# Patient Record
Sex: Female | Born: 1945 | Race: White | Hispanic: No | Marital: Married | Smoking: Never smoker
Health system: Southern US, Community
[De-identification: ages and names within clinical notes are randomized; demographics above are authoritative.]

## PROBLEM LIST (undated history)

## (undated) DIAGNOSIS — R42 Dizziness and giddiness: Secondary | ICD-10-CM

## (undated) DIAGNOSIS — K219 Gastro-esophageal reflux disease without esophagitis: Secondary | ICD-10-CM

## (undated) DIAGNOSIS — E669 Obesity, unspecified: Secondary | ICD-10-CM

## (undated) DIAGNOSIS — J40 Bronchitis, not specified as acute or chronic: Secondary | ICD-10-CM

## (undated) DIAGNOSIS — G4733 Obstructive sleep apnea (adult) (pediatric): Secondary | ICD-10-CM

## (undated) DIAGNOSIS — I219 Acute myocardial infarction, unspecified: Secondary | ICD-10-CM

## (undated) DIAGNOSIS — IMO0001 Reserved for inherently not codable concepts without codable children: Secondary | ICD-10-CM

## (undated) DIAGNOSIS — E785 Hyperlipidemia, unspecified: Secondary | ICD-10-CM

## (undated) DIAGNOSIS — K759 Inflammatory liver disease, unspecified: Secondary | ICD-10-CM

## (undated) DIAGNOSIS — L408 Other psoriasis: Secondary | ICD-10-CM

## (undated) DIAGNOSIS — R17 Unspecified jaundice: Secondary | ICD-10-CM

## (undated) DIAGNOSIS — K5790 Diverticulosis of intestine, part unspecified, without perforation or abscess without bleeding: Secondary | ICD-10-CM

## (undated) DIAGNOSIS — I10 Essential (primary) hypertension: Secondary | ICD-10-CM

## (undated) DIAGNOSIS — G4730 Sleep apnea, unspecified: Secondary | ICD-10-CM

## (undated) DIAGNOSIS — K649 Unspecified hemorrhoids: Secondary | ICD-10-CM

## (undated) DIAGNOSIS — Z9289 Personal history of other medical treatment: Secondary | ICD-10-CM

## (undated) DIAGNOSIS — I4719 Other supraventricular tachycardia: Secondary | ICD-10-CM

## (undated) DIAGNOSIS — H9190 Unspecified hearing loss, unspecified ear: Secondary | ICD-10-CM

## (undated) DIAGNOSIS — I2510 Atherosclerotic heart disease of native coronary artery without angina pectoris: Secondary | ICD-10-CM

## (undated) DIAGNOSIS — F32A Depression, unspecified: Secondary | ICD-10-CM

## (undated) DIAGNOSIS — M1990 Unspecified osteoarthritis, unspecified site: Secondary | ICD-10-CM

## (undated) DIAGNOSIS — R011 Cardiac murmur, unspecified: Secondary | ICD-10-CM

## (undated) DIAGNOSIS — I471 Supraventricular tachycardia: Secondary | ICD-10-CM

## (undated) HISTORY — DX: Diverticulosis of intestine, part unspecified, without perforation or abscess without bleeding: K57.90

## (undated) HISTORY — DX: Unspecified osteoarthritis, unspecified site: M19.90

## (undated) HISTORY — DX: Obesity, unspecified: E66.9

## (undated) HISTORY — PX: ABDOMINAL HYSTERECTOMY: SHX81

## (undated) HISTORY — DX: Hyperlipidemia, unspecified: E78.5

## (undated) HISTORY — DX: Other psoriasis: L40.8

## (undated) HISTORY — DX: Sleep apnea, unspecified: G47.30

## (undated) HISTORY — DX: Depression, unspecified: F32.A

## (undated) HISTORY — DX: Bronchitis, not specified as acute or chronic: J40

## (undated) HISTORY — DX: Dizziness and giddiness: R42

## (undated) HISTORY — DX: Cardiac murmur, unspecified: R01.1

## (undated) HISTORY — PX: TUBAL LIGATION: SHX77

## (undated) HISTORY — PX: BACK SURGERY: SHX140

## (undated) HISTORY — PX: UPPER GASTROINTESTINAL ENDOSCOPY: SHX188

## (undated) HISTORY — DX: Gastro-esophageal reflux disease without esophagitis: K21.9

## (undated) HISTORY — PX: KNEE SURGERY: SHX244

## (undated) HISTORY — PX: CORONARY ANGIOPLASTY: SHX604

## (undated) HISTORY — PX: COLONOSCOPY: SHX174

## (undated) HISTORY — DX: Essential (primary) hypertension: I10

## (undated) HISTORY — PX: REPAIR RECTOCELE: SUR1206

## (undated) HISTORY — DX: Unspecified jaundice: R17

## (undated) HISTORY — DX: Unspecified hemorrhoids: K64.9

## (undated) HISTORY — DX: Personal history of other medical treatment: Z92.89

## (undated) HISTORY — DX: Atherosclerotic heart disease of native coronary artery without angina pectoris: I25.10

## (undated) HISTORY — PX: CATARACT EXTRACTION: SUR2

## (undated) HISTORY — DX: Obstructive sleep apnea (adult) (pediatric): G47.33

## (undated) SURGERY — SUPRAVENTRICULAR TACHYCARDIA ABLATION
Anesthesia: Moderate Sedation | Laterality: Bilateral

---

## 1999-07-25 ENCOUNTER — Encounter: Admission: RE | Admit: 1999-07-25 | Discharge: 1999-07-25 | Payer: Self-pay | Admitting: Obstetrics and Gynecology

## 1999-07-25 ENCOUNTER — Encounter: Payer: Self-pay | Admitting: Obstetrics and Gynecology

## 2000-09-03 ENCOUNTER — Encounter: Payer: Self-pay | Admitting: Obstetrics and Gynecology

## 2000-09-03 ENCOUNTER — Other Ambulatory Visit: Admission: RE | Admit: 2000-09-03 | Discharge: 2000-09-03 | Payer: Self-pay | Admitting: Obstetrics and Gynecology

## 2000-09-03 ENCOUNTER — Encounter: Admission: RE | Admit: 2000-09-03 | Discharge: 2000-09-03 | Payer: Self-pay | Admitting: Obstetrics and Gynecology

## 2001-09-30 ENCOUNTER — Encounter: Admission: RE | Admit: 2001-09-30 | Discharge: 2001-09-30 | Payer: Self-pay | Admitting: Obstetrics and Gynecology

## 2001-09-30 ENCOUNTER — Encounter: Payer: Self-pay | Admitting: Obstetrics and Gynecology

## 2001-10-07 ENCOUNTER — Other Ambulatory Visit: Admission: RE | Admit: 2001-10-07 | Discharge: 2001-10-07 | Payer: Self-pay | Admitting: Obstetrics and Gynecology

## 2002-10-13 ENCOUNTER — Encounter: Payer: Self-pay | Admitting: Obstetrics and Gynecology

## 2002-10-13 ENCOUNTER — Encounter: Admission: RE | Admit: 2002-10-13 | Discharge: 2002-10-13 | Payer: Self-pay | Admitting: Obstetrics and Gynecology

## 2003-10-19 ENCOUNTER — Encounter: Admission: RE | Admit: 2003-10-19 | Discharge: 2003-10-19 | Payer: Self-pay | Admitting: Obstetrics and Gynecology

## 2004-02-09 ENCOUNTER — Inpatient Hospital Stay (HOSPITAL_COMMUNITY): Admission: EM | Admit: 2004-02-09 | Discharge: 2004-02-11 | Payer: Self-pay | Admitting: Emergency Medicine

## 2004-05-23 ENCOUNTER — Ambulatory Visit: Payer: Self-pay | Admitting: Family Medicine

## 2004-07-27 ENCOUNTER — Ambulatory Visit: Payer: Self-pay | Admitting: Family Medicine

## 2005-04-05 ENCOUNTER — Ambulatory Visit: Payer: Self-pay | Admitting: Family Medicine

## 2005-05-28 ENCOUNTER — Ambulatory Visit: Payer: Self-pay | Admitting: Internal Medicine

## 2005-10-29 ENCOUNTER — Encounter: Admission: RE | Admit: 2005-10-29 | Discharge: 2005-10-29 | Payer: Self-pay | Admitting: Obstetrics and Gynecology

## 2006-11-21 ENCOUNTER — Ambulatory Visit: Payer: Self-pay | Admitting: Internal Medicine

## 2006-11-25 ENCOUNTER — Encounter: Admission: RE | Admit: 2006-11-25 | Discharge: 2006-11-25 | Payer: Self-pay | Admitting: Obstetrics and Gynecology

## 2006-11-28 ENCOUNTER — Ambulatory Visit: Payer: Self-pay | Admitting: Internal Medicine

## 2007-11-25 ENCOUNTER — Encounter: Payer: Self-pay | Admitting: Internal Medicine

## 2008-02-23 ENCOUNTER — Encounter: Admission: RE | Admit: 2008-02-23 | Discharge: 2008-02-23 | Payer: Self-pay | Admitting: Obstetrics and Gynecology

## 2008-03-22 ENCOUNTER — Inpatient Hospital Stay (HOSPITAL_COMMUNITY): Admission: RE | Admit: 2008-03-22 | Discharge: 2008-03-26 | Payer: Self-pay | Admitting: Orthopedic Surgery

## 2008-04-19 ENCOUNTER — Encounter: Admission: RE | Admit: 2008-04-19 | Discharge: 2008-06-10 | Payer: Self-pay | Admitting: Orthopedic Surgery

## 2008-05-25 ENCOUNTER — Ambulatory Visit: Payer: Self-pay | Admitting: Cardiovascular Disease

## 2008-07-02 ENCOUNTER — Ambulatory Visit: Payer: Self-pay | Admitting: Cardiovascular Disease

## 2008-07-02 LAB — CONVERTED CEMR LAB
Basophils Relative: 0.8 % (ref 0.0–3.0)
Calcium: 8.9 mg/dL (ref 8.4–10.5)
Eosinophils Absolute: 0.2 10*3/uL (ref 0.0–0.7)
Eosinophils Relative: 3.6 % (ref 0.0–5.0)
GFR calc Af Amer: 109 mL/min
GFR calc non Af Amer: 90 mL/min
Glucose, Bld: 138 mg/dL — ABNORMAL HIGH (ref 70–99)
HCT: 38 % (ref 36.0–46.0)
Hemoglobin: 13.1 g/dL (ref 12.0–15.0)
INR: 1 (ref 0.8–1.0)
Monocytes Absolute: 0.3 10*3/uL (ref 0.1–1.0)
Monocytes Relative: 6.2 % (ref 3.0–12.0)
Neutro Abs: 3 10*3/uL (ref 1.4–7.7)
Neutrophils Relative %: 57.7 % (ref 43.0–77.0)
Potassium: 3.3 meq/L — ABNORMAL LOW (ref 3.5–5.1)
Prothrombin Time: 11.2 s (ref 10.9–13.3)
RBC: 4.11 M/uL (ref 3.87–5.11)
Sodium: 141 meq/L (ref 135–145)
WBC: 5.1 10*3/uL (ref 4.5–10.5)

## 2008-07-12 ENCOUNTER — Ambulatory Visit: Payer: Self-pay | Admitting: Cardiovascular Disease

## 2008-07-12 LAB — CONVERTED CEMR LAB
CO2: 31 meq/L (ref 19–32)
Calcium: 9.3 mg/dL (ref 8.4–10.5)
Chloride: 106 meq/L (ref 96–112)
Creatinine, Ser: 0.5 mg/dL (ref 0.4–1.2)
GFR calc non Af Amer: 132 mL/min
Sodium: 142 meq/L (ref 135–145)

## 2008-10-18 ENCOUNTER — Encounter: Payer: Self-pay | Admitting: Internal Medicine

## 2008-12-09 ENCOUNTER — Observation Stay (HOSPITAL_COMMUNITY): Admission: EM | Admit: 2008-12-09 | Discharge: 2008-12-13 | Payer: Self-pay | Admitting: Emergency Medicine

## 2008-12-09 ENCOUNTER — Ambulatory Visit: Payer: Self-pay | Admitting: Internal Medicine

## 2008-12-09 DIAGNOSIS — B379 Candidiasis, unspecified: Secondary | ICD-10-CM | POA: Insufficient documentation

## 2008-12-09 DIAGNOSIS — E785 Hyperlipidemia, unspecified: Secondary | ICD-10-CM | POA: Insufficient documentation

## 2008-12-09 DIAGNOSIS — I152 Hypertension secondary to endocrine disorders: Secondary | ICD-10-CM | POA: Insufficient documentation

## 2008-12-09 DIAGNOSIS — R011 Cardiac murmur, unspecified: Secondary | ICD-10-CM | POA: Insufficient documentation

## 2008-12-09 DIAGNOSIS — R42 Dizziness and giddiness: Secondary | ICD-10-CM | POA: Insufficient documentation

## 2008-12-09 DIAGNOSIS — I10 Essential (primary) hypertension: Secondary | ICD-10-CM

## 2008-12-09 DIAGNOSIS — K649 Unspecified hemorrhoids: Secondary | ICD-10-CM | POA: Insufficient documentation

## 2008-12-09 DIAGNOSIS — R55 Syncope and collapse: Secondary | ICD-10-CM | POA: Insufficient documentation

## 2008-12-09 DIAGNOSIS — I2510 Atherosclerotic heart disease of native coronary artery without angina pectoris: Secondary | ICD-10-CM | POA: Insufficient documentation

## 2008-12-09 DIAGNOSIS — J40 Bronchitis, not specified as acute or chronic: Secondary | ICD-10-CM | POA: Insufficient documentation

## 2008-12-09 DIAGNOSIS — M1990 Unspecified osteoarthritis, unspecified site: Secondary | ICD-10-CM | POA: Insufficient documentation

## 2008-12-09 DIAGNOSIS — L408 Other psoriasis: Secondary | ICD-10-CM | POA: Insufficient documentation

## 2008-12-09 DIAGNOSIS — R17 Unspecified jaundice: Secondary | ICD-10-CM | POA: Insufficient documentation

## 2008-12-09 DIAGNOSIS — E1159 Type 2 diabetes mellitus with other circulatory complications: Secondary | ICD-10-CM | POA: Insufficient documentation

## 2008-12-09 DIAGNOSIS — K219 Gastro-esophageal reflux disease without esophagitis: Secondary | ICD-10-CM | POA: Insufficient documentation

## 2008-12-09 HISTORY — DX: Dizziness and giddiness: R42

## 2008-12-10 ENCOUNTER — Encounter: Payer: Self-pay | Admitting: Cardiovascular Disease

## 2008-12-13 ENCOUNTER — Ambulatory Visit: Payer: Self-pay | Admitting: Vascular Surgery

## 2008-12-13 ENCOUNTER — Encounter: Payer: Self-pay | Admitting: Cardiovascular Disease

## 2008-12-13 ENCOUNTER — Ambulatory Visit: Payer: Self-pay | Admitting: Internal Medicine

## 2008-12-17 ENCOUNTER — Telehealth: Payer: Self-pay | Admitting: Internal Medicine

## 2008-12-22 ENCOUNTER — Ambulatory Visit: Payer: Self-pay | Admitting: Internal Medicine

## 2008-12-22 ENCOUNTER — Encounter: Payer: Self-pay | Admitting: Cardiology

## 2009-01-31 ENCOUNTER — Encounter: Payer: Self-pay | Admitting: Internal Medicine

## 2009-02-21 ENCOUNTER — Encounter: Payer: Self-pay | Admitting: Internal Medicine

## 2009-02-28 ENCOUNTER — Ambulatory Visit: Payer: Self-pay | Admitting: Internal Medicine

## 2009-02-28 DIAGNOSIS — R609 Edema, unspecified: Secondary | ICD-10-CM

## 2009-02-28 DIAGNOSIS — I10 Essential (primary) hypertension: Secondary | ICD-10-CM | POA: Insufficient documentation

## 2009-02-28 HISTORY — DX: Edema, unspecified: R60.9

## 2009-03-07 ENCOUNTER — Ambulatory Visit: Payer: Self-pay | Admitting: Internal Medicine

## 2009-03-07 ENCOUNTER — Encounter: Payer: Self-pay | Admitting: Internal Medicine

## 2009-03-07 LAB — CONVERTED CEMR LAB
CO2: 28 meq/L (ref 19–32)
Calcium: 9.7 mg/dL (ref 8.4–10.5)
Chloride: 109 meq/L (ref 96–112)

## 2009-03-14 ENCOUNTER — Telehealth: Payer: Self-pay | Admitting: Internal Medicine

## 2009-03-21 ENCOUNTER — Encounter: Admission: RE | Admit: 2009-03-21 | Discharge: 2009-03-21 | Payer: Self-pay | Admitting: Obstetrics and Gynecology

## 2009-04-18 ENCOUNTER — Encounter: Payer: Self-pay | Admitting: Internal Medicine

## 2009-04-29 ENCOUNTER — Telehealth (INDEPENDENT_AMBULATORY_CARE_PROVIDER_SITE_OTHER): Payer: Self-pay | Admitting: *Deleted

## 2009-09-06 ENCOUNTER — Emergency Department (HOSPITAL_COMMUNITY): Admission: EM | Admit: 2009-09-06 | Discharge: 2009-09-07 | Payer: Self-pay | Admitting: Emergency Medicine

## 2009-09-12 ENCOUNTER — Encounter: Payer: Self-pay | Admitting: Internal Medicine

## 2009-09-12 ENCOUNTER — Telehealth (INDEPENDENT_AMBULATORY_CARE_PROVIDER_SITE_OTHER): Payer: Self-pay | Admitting: *Deleted

## 2009-09-14 ENCOUNTER — Telehealth: Payer: Self-pay | Admitting: Internal Medicine

## 2009-09-15 ENCOUNTER — Ambulatory Visit (HOSPITAL_COMMUNITY): Admission: RE | Admit: 2009-09-15 | Discharge: 2009-09-17 | Payer: Self-pay | Admitting: Orthopedic Surgery

## 2009-10-26 ENCOUNTER — Encounter: Admission: RE | Admit: 2009-10-26 | Discharge: 2010-01-24 | Payer: Self-pay | Admitting: Orthopedic Surgery

## 2009-11-21 ENCOUNTER — Telehealth (INDEPENDENT_AMBULATORY_CARE_PROVIDER_SITE_OTHER): Payer: Self-pay | Admitting: *Deleted

## 2010-01-09 ENCOUNTER — Encounter: Payer: Self-pay | Admitting: Internal Medicine

## 2010-02-06 ENCOUNTER — Encounter: Payer: Self-pay | Admitting: Internal Medicine

## 2010-02-21 ENCOUNTER — Ambulatory Visit: Payer: Self-pay | Admitting: Internal Medicine

## 2010-02-21 DIAGNOSIS — R0602 Shortness of breath: Secondary | ICD-10-CM | POA: Insufficient documentation

## 2010-02-23 ENCOUNTER — Telehealth (INDEPENDENT_AMBULATORY_CARE_PROVIDER_SITE_OTHER): Payer: Self-pay | Admitting: Radiology

## 2010-02-27 ENCOUNTER — Ambulatory Visit: Payer: Self-pay | Admitting: Internal Medicine

## 2010-02-27 ENCOUNTER — Encounter (HOSPITAL_COMMUNITY): Admission: RE | Admit: 2010-02-27 | Discharge: 2010-04-11 | Payer: Self-pay | Admitting: Internal Medicine

## 2010-02-27 ENCOUNTER — Encounter: Payer: Self-pay | Admitting: Internal Medicine

## 2010-02-27 ENCOUNTER — Ambulatory Visit: Payer: Self-pay

## 2010-03-15 ENCOUNTER — Ambulatory Visit: Payer: Self-pay | Admitting: Pulmonary Disease

## 2010-03-15 DIAGNOSIS — G4733 Obstructive sleep apnea (adult) (pediatric): Secondary | ICD-10-CM | POA: Insufficient documentation

## 2010-03-20 ENCOUNTER — Ambulatory Visit (HOSPITAL_BASED_OUTPATIENT_CLINIC_OR_DEPARTMENT_OTHER): Admission: RE | Admit: 2010-03-20 | Discharge: 2010-03-20 | Payer: Self-pay | Admitting: Pulmonary Disease

## 2010-03-20 ENCOUNTER — Encounter: Payer: Self-pay | Admitting: Pulmonary Disease

## 2010-03-22 ENCOUNTER — Encounter: Admission: RE | Admit: 2010-03-22 | Discharge: 2010-03-22 | Payer: Self-pay | Admitting: Obstetrics and Gynecology

## 2010-03-24 ENCOUNTER — Encounter: Admission: RE | Admit: 2010-03-24 | Discharge: 2010-03-24 | Payer: Self-pay | Admitting: Obstetrics and Gynecology

## 2010-04-06 ENCOUNTER — Ambulatory Visit: Payer: Self-pay | Admitting: Pulmonary Disease

## 2010-04-13 ENCOUNTER — Ambulatory Visit: Payer: Self-pay | Admitting: Pulmonary Disease

## 2010-07-27 ENCOUNTER — Encounter: Payer: Self-pay | Admitting: Internal Medicine

## 2010-08-22 NOTE — Assessment & Plan Note (Signed)
Summary: f79m/jss   Visit Type:  Follow-up Primary Provider:  Meryle Ready  CC:  no complaints.  History of Present Illness: 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  She returns for routine f/u. Says she is doing pretty well. Notes that she as been having some exertional dyspnea for past 3-4 month which she feels is somewhat worse from before. No CP or tightness.  No orthopnea, PND or edema. lost 23 pounds over past year.  Still taking Plavix. Bruising a lot.   Not exercising regularly. Snores excessively and wakes her self up. Very fatigued.  TC 163 LDL 97 HDL 44 TG 65 Lp (a) very high at 20 pro-BNP 95   Current Medications (verified): 1)  Plavix 75 Mg Tabs (Clopidogrel Bisulfate) .... Take One Tablet By Mouth Daily 2)  Aspirin 81 Mg Tbec (Aspirin) .... Take One Tablet By Mouth Daily 3)  Crestor 20 Mg Tabs (Rosuvastatin Calcium) .... Take One Tablet By Mouth Daily. 4)  Fish Oil   Oil (Fish Oil) .... 2 Tabs Once Daily 5)  Calcium Carbonate   Powd (Calcium Carbonate) .... With Vit D Once Daily 6)  Nitroglycerin 0.4 Mg Subl (Nitroglycerin) .... Place 1 Tablet Under Tongue As Directed 7)  Metoprolol Tartrate 25 Mg Tabs (Metoprolol Tartrate) .... 1/2 By Mouth Two Times A Day 8)  Klor-Con M10 10 Meq Cr-Tabs (Potassium Chloride Crys Cr) .... Take 1 Tablet By Mouth Once A Day 9)  Spironolactone 25 Mg Tabs (Spironolactone) .... Take One Tablet By Mouth Daily 10)  Lisinopril 20 Mg Tabs (Lisinopril) .... Take One  Tablet By Mouth Daily  Allergies (verified): 1)  ! Procardia 2)  ! Codeine  Past History:  Past Medical History: 1. Hypertension. 2. Hyperlipidemia. 3. Gastroesophageal reflux disease. 4. Coronary artery disease     catheterization 10/09 revealing significant stenoses in the LAD.     Subsequent stent placement with 3 XIENCE drug-eluting stents in the  proximal, mid, and distal LAD.  The proximal LAD had a 3.5- x 8-mm     XIENCE stent placed, the mid LAD had a 3.0- x 8-mm XIENCE stent     placed, and the distal LAD had a 2.75- x 15-mm XIENCE stent placed 5. Syncope - evaluated by Dr. Ladona Ridgel (EP)      --work-up negative. ? neuro-cardiogenic 6. Morbid obesity MEASLES, HX OF (ICD-V12.09) CANDIDIASIS (ICD-112.9) PSORIASIS (ICD-696.1) ARTHRITIS (ICD-716.90) HEMORRHOIDS (ICD-455.6) JAUNDICE (ICD-782.4) DIVERTICULOSIS OF COLON (ICD-562.10) BRONCHITIS (ICD-490) VERTIGO (ICD-780.4) GERD (ICD-530.81)  Review of Systems       As per HPI and past medical history; otherwise all systems negative.   Vital Signs:  Patient profile:   65 year old female Height:      65 inches Weight:      226 pounds BMI:     37.74 O2 Sat:      95 % on Room air Pulse rate:   60 / minute BP sitting:   122 / 74  (right arm) Cuff size:   regular  Vitals Entered By: Hardin Negus, RMA (February 21, 2010 11:16 AM)  O2 Flow:  Room air CC: no complaints Comments 2 laps around office O2 stats up to 98 with HR of 96   Physical Exam  General:  Well appearing. no resp difficulty HEENT: normal Neck: supple. no JVD. Carotids 2+ bilat; no bruits. No lymphadenopathy or thryomegaly appreciated. Cor:  PMI nondisplaced. Regular rate & rhythm. No rubs, gallops. soft systolic murmur at RSB. s2 crisp Lungs: clear Abdomen: obese. soft, nontender, nondistended. Good bowel sounds. Extremities: no cyanosis, clubbing, rash. no edema Neuro: alert & orientedx3, cranial nerves grossly intact. moves all 4 extremities w/o difficulty. affect pleasant    Impression & Recommendations:  Problem # 1:  CAD, NATIVE VESSEL (ICD-414.01) Having a bit more dyspnea. Given previous stenting and ECG changes (which may be repol) will proceed with ETT/myoview to further evalaute. Counseled oo need for more exercise. Given number of LAD stents we will continue Plavix for now.  Problem # 2:   HYPERLIPIDEMIA (ICD-272.4) LDL not at goal. Increase Crestor 40 once daily. Can consider treating Lp (a) with Niacin though not much outcomes data to support this approach.  Problem # 3:  HYPERTENSION, BENIGN (ICD-401.1) Blood pressure well controlled. Continue current regimen.  Problem # 4:  SNORING Will arrange for sleep study.   Other Orders: EKG w/ Interpretation (93000) Pulmonary Referral (Pulmonary) Nuclear Stress Test (Nuc Stress Test)  Patient Instructions: 1)  Increase Crestor to 40mg  daily (a new prescription has been sent into [facility]) 2)  Your physician has requested that you have an exercise stress myoview.  For further information please visit [email or web address]  Please follow instruction sheet, as given. 3)  You have been referred to Pulmonary 4)  Your physician wants you to follow-up in:  6 months.  You will receive a reminder letter in the mail two months in advance. If you don't receive a letter, please call our office to schedule the follow-up appointment. Prescriptions: CRESTOR 40 MG TABS (ROSUVASTATIN CALCIUM) Take one tablet by mouth daily.  #30 x 6   Entered by:   Meredith Staggers, RN   Authorized by:   Dolores Patty, MD, [facility]   Signed by:   Meredith Staggers, RN on 02/21/2010   Method used:   Faxed to ...       [facility]       [address]       [place], [place]  [address]       Ph: [phone] or [phone]       Fax: [phone]   RxID:   [id number]

## 2010-08-22 NOTE — Letter (Signed)
Summary: [facility] Surgical Clearance  [facility]   Imported By: Roderic Ovens 09/22/2009 11:30:16  _____________________________________________________________________  External Attachment:    Type:   Image     Comment:   External Document

## 2010-08-22 NOTE — Assessment & Plan Note (Signed)
Summary: consult for possible osa   Copy to:  Arvilla Meres Primary Provider/Referring Provider:  Samuel Jester [facility])  CC:  Sleep Consult.  History of Present Illness: The pt is a 65y/o female who I have been asked to see for possible osa.  She has been noted to have loud snoring, as well as an abnormal breathing pattern during sleep.  She has also noted occasional choking arousals.  She goes to bed at 10pm, and arises at 7:30 to 9am to start her day.  She has frequent awakenings during the night, and is unrested at least 50% of the time upon awakening.  She notes definite sleep pressure with periods of inactivity during the day, and may doze at times.  She will also fall asleep easily with tv in the evenings.  She has no issues with sleepiness driving.  Her epworth score today is high at 18, and she tells me that her weight has decreased by 23 pounds over the last one year.    Current Medications (verified): 1)  Plavix 75 Mg Tabs (Clopidogrel Bisulfate) .... Take One Tablet By Mouth Daily 2)  Aspirin 81 Mg Tbec (Aspirin) .... Take One Tablet By Mouth Daily 3)  Crestor 40 Mg Tabs (Rosuvastatin Calcium) .... Take One Tablet By Mouth Daily. 4)  Fish Oil   Oil (Fish Oil) .... 2 Tabs Once Daily 5)  Calcium Carbonate   Powd (Calcium Carbonate) .... With Vit D Once Daily 6)  Nitroglycerin 0.4 Mg Subl (Nitroglycerin) .... Place 1 Tablet Under Tongue As Directed 7)  Metoprolol Tartrate 25 Mg Tabs (Metoprolol Tartrate) .... 1/2 By Mouth Two Times A Day 8)  Klor-Con M10 10 Meq Cr-Tabs (Potassium Chloride Crys Cr) .... Take 1 Tablet By Mouth Once A Day 9)  Spironolactone 25 Mg Tabs (Spironolactone) .... Take One Tablet By Mouth Daily 10)  Lisinopril 20 Mg Tabs (Lisinopril) .... Take One  Tablet By Mouth Daily 11)  Amoxicillin 500 Mg Caps (Amoxicillin) .... Take 1 Tab By Mouth Every 8 Hours 12)  Norel Sr 8-50-40-325 Mg Xr12h-Tab (Chlorphen-Phenyltolox-Pe-Apap) .... Take 1 Tab By Mouth Every 12  Hours  Allergies (verified): 1)  ! Procardia 2)  ! Codeine  Past History:  Past Medical History: Reviewed history from 02/21/2010 and no changes required. 1. Hypertension. 2. Hyperlipidemia. 3. Gastroesophageal reflux disease. 4. Coronary artery disease     catheterization 10/09 revealing significant stenoses in the LAD.     Subsequent stent placement with 3 XIENCE drug-eluting stents in the     proximal, mid, and distal LAD.  The proximal LAD had a 3.5- x 8-mm     XIENCE stent placed, the mid LAD had a 3.0- x 8-mm XIENCE stent     placed, and the distal LAD had a 2.75- x 15-mm XIENCE stent placed 5. Syncope - evaluated by Dr. Ladona Ridgel (EP)      --work-up negative. ? neuro-cardiogenic 6. Morbid obesity MEASLES, HX OF (ICD-V12.09) CANDIDIASIS (ICD-112.9) PSORIASIS (ICD-696.1) ARTHRITIS (ICD-716.90) HEMORRHOIDS (ICD-455.6) JAUNDICE (ICD-782.4) DIVERTICULOSIS OF COLON (ICD-562.10) BRONCHITIS (ICD-490) VERTIGO (ICD-780.4) GERD (ICD-530.81)  Past Surgical History: Tubal ligation approx 1970s , hysterectomy.  approx 1970s R knee surgery 2009 stent x 3  2009 back surgery 2011     Family History: Reviewed history from 12/09/2008 and no changes required.   Father with history abdominal aneurysm.   Mother stroke,  hypertension, congestive heart failure.   Brother with seizures.   Daughter with muscular dystrophy.   Social History: Reviewed history from 12/09/2008 and  no changes required. 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  never smoked. .  No alcohol.   Two  living children, 1 deceased.   Husband will be assistive care after  surgery.   Review of Systems       The patient complains of shortness of breath with activity, shortness of breath at rest, productive cough, and nasal congestion/difficulty breathing through nose.  The patient denies non-productive cough, coughing up blood, chest pain, irregular heartbeats, acid heartburn, indigestion, loss of appetite, weight  change, abdominal pain, difficulty swallowing, sore throat, tooth/dental problems, headaches, sneezing, itching, ear ache, anxiety, depression, hand/feet swelling, joint stiffness or pain, rash, change in color of mucus, and fever.    Vital Signs:  Patient profile:   65 year old female Height:      65 inches Weight:      227.50 pounds BMI:     37.99 O2 Sat:      93 % on Room air Temp:     98.1 degrees F oral Pulse rate:   54 / minute BP sitting:   116 / 60  (left arm) Cuff size:   regular  Vitals Entered By: Arman Filter LPN (March 15, 2010 1:25 PM)  O2 Flow:  Room air CC: Sleep Consult Comments Medications reviewed with patient Arman Filter LPN  March 15, 2010 1:25 PM    Physical Exam  General:  obese female in nad Eyes:  PERRLA and EOMI.   Nose:  swollen turbinates, no purulence  narrowed bilat. Mouth:  mild elongation of soft palate and uvula Neck:  no jvd, tmg, LN Lungs:  clear to auscultation Heart:  rrr, 2/6 sem, no gallops or rubs. Abdomen:  soft and nontender, bs+ Extremities:  no edema or cyanosis  pulses intact distally Neurologic:  alert and oriented, moves all 4.   Impression & Recommendations:  Problem # 1:  OBSTRUCTIVE SLEEP APNEA (ICD-327.23) The pt's history is very suggestive of osa.  She is obese, has witnessed abnormal breathing pattern during sleep, nonrestorative sleep, and inappropriate daytime sleepiness.  She has underlying comorbid medical conditions that can be greatly affected by sleep disordered breathing.  I have had a long discussion with the pt about sleep apnea, including its impact on QOL and CV health.  I have recommended that she have a sleep study for diagnosis, and she is agreeable.  Medications Added to Medication List This Visit: 1)  Amoxicillin 500 Mg Caps (Amoxicillin) .... Take 1 tab by mouth every 8 hours 2)  Norel Sr 8-50-40-325 Mg Xr12h-tab (Chlorphen-phenyltolox-pe-apap) .... Take 1 tab by mouth every 12 hours  Other  Orders: Sleep Disorder Referral (Sleep Disorder) Consultation Level IV ([address]11)  Patient Instructions: 1)  will set up for sleep study to evaluate for sleep apnea.  Will call to schedule followup when results are available. 2)  work on weight loss

## 2010-08-22 NOTE — Miscellaneous (Signed)
  Clinical Lists Changes  Medications: Changed medication from METOPROLOL SUCCINATE 25 MG XR24H-TAB (METOPROLOL SUCCINATE) Take one half  tablet by mouth twice a day to METOPROLOL TARTRATE 25 MG TABS (METOPROLOL TARTRATE) 1/2 by mouth two times a day

## 2010-08-22 NOTE — Progress Notes (Signed)
Summary: surgical clearance  Phone Note From Other Clinic Call back at [phone]   Caller: Sherry/Dr Darrelyn Hillock Summary of Call: pt having emergency back surgery tomorrow, need clearance ASAP, please fax [phone] Initial call taken by: Migdalia Dk,  September 14, 2009 2:12 PM  Follow-up for Phone Call        Spoke with Cordelia Pen from Dr. Jeannetta Ellis office regarding pt. needing surgical clearance. Pt. is having emergency back surgery in the AM. I let Cordelia Pen now will send message to MD. Ollen Gross, RN, BSN  September 14, 2009 3:04 PM Cordelia Pen called back. She had received  the written surgical clearanse needed.  Follow-up by: Ollen Gross, RN, BSN,  September 14, 2009 3:06 PM

## 2010-08-22 NOTE — Assessment & Plan Note (Signed)
Summary: rov to discuss sleep study results/MS   Visit Type:  Follow-up Copy to:  Arvilla Meres Primary Provider/Referring Provider:  Samuel Jester [facility])  CC:  pt here to discuss sleep study results..  History of Present Illness: the pt comes in today for f/u of her recent sleep study.  She was found to have very mild osa, with AHI of 8/hr and desat to 88% transiently.  She was also noted to have 84 PLMS with no significant arousal or awakening.  I have gone over the study with her in detail, and answered all of her questions.  Her husband states that she does kick at night, but she denies any classic RLS symptoms.  She does "shake" her leg while sitting in the evening, but believes it is habit.  She has had a total knee replacement on the right.  Current Medications (verified): 1)  Plavix 75 Mg Tabs (Clopidogrel Bisulfate) .... Take One Tablet By Mouth Daily 2)  Aspirin 81 Mg Tbec (Aspirin) .... Take One Tablet By Mouth Daily 3)  Crestor 40 Mg Tabs (Rosuvastatin Calcium) .... Take One Tablet By Mouth Daily. 4)  Fish Oil   Oil (Fish Oil) .... 2 Tabs Once Daily 5)  Calcium Carbonate   Powd (Calcium Carbonate) .... With Vit D Once Daily 6)  Nitroglycerin 0.4 Mg Subl (Nitroglycerin) .... Place 1 Tablet Under Tongue As Directed 7)  Metoprolol Tartrate 25 Mg Tabs (Metoprolol Tartrate) .... 1/2 By Mouth Two Times A Day 8)  Klor-Con M10 10 Meq Cr-Tabs (Potassium Chloride Crys Cr) .... Take 1 Tablet By Mouth Once A Day 9)  Spironolactone 25 Mg Tabs (Spironolactone) .... Take One Tablet By Mouth Daily 10)  Lisinopril 20 Mg Tabs (Lisinopril) .... Take One  Tablet By Mouth Daily  Allergies (verified): 1)  ! Procardia 2)  ! Codeine  Review of Systems       The patient complains of shortness of breath with activity, irregular heartbeats, acid heartburn, nasal congestion/difficulty breathing through nose, sneezing, and hand/feet swelling.  The patient denies shortness of breath at rest,  productive cough, non-productive cough, coughing up blood, chest pain, indigestion, loss of appetite, weight change, abdominal pain, difficulty swallowing, sore throat, tooth/dental problems, headaches, itching, ear ache, anxiety, depression, joint stiffness or pain, rash, change in color of mucus, and fever.    Vital Signs:  Patient profile:   65 year old female Height:      65 inches Weight:      229.50 pounds O2 Sat:      96 % on Room air Temp:     98.3 degrees F oral Pulse rate:   64 / minute BP sitting:   128 / 64  (left arm) Cuff size:   regular  Vitals Entered By: Carver Fila (April 13, 2010 3:21 PM)  O2 Flow:  Room air CC: pt here to discuss sleep study results. Comments meds and allergies updated Phone number updated Carver Fila  April 13, 2010 3:22 PM    Physical Exam  General:  ow female in nad Extremities:  no edema or cyanosis  Neurologic:  alert, not sleepy, moves all 4.   Impression & Recommendations:  Problem # 1:  OBSTRUCTIVE SLEEP APNEA (ICD-327.23) the pt has very mild osa by her recent sleep study, and therefore represents little CV risk to her if left untreated.  I have asked the pt to make decisions about treatment based on its impact to her QOL.  A conservative treatment would be to  take 6mos and work aggressively on weight loss.  If she wished to treat this more aggressively, I would recommend either cpap or dental appliance.  At this point, she would like to take some time to work on weight loss.  She will call if she changes her mind, or if she is unable to lose weight and remains symptomatic.  Other Orders: Est. Patient Level III ([address])  Patient Instructions: 1)  work on weight loss 2)  please call if you would like to try more aggressive therapies such as cpap, dental appliance, or surgery.     Immunization History:  Influenza Immunization History:    Influenza:  historical (04/22/2009)  Pneumovax Immunization History:    Pneumovax:   historical (04/22/2008)

## 2010-08-22 NOTE — Progress Notes (Signed)
Summary: Nuc Pre-Procedure  Phone Note Outgoing Call Call back at [facility] Phone [phone]   Call placed by: Leonia Corona, RT-N,  February 23, 2010 12:43 PM Call placed to: Patient Reason for Call: Confirm/change Appt Summary of Call: Reviewed information on Myoview Information Sheet (see scanned document for further details).  Spoke with patient's husband. Initial call taken by: Leonia Corona, RT-N,  February 23, 2010 12:44 PM     Nuclear Med Background Indications for Stress Test: Evaluation for Ischemia, Stent Patency   History: Echo, Heart Catheterization, Myocardial Infarction, Myocardial Perfusion Study, Stents  History Comments: 2005- Nl. MPS 2009- MI- Cath w/ Stents-LAD 5/10- Nl. Echo  Symptoms: DOE, Fatigue    Nuclear Pre-Procedure Cardiac Risk Factors: Hypertension, Lipids Height (in): 65

## 2010-08-22 NOTE — Progress Notes (Signed)
°  Recieved "[employer]" form s back on my desk this Morning I will forward back to [facility]  April 29, 2009 8:55 AM

## 2010-08-22 NOTE — Progress Notes (Signed)
  DDS Request recieved sent to [facility]  Sara [name]  Nov 21, 2009 11:07 AM   

## 2010-08-22 NOTE — Progress Notes (Signed)
  Phone Note From Other Clinic   Caller: [facility] pre-Surgical Details for Reason: Pt.Information Initial call taken by: KM    Faxed all Cardiac over to [facility] @ [phone] [facility]  September 12, 2009 10:13 AM

## 2010-08-22 NOTE — Assessment & Plan Note (Signed)
Summary: Cardiology Nuclear Testing  Nuclear Med Background Indications for Stress Test: Evaluation for Ischemia, Stent Patency   History: Echo, Heart Catheterization, Myocardial Infarction, Myocardial Perfusion Study, Stents  History Comments: '05 BTD:VVOHYW; '09 MI>Stents-LAD; 5/10 Echo:normal LVF  Symptoms: Chest Tightness, Chest Tightness with Exertion, DOE, Fatigue  Symptoms Comments: Last episode of CP:74-months ago.   Nuclear Pre-Procedure Cardiac Risk Factors: Hypertension, Lipids, Obesity Caffeine/Decaff Intake: None NPO After: 8:30 PM Lungs: Clear IV 0.9% NS with Angio Cath: 20g     IV Site: (R) Wrist IV Started by: Irean Hong RN Chest Size (in) 42     Cup Size DD     Height (in): 65 Weight (lb): 226 BMI: 37.74 Tech Comments: Held metoprolol x 48 hours.  Nuclear Med Study 1 or 2 day study:  1 day     Stress Test Type:  Stress Reading MD:  Dietrich Pates, MD     Referring MD:  Arvilla Meres, MD Resting Radionuclide:  Technetium 55m Tetrofosmin     Resting Radionuclide Dose:  11.0 mCi  Stress Radionuclide:  Technetium 82m Tetrofosmin     Stress Radionuclide Dose:  33.0 mCi   Stress Protocol Exercise Time (min):  6:30 min     Max HR:  141 bpm     Predicted Max HR:  156 bpm  Max Systolic BP: 191 mm Hg     Percent Max HR:  90.38 %     METS: 7.2 Rate Pressure Product:  73710    Stress Test Technologist:  [employer] CMA-N     Nuclear Technologist:  Harlow Asa CNMT  Rest Procedure  Myocardial perfusion imaging was performed at rest 45 minutes following the intravenous administration of Myoview Technetium 68m Tetrofosmin.  Stress Procedure  The patient exercised for 6:30.  The patient stopped due to fatigue and denied any chest pain.  There were no significant ST-T wave changes, only occasional PAC's.  Myoview was injected at peak exercise and myocardial perfusion imaging was performed after a brief delay.  QPS Raw Data Images:  Soft tissue (diaphragm, breast)  surround heart. Stress Images:  mild thinning noted in the inferolateral wall (base).  Otherwise normal perfusion. Rest Images:  Comparison with the stress images reveals no significant change. Transient Ischemic Dilatation:  .79  (Normal <1.22)  Lung/Heart Ratio:  .31  (Normal <0.45)  Quantitative Gated Spect Images QGS EDV:  94 ml QGS ESV:  31 ml QGS EF:  67 %   Overall Impression  Exercise Capacity: Good exercise capacity. BP Response: Normal blood pressure response. Clinical Symptoms: No chest pain ECG Impression: Nondiagnositc because of baseline conduction abnormality. Overall Impression: Probable normal perfusion and minimal soft tissue attenuation (diaphragm).  no signficant ischemia or scar.  Appended Document: Cardiology Nuclear Testing ok. continue medical rx.  Appended Document: Cardiology Nuclear Testing mail box full, unable to leave a mess  Appended Document: Cardiology Nuclear Testing pt aware

## 2010-09-14 ENCOUNTER — Encounter: Payer: Self-pay | Admitting: Internal Medicine

## 2010-09-14 ENCOUNTER — Ambulatory Visit (INDEPENDENT_AMBULATORY_CARE_PROVIDER_SITE_OTHER): Payer: Medicare Other | Admitting: Internal Medicine

## 2010-09-14 DIAGNOSIS — I10 Essential (primary) hypertension: Secondary | ICD-10-CM

## 2010-09-14 DIAGNOSIS — I2510 Atherosclerotic heart disease of native coronary artery without angina pectoris: Secondary | ICD-10-CM

## 2010-09-14 DIAGNOSIS — E785 Hyperlipidemia, unspecified: Secondary | ICD-10-CM

## 2010-09-19 NOTE — Assessment & Plan Note (Signed)
Summary: per check out/sf   Visit Type:  Follow-up Referring Provider:  Arvilla Meres Primary Provider:  Samuel Jester [facility])  CC:  no complaints.  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  She returns for routine f/u. Since we last saw her had a nuclear study and sleep study.  [facility] 02/27/10: EF 67% no ischemia  Sleep study: Very mild OSA (AHI 7). Treating with weight loss. Exercising 4x/week with senior aerobics.   Says she is doing very well.  No DOE or CP.  No orthopnea, PND or edema.  Still taking Plavix. Bruising a lot. No HF symptoms. No syncope.  At last visit increased Crestor to 40. Recent lipids.    Most recent lipids TC 198 LDL 128 TG 62 TG 52 (up from previous below). Not watching diet closely.   TC 163 LDL 97 HDL 44 TG 65 Lp (a) very high at 20 pro-BNP 95   Problems Prior to Update: 1)  Obstructive Sleep Apnea  (ICD-327.23) 2)  Dyspnea  (ICD-786.05) 3)  Edema  (ICD-782.3) 4)  Cad, Native Vessel  (ICD-414.01) 5)  Hypertension, Benign  (ICD-401.1) 6)  Edema  (ICD-782.3) 7)  Cad  (ICD-414.00) 8)  Syncope  (ICD-780.2) 9)  Cardiac Murmur  (ICD-785.2) 10)  Mumps  (ICD-072.9) 11)  Measles, Hx of  (ICD-V12.09) 12)  Candidiasis  (ICD-112.9) 13)  Psoriasis  (ICD-696.1) 14)  Arthritis  (ICD-716.90) 15)  Hemorrhoids  (ICD-455.6) 16)  Jaundice  (ICD-782.4) 17)  Diverticulosis of Colon  (ICD-562.10) 18)  Bronchitis  (ICD-490) 19)  Vertigo  (ICD-780.4) 20)  Hyperlipidemia  (ICD-272.4) 21)  Hypertension  (ICD-401.9) 22)  Gerd  (ICD-530.81)  Current Medications (verified): 1)  Plavix 75 Mg Tabs (Clopidogrel Bisulfate) .... Take One Tablet By Mouth Daily 2)  Aspirin 81 Mg Tbec (Aspirin) .... Take One Tablet By Mouth Daily 3)  Crestor 40 Mg Tabs (Rosuvastatin Calcium) .... Take One Tablet By Mouth Daily. 4)  Fish Oil    Oil (Fish Oil) .... 2 Tabs Once Daily 5)  Calcium Carbonate   Powd (Calcium Carbonate) .... With Vit D Once Daily 6)  Nitroglycerin 0.4 Mg Subl (Nitroglycerin) .... Place 1 Tablet Under Tongue As Directed 7)  Metoprolol Tartrate 25 Mg Tabs (Metoprolol Tartrate) .... 1/2 By Mouth Two Times A Day 8)  Klor-Con M10 10 Meq Cr-Tabs (Potassium Chloride Crys Cr) .... Take 1 Tablet By Mouth Once A Day 9)  Spironolactone 25 Mg Tabs (Spironolactone) .... Take One Tablet By Mouth Daily 10)  Lisinopril 20 Mg Tabs (Lisinopril) .... Take One  Tablet By Mouth Daily 11)  Vitamin D3 2000 Unit Caps (Cholecalciferol) .... Take 1 Tablet By Mouth Once A Day  Allergies (verified): 1)  ! Procardia 2)  ! Codeine  Past History:  Past Medical History: 1. Hypertension. 2. Hyperlipidemia. 3. Gastroesophageal reflux disease. 4. Coronary artery disease     catheterization 10/09 revealing significant stenoses in the LAD.     Subsequent stent placement with 3 XIENCE drug-eluting stents in the     proximal, mid, and distal LAD.  The proximal LAD had a 3.5- x 8-mm     XIENCE stent placed, the mid LAD had a 3.0- x 8-mm XIENCE stent     placed, and the distal LAD had a 2.75- x 15-mm XIENCE stent placed 5. Syncope - evaluated by Dr. Ladona Ridgel (EP)      --  work-up negative. ? neuro-cardiogenic 6. Morbid obesity 7. Mild OSA AHI = 7 MEASLES, HX OF (ICD-V12.09) CANDIDIASIS (ICD-112.9) PSORIASIS (ICD-696.1) ARTHRITIS (ICD-716.90) HEMORRHOIDS (ICD-455.6) JAUNDICE (ICD-782.4) DIVERTICULOSIS OF COLON (ICD-562.10) BRONCHITIS (ICD-490) VERTIGO (ICD-780.4) GERD (ICD-530.81)  Review of Systems       As per HPI and past medical history; otherwise all systems negative.   Vital Signs:  Patient profile:   65 year old female Height:      65 inches Weight:      234 pounds BMI:     39.08 Pulse rate:   66 / minute BP sitting:   116 / 72  (right arm) Cuff size:   regular  Vitals Entered By: Sara Riley CNA (September 14, 2010 9:31 AM)   Physical Exam  General:  obese. well appearing. no resp difficulty HEENT: normal Neck: supple. no JVD. Carotids 2+ bilat; no bruits. No lymphadenopathy or thryomegaly appreciated. Cor: PMI nondisplaced. Regular rate & rhythm. No rubs, gallops. soft systolic murmur at RSB. s2 crisp Lungs: clear Abdomen: obese. soft, nontender, nondistended. Good bowel sounds. Extremities: no cyanosis, clubbing, rash. tr edema Neuro: alert & orientedx3, cranial nerves grossly intact. moves all 4 extremities w/o difficulty. affect pleasant   Impression & Recommendations:  Problem # 1:  CAD, NATIVE VESSEL (ICD-414.01) Recent Myoview reassuring. Doing well. Encouraged to keep exercising.  Given number of LAD stents we will continue Plavix.  Problem # 2:  HYPERTENSION, BENIGN (ICD-401.1) Blood pressure well controlled. Continue current regimen.  Problem # 3:  HYPERLIPIDEMIA (ICD-272.4) LDL not at goal despite increasing Crestor recently. Stressed need for weight loss and dietary modification. Discussed possible [place] Diet and Weight Watchers  Other Orders: EKG w/ Interpretation (93000)  Patient Instructions: 1)  Your physician wants you to follow-up in:  9 months.  You will receive a reminder letter in the mail two months in advance. If you don't receive a letter, please call our office to schedule the follow-up appointment.

## 2010-10-10 ENCOUNTER — Other Ambulatory Visit: Payer: Self-pay | Admitting: *Deleted

## 2010-10-10 ENCOUNTER — Encounter (INDEPENDENT_AMBULATORY_CARE_PROVIDER_SITE_OTHER): Payer: Self-pay | Admitting: *Deleted

## 2010-10-10 DIAGNOSIS — I10 Essential (primary) hypertension: Secondary | ICD-10-CM

## 2010-10-10 MED ORDER — METOPROLOL TARTRATE 25 MG PO TABS: ORAL_TABLET | ORAL | Status: DC

## 2010-10-11 LAB — TYPE AND SCREEN: ABO/RH(D): O NEG

## 2010-10-11 LAB — URINE MICROSCOPIC-ADD ON

## 2010-10-11 LAB — COMPREHENSIVE METABOLIC PANEL
Albumin: 3 g/dL — ABNORMAL LOW (ref 3.5–5.2)
Alkaline Phosphatase: 44 U/L (ref 39–117)
BUN: 14 mg/dL (ref 6–23)
Calcium: 8.6 mg/dL (ref 8.4–10.5)
Creatinine, Ser: 0.86 mg/dL (ref 0.4–1.2)
Glucose, Bld: 132 mg/dL — ABNORMAL HIGH (ref 70–99)
Total Protein: 6 g/dL (ref 6.0–8.3)

## 2010-10-11 LAB — DIFFERENTIAL
Basophils Absolute: 0 10*3/uL (ref 0.0–0.1)
Basophils Relative: 0 % (ref 0–1)
Lymphocytes Relative: 32 % (ref 12–46)
Monocytes Absolute: 1 10*3/uL (ref 0.1–1.0)
Monocytes Relative: 8 % (ref 3–12)
Neutro Abs: 7.3 10*3/uL (ref 1.7–7.7)
Neutrophils Relative %: 57 % (ref 43–77)

## 2010-10-11 LAB — URINALYSIS, ROUTINE W REFLEX MICROSCOPIC
Glucose, UA: NEGATIVE mg/dL
Hgb urine dipstick: NEGATIVE
pH: 7 (ref 5.0–8.0)

## 2010-10-11 LAB — PROTIME-INR: INR: 1.01 (ref 0.00–1.49)

## 2010-10-11 LAB — CBC
HCT: 43.8 % (ref 36.0–46.0)
Hemoglobin: 15.3 g/dL — ABNORMAL HIGH (ref 12.0–15.0)
MCHC: 35 g/dL (ref 30.0–36.0)
MCV: 94.7 fL (ref 78.0–100.0)
Platelets: 170 10*3/uL (ref 150–400)
RDW: 14.4 % (ref 11.5–15.5)

## 2010-10-11 LAB — APTT: aPTT: 26 seconds (ref 24–37)

## 2010-10-11 LAB — HEMOGLOBIN AND HEMATOCRIT, BLOOD
HCT: 31.2 % — ABNORMAL LOW (ref 36.0–46.0)
Hemoglobin: 12.9 g/dL (ref 12.0–15.0)

## 2010-10-19 NOTE — Miscellaneous (Signed)
Summary: refill transfer to epic  Clinical Lists Changes  Medication refill Metoprolol 25mg  refilled in EPIC. [place], [place]  October 10, 2010 4:30 PM

## 2010-10-31 LAB — BASIC METABOLIC PANEL
BUN: 10 mg/dL (ref 6–23)
BUN: 12 mg/dL (ref 6–23)
BUN: 17 mg/dL (ref 6–23)
CO2: 25 mEq/L (ref 19–32)
Calcium: 8 mg/dL — ABNORMAL LOW (ref 8.4–10.5)
Calcium: 8.2 mg/dL — ABNORMAL LOW (ref 8.4–10.5)
Calcium: 8.3 mg/dL — ABNORMAL LOW (ref 8.4–10.5)
Chloride: 102 mEq/L (ref 96–112)
Creatinine, Ser: 0.52 mg/dL (ref 0.4–1.2)
Creatinine, Ser: 0.74 mg/dL (ref 0.4–1.2)
Creatinine, Ser: 0.97 mg/dL (ref 0.4–1.2)
GFR calc Af Amer: >60 mL/min (ref >60)
GFR calc non Af Amer: >60 mL/min (ref >60)
GFR calc non Af Amer: >60 mL/min (ref >60)
Glucose, Bld: 120 mg/dL — ABNORMAL HIGH (ref 70–99)
Glucose, Bld: 126 mg/dL — ABNORMAL HIGH (ref 70–99)
Potassium: 4.2 mEq/L (ref 3.5–5.1)

## 2010-10-31 LAB — URINALYSIS, ROUTINE W REFLEX MICROSCOPIC
Glucose, UA: NEGATIVE mg/dL
Nitrite: NEGATIVE
Specific Gravity, Urine: 1.025 (ref 1.005–1.030)
pH: 6 (ref 5.0–8.0)

## 2010-10-31 LAB — POCT CARDIAC MARKERS
Myoglobin, poc: 108 ng/mL (ref 12–200)
Troponin i, poc: <0.05 ng/mL (ref 0.00–0.09)

## 2010-10-31 LAB — COMPREHENSIVE METABOLIC PANEL
Albumin: 3.1 g/dL — ABNORMAL LOW (ref 3.5–5.2)
BUN: 16 mg/dL (ref 6–23)
Calcium: 8 mg/dL — ABNORMAL LOW (ref 8.4–10.5)
Creatinine, Ser: 1 mg/dL (ref 0.4–1.2)
GFR calc Af Amer: >60 mL/min (ref >60)
Total Bilirubin: 1.1 mg/dL (ref 0.3–1.2)
Total Protein: 6.2 g/dL (ref 6.0–8.3)

## 2010-10-31 LAB — APTT: aPTT: 29 seconds (ref 24–37)

## 2010-10-31 LAB — DIFFERENTIAL
Basophils Absolute: 0 10*3/uL (ref 0.0–0.1)
Lymphocytes Relative: 14 % (ref 12–46)
Lymphs Abs: 0.8 10*3/uL (ref 0.7–4.0)
Monocytes Absolute: 0.4 10*3/uL (ref 0.1–1.0)
Monocytes Relative: 8 % (ref 3–12)
Neutro Abs: 4.3 10*3/uL (ref 1.7–7.7)

## 2010-10-31 LAB — CBC
HCT: 36.8 % (ref 36.0–46.0)
MCHC: 34.9 g/dL (ref 30.0–36.0)
MCHC: 35.1 g/dL (ref 30.0–36.0)
MCV: 90.1 fL (ref 78.0–100.0)
MCV: 90.8 fL (ref 78.0–100.0)
Platelets: 106 10*3/uL — ABNORMAL LOW (ref 150–400)
Platelets: 91 10*3/uL — ABNORMAL LOW (ref 150–400)
Platelets: 97 10*3/uL — ABNORMAL LOW (ref 150–400)
RBC: 3.84 MIL/uL — ABNORMAL LOW (ref 3.87–5.11)
RDW: 13.9 % (ref 11.5–15.5)
RDW: 14 % (ref 11.5–15.5)
WBC: 3.6 10*3/uL — ABNORMAL LOW (ref 4.0–10.5)

## 2010-10-31 LAB — CARDIAC PANEL(CRET KIN+CKTOT+MB+TROPI)
CK, MB: 1.4 ng/mL (ref 0.3–4.0)
Relative Index: 1.2 (ref 0.0–2.5)
Total CK: 114 U/L (ref 7–177)
Total CK: 119 U/L (ref 7–177)
Troponin I: 0.02 ng/mL (ref 0.00–0.06)

## 2010-10-31 LAB — TSH: TSH: 1.205 u[IU]/mL (ref 0.350–4.500)

## 2010-10-31 LAB — URINE MICROSCOPIC-ADD ON

## 2010-10-31 LAB — TROPONIN I: Troponin I: 0.01 ng/mL (ref 0.00–0.06)

## 2010-10-31 LAB — CK TOTAL AND CKMB (NOT AT ARMC): Total CK: 124 U/L (ref 7–177)

## 2010-12-05 NOTE — Discharge Summary (Signed)
Sara [name], Sara [name] NO.:  [id number]   MEDICAL RECORD NO.:  [id number]          PATIENT TYPE:  INP   LOCATION:  1609                         FACILITY:  [facility]   PHYSICIAN:  Ollen Gross, M.D.    DATE OF BIRTH:  Oct 29, 1945   DATE OF ADMISSION:  03/22/2008  DATE OF DISCHARGE:  03/26/2008                               DISCHARGE SUMMARY   ADMISSION DIAGNOSES:  1. Osteoarthritis of the right knee.  2. Vertigo.  3. History of bronchitis.  4. Hypertension.  5. Hypercholesterolemia.  6. Reflux disease.  7. Diverticulosis.  8. History of childhood jaundice.  9. Hemorrhoids.  10.Arthritis.  11.Cirrhosis.  12.History of candidiasis infections  13.Past history of measles and mumps.  14.Cardiac murmur.   DISCHARGE DIAGNOSES:  1. Osteoarthritis of the right knee status post right total knee      replacement arthroplasty.  2. Mild postoperative hyponatremia, improved.  3. Very mild postoperative hypokalemia.  4. Vertigo.  5. History of bronchitis.  6. Hypertension.  7. Hypercholesterolemia.  8. Reflux disease.  9. Diverticulosis.  10.History of childhood jaundice.  11.Hemorrhoids.  12.Arthritis.  13.Cirrhosis.  14.History of candidiasis infections  15.Past history of measles and mumps.  16.Cardiac murmur.  17.Postoperative atelectasis.   PROCEDURE:  March 22, 2008, right total knee.   SURGEON:  Ollen Gross, M.D.   ASSISTANT:  Jamelle Rushing, P.A.   ANESTHESIA:  Spinal anesthesia.   CONSULTATIONS:  None.   BRIEF HISTORY:  Ms. Shurtz is a 65 year old female with severe end-stage  arthritis of the right knee, progressive worsening pain, dysfunction.  Failed operative management and now presents for total knee  arthroplasty.   LABORATORY DATA:  Preop CBC showed a hemoglobin of 14.6, hematocrit  42.9, white cell count 6.9, platelets 155.  Postop hemoglobin 12.5 and  then 12, last H&H 12.0 and 35.7.  PT/PTT preop 13.5 and 24 respectively.  INR 1.0.   Serial pro-times followed.  PT/INR 24.1 and 2.0.  Chem panel  on admission all within normal limits with the exception of mildly  elevated total bili of 1.5.  Serial BMETs were followed.  Sodium did  drop down from 144-134, back up to 136, potassium started at 3.8, got to  3.5, last noted was 3.4, just very minimal below normal.  Preop UA  moderate leukocyte esterase, few epithelials, 11-20 white cells, few  bacteria.  This was treated preoperatively.   DIAGNOSTICS:  1. EKG on March 23, 2008:  Sinus tachycardia, nonspecific history      of intraventricular conduction delay, left ventricular hypertrophy,      rate faster since last tracing per Dr. Arvilla Meres.  2. Two-view chest March 15, 2008:  Suboptimal level inspiration, no      active disease.  3. Portable chest March 24, 2008:  Volume loss, right base      atelectasis, consolidation.   HOSPITAL COURSE:  The patient was admitted to [facility].  She  tolerated the procedure well.  Later transferred to the recovery room on  the orthopedic floor.  Started on PCA and p.o. analgesic  for pain  control following surgery.  Given 24 hours postop IV antibiotics.  Started on Coumadin for DVT prophylaxis.  Started to get up out of bed  on day 1.  She was a little sedated through the night and had to have  the PCA button withheld and that was discontinued.  She had some itching  which was probably from the Duramorph which would take care of itself.  Discontinued the IV narcotics.  Output was decent, but a little bit on  the lower side.  Started back on her home medications.  Started getting  up out of bed by day 2.  Was still a little sedated.  Doing a little bit  better.  Unfortunately, she had a temperature through the night and had  some cough.  So we checked a chest x-ray to rule out atelectasis.  It  did show some right base atelectasis versus consolidation.  Started her  on some Mucinex congestion medication.  Sodium  was a little low.  Rechecked BMET and discontinued the fluids.  Encouraged her mobility.  Temperature stayed down.  Potassium was a little low though just from  the fluids given, so we gave her a dose of potassium on postop day 3.  Only walking short distances and not quite ready, had not met her goals  yet, so we kept another day.  That afternoon, she did well though and by  the next morning, doing better.  Incision was healing well.  Tolerating  her meds.  Temperature stayed down and was discharged home.   DISPOSITION:  The patient is discharged home on March 26, 2008.   DISCHARGE MEDICATIONS:  1. Vicodin.  2. Robaxin.  3. Coumadin.   DISCHARGE INSTRUCTIONS:  1. Follow up in 2 weeks.  2. Activity:  Weightbearing as tolerated right total knee.  3. Home on PT and home health nursing.   CONDITION ON DISCHARGE:  Improving.      Alexzandrew L. Perkins, P.A.C.      Ollen Gross, M.D.  Electronically Signed    ALP/MEDQ  D:  04/08/2008  T:  04/10/2008  Job:  [id number]   cc:   Samuel Jester  Fax: [phone]

## 2010-12-05 NOTE — H&P (Signed)
NAME:  Sara [name], Sara [name] NO.:  [id number]   MEDICAL RECORD NO.:  [id number]          PATIENT TYPE:  INP   LOCATION:  NA                           FACILITY[facility]  [facility]   PHYSICIAN:  Ollen Gross, M.D.    DATE OF BIRTH:  26-Aug-1945   DATE OF ADMISSION:  DATE OF DISCHARGE:                              HISTORY & PHYSICAL   CHIEF COMPLAINT:  Right knee pain   HISTORY OF PRESENT ILLNESS:  The patient is 65 year old female who has  seen by Dr. Lequita Halt for ongoing right knee pain.  She has been under the  care of Dr. Trellis Paganini recently and has had cortisone injections and  viscous supplementation.  She is at a point now where she would like to  have something permanently done.  Risks and benefits have been  discussed.  She elects to proceed with surgery.   ALLERGIES:  PROCARDIA causes shakes and vomiting.   CURRENT MEDICATIONS:  Avalide, omeprazole, Diflucan, folic acid, One-a-  day vitamins, Crestor, tramadol, Mobic, vitamin E, fish oil, calcium  plus D, glucosamine chondroitin, Triple Flex, niacin, Aleve.   PAST MEDICAL HISTORY:  Vertigo.  History of bronchitis, hypertension,  hypercholesterolemia, reflux disease, diverticulosis.  History of  childhood jaundice.  Hemorrhoids, arthritis, psoriasis, history of  candidiasis infections, and history of measles and mumps.  Cardiac  murmur.   PAST SURGICAL HISTORY:  Tubal ligation, hysterectomy.  Knee scope.   FAMILY HISTORY:  Father with history abdominal aneurysm.  Mother stroke,  hypertension, congestive heart failure.  Brother with seizures.  Daughter with muscular dystrophy.   SOCIAL HISTORY:  Married, hairdresser.  Nonsmoker.  No alcohol.  Two  living children, 1 deceased.  Husband will be assistive care after  surgery.   REVIEW OF SYSTEMS:  GENERAL:  No fevers, chills, occasional night  sweats.  NEURO:  Occasional blurred vision and little bit hearing loss.  No seizures or syncope.  RESPIRATORY:  Occasional shortness  breath on  exertion.  No shortness breath at rest.  History of wheezing in the  past, none now.  CARDIOVASCULAR:  No chest pain, angina, orthopnea.  GI:  No nausea, vomiting, diarrhea, or constipation.  GU: Little bit of  nocturia.  MUSCULOSKELETAL:  Right knee.   PHYSICAL EXAM:  VITAL SIGNS:  Pulse 72, respirations 14, blood pressure  138/70.  GENERAL:  A 65 year old white female well-nourished, well-developed, no  acute distress, alert and cooperative, pleasant, slightly overweight.  HEENT: Normocephalic, atraumatic.  Pupils are round reactive.  Oropharynx clear.  EOMs intact.  NECK:  Supple.  CHEST:  Clear.  HEART:  Regular rate and rhythm with a grade 2/6 to 3/6 systolic  ejection best heard over aortic greater than the pulmonic point.  ABDOMEN:  Soft, nontender.  Bowel sounds present.  RECTAL, BREASTS, GENITALIA:  Not done not pertinent to present illness.  EXTREMITIES:  Right knee tender more medial than lateral.  No effusion.  Range of motion 10-120 marked crepitus.   PLAN:  The patient admitted [facility] undergo a right total  knee replacement arthroplasty.  Surgery will  be performed by Dr. Ollen Gross.      Alexzandrew L. Perkins, P.A.C.      Ollen Gross, M.D.  Electronically Signed    ALP/MEDQ  D:  03/21/2008  T:  03/21/2008  Job:  [id number]   cc:   Samuel Jester  Fax: [phone]

## 2010-12-05 NOTE — Assessment & Plan Note (Signed)
[facility]                         GASTROENTEROLOGY OFFICE NOTE   Sara [name], Sara [name]                        MRN:          [id number]  DATE:11/21/2006                            DOB:          09/30/1945    CHIEF COMPLAINT:  Epigastric pain and dry cough.   PROBLEMS:  1. Gastroesophageal reflux disease with reflux esophagitis and      gastritis found on esophagogastroduodenoscopy December 22, 2002.  2. Diverticulosis and external hemorrhoids on colonoscopy December 22, 2002.  3. Psoriasis.  4. Hypertension.  5. Dyslipidemia.  6. Osteoarthritis.  7. Allergies/allergic rhinitis sinusitis.  8. Prior hysterectomy.  9. Prior tubal ligation.  10.Prior rectocele repair.  11.Prior right knee arthroscopy.  12.Obesity.   MEDICATIONS:  .  1. Crestor 10 mg daily.  2. Avalide 150 mg daily.  3. Tylenol Arthritis daily.  4. Glucosamine 1000 mg daily.  5. Fish oil 1200 mg daily.  6. Calcium 600 mg b.i.d. (with vitamin D).  7. Xanax daily.  8. Nexium 40 mg daily.  9. Vitamin B-12 daily.  10.Methotrexate 10 mg weekly.   DRUG ALLERGIES:  PROCARDIA.   INTERVAL HISTORY:  I have not seen her since 2004.  She has started on  methotrexate for psoriasis since that time.  She has been on Nexium, but  really only taking it p.r.n. because it has been expensive.  Her health  insurance plan is about to run out this month.  She has been having  recurrent epigastric pain.  She has had a lot of dry cough and wonders  if that is related to reflux.  She says the cough started before she  started her methotrexate.  She seems to choke when she tries to swallow  and coughs to the point where she wets her clothes because she has  stress urinary incontinence issues.  She does not have bowel habit  problems like she had when I had seen her previously.  In general they  are better, though she has had some difficulty with loose stools lately.   PAST MEDICAL HISTORY:  As  above.   REVIEW OF SYSTEMS:  As above   PHYSICAL EXAMINATION:  Weight 238 pounds, she was 228 pounds in 2006  when I had seen her.  I have actually seen her in 2006 as well.  Pulse  84, blood pressure 122/82.  EYES:  Anicteric.  PHARYNX:  Clear.  NECK:  Supple, no mass.  CHEST:  Clear.  HEART:  S1, S2; no murmurs, rubs, or gallops.  LOWER EXTREMITIES:  Show trace peripheral edema bilaterally.  The  abdominal wall shows a small subcutaneous nodule in the epigastric area  that she says is unchanged.  It is nontender.  There was no organomegaly  or mass, she is nontender.  SKIN:  Shows psoriasis changes on the hands, fairly severe.  She is alert and oriented x3.   ASSESSMENT:  1. Gastroesophageal reflux disease and dysphagia.  2. Cough, question for methotrexate, pulmonary cause, or reflux versus      other.  3. Irritable bowel syndrome.  We know she has this, she had some      diarrhea recently.  She says when she stopped Special K with      strawberries that got better.  4. Noncompliance issues due to cost.   PLAN:  1. Schedule EGD with possible dilation.  2. Chest x-ray (this has returned and she has no active lung disease).  3. Depending on what happens with this cough may need a pulmonary      referral.  4. Aciphex 20 mg b.i.d., samples for the time being.     Iva Boop, MD,FACG  Electronically Signed    CEG/MedQ  DD: 11/22/2006  DT: 11/22/2006  Job #: [id number]   cc:   Samuel Jester

## 2010-12-05 NOTE — Assessment & Plan Note (Signed)
[facility]                            CARDIOLOGY OFFICE NOTE   BRENDAN, GRUWELL                        MRN:          [id number]  DATE:05/25/2008                            DOB:          [birth year]    PRIMARY CARE PHYSICIAN:  Dr. Samuel Jester.   REASON FOR CONSULTATION:  The patient with recent admission to an  outside hospital with a non-ST-elevation myocardial infarction with  subsequent placement of 3 drug-eluting stents in the LAD.   HISTORY OF PRESENT ILLNESS:  Sara [name] is a pleasant 65 year old  Caucasian female with a past medical history significant for  osteoarthritis, hypertension, hyperlipidemia, gastroesophageal reflux  disease, and a new diagnosis of coronary artery disease, who was  recently admitted to [facility] with complaints of  palpitations, and was found to have a non-ST- elevation myocardial  infarction.  She was then transferred to [facility] in  [place], where she underwent a diagnostic left heart  catheterization.  Per the records, she was noted to have 4 lesions in  the LAD and received a XIENCE drug-eluting stent in the proximal LAD,  another in the mid LAD, and another in the distal LAD.  She was  discharged from the hospital and has done well since that time.  She  tells me that she has never had chest discomfort and has been  functioning at home without any dyspnea, palpitations, dizziness, near  syncope, syncope, orthopnea, PND, or lower extremity edema.  She has no  complaints at this time and tells me that she has been tolerating all of  her medications well since hospital discharge.  She is an active  individual, but tells me that she does not exercise on a regular basis.  She watches her diet.  However, she does feel like she could lose some  weight.   PAST MEDICAL HISTORY:  1. Hypertension.  2. Hyperlipidemia.  3. Gastroesophageal reflux disease.  4.  Coronary artery disease with recent diagnostic left heart      catheterization revealing significant stenoses in the LAD.      Subsequent stent placement with 3 XIENCE drug-eluting stents in the      proximal, mid, and distal LAD.  The proximal LAD had a 3.5- x 8-mm      XIENCE stent placed, the mid LAD had a 3.0- x 8-mm XIENCE stent      placed, and the distal LAD had a 2.75- x 15-mm XIENCE stent placed.  5. Osteoarthritis.  6. Diverticulitis.  7. Hemorrhoids.   PAST SURGICAL HISTORY:  1. Hysterectomy.  2. Bilateral tubal ligation.  3. Total knee replacement.  4. Rectocele repair.   ALLERGIES:  PROCARDIA, which causes shakes and vomiting.   CURRENT MEDICATIONS:  1. Avalide 300/25 mg once daily.  2. Enteric-coated aspirin 81 mg once daily.  3. Plavix 75 mg once daily.  4. Omeprazole 20 mg once daily.  5. Fluconazole 100 mg once daily.  6. Crestor 20 mg one-half tablet once daily.   SOCIAL HISTORY:  The patient denies use of tobacco,  alcohol, or illicit  drugs.  She is married and has 3 children.  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   FAMILY HISTORY:  The patient's mother died from congestive heart failure  and her father died from a ruptured abdominal aortic aneurysm.  She has  2 sisters that have no prior history of coronary artery disease.  She  has 1 brother who died from drowning.   REVIEW OF SYSTEMS:  As stated in history present illness and is  otherwise negative.   PHYSICAL EXAMINATION:  VITAL SIGNS:  Blood pressure 114/82, pulse 87 and  regular, respirations 12 and unlabored.  Weight is 223 pounds.  GENERAL:  She is a pleasant, middle-aged, Caucasian female in no acute  distress.  She is alert and oriented x3.  SKIN:  Warm and dry.  HEENT:  Normal.  MUSCULOSKELETAL:  Muscle strength and tone is normal.  PSYCHIATRIC:  Mood and affect are appropriate.  NEUROLOGICAL:  No focal neurological deficits.  NECK:  No JVD.  No carotid  bruits.  No thyromegaly.  No lymphadenopathy.  LUNGS:  Clear to auscultation bilaterally without wheezes, rhonchi, or  crackles noted.  CARDIOVASCULAR:  Regular rate and rhythm without murmurs, gallops, or  rubs noted.  ABDOMEN:  Soft, nontender, and nondistended.  Bowel sounds are present.  EXTREMITIES:  No evidence of edema.  Pulses are 2+ in the bilateral  dorsalis pedis and posterior tibial arteries.  Radial artery pulses are  2+ bilaterally.   DIAGNOSTIC STUDIES:  1. A 12-lead EKG shows normal sinus rhythm with a ventricular rate of      87 beats per minute.  There is left axis deviation with left      ventricular hypertrophy noted.  There is poor R-wave progression      noted throughout the precordial leads.  2. Echocardiogram performed at [facility] in [place] on May 15, 2008 showed normal left      ventricular size, normal left ventricular systolic function, and      normal wall motion.  There was moderate left ventricular      hypertrophy noted.  There was moderate left atrial enlargement.      The aortic root was dilated with a measurement in diameter of 4.2      cm.  The right ventricle was normal in size.  There was aortic      sclerosis without stenosis.  The mitral valve was sclerotic without      regurgitation.  There was grade 1 diastolic dysfunction.  There was      mild tricuspid regurgitation with mild pulmonary hypertension.      Right ventricular systolic pressure was estimated at 35 mmHg.  3. A full report for the diagnostic left heart catheterization is not      available; however, the patient did receive XIENCE drug-eluting      stents to the proximal, mid, and distal LAD.  The size of the      stents as noted above.   ASSESSMENT AND PLAN:  This is a pleasant 65 year old Caucasian female  with past medical history significant for hypertension, hyperlipidemia,  and a recent diagnosis of coronary artery disease, who  presents today to  establish cardiac care.  I would like to continue her aspirin and Plavix  currently.  She is also taking Crestor, which I will continue.  The  patient will probably also benefit from  being on a beta-blocker  medication.  We will plan on starting this at the next visit.  We will  see her back in this office in 6 months and will repeat an  echocardiogram in 1 year.  We will also attempt to get the medical  records from the two hospitals that she was recently discharged from.  It is not totally clear if the patient had a non-ST-elevation myocardial  infarction; however, this is stated once in the medical record.  The  patient is aware that she should call us if she has any change in her  clinical status over the next 6 months.     Verne Carrow, MD  Electronically Signed    CM/MedQ  DD: 05/25/2008  DT: 05/26/2008  Job #: [id number]   cc:   Samuel Jester

## 2010-12-05 NOTE — Discharge Summary (Signed)
NAMEDONELDA, Sara [name] NO.:  [id number]   MEDICAL RECORD NO.:  [id number]          PATIENT TYPE:  INP   LOCATION:  4731                         FACILITY:  [facility]   PHYSICIAN:  Doylene Canning. Ladona Ridgel, MD    DATE OF BIRTH:  08-02-1945   DATE OF ADMISSION:  12/09/2008  DATE OF DISCHARGE:  12/13/2008                               DISCHARGE SUMMARY   TIME FOR THIS DICTATION:  Greater than 40 minutes.   ALLERGIES:  The patient has allergies to [facility] and CODEINE.   FINAL DIAGNOSES:  1. Admitted with multiple syncopal episodes (3 times, possibly      neurally mediated and exacerbated by      hydrochlorothiazide/hypokalemia.  1A.  Potassium 2.9 on admission.  1. History of myocardial infarction with catheterization in October      2009, in the setting of shortness of breath and palpitation with      stent to the left anterior descending.  2. Left heart catheterization, Dec 09, 2008, for suspected acute      coronary syndrome.  3A.  Ejection fraction at catheterization, 55-60%.  3B.  The left anterior descending had a 10% in-stent restenosis at the  proximal portion.  3C.  There was a 60% ostial diagonal stenosis, not worrisome for  ischemia.  3D.  Proximal circumflex had a 30% stenosis and proximal right coronary  artery had a 20% stenosis.  1. Echocardiogram, Dec 10, 2008, ejection fraction of 60-65%.  Grade 1      diastolic dysfunction.  2. The patient is stopping hydrochlorothiazide, this admission.  3. Potassium 10 mEq also added this admission and metoprolol 12.5 mg      twice daily.  4. Outpatient life watch monitor.  5. Carotid ultrasound, Dec 13, 2008, 40-60% stenoses of bilateral      internal carotid arteries.   SECONDARY DIAGNOSES:  1. Hypertension.  2. Dyslipidemia.  3. Obesity.  4. Diverticulosis.  5. Vertigo.   PROCEDURES THIS ADMISSION:  1. Left heart catheterization, Dec 09, 2008, for a suspected acute      coronary syndrome.  As dictated above,  the stent in the proximal      left anterior descending has a 10% in-stent restenosis, medical      therapy will be continued.  2. A 2-D echocardiogram, Dec 10, 2008, ejection fraction of 60-65%      grade 1 diastolic dysfunction.  3. Carotid ultrasound, Dec 13, 2008, 40-60% bilateral internal carotid      artery stenoses.   BRIEF HISTORY:  Ms. Brophy presents on Dec 09, 2008, for chest pain and  syncope.  She is a 65 year old female.  She had a myocardial infarction  in October 2009.  She was treated with stents to the LAD.   The patient passed out yesterday and again in the evening of Dec 08, 2008.  She came to the office and was found to have passed out one more  time.  She had no loss of continence or tongue biting.  Her initial  blood pressure was 150 systolic.  Her heart rate  was 95.  She has been  treated with Zofran, aspirin, nitroglycerins, IV beta-blocker, and she  feels better.  Apparently, had some nausea, but did not vomit.  The  patient will be admitted to [facility] with suspicion of acute  coronary artery syndrome.  We will plan catheterization sooner, rather  than later.  She has Plavix and aspirin, which she takes for the stent  implantation.   HOSPITAL COURSE:  The patient admits from the office with a syncopal  episode.  She was treated with aspirin, nitroglycerin, beta-blocker, and  oxygen, transferred to [facility].  She underwent left heart  catheterization on Dec 09, 2008, the study showed a 10% in-stent  stenosis in the proximal LAD, otherwise diffuse luminal irregularities  in the remaining LAD, also in the right coronary artery, there is a 30%  proximal lesion in the left circumflex and a 60% ostial stenosis in the  diagonal, which is not flow-limiting.  The patient has been on monitor  throughout this hospitalization.  This is felt perhaps her syncopal  events were neurally mediated.  Her potassium on admission was 2.9, this  has been  replenished.  It is thought that her potassium and  hydrochlorothiazide exacerbated before these syncopal events.  The  patient did have nausea at the time of her syncope.   The patient will go home with a life watch monitor and close follow up  with Dr. Ladona Ridgel.   DISCHARGE MEDICATIONS:  She is asked to stop Avalide of 300/25, that she  took daily.   New medications include:  1. Lisinopril 20 mg daily.  Originally, we have plan to give her      Avapro 300 mg daily; however, she mentioned that she pays upwards      of $300 a month for this medication and the patient thought that      she would try lisinopril on a trial basis.  2. Potassium chloride 10 mEq daily, this is new medication.  3. New medication metoprolol 25 mg one-half tablet in the morning and      one-half tablet in the evening.  4. Crestor 20 mg daily at bedtime.  5. Enteric-coated aspirin 81 mg daily.  6. Plavix 75 mg daily.  7. Calcium with vitamin D daily.  8. Fish oil capsules.  She is to take these as she has been doing      prior to this admission.   FOLLOWUP:  1. She follows up at [facility] to drop by the [facility]      office on the way home to pick up life watch monitor.  2. She will see Dr. Ladona Ridgel, Selena Batten, [place] office at      [facility], Wednesday, December 22, 2008, at 2.30.   LABORATORY STUDIES THIS ADMISSION:  Complete blood count on Dec 11, 2008, hemoglobin 12.3, hematocrit 34.6, white cells 3.6, and platelets  are 91.  Serum electrolytes this admission on the day, on Dec 11, 2008,  sodium 136, potassium 4.1, chloride 105, carbonate 26, BUN is 10,  creatinine 0.52, and glucose 98.  Protime  this admission; 14.7, INR 1.1.  Alkaline phosphatase 62, SGOT 20, and  SGPT is 13.  Troponin I studies are negative 0.01, then 0.02, then 0.01.  Magnesium is 2.1 on presentation.  Urinalysis shows many bacteria and  also squamous cells.  The patient's TSH this admission was 1.205 and BNP  is  less than 30.  Maple Mirza, PA      Doylene Canning. Ladona Ridgel, MD  Electronically Signed    GM/MEDQ  D:  12/13/2008  T:  12/14/2008  Job:  [id number]   cc:   Samuel Jester

## 2010-12-05 NOTE — Op Note (Signed)
NAMECALI, HOPE NO.:  [id number]   MEDICAL RECORD NO.:  [id number]          PATIENT TYPE:  INP   LOCATION:  0003                         FACILITY:  [facility]   PHYSICIAN:  Ollen Gross, M.D.    DATE OF BIRTH:  Dec 01, 1945   DATE OF PROCEDURE:  03/22/2008  DATE OF DISCHARGE:                               OPERATIVE REPORT   PREOPERATIVE DIAGNOSIS:  Osteoarthritis right knee.   POSTOPERATIVE DIAGNOSIS:  Osteoarthritis right knee.   PROCEDURE:  Right total knee arthroplasty.   SURGEON:  Dr. Lequita Halt   ASSISTANT:  Jamelle Rushing, P.A.   ANESTHESIA:  Spinal.   ESTIMATED BLOOD LOSS:  Minimal.   DRAINS:  None.   TOURNIQUET TIME:  34 minutes at 300 mmHg.   COMPLICATIONS:  None.   CONDITION:  Stable to recovery room.   CLINICAL NOTE:  Ms. Pugmire is a 65 year old female with severe end-stage  arthritis of the right knee with progressively worsening pain and  dysfunction.  She has failed nonoperative management and presents for  total knee arthroplasty.   PROCEDURE IN DETAIL:  After the successful administration of spinal  anesthetic a tourniquet is placed on her right thigh and right lower  extremity prepped and draped in the usual sterile fashion.  Extremities  wrapped in Esmarch, knee flexed, tourniquet inflated to 300 mmHg.  Midline incision made with 10 blade through subcutaneous tissue to the  level of the extensor mechanism.  A fresh blade is used make a medial  parapatellar arthrotomy.  Soft tissue of proximal medial tibia is  subperiosteally elevated to the joint line with the knife and into the  semimembranosus bursa with a Cobb elevator.  Soft tissue laterally is  elevated with attention being paid to avoid patellar tendon on tibial  tubercle.  The patella subluxed laterally, knee flexed 90 degrees, ACL,  PCL removed.  Drill was used create a starting hole in the distal femur  and canal was thoroughly irrigated.  The 5 degrees right valgus  alignment guide is placed and referencing off the posterior condyles  rotations marked and the block pinned to remove 11 mm of distal femur.  I did 11 mL because of preop flexion contracture.  Distal femoral  resection is made with an oscillating saw.  Sizing blocks placed and the  AP plane she is a size 4, mediolateral plane size 3 thus a 4 narrow is  to be used.  The size 4 cutting block is placed with rotation marked at  the epicondylar axis.  The anterior-posterior and chamfer cuts were then  made.   The tibia subluxed forward and menisci removed.  Extramedullary tibial  alignment guide is placed referencing proximally at the medial aspect of  the tibial tubercle and distally along the second metatarsal axis and  tibial crest.  Block is pinned to remove 10 mm off the non deficient  lateral side.  Tibial resection is made with an oscillating saw.  Size 3  is the most appropriate tibial component.  The proximal tibia is  prepared the modular drill and keel punch for  size 3.  Femoral  preparation is completed with the intercondylar cut for size 4.   Size 3 mobile bearing tibial trial, size 4 narrow posterior stabilized  femoral trial and the 10 mm posterior stabilized rotating platform  insert trial are placed.  With the 10 there was a tiny bit of  hyperextension, switched to 12.5 which allowed for full extension with  excellent varus-valgus anterior-posterior balance throughout full range  of motion.  Patella was everted and thickness measured to be a 22 mm.  Freehand resection taken to 13 mm, 35 template is placed, lug holes were  drilled, trial patella was placed and it tracks normally.  Osteophytes  removed off the posterior femur with the trial in place.  All trials  were removed and the cut bone surfaces prepared with pulsatile lavage.  Cement was mixed and once ready for implantation a size 3 mobile bearing  tibial tray, size 4 narrow posterior stabilized femur and 35 patella  are  cemented into place.  The patella was held with a clamp.  Trial 12.5-mm  posterior stabilized rotating platform insert is placed.  The knee is  held in full extension and all extruded cement removed.  The cement  fully hardened and the wound was copiously irrigated saline solution and  the permanent 12.5 mm posterior stabilized rotating platform insert is  placed into the tibial tray.  FloSeal was injected on the posterior  aspect of the joint as well as the mediolateral gutters and  suprapatellar area.  A moist sponge is placed and tourniquet released  for a total tourniquet time of 34 minutes.  The sponges held 2 minutes  then removed.  Minimal bleeding was encountered.  That which was  encountered was stopped with electrocautery.  The arthrotomy was closed  with interrupted #1 PDS in flexion against gravity is 135 degrees.  Subcu was closed with interrupted 2-0 Vicryl and subcuticular running 4-  0 Monocryl.  Incisions cleaned and dried and Steri-Strips and bulky  sterile dressing are applied.  She is then placed into a knee  immobilizer, awakened and transferred to recovery in stable condition.      Ollen Gross, M.D.  Electronically Signed     FA/MEDQ  D:  03/22/2008  T:  03/22/2008  Job:  [id number]

## 2010-12-08 ENCOUNTER — Other Ambulatory Visit: Payer: Self-pay | Admitting: Internal Medicine

## 2010-12-08 NOTE — Discharge Summary (Signed)
NAME:  Sara [name], Sara [name]                           ACCOUNT NO.:  [id number]   MEDICAL RECORD NO.:  [id number]                   PATIENT TYPE:  INP   LOCATION:  3740                                 FACILITY:  [facility]   PHYSICIAN:  Carole Binning, M.D. [facility]         DATE OF BIRTH:  November 06, 1945   DATE OF ADMISSION:  02/09/2004  DATE OF DISCHARGE:  02/11/2004                                 DISCHARGE SUMMARY   PROCEDURES:  Two-dimensional echocardiogram.   HOSPITAL COURSE:  [name] is a 65 year old female with no known history  of coronary artery disease.  She had chest pain and was seen by Dr. Dewaine Conger  for this.  She had been taking Nexium with good control but had stopped the  Nexium.  She had symptoms on the day of admission, and she went to Dr.  Karmen Stabs office where an EKG showed a left bundle branch block, duration  unknown.  She was referred to [facility] for further evaluation and  treatment.   [name] remained asymptomatic at [facility].  Her enzymes were  negative for MI.  She has an echocardiogram that has been performed, but the  results are pending at the time of dictation.  If this is normal, she is  tentatively considered stable for discharge on February 11, 2004, and is to have  an outpatient Cardiolite in the office.      Theodore Demark, P.A. [facility]                  Carole Binning, M.D. [facility]    RB/MEDQ  D:  02/11/2004  T:  02/12/2004  Job:  [id number]

## 2010-12-08 NOTE — H&P (Signed)
NAME:  Sara [name] NO.:  [id number]   MEDICAL RECORD NO.:  [id number]                   PATIENT TYPE:  EMS   LOCATION:  MAJO                                 FACILITY:  [facility]   PHYSICIAN:  Carole Binning, M.D. [facility]         DATE OF BIRTH:  1945/08/24   DATE OF ADMISSION:  02/09/2004  DATE OF DISCHARGE:                                HISTORY & PHYSICAL   PRIMARY CARE PHYSICIAN:  Colon Flattery, D.O.   CHIEF COMPLAINT:  Lightheadedness and chest discomfort.   HISTORY OF PRESENT ILLNESS:  [name] is a 65 year old woman without known  prior cardiac history.  She does have cardiovascular risk factors of  hypertension and hyperlipidemia.  She has a long history of atypical chest  pain.  This is described as a sharp pain in the midsternal region which is  typically brought on by eating, and is also noticeable at night.  She  previously had been taking Nexium for this with good control of her  symptoms.  However, she recently stopped the Nexium and did notice an  increase in her symptoms.  She usually achieves relief of her discomfort  with TUMS.  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  She felt very jittery.  She  states she sat down and put her head between her legs, and then she drank as  soft drink, and afterwards felt somewhat better.  However, she continues to  feel somewhat shaky in her extremities.  After this event, she noticed some  mild substernal chest discomfort which resolved without treatment.  She was  seen in Dr. Karmen Stabs office where an EKG showed a left bundle branch block  of unknown chronicity.  She was thus referred to the emergency room for  evaluation.  Currently, she is pain free.   CARDIAC RISK FACTORS:  Outlined above.  She denies a history of diabetes,  tobacco use or a family history of premature coronary artery disease.  She  does complain of a long history of  chest pain as described above.  In  addition, she will note the chest pain on occasion if she is cleaning her  windows or using her arms, but not necessarily with walking. She does  complain of exertional dyspnea.  There is no orthopnea, PND or edema.  She  does report one remote episode of syncope, but none since then.  She has not  had any palpitations today or previously.   PAST MEDICAL HISTORY:  1. Hypertension.  2. Hyperlipidemia.  3. Gastroesophageal reflux disease.   MEDICATIONS:  1. Avapro 150 mg daily.  2. Crestor ?5 mg daily.  3. Multivitamin.  4. Calcium supplements.  5. Fish oil.   ALLERGIES:  The patient has no known drug allergies.  She is intolerant to  fast-acting Nifedipine as it caused significant hypotension.   FAMILY HISTORY:  Mother had a history of diabetes, congestive heart failure,  and cerebrovascular accident.  She died at age 64.  Father died at  approximately age 69 from a ruptured abdominal aortic aneurysm.  There is no  family history of premature coronary artery disease.   REVIEW OF SYSTEMS:  Positive for an episode of lower-abdominal discomfort  with constipation occurring five days ago.  Since then, her bowel movements  have been somewhat sporadic.  Her symptoms have improved; however, she  continues to have some mild lower-abdominal discomfort.  She did notice some  mild fevers and chills five days ago, but none since then.  Otherwise,  review of systems is negative except as documented above.   PHYSICAL EXAMINATION:  GENERAL:  This is an obese but otherwise well-  appearing woman in no acute distress.  VITAL SIGNS:  Temperature 97.8, pulse 76 and regular.  Respirations 22.  Blood pressure 137/76.  Oxygen saturation on four liters is 99%.  SKIN:  Warm and dry without generalized rash.  HEENT:  Normocephalic, atraumatic.  Sclerae are anicteric.  Oral mucosa is  unremarkable.  NECK:  No adenopathy or thyromegaly.  No JVD.  Carotid upstrokes  normal  without bruits.  CHEST:  Clear to percussion and auscultation.  CARDIAC:  PMI is nonpalpable.  There is a regular rate and rhythm, normal S1  and S2 with a 1-2/6 systolic ejection type murmur loudest along the left  sternal border, also heard at the apex.  ABDOMEN:  Soft, nontender without organomegaly.  Normal bowel sounds. No  bruits.  EXTREMITIES:  No clubbing, cyanosis or edema.  Peripheral pulses are 2+  throughout.   EKG:  Reveals sinus rhythm with a left bundle branch block.  Rate is 87.  There are no old EKG's for comparison.   ASSESSMENT:  The patient is 65 years old with cardiac risk factors of  hypertension, hyperlipidemia.  She has a long history of atypical chest  pain.  She presents today after an episode of presyncope with atypical chest  pain.  EKG shows right bundle branch block of unknown chronicity.   PLAN:  The patient will be admitted for observation to rule out myocardial  infarction.  We will obtain serial cardiac markers and EKG's.  If the  patient rules out for myocardial infarction and does not have any  significant arrhythmias or evolution of EKG changes, then we anticipate  potential discharge followed by an outpatient adenosine Cardiolite scan and  echocardiogram.  If the patient does have recurrence of more typical chest  pain or elevation of cardiac enzymes, would recommend cardiac  catheterization.                                                Carole Binning, M.D. [facility]    MWP/MEDQ  D:  02/09/2004  T:  02/09/2004  Job:  [id number]   cc:   Colon Flattery, D.O.  [address]  [place]  [place] [address]  Fax: [phone]

## 2011-01-08 ENCOUNTER — Other Ambulatory Visit: Payer: Self-pay | Admitting: Internal Medicine

## 2011-03-14 ENCOUNTER — Other Ambulatory Visit: Payer: Self-pay | Admitting: Internal Medicine

## 2011-03-27 ENCOUNTER — Other Ambulatory Visit: Payer: Self-pay | Admitting: Obstetrics and Gynecology

## 2011-03-27 DIAGNOSIS — Z1231 Encounter for screening mammogram for malignant neoplasm of breast: Secondary | ICD-10-CM

## 2011-04-25 LAB — CBC
HCT: 34.7 — ABNORMAL LOW
HCT: 35.7 — ABNORMAL LOW
Hemoglobin: 12
Hemoglobin: 12
Hemoglobin: 12.5
MCHC: 33.7
MCHC: 34.6
MCV: 92.6
MCV: 93.6
MCV: 94.1
Platelets: 109 — ABNORMAL LOW
Platelets: 130 — ABNORMAL LOW
RBC: 3.74 — ABNORMAL LOW
RBC: 3.79 — ABNORMAL LOW
RBC: 3.98
RDW: 13.5
RDW: 13.7
WBC: 7.1
WBC: 7.3
WBC: 9.8

## 2011-04-25 LAB — BASIC METABOLIC PANEL
BUN: 14
BUN: 7
CO2: 30
CO2: 31
CO2: 33 — ABNORMAL HIGH
Calcium: 8.4
Chloride: 108
Chloride: 98
Chloride: 99
Chloride: 99
Creatinine, Ser: 0.93
GFR calc Af Amer: >60
GFR calc Af Amer: >60
GFR calc non Af Amer: >60
GFR calc non Af Amer: >60
Glucose, Bld: 132 — ABNORMAL HIGH
Glucose, Bld: 139 — ABNORMAL HIGH
Glucose, Bld: 158 — ABNORMAL HIGH
Potassium: 3.4 — ABNORMAL LOW
Potassium: 3.6
Potassium: 4.5
Sodium: 134 — ABNORMAL LOW
Sodium: 138
Sodium: 142

## 2011-04-25 LAB — PROTIME-INR
INR: 1.3
INR: 1.7 — ABNORMAL HIGH
INR: 1.7 — ABNORMAL HIGH
Prothrombin Time: 16.1 — ABNORMAL HIGH
Prothrombin Time: 20.6 — ABNORMAL HIGH
Prothrombin Time: 21.3 — ABNORMAL HIGH

## 2011-05-28 ENCOUNTER — Encounter: Payer: Self-pay | Admitting: *Deleted

## 2011-05-28 ENCOUNTER — Encounter: Payer: Self-pay | Admitting: Cardiology

## 2011-05-29 ENCOUNTER — Encounter: Payer: Self-pay | Admitting: Internal Medicine

## 2011-05-29 ENCOUNTER — Ambulatory Visit (INDEPENDENT_AMBULATORY_CARE_PROVIDER_SITE_OTHER): Payer: Medicare Other | Admitting: Internal Medicine

## 2011-05-29 VITALS — BP 118/74 | HR 89 | Ht 65.0 in | Wt 247.0 lb

## 2011-05-29 DIAGNOSIS — E785 Hyperlipidemia, unspecified: Secondary | ICD-10-CM

## 2011-05-29 DIAGNOSIS — I2510 Atherosclerotic heart disease of native coronary artery without angina pectoris: Secondary | ICD-10-CM

## 2011-05-29 DIAGNOSIS — R0602 Shortness of breath: Secondary | ICD-10-CM

## 2011-05-29 NOTE — Assessment & Plan Note (Signed)
Doubt DOE is ischemic but if symptoms persist will consider repeating nuclear study or even repeat cath. Will check echo for now. Encouraged her to go to [employer] for weight loss.

## 2011-05-29 NOTE — Assessment & Plan Note (Signed)
As above. Will check echo. Encouraged weight loss.

## 2011-05-29 NOTE — Progress Notes (Signed)
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  [facility] 02/27/10: EF 67% no ischemia. Sleep study: Very mild OSA (AHI 7). Treating with weight loss. Exercising 3x/week with senior aerobics.   Returns for routine f/u. Says she continues continues to be SOB with walking or going up steps. No CP.  Not much change. Feels like it is due to gaining weight again. Up about 20 pounds over past year. No orthopnea, PND or edema. Still taking Plavix. Bruising a lot.  No syncope. Husband says she is snoring more at night.   At last visit increased Crestor to 40.   Lipids TC 152 LDL 84 HDL 45 TG 116  in Oct 2012. - much improved  ROS: All systems negative except as listed in HPI, PMH and Problem List.  Past Medical History  Diagnosis Date  . HTN (hypertension)   . Hyperlipidemia   . GERD (gastroesophageal reflux disease)   . CAD (coronary artery disease)   . Obesity   . OSA (obstructive sleep apnea)   . Candidiasis of unspecified site   . Other psoriasis   . Arthritis   . Hemorrhoid   . Jaundice   . Diverticular disease   . Vertigo   . Bronchitis     Current Outpatient Prescriptions  Medication Sig Dispense Refill  . ALDACTONE 25 MG tablet TAKE 1 TABLET DAILY  30 each  6  . aspirin 81 MG tablet Take 81 mg by mouth daily.        Marland Kitchen CALCIUM CARBONATE-VITAMIN D PO Take by mouth.        . Cholecalciferol (VITAMIN D3 PO) Take 2,000 Units by mouth daily.        Marland Kitchen Clopidogrel Bisulfate (PLAVIX PO) Take 75 mg by mouth daily.       . CRESTOR 40 MG tablet TAKE 1 TABLET DAILY  30 each  6  . fish oil-omega-3 fatty acids 1000 MG capsule Take 2 g by mouth daily.        Marland Kitchen lisinopril (PRINIVIL,ZESTRIL) 20 MG tablet Take 20 mg by mouth daily.        . metoprolol tartrate (LOPRESSOR) 25 MG tablet 1/2 tablet twice daily  60 tablet  11  . nitroGLYCERIN (NITROSTAT) 0.4 MG SL  tablet Place 0.4 mg under the tongue every 5 (five) minutes as needed.        . potassium chloride (K-DUR,KLOR-CON) 10 MEQ tablet TAKE 1 TABLET DAILY  30 tablet  3     PHYSICAL EXAM: Filed Vitals:   05/29/11 0828  BP: 118/74  Pulse: 89   General:  Well appearing. No resp difficulty HEENT: normal Neck: supple. JVP flat. Carotids 2+ bilaterally; no bruits. No lymphadenopathy or thryomegaly appreciated. Cor: PMI normal. Regular rate & rhythm. No rubs, gallops or murmurs. Lungs: clear Abdomen: soft, nontender, obese. Good bowel sounds. Extremities: no cyanosis, clubbing, rash, edema Neuro: alert & orientedx3, cranial nerves grossly intact. Moves all 4 extremities w/o difficulty. Affect pleasant.   ECG: NSR 89 LVH No ST-T wave abnormalities.     ASSESSMENT & PLAN:

## 2011-05-29 NOTE — Assessment & Plan Note (Signed)
Recent lipids are improved. Continue Crestor and watch diet.  Goal LDL < 70.

## 2011-05-29 NOTE — Patient Instructions (Signed)
Your physician has requested that you have an echocardiogram. Echocardiography is a painless test that uses sound waves to create images of your heart. It provides your doctor with information about the size and shape of your heart and how well your heart's chambers and valves are working. This procedure takes approximately one hour. There are no restrictions for this procedure.  The current medical regimen is effective;  continue present plan and medications.  Follow up with Dr Gala Romney in 3 to 4 months in the [facility].

## 2011-06-05 ENCOUNTER — Ambulatory Visit: Payer: Medicare Other

## 2011-06-07 ENCOUNTER — Other Ambulatory Visit (HOSPITAL_COMMUNITY): Payer: Medicare Other | Admitting: Radiology

## 2011-06-12 ENCOUNTER — Ambulatory Visit (HOSPITAL_COMMUNITY): Payer: Medicare Other | Attending: Internal Medicine | Admitting: Radiology

## 2011-06-12 DIAGNOSIS — E785 Hyperlipidemia, unspecified: Secondary | ICD-10-CM | POA: Insufficient documentation

## 2011-06-12 DIAGNOSIS — I2510 Atherosclerotic heart disease of native coronary artery without angina pectoris: Secondary | ICD-10-CM

## 2011-06-12 DIAGNOSIS — R0609 Other forms of dyspnea: Secondary | ICD-10-CM

## 2011-06-12 DIAGNOSIS — I10 Essential (primary) hypertension: Secondary | ICD-10-CM | POA: Insufficient documentation

## 2011-06-12 DIAGNOSIS — I079 Rheumatic tricuspid valve disease, unspecified: Secondary | ICD-10-CM | POA: Insufficient documentation

## 2011-06-12 DIAGNOSIS — R0602 Shortness of breath: Secondary | ICD-10-CM | POA: Insufficient documentation

## 2011-06-19 ENCOUNTER — Ambulatory Visit
Admission: RE | Admit: 2011-06-19 | Discharge: 2011-06-19 | Disposition: A | Discharge status: Home / Self Care | Payer: Medicare Other | Source: Ambulatory Visit | Attending: Obstetrics and Gynecology | Admitting: Obstetrics and Gynecology

## 2011-06-19 DIAGNOSIS — Z1231 Encounter for screening mammogram for malignant neoplasm of breast: Secondary | ICD-10-CM

## 2011-07-03 ENCOUNTER — Other Ambulatory Visit: Payer: Self-pay

## 2011-07-03 ENCOUNTER — Encounter (HOSPITAL_COMMUNITY): Admission: EM | Disposition: A | Discharge status: Home / Self Care | Payer: Self-pay | Attending: Internal Medicine

## 2011-07-03 ENCOUNTER — Encounter (HOSPITAL_COMMUNITY): Payer: Self-pay | Admitting: *Deleted

## 2011-07-03 ENCOUNTER — Inpatient Hospital Stay (HOSPITAL_COMMUNITY)
Admission: EM | Admit: 2011-07-03 | Discharge: 2011-07-04 | DRG: 281 | Disposition: A | Discharge status: Home / Self Care | Payer: Medicare Other | Source: Other Acute Inpatient Hospital | Attending: Internal Medicine | Admitting: Internal Medicine

## 2011-07-03 DIAGNOSIS — I2510 Atherosclerotic heart disease of native coronary artery without angina pectoris: Secondary | ICD-10-CM | POA: Diagnosis present

## 2011-07-03 DIAGNOSIS — E669 Obesity, unspecified: Secondary | ICD-10-CM | POA: Diagnosis present

## 2011-07-03 DIAGNOSIS — R0602 Shortness of breath: Secondary | ICD-10-CM

## 2011-07-03 DIAGNOSIS — I447 Left bundle-branch block, unspecified: Secondary | ICD-10-CM | POA: Diagnosis present

## 2011-07-03 DIAGNOSIS — I10 Essential (primary) hypertension: Secondary | ICD-10-CM

## 2011-07-03 DIAGNOSIS — Z7982 Long term (current) use of aspirin: Secondary | ICD-10-CM

## 2011-07-03 DIAGNOSIS — I214 Non-ST elevation (NSTEMI) myocardial infarction: Secondary | ICD-10-CM | POA: Diagnosis present

## 2011-07-03 DIAGNOSIS — IMO0002 Reserved for concepts with insufficient information to code with codable children: Secondary | ICD-10-CM | POA: Diagnosis not present

## 2011-07-03 DIAGNOSIS — Z9861 Coronary angioplasty status: Secondary | ICD-10-CM

## 2011-07-03 DIAGNOSIS — G4733 Obstructive sleep apnea (adult) (pediatric): Secondary | ICD-10-CM | POA: Diagnosis present

## 2011-07-03 DIAGNOSIS — Y840 Cardiac catheterization as the cause of abnormal reaction of the patient, or of later complication, without mention of misadventure at the time of the procedure: Secondary | ICD-10-CM | POA: Diagnosis not present

## 2011-07-03 DIAGNOSIS — R002 Palpitations: Secondary | ICD-10-CM

## 2011-07-03 DIAGNOSIS — R079 Chest pain, unspecified: Secondary | ICD-10-CM

## 2011-07-03 DIAGNOSIS — I498 Other specified cardiac arrhythmias: Principal | ICD-10-CM | POA: Diagnosis present

## 2011-07-03 DIAGNOSIS — K219 Gastro-esophageal reflux disease without esophagitis: Secondary | ICD-10-CM | POA: Diagnosis present

## 2011-07-03 DIAGNOSIS — Z79899 Other long term (current) drug therapy: Secondary | ICD-10-CM

## 2011-07-03 DIAGNOSIS — Y9210 Unspecified residential institution as the place of occurrence of the external cause: Secondary | ICD-10-CM | POA: Diagnosis not present

## 2011-07-03 DIAGNOSIS — E785 Hyperlipidemia, unspecified: Secondary | ICD-10-CM | POA: Diagnosis present

## 2011-07-03 HISTORY — PX: LEFT HEART CATHETERIZATION WITH CORONARY ANGIOGRAM: SHX5451

## 2011-07-03 LAB — CBC
HCT: 36.7 % (ref 36.0–46.0)
MCH: 31.5 pg (ref 26.0–34.0)
MCHC: 34.9 g/dL (ref 30.0–36.0)
MCHC: 34 g/dL (ref 30.0–36.0)
MCV: 90.4 fL (ref 78.0–100.0)
Platelets: 135 10*3/uL — ABNORMAL LOW (ref 150–400)
RDW: 13.3 % (ref 11.5–15.5)
RDW: 13.5 % (ref 11.5–15.5)

## 2011-07-03 LAB — LIPID PANEL
Cholesterol: 156 mg/dL (ref 0–200)
HDL: 43 mg/dL (ref >39)
Total CHOL/HDL Ratio: 3.6 RATIO
Triglycerides: 65 mg/dL (ref <150)
VLDL: 13 mg/dL (ref 0–40)

## 2011-07-03 LAB — HEMOGLOBIN A1C
Hgb A1c MFr Bld: 6.1 % — ABNORMAL HIGH (ref <5.7)
Mean Plasma Glucose: 128 mg/dL — ABNORMAL HIGH (ref <117)

## 2011-07-03 LAB — CARDIAC PANEL(CRET KIN+CKTOT+MB+TROPI)
Relative Index: 4.2 — ABNORMAL HIGH (ref 0.0–2.5)
Relative Index: 4.6 — ABNORMAL HIGH (ref 0.0–2.5)
Total CK: 109 U/L (ref 7–177)
Troponin I: 0.55 ng/mL (ref <0.30)
Troponin I: 0.48 ng/mL (ref <0.30)

## 2011-07-03 LAB — POCT I-STAT, CHEM 8
Calcium, Ion: 1.14 mmol/L (ref 1.12–1.32)
Creatinine, Ser: 0.7 mg/dL (ref 0.50–1.10)
Glucose, Bld: 125 mg/dL — ABNORMAL HIGH (ref 70–99)
Hemoglobin: 12.9 g/dL (ref 12.0–15.0)
Potassium: 3.5 mEq/L (ref 3.5–5.1)

## 2011-07-03 LAB — DIFFERENTIAL
Basophils Absolute: 0 10*3/uL (ref 0.0–0.1)
Basophils Relative: 0 % (ref 0–1)
Eosinophils Absolute: 0.3 10*3/uL (ref 0.0–0.7)
Eosinophils Relative: 6 % — ABNORMAL HIGH (ref 0–5)
Monocytes Absolute: 0.4 10*3/uL (ref 0.1–1.0)

## 2011-07-03 LAB — PROTIME-INR: Prothrombin Time: 15.3 seconds — ABNORMAL HIGH (ref 11.6–15.2)

## 2011-07-03 LAB — BASIC METABOLIC PANEL
BUN: 17 mg/dL (ref 6–23)
CO2: 19 mEq/L (ref 19–32)
Calcium: 8.6 mg/dL (ref 8.4–10.5)
Calcium: 8.5 mg/dL (ref 8.4–10.5)
Creatinine, Ser: 0.73 mg/dL (ref 0.50–1.10)
Glucose, Bld: 127 mg/dL — ABNORMAL HIGH (ref 70–99)
Potassium: 3.9 mEq/L (ref 3.5–5.1)
Sodium: 140 mEq/L (ref 135–145)
Sodium: 142 mEq/L (ref 135–145)

## 2011-07-03 LAB — TSH: TSH: 1.772 u[IU]/mL (ref 0.350–4.500)

## 2011-07-03 LAB — HEPARIN LEVEL (UNFRACTIONATED): Heparin Unfractionated: 0.72 IU/mL — ABNORMAL HIGH (ref 0.30–0.70)

## 2011-07-03 SURGERY — LEFT HEART CATHETERIZATION WITH CORONARY ANGIOGRAM
Anesthesia: LOCAL

## 2011-07-03 MED ORDER — ONDANSETRON HCL 4 MG/2ML IJ SOLN
4.0000 mg | Freq: Four times a day (QID) | INTRAMUSCULAR | Status: DC | PRN
  Administered 2011-07-03: 4 mg via INTRAVENOUS
  Filled 2011-07-03: qty 2

## 2011-07-03 MED ORDER — ASPIRIN 81 MG PO CHEW: 324.0000 mg | CHEWABLE_TABLET | ORAL | Status: DC

## 2011-07-03 MED ORDER — SODIUM CHLORIDE 0.9 % IJ SOLN
3.0000 mL | Freq: Two times a day (BID) | INTRAMUSCULAR | Status: DC
  Administered 2011-07-03: 3 mL via INTRAVENOUS

## 2011-07-03 MED ORDER — MIDAZOLAM HCL 2 MG/2ML IJ SOLN
INTRAMUSCULAR | Status: AC
  Filled 2011-07-03: qty 2

## 2011-07-03 MED ORDER — ASPIRIN 81 MG PO CHEW
324.0000 mg | CHEWABLE_TABLET | ORAL | Status: AC
  Administered 2011-07-03: 324 mg via ORAL
  Filled 2011-07-03: qty 4

## 2011-07-03 MED ORDER — POTASSIUM CHLORIDE CRYS ER 10 MEQ PO TBCR
10.0000 meq | EXTENDED_RELEASE_TABLET | Freq: Every day | ORAL | Status: DC
  Administered 2011-07-03 – 2011-07-04 (×2): 10 meq via ORAL
  Filled 2011-07-03 (×2): qty 1

## 2011-07-03 MED ORDER — DIAZEPAM 5 MG PO TABS: 5.0000 mg | ORAL_TABLET | ORAL | Status: DC

## 2011-07-03 MED ORDER — SODIUM CHLORIDE 0.9 % IV SOLN: INTRAVENOUS | Status: AC

## 2011-07-03 MED ORDER — HEPARIN BOLUS VIA INFUSION
5000.0000 [IU] | Freq: Once | INTRAVENOUS | Status: AC
  Administered 2011-07-03: 5000 [IU] via INTRAVENOUS
  Filled 2011-07-03: qty 5000

## 2011-07-03 MED ORDER — FENTANYL CITRATE 0.05 MG/ML IJ SOLN
INTRAMUSCULAR | Status: AC
  Filled 2011-07-03: qty 2

## 2011-07-03 MED ORDER — CLOPIDOGREL BISULFATE 75 MG PO TABS
75.0000 mg | ORAL_TABLET | Freq: Once | ORAL | Status: AC
  Administered 2011-07-03: 75 mg via ORAL
  Filled 2011-07-03: qty 1

## 2011-07-03 MED ORDER — DEXTROSE 5 % IV SOLN
30.0000 mg/h | INTRAVENOUS | Status: DC
  Filled 2011-07-03: qty 9

## 2011-07-03 MED ORDER — METOPROLOL TARTRATE 1 MG/ML IV SOLN
INTRAVENOUS | Status: AC
  Administered 2011-07-03: 5 mg
  Filled 2011-07-03: qty 5

## 2011-07-03 MED ORDER — LIDOCAINE HCL (PF) 1 % IJ SOLN
INTRAMUSCULAR | Status: AC
  Filled 2011-07-03: qty 30

## 2011-07-03 MED ORDER — SODIUM CHLORIDE 0.9 % IV SOLN: 250.0000 mL | INTRAVENOUS | Status: DC | PRN

## 2011-07-03 MED ORDER — HEPARIN (PORCINE) IN NACL 2-0.9 UNIT/ML-% IJ SOLN
INTRAMUSCULAR | Status: AC
  Filled 2011-07-03: qty 2000

## 2011-07-03 MED ORDER — SODIUM CHLORIDE 0.9 % IV SOLN
INTRAVENOUS | Status: DC
  Administered 2011-07-03: 10 mL/h via INTRAVENOUS

## 2011-07-03 MED ORDER — LISINOPRIL 20 MG PO TABS
20.0000 mg | ORAL_TABLET | Freq: Every day | ORAL | Status: DC
  Administered 2011-07-03 – 2011-07-04 (×2): 20 mg via ORAL
  Filled 2011-07-03 (×2): qty 1

## 2011-07-03 MED ORDER — ACETAMINOPHEN 325 MG PO TABS: 650.0000 mg | ORAL_TABLET | ORAL | Status: DC | PRN

## 2011-07-03 MED ORDER — DEXTROSE 5 % IV SOLN
60.0000 mg/h | INTRAVENOUS | Status: DC
  Filled 2011-07-03: qty 9

## 2011-07-03 MED ORDER — MORPHINE SULFATE 4 MG/ML IJ SOLN
INTRAMUSCULAR | Status: AC
  Filled 2011-07-03: qty 1

## 2011-07-03 MED ORDER — NITROGLYCERIN 0.2 MG/ML ON CALL CATH LAB
INTRAVENOUS | Status: AC
  Filled 2011-07-03: qty 1

## 2011-07-03 MED ORDER — HEPARIN SOD (PORCINE) IN D5W 100 UNIT/ML IV SOLN
1300.0000 [IU]/h | INTRAVENOUS | Status: DC
  Administered 2011-07-03: 1300 [IU]/h via INTRAVENOUS
  Filled 2011-07-03: qty 250

## 2011-07-03 MED ORDER — ROSUVASTATIN CALCIUM 40 MG PO TABS
40.0000 mg | ORAL_TABLET | Freq: Every day | ORAL | Status: DC
  Filled 2011-07-03 (×2): qty 1

## 2011-07-03 MED ORDER — SODIUM CHLORIDE 0.9 % IJ SOLN: 3.0000 mL | INTRAMUSCULAR | Status: DC | PRN

## 2011-07-03 MED ORDER — SPIRONOLACTONE 25 MG PO TABS
25.0000 mg | ORAL_TABLET | Freq: Every day | ORAL | Status: DC
  Administered 2011-07-03 – 2011-07-04 (×2): 25 mg via ORAL
  Filled 2011-07-03 (×2): qty 1

## 2011-07-03 MED ORDER — SODIUM CHLORIDE 0.9 % IV SOLN
250.0000 mL | INTRAVENOUS | Status: DC | PRN
  Administered 2011-07-03: 250 mL via INTRAVENOUS

## 2011-07-03 MED ORDER — NITROGLYCERIN 0.4 MG SL SUBL
0.4000 mg | SUBLINGUAL_TABLET | SUBLINGUAL | Status: DC | PRN
Start: 2011-07-03 — End: 2011-07-04
  Administered 2011-07-03: 0.4 mg via SUBLINGUAL
  Filled 2011-07-03: qty 25

## 2011-07-03 MED ORDER — MORPHINE SULFATE 4 MG/ML IJ SOLN
2.0000 mg | INTRAMUSCULAR | Status: DC | PRN
  Administered 2011-07-03: 2 mg via INTRAVENOUS

## 2011-07-03 MED ORDER — ASPIRIN EC 81 MG PO TBEC
81.0000 mg | DELAYED_RELEASE_TABLET | Freq: Every day | ORAL | Status: DC
  Administered 2011-07-04: 81 mg via ORAL
  Filled 2011-07-03: qty 1

## 2011-07-03 MED ORDER — HEPARIN SOD (PORCINE) IN D5W 100 UNIT/ML IV SOLN
1200.0000 [IU]/h | INTRAVENOUS | Status: DC
  Filled 2011-07-03: qty 250

## 2011-07-03 MED ORDER — OMEGA-3 FATTY ACIDS 1000 MG PO CAPS
2.0000 g | ORAL_CAPSULE | Freq: Every day | ORAL | Status: DC
  Administered 2011-07-03 – 2011-07-04 (×2): 2 g via ORAL
  Filled 2011-07-03 (×2): qty 2

## 2011-07-03 MED ORDER — SODIUM CHLORIDE 0.9 % IV SOLN: INTRAVENOUS | Status: DC

## 2011-07-03 NOTE — ED Provider Notes (Signed)
History     CSN: [id number] Arrival date & time: 07/03/2011  1:33 AM   None     Chief Complaint  Patient presents with  . Chest Pain  . Tachycardia    (Consider location/radiation/quality/duration/timing/severity/associated sxs/prior treatment) HPI 65 year old female presents to emergency room via EMS with report of significant tachycardia shortness of breath and chest tightness. Initial call was for chest pain and concern for STEMI. Patient denies any chest pain at any time. EMS reports upon their arrival heart rate to 25 wide QRS. Patient was given amiodarone bolus which decreased rate to 150s. Initial blood pressures were hypotensive in the 90s but after amiodarone increased to the 150s. Patient placed on nitro paste and given full dose aspirin. Patient reports history of coronary disease and is followed by cardiology for same. Patient denies past history of A. fib or flutter. No new medications or changes Past Medical History  Diagnosis Date  . HTN (hypertension)   . Hyperlipidemia   . GERD (gastroesophageal reflux disease)   . CAD (coronary artery disease)   . Obesity   . OSA (obstructive sleep apnea)   . Candidiasis of unspecified site   . Other psoriasis   . Arthritis   . Hemorrhoid   . Jaundice   . Diverticular disease   . Vertigo   . Bronchitis     Past Surgical History  Procedure Date  . Tubal ligation   . Knee surgery   . Back surgery     Family History  Problem Relation Age of Onset  . Aneurysm    . Stroke    . Hypertension    . Heart failure    . Seizures    . Muscular dystrophy      History  Substance Use Topics  . Smoking status: Never Smoker   . Smokeless tobacco: Not on file  . Alcohol Use: No    OB History    Grav Para Term Preterm Abortions TAB SAB Ect Mult Living                  Review of Systems  All other systems reviewed and are negative.    Allergies  Codeine and Nifedipine  Home Medications  No current outpatient  prescriptions on file.  BP 129/77  Pulse 81  Temp(Src) 97.7 F (36.5 C) (Oral)  Resp 20  Ht 5\' 5"  (1.651 m)  Wt 240 lb (108.863 kg)  BMI 39.94 kg/m2  SpO2 99%  Physical Exam  Nursing note and vitals reviewed. Constitutional: She is oriented to person, place, and time. She appears well-developed and well-nourished.  HENT:  Head: Normocephalic and atraumatic.  Nose: Nose normal.  Mouth/Throat: Oropharynx is clear and moist.  Eyes: Conjunctivae and EOM are normal. Pupils are equal, round, and reactive to light.  Neck: Normal range of motion. Neck supple. No JVD present. No tracheal deviation present. No thyromegaly present.  Cardiovascular: Normal rate, regular rhythm, normal heart sounds and intact distal pulses.  Exam reveals no gallop and no friction rub.   No murmur heard. Pulmonary/Chest: Effort normal and breath sounds normal. No stridor. No respiratory distress. She has no wheezes. She has no rales. She exhibits no tenderness.  Abdominal: Soft. Bowel sounds are normal. She exhibits no distension and no mass. There is no tenderness. There is no rebound and no guarding.  Musculoskeletal: Normal range of motion. She exhibits no edema and no tenderness.  Lymphadenopathy:    She has no cervical adenopathy.  Neurological: She  is oriented to person, place, and time. She exhibits normal muscle tone. Coordination normal.  Skin: Skin is dry. No rash noted. No erythema. No pallor.  Psychiatric: She has a normal mood and affect. Her behavior is normal. Judgment and thought content normal.    ED Course  Procedures (including critical care time)  Labs Reviewed  CBC - Abnormal; Notable for the following:    Platelets 135 (*)    All other components within normal limits  DIFFERENTIAL - Abnormal; Notable for the following:    Eosinophils Relative 6 (*)    All other components within normal limits  PROTIME-INR - Abnormal; Notable for the following:    Prothrombin Time 15.3 (*)    All  other components within normal limits  BASIC METABOLIC PANEL - Abnormal; Notable for the following:    Potassium 3.4 (*)    Glucose, Bld 127 (*)    GFR calc non Af Amer 87 (*)    All other components within normal limits  POCT I-STAT, CHEM 8 - Abnormal; Notable for the following:    Glucose, Bld 125 (*)    All other components within normal limits  CARDIAC PANEL(CRET KIN+CKTOT+MB+TROPI) - Abnormal; Notable for the following:    CK, MB 4.6 (*)    All other components within normal limits  CBC - Abnormal; Notable for the following:    WBC 3.9 (*)    Platelets 127 (*)    All other components within normal limits  PROTIME-INR - Abnormal; Notable for the following:    Prothrombin Time 15.3 (*)    All other components within normal limits  APTT  POCT I-STAT TROPONIN I  MRSA PCR SCREENING  I-STAT, CHEM 8  I-STAT TROPONIN I  CARDIAC PANEL(CRET KIN+CKTOT+MB+TROPI)  CARDIAC PANEL(CRET KIN+CKTOT+MB+TROPI)  TSH  BRAIN NATRIURETIC PEPTIDE  HEMOGLOBIN A1C  BASIC METABOLIC PANEL  HEPARIN LEVEL (UNFRACTIONATED)  LIPID PANEL   No results found.  Date: 07/03/2011  Rate: 99   Rhythm: normal sinus rhythm  QRS Axis: left  Intervals: normal  ST/T Wave abnormalities: normal  Conduction Disutrbances:left bundle branch block  Narrative Interpretation: new lbbb from lvh and mild ivcd on prior ekg  Old EKG Reviewed: changes noted    1. Essential hypertension, benign   2. Atrial arrhythmia   3. DYSPNEA       MDM  Patient called out as STEMI based on EKG which showed left bundle branch block. Case was discussed with Dr. Riley Kill, on for interventional cardiology. He was at the bedside upon patient's arrival. Based on prior EKGs and present EKGs concern for a flutter with 1:1 THEN 2:1 conduction. Patient is emphatic about not having chest pain, STEMI cancelled.  Patient to be admitted to cardiology for further workup       Olivia Mackie, MD 07/03/11 [phone]

## 2011-07-03 NOTE — H&P (View-Only) (Signed)
Subjective:   65 y/o woman with h/o SVT and previous LAD stents (2010 after presenting with SVT) admitted with NSTEMI in setting of recurrent SVT with rate ~ 220 and new LBBB. Converted in ER with one dose of amio and lopressor.    Had episode of indigestion CP this am. Recently seen in office for worsening dyspnea. Echo last month looked OK. Has been trying to lose weight.  Trop now 0.5.     Intake/Output Summary (Last 24 hours) at 07/03/11 0837 Last data filed at 07/03/11 0800  Gross per 24 hour  Intake     26 ml  Output      0 ml  Net     26 ml    Current meds:    . aspirin EC  81 mg Oral Daily  . clopidogrel  75 mg Oral Once  . fish oil-omega-3 fatty acids  2 g Oral Daily  . heparin      . heparin  5,000 Units Intravenous Once  . lidocaine      . lisinopril  20 mg Oral Daily  . metoprolol      . nitroGLYCERIN      . potassium chloride  10 mEq Oral Daily  . rosuvastatin  40 mg Oral q1800  . sodium chloride  3 mL Intravenous Q12H  . sodium chloride  3 mL Intravenous Q12H  . spironolactone  25 mg Oral Daily   Infusions:    . heparin 13 mL/hr (07/03/11 0700)  . DISCONTD: amiodarone (CORDARONE) infusion    . DISCONTD: amiodarone (CORDARONE) infusion       Objective:  Blood pressure 142/80, pulse 83, temperature 97.6 F (36.4 C), temperature source Oral, resp. rate 23, height 5' 5" (1.651 m), weight 108.863 kg (240 lb), SpO2 98.00%. Weight change:   Physical Exam: General:  Well appearing. No resp difficulty HEENT: normal Neck: supple. JVP . Carotids 2+ bilat; no bruits. No lymphadenopathy or thryomegaly appreciated. Cor: PMI nondisplaced. Regular rate & rhythm. No rubs, gallops or murmurs. Lungs: clear Abdomen: obese soft, nontender, nondistended. No hepatosplenomegaly. No bruits or masses. Good bowel sounds. Extremities: no cyanosis, clubbing, rash, edema Neuro: alert & orientedx3, cranial nerves grossly intact. moves all 4 extremities w/o difficulty. Affect  pleasant  Telemetry: Sinus 80-90  Lab Results: Basic Metabolic Panel:  Lab 07/03/11 0655 07/03/11 0143 07/03/11 0136  NA 142 143 140  K 3.9 3.5 --  CL 113* 109 110  CO2 22 -- 19  GLUCOSE 100* 125* 127*  BUN 14 18 17  CREATININE 0.62 0.70 0.73  CALCIUM 8.5 -- 8.6  MG -- -- --  PHOS -- -- --   Liver Function Tests: No results found for this basename: AST:5,ALT:5,ALKPHOS:5,BILITOT:5,PROT:5,ALBUMIN:5 in the last 168 hours No results found for this basename: LIPASE:5,AMYLASE:5 in the last 168 hours No results found for this basename: AMMONIA:5 in the last 168 hours CBC:  Lab 07/03/11 0655 07/03/11 0143 07/03/11 0136  WBC 3.9* -- 5.3  NEUTROABS -- -- 2.8  HGB 12.7 12.9 12.8  HCT 37.4 38.0 36.7  MCV 91.2 -- 90.4  PLT 127* -- 135*   Cardiac Enzymes:  Lab 07/03/11 0655  CKTOTAL 109  CKMB 4.6*  CKMBINDEX --  TROPONINI 0.55*   BNP: No components found with this basename: POCBNP:5 CBG: No results found for this basename: GLUCAP:5 in the last 168 hours Microbiology: No results found for this basename: cult   No results found for this basename: CULT:2,SDES:2 in the last 168 hours    Imaging: No results found.   ASSESSMENT:  1. Recurrent SVT with rate-dependent LBBB 2. NSTEMI, Type II due to #1 3. CAD s/p LAD stents x3 2010 - after presenting with SVT 4. Obesity 5. OSA 6. HTN 7. HL  PLAN/DISCUSSION:  Given chest pressure this am, +troponin and fact that her initial CAD presentation tion for her CAD was SVT will plan cath today. Will also need EP to see for possible ablation. Continue asa, heparin, b-blocker and statin.  Discussed procedures and she agrees to proceed.    LOS: 0 days    Mardi Cannady, MD 07/03/2011, 8:37 AM  

## 2011-07-03 NOTE — Plan of Care (Signed)
Problem: Phase I Progression Outcomes Goal: Vascular site scale level 0 - I Vascular Site Scale Level 0: No bruising/bleeding/hematoma Level I (Mild): Bruising/Ecchymosis, minimal bleeding/ooozing, palpable hematoma < 3 cm Level II (Moderate): Bleeding not affecting hemodynamic parameters, pseudoaneurysm, palpable hematoma > 3 cm Outcome: Completed/Met Date Met:  07/03/11 Level 1

## 2011-07-03 NOTE — Progress Notes (Signed)
Sara [name] arrived via [facility] EMS for  STEMI.  Provided care and prayer in ED to pt and husband [name]) and nephew [name]).  Pt has previous heart attack and stints.  Did not go to Cath Lab, but rather was moved to heart center - possibility for going to Cath Lab at later date.    Chaplain: Burnis Kingfisher

## 2011-07-03 NOTE — ED Notes (Signed)
Per EMS: pt woke up with CP. Pt then went back to sleep and woke up again with pain. Pt was diaphoretic and pale with EMS. Pt HR was 225 initial pt was given 150mg  of amiodarone bolus over and then started on a 1 mg/min. Pt initial BP was 90's pt was given 2 18g IV in both AC. Pt BP increased 150's. Pt given 324mg  of aspirin and 11/2 inch nitro paste.

## 2011-07-03 NOTE — Plan of Care (Signed)
Problem: Phase I Progression Outcomes Goal: Hemodynamically stable Outcome: Completed/Met Date Met:  07/03/11 All vital sign have been within normal range during ICU stay.

## 2011-07-03 NOTE — ED Notes (Signed)
Cath lab ready, Dr. Riley Kill deliberating. Pending orders/plan.

## 2011-07-03 NOTE — Progress Notes (Signed)
Frequent vitals signs are documented under flowsheet. Monitor was out of paper and unable to print. Will continue to monitor.

## 2011-07-03 NOTE — Progress Notes (Signed)
ANTICOAGULATION CONSULT NOTE - Initial Consult  Pharmacy Consult for heparin Indication: atrial fibrillation  Allergies  Allergen Reactions  . Codeine Other (See Comments)    unknown  . Nifedipine Other (See Comments)    unknown    Patient Measurements: Height: 5\' 5"  (165.1 cm) Weight: 240 lb (108.863 kg) IBW/kg (Calculated) : 57  Adjusted Body Weight: 82.5 kg  Vital Signs: Temp: 97.7 F (36.5 C) (12/11 0145) Temp src: Oral (12/11 0145) BP: 115/66 mmHg (12/11 0400) Pulse Rate: 82  (12/11 0400)  Labs:  Basename 07/03/11 0143 07/03/11 0136  HGB 12.9 12.8  HCT 38.0 36.7  PLT -- 135*  APTT -- 33  LABPROT -- 15.3*  INR -- 1.18  HEPARINUNFRC -- --  CREATININE 0.70 0.73  CKTOTAL -- --  CKMB -- --  TROPONINI -- --   Estimated Creatinine Clearance: 85 ml/min (by C-G formula based on Cr of 0.7).  Medical History: Past Medical History  Diagnosis Date  . HTN (hypertension)   . Hyperlipidemia   . GERD (gastroesophageal reflux disease)   . CAD (coronary artery disease)   . Obesity   . OSA (obstructive sleep apnea)   . Candidiasis of unspecified site   . Other psoriasis   . Arthritis   . Hemorrhoid   . Jaundice   . Diverticular disease   . Vertigo   . Bronchitis     Medications:  Prescriptions prior to admission  Medication Sig Dispense Refill  . ALDACTONE 25 MG tablet TAKE 1 TABLET DAILY  30 each  6  . aspirin 81 MG tablet Take 81 mg by mouth daily.        Marland Kitchen CALCIUM CARBONATE-VITAMIN D PO Take by mouth.        . Cholecalciferol (VITAMIN D3 PO) Take 2,000 Units by mouth daily.        Marland Kitchen Clopidogrel Bisulfate (PLAVIX PO) Take 75 mg by mouth daily.       . CRESTOR 40 MG tablet TAKE 1 TABLET DAILY  30 each  6  . fish oil-omega-3 fatty acids 1000 MG capsule Take 2 g by mouth daily.        Marland Kitchen lisinopril (PRINIVIL,ZESTRIL) 20 MG tablet Take 20 mg by mouth daily.        . metoprolol tartrate (LOPRESSOR) 25 MG tablet 1/2 tablet twice daily  60 tablet  11  .  nitroGLYCERIN (NITROSTAT) 0.4 MG SL tablet Place 0.4 mg under the tongue every 5 (five) minutes as needed. For pain      . potassium chloride (K-DUR,KLOR-CON) 10 MEQ tablet TAKE 1 TABLET DAILY  30 tablet  3   Scheduled:    . aspirin EC  81 mg Oral Daily  . clopidogrel  75 mg Oral Once  . fish oil-omega-3 fatty acids  2 g Oral Daily  . heparin      . heparin  5,000 Units Intravenous Once  . lidocaine      . lisinopril  20 mg Oral Daily  . metoprolol      . nitroGLYCERIN      . potassium chloride  10 mEq Oral Daily  . rosuvastatin  40 mg Oral q1800  . sodium chloride  3 mL Intravenous Q12H  . spironolactone  25 mg Oral Daily    Assessment: 65yo female c/o CP, found with elevated HR and in Afib, to begin heparin.  Goal of Therapy:  Heparin level 0.3-0.7 units/ml   Plan:  Will give heparin 5000 units IV bolus x1  followed by gtt at 1300 units/hr and monitor heparin levels and CBC.  Colleen Can PharmD BCPS 07/03/2011,5:12 AM

## 2011-07-03 NOTE — Progress Notes (Signed)
Subjective:   66 y/o woman with h/o SVT and previous LAD stents (2010 after presenting with SVT) admitted with NSTEMI in setting of recurrent SVT with rate ~ 220 and new LBBB. Converted in ER with one dose of amio and lopressor.    Had episode of indigestion CP this am. Recently seen in office for worsening dyspnea. Echo last month looked OK. Has been trying to lose weight.  Trop now 0.5.     Intake/Output Summary (Last 24 hours) at 07/03/11 0837 Last data filed at 07/03/11 0800  Gross per 24 hour  Intake     26 ml  Output      0 ml  Net     26 ml    Current meds:    . aspirin EC  81 mg Oral Daily  . clopidogrel  75 mg Oral Once  . fish oil-omega-3 fatty acids  2 g Oral Daily  . heparin      . heparin  5,000 Units Intravenous Once  . lidocaine      . lisinopril  20 mg Oral Daily  . metoprolol      . nitroGLYCERIN      . potassium chloride  10 mEq Oral Daily  . rosuvastatin  40 mg Oral q1800  . sodium chloride  3 mL Intravenous Q12H  . sodium chloride  3 mL Intravenous Q12H  . spironolactone  25 mg Oral Daily   Infusions:    . heparin 13 mL/hr (07/03/11 0700)  . DISCONTD: amiodarone (CORDARONE) infusion    . DISCONTD: amiodarone (CORDARONE) infusion       Objective:  Blood pressure 142/80, pulse 83, temperature 97.6 F (36.4 C), temperature source Oral, resp. rate 23, height 5\' 5"  (1.651 m), weight 108.863 kg (240 lb), SpO2 98.00%. Weight change:   Physical Exam: General:  Well appearing. No resp difficulty HEENT: normal Neck: supple. JVP . Carotids 2+ bilat; no bruits. No lymphadenopathy or thryomegaly appreciated. Cor: PMI nondisplaced. Regular rate & rhythm. No rubs, gallops or murmurs. Lungs: clear Abdomen: obese soft, nontender, nondistended. No hepatosplenomegaly. No bruits or masses. Good bowel sounds. Extremities: no cyanosis, clubbing, rash, edema Neuro: alert & orientedx3, cranial nerves grossly intact. moves all 4 extremities w/o difficulty. Affect  pleasant  Telemetry: Sinus 80-90  Lab Results: Basic Metabolic Panel:  Lab 07/03/11 1610 07/03/11 0143 07/03/11 0136  NA 142 143 140  K 3.9 3.5 --  CL 113* 109 110  CO2 22 -- 19  GLUCOSE 100* 125* 127*  BUN 14 18 17   CREATININE 0.62 0.70 0.73  CALCIUM 8.5 -- 8.6  MG -- -- --  PHOS -- -- --   Liver Function Tests: No results found for this basename: AST:5,ALT:5,ALKPHOS:5,BILITOT:5,PROT:5,ALBUMIN:5 in the last 168 hours No results found for this basename: LIPASE:5,AMYLASE:5 in the last 168 hours No results found for this basename: AMMONIA:5 in the last 168 hours CBC:  Lab 07/03/11 0655 07/03/11 0143 07/03/11 0136  WBC 3.9* -- 5.3  NEUTROABS -- -- 2.8  HGB 12.7 12.9 12.8  HCT 37.4 38.0 36.7  MCV 91.2 -- 90.4  PLT 127* -- 135*   Cardiac Enzymes:  Lab 07/03/11 0655  CKTOTAL 109  CKMB 4.6*  CKMBINDEX --  TROPONINI 0.55*   BNP: No components found with this basename: POCBNP:5 CBG: No results found for this basename: GLUCAP:5 in the last 168 hours Microbiology: No results found for this basename: cult   No results found for this basename: CULT:2,SDES:2 in the last 168 hours  Imaging: No results found.   ASSESSMENT:  1. Recurrent SVT with rate-dependent LBBB 2. NSTEMI, Type II due to #1 3. CAD s/p LAD stents x3 2010 - after presenting with SVT 4. Obesity 5. OSA 6. HTN 7. HL  PLAN/DISCUSSION:  Given chest pressure this am, +troponin and fact that her initial CAD presentation tion for her CAD was SVT will plan cath today. Will also need EP to see for possible ablation. Continue asa, heparin, b-blocker and statin.  Discussed procedures and she agrees to proceed.    LOS: 0 days    Arvilla Meres, MD 07/03/2011, 8:37 AM

## 2011-07-03 NOTE — Plan of Care (Signed)
Problem: Phase I Progression Outcomes Goal: Voiding-avoid urinary catheter unless indicated Outcome: Completed/Met Date Met:  07/03/11 Pt voiding

## 2011-07-03 NOTE — Procedures (Signed)
  Cardiac Catheterization Procedure Note  Name: Sara [name] MRN: [id number] DOB: July 14, 1946  Procedure: Left Heart Cath, Selective Coronary Angiography, LV angiography  Indication:  Chest pain, CAD  Procedural details: The right groin was prepped, draped, and anesthetized with 1% lidocaine. Using modified Seldinger technique, a 5 French sheath was introduced into the right femoral artery. Standard Judkins catheters were used for coronary angiography and left ventriculography. Catheter exchanges were performed over a guidewire. There were no immediate procedural complications. The patient was transferred to the post catheterization recovery area for further monitoring.  Procedural Findings:  Hemodynamics:     AO 136/72    LV 133/7   Coronary angiography:  Coronary dominance: Right  Left mainstem:   Normal  Left anterior descending (LAD):   Proximal stent, mid stent widely patent.  Diffuse luminal irregularities.  First diagonal large and normal.    Left circumflex (LCx):  AV groove normal. Ramus intermediate moderate and normal.  OM large and normal.  Right coronary artery (RCA):  Diffuse proximal luminal irregularities.  25% stenosis before the PDA.  PDA moderate and normal.  Posterolateral moderate and normal.  Left ventriculography: Left ventricular systolic function is normal, LVEF is estimated at 55-65%, there is no significant mitral regurgitation   Final Conclusions:  Patent LAD stents.  NL LV function  Recommendations: Medical management and risk reduction.    Rollene Rotunda 07/03/2011, 4:43 PM

## 2011-07-03 NOTE — H&P (Signed)
Subjective:  Sara [name] is a 65 y.o. female who presents for evaluation of chest palpitations that woke her from sleep.  She felt her heart racing, along with associated shortness of breath and nausea.  She denied any chest pain.  She called EMS and was brought to ED for further evaluation.  Initial EKG transmitted was concerning for new LBBB and CODE STEMI was activated.  Patient was seen in the ED and evaluated by Dr. Riley Kill and myself.  After review of EKGs, Code Stemi was deactivated.  Patient was without symptoms upon presentation to ED.  Patient Active Problem List  Diagnoses Date Noted  . Atrial arrhythmia 07/03/2011  . OBSTRUCTIVE SLEEP APNEA 03/15/2010  . DYSPNEA 02/21/2010  . HYPERTENSION, BENIGN 02/28/2009  . CAD, NATIVE VESSEL 02/28/2009  . EDEMA 02/28/2009  . MUMPS 12/09/2008  . CANDIDIASIS 12/09/2008  . HYPERLIPIDEMIA 12/09/2008  . HYPERTENSION 12/09/2008  . CAD 12/09/2008  . HEMORRHOIDS 12/09/2008  . BRONCHITIS 12/09/2008  . GERD 12/09/2008  . DIVERTICULOSIS OF COLON 12/09/2008  . PSORIASIS 12/09/2008  . ARTHRITIS 12/09/2008  . SYNCOPE 12/09/2008  . VERTIGO 12/09/2008  . JAUNDICE 12/09/2008  . CARDIAC MURMUR 12/09/2008  . MEASLES, HX OF 12/09/2008   Past Medical History  Diagnosis Date  . HTN (hypertension)   . Hyperlipidemia   . GERD (gastroesophageal reflux disease)   . CAD (coronary artery disease)   . Obesity   . OSA (obstructive sleep apnea)   . Candidiasis of unspecified site   . Other psoriasis   . Arthritis   . Hemorrhoid   . Jaundice   . Diverticular disease   . Vertigo   . Bronchitis     Past Surgical History  Procedure Date  . Tubal ligation   . Knee surgery   . Back surgery     Prescriptions prior to admission  Medication Sig Dispense Refill  . ALDACTONE 25 MG tablet TAKE 1 TABLET DAILY  30 each  6  . aspirin 81 MG tablet Take 81 mg by mouth daily.        Marland Kitchen CALCIUM CARBONATE-VITAMIN D PO Take by mouth.        . Cholecalciferol  (VITAMIN D3 PO) Take 2,000 Units by mouth daily.        Marland Kitchen Clopidogrel Bisulfate (PLAVIX PO) Take 75 mg by mouth daily.       . CRESTOR 40 MG tablet TAKE 1 TABLET DAILY  30 each  6  . fish oil-omega-3 fatty acids 1000 MG capsule Take 2 g by mouth daily.        Marland Kitchen lisinopril (PRINIVIL,ZESTRIL) 20 MG tablet Take 20 mg by mouth daily.        . metoprolol tartrate (LOPRESSOR) 25 MG tablet 1/2 tablet twice daily  60 tablet  11  . nitroGLYCERIN (NITROSTAT) 0.4 MG SL tablet Place 0.4 mg under the tongue every 5 (five) minutes as needed. For pain      . potassium chloride (K-DUR,KLOR-CON) 10 MEQ tablet TAKE 1 TABLET DAILY  30 tablet  3   Allergies  Allergen Reactions  . Codeine Other (See Comments)    unknown  . Nifedipine Other (See Comments)    unknown    History  Substance Use Topics  . Smoking status: Never Smoker   . Smokeless tobacco: Not on file  . Alcohol Use: No    Family History  Problem Relation Age of Onset  . Aneurysm    . Stroke    . Hypertension    .  Heart failure    . Seizures    . Muscular dystrophy       Review of Systems A comprehensive review of systems was negative other than stated in HPI.  Objective:  Patient Vitals for the past 8 hrs:  BP Temp Temp src Pulse Resp SpO2 Height Weight  07/03/11 0400 115/66 mmHg - - 82  22  96 % - -  07/03/11 0345 97/61 mmHg - - 84  24  97 % - -  07/03/11 0330 98/59 mmHg - - 86  26  96 % - -  07/03/11 0315 118/73 mmHg - - 88  21  96 % - -  07/03/11 0300 102/72 mmHg - - 89  22  97 % - -  07/03/11 0245 89/55 mmHg - - 87  24  96 % - -  07/03/11 0145 156/116 mmHg 97.7 F (36.5 C) Oral 100  20  100 % 5\' 5"  (1.651 m) 108.863 kg (240 lb)   General Appearance:    Alert, cooperative, no distress, appears stated age  Head:    Normocephalic, without obvious abnormality, atraumatic  Eyes:    PERRL, conjunctiva/corneas clear, EOM's intact, fundi    benign, both eyes       Neck:   Supple, no carotid bruit or JVD  Lungs:     Clear to  auscultation bilaterally, respirations unlabored  Heart:    Regular rate and rhythm, S1 and S2 normal, no murmur, rub   or gallop  Abdomen:     Soft, non-tender, bowel sounds active all four quadrants,    no masses, no organomegaly  Extremities:   Extremities normal, atraumatic, no cyanosis or edema  Pulses:   2+ and symmetric all extremities  Skin:   no rashes or lesions  Neurologic:   CNII-XII intact. Normal strength, sensation and reflexes      throughout   Results for orders placed during the hospital encounter of 07/03/11 (from the past 48 hour(s))  CBC     Status: Abnormal   Collection Time   07/03/11  1:36 AM      Component Value Range Comment   WBC 5.3  4.0 - 10.5 (K/uL)    RBC 4.06  3.87 - 5.11 (MIL/uL)    Hemoglobin 12.8  12.0 - 15.0 (g/dL)    HCT 16.1  09.6 - 04.5 (%)    MCV 90.4  78.0 - 100.0 (fL)    MCH 31.5  26.0 - 34.0 (pg)    MCHC 34.9  30.0 - 36.0 (g/dL)    RDW 40.9  81.1 - 91.4 (%)    Platelets 135 (*) 150 - 400 (K/uL)   DIFFERENTIAL     Status: Abnormal   Collection Time   07/03/11  1:36 AM      Component Value Range Comment   Neutrophils Relative 53  43 - 77 (%)    Neutro Abs 2.8  1.7 - 7.7 (K/uL)    Lymphocytes Relative 33  12 - 46 (%)    Lymphs Abs 1.7  0.7 - 4.0 (K/uL)    Monocytes Relative 8  3 - 12 (%)    Monocytes Absolute 0.4  0.1 - 1.0 (K/uL)    Eosinophils Relative 6 (*) 0 - 5 (%)    Eosinophils Absolute 0.3  0.0 - 0.7 (K/uL)    Basophils Relative 0  0 - 1 (%)    Basophils Absolute 0.0  0.0 - 0.1 (K/uL)   PROTIME-INR  Status: Abnormal   Collection Time   07/03/11  1:36 AM      Component Value Range Comment   Prothrombin Time 15.3 (*) 11.6 - 15.2 (seconds)    INR 1.18  0.00 - 1.49    APTT     Status: Normal   Collection Time   07/03/11  1:36 AM      Component Value Range Comment   aPTT 33  24 - 37 (seconds)   BASIC METABOLIC PANEL     Status: Abnormal   Collection Time   07/03/11  1:36 AM      Component Value Range Comment   Sodium  140  135 - 145 (mEq/L)    Potassium 3.4 (*) 3.5 - 5.1 (mEq/L)    Chloride 110  96 - 112 (mEq/L)    CO2 19  19 - 32 (mEq/L)    Glucose, Bld 127 (*) 70 - 99 (mg/dL)    BUN 17  6 - 23 (mg/dL)    Creatinine, Ser 1.61  0.50 - 1.10 (mg/dL)    Calcium 8.6  8.4 - 10.5 (mg/dL)    GFR calc non Af Amer 87 (*) >90 (mL/min)    GFR calc Af Amer >90  >90 (mL/min)   POCT I-STAT TROPONIN I     Status: Normal   Collection Time   07/03/11  1:41 AM      Component Value Range Comment   Troponin i, poc 0.08  0.00 - 0.08 (ng/mL)    Comment 3            POCT I-STAT, CHEM 8     Status: Abnormal   Collection Time   07/03/11  1:43 AM      Component Value Range Comment   Sodium 143  135 - 145 (mEq/L)    Potassium 3.5  3.5 - 5.1 (mEq/L)    Chloride 109  96 - 112 (mEq/L)    BUN 18  6 - 23 (mg/dL)    Creatinine, Ser 0.96  0.50 - 1.10 (mg/dL)    Glucose, Bld 045 (*) 70 - 99 (mg/dL)    Calcium, Ion 4.09  1.12 - 1.32 (mmol/L)    TCO2 17  0 - 100 (mmol/L)    Hemoglobin 12.9  12.0 - 15.0 (g/dL)    HCT 81.1  91.4 - 78.2 (%)     ECG:   Prior ekgs 11/2010 reveal sinus rhythm LAD, LVH with QRS widening Telemetry strips WCT, LBBB HR 190s EKG SVT, possible Atrial flutter/AVNRT, LBBB HR 114 Repeat Sinus rhythm HR 85, LBBB  Assessment:  SVT possible Atrial Flutter/AVNRT New LBBB R/o MI  Plan:  1. Admit to CCU 2. Continuous monitoring on telemetry, ekg prn 3. Medical management Heparin, Amio gtts 4. Serial biomarkers 5. TTE in am 6. Consider ischemic evaluation with nuclear stress vs cardiac catherization.

## 2011-07-03 NOTE — Plan of Care (Signed)
Problem: Phase I Progression Outcomes Goal: MD aware of Cardiac Marker results Outcome: Completed/Met Date Met:  07/03/11 Dr Gala Romney aware of cardiac enzymes.

## 2011-07-03 NOTE — Progress Notes (Signed)
ANTICOAGULATION CONSULT NOTE - Follow Up Consult  Pharmacy Consult for IV heparin Indication: s/p NSTEMI/SVT  Allergies  Allergen Reactions  . Codeine Other (See Comments)    unknown  . Nifedipine Other (See Comments)    unknown    Patient Measurements: Height: 5\' 5"  (165.1 cm) Weight: 240 lb (108.863 kg) IBW/kg (Calculated) : 57  Heparin dosing weight: 82.5 kg  Vital Signs: Temp: 97.9 F (36.6 C) (12/11 1213) Temp src: Oral (12/11 1213) BP: 125/87 mmHg (12/11 1100) Pulse Rate: 84  (12/11 1100)  Labs:  Basename 07/03/11 1244 07/03/11 0655 07/03/11 0143 07/03/11 0136  HGB -- 12.7 12.9 --  HCT -- 37.4 38.0 36.7  PLT -- 127* -- 135*  APTT -- -- -- 33  LABPROT -- 15.3* -- 15.3*  INR -- 1.18 -- 1.18  HEPARINUNFRC 0.72* -- -- --  CREATININE -- 0.62 0.70 0.73  CKTOTAL -- 109 -- --  CKMB -- 4.6* -- --  TROPONINI -- 0.55* -- --   Estimated Creatinine Clearance: 85 ml/min (by C-G formula based on Cr of 0.62).   Medications:  Infusions:    . sodium chloride    . heparin 13 mL/hr (07/03/11 0700)  . DISCONTD: amiodarone (CORDARONE) infusion    . DISCONTD: amiodarone (CORDARONE) infusion      Assessment: 65 year old female on IV heparin s/p NSTEMI/SVT with plan for cath today. Heparin level is slightly supratherapeutic. CBC stable. INR 1.18. No bleeding noted. SCr stable.  Goal of Therapy:  Heparin level 0.3-0.7 units/ml   Plan:  1. Decrease heparin to 1200 units/hr (12 ml/hr). 2. Will follow-up post-cath or recheck level in 6 hours.   Fayne Norrie 07/03/2011,1:58 PM

## 2011-07-03 NOTE — Plan of Care (Signed)
Problem: Phase I Progression Outcomes Goal: Aspirin unless contraindicated Outcome: Completed/Met Date Met:  07/03/11 Asprin 324mg  given this am

## 2011-07-03 NOTE — ED Notes (Signed)
Pt is alert and oriented able to move all extremities and follow commands. Pt states that she is no longer having CP. Pt states that she woke up with CP. Pt rate is controlled at this time in the 80's. Pt BP stable.

## 2011-07-03 NOTE — Interval H&P Note (Signed)
History and Physical Interval Note:  07/03/2011 4:19 PM  Sara [name]  has presented today for surgery, with the diagnosis of NSTEMI  The various methods of treatment have been discussed with the patient and family. After consideration of risks, benefits and other options for treatment, the patient has consented to  Procedure(s): LEFT HEART CATHETERIZATION WITH CORONARY ANGIOGRAM as a surgical intervention .  The patients' history has been reviewed, patient examined, no change in status, stable for surgery.  I have reviewed the patients' chart and labs.  Questions were answered to the patient's satisfaction.     Fayrene Fearing Charleen Madera 4:19 PM 07/03/2011

## 2011-07-04 ENCOUNTER — Other Ambulatory Visit: Payer: Self-pay

## 2011-07-04 DIAGNOSIS — I471 Supraventricular tachycardia: Secondary | ICD-10-CM

## 2011-07-04 LAB — CBC
HCT: 35.2 % — ABNORMAL LOW (ref 36.0–46.0)
MCHC: 34.4 g/dL (ref 30.0–36.0)
MCV: 91.4 fL (ref 78.0–100.0)
RDW: 13.8 % (ref 11.5–15.5)

## 2011-07-04 MED ORDER — METOPROLOL TARTRATE 12.5 MG HALF TABLET
12.5000 mg | ORAL_TABLET | Freq: Two times a day (BID) | ORAL | Status: DC
  Administered 2011-07-04: 12.5 mg via ORAL
  Filled 2011-07-04 (×2): qty 1

## 2011-07-04 NOTE — Discharge Summary (Signed)
Patient ID: Sara [name] MRN: [id number] DOB/AGE: [birth year] 66 y.o.  Admit date: 07/03/2011 Discharge date: 07/04/2011  Primary Discharge Diagnosis 1) Recurrent SVT with rate dependent LBBB 2) NSTEMI, TYpe II due to demand ischemia from#1 3) CAD s/p previous LAD stents 4) R groin hematoma   Hospital Course:   Sara [name] is a 65 y.o woman with h.o SVT and CAD s/p 3 DES to LAD in past. Admitted with recurrent SVT associated with CP. Initial heart rates ~220. Converted with lopressor and amiodarone in ER. Troponin weakly positive. Had recurrent CP/indigestion in hospital and underwent cath which showed normal LV function. LAD stents widely patent. 25% lesion in RCA. Post cath had R groin hematoma which was stabilized. H/H stable. Seen by Dr. Ladona Ridgel in EP and will be schedule for SVT ablation in January.  Stable for discharge today with follow up in the clinic.  Will discontinue plavix as stents placed in 2010 and she has complaints of bruising easily.    Discharge Info: Blood pressure 122/77, pulse 100, temperature 98.1 F (36.7 C), temperature source Oral, resp. rate 20, height 5\' 5"  (1.651 m), weight 114.4 kg (252 lb 3.3 oz), SpO2 95.00%.   Weight change: 5.537 kg (12 lb 3.3 oz) Results for orders placed during the hospital encounter of 07/03/11 (from the past 24 hour(s))  POCT ACTIVATED CLOTTING TIME     Status: Normal   Collection Time   07/03/11  4:42 PM      Component Value Range   Activated Clotting Time 155    CARDIAC PANEL(CRET KIN+CKTOT+MB+TROPI)     Status: Abnormal   Collection Time   07/03/11  6:36 PM      Component Value Range   Total CK 94  7 - 177 (U/L)   CK, MB 4.3 (*) 0.3 - 4.0 (ng/mL)   Troponin I <0.30  <0.30 (ng/mL)   Relative Index RELATIVE INDEX IS INVALID  0.0 - 2.5   CBC     Status: Abnormal   Collection Time   07/04/11  6:00 AM      Component Value Range   WBC 5.2  4.0 - 10.5 (K/uL)   RBC 3.85 (*) 3.87 - 5.11 (MIL/uL)   Hemoglobin 12.1  12.0 - 15.0  (g/dL)   HCT 40.9 (*) 81.1 - 46.0 (%)   MCV 91.4  78.0 - 100.0 (fL)   MCH 31.4  26.0 - 34.0 (pg)   MCHC 34.4  30.0 - 36.0 (g/dL)   RDW 91.4  78.2 - 95.6 (%)   Platelets 123 (*) 150 - 400 (K/uL)     Discharge Medications: Current Discharge Medication List    CONTINUE these medications which have NOT CHANGED   Details  ALDACTONE 25 MG tablet TAKE 1 TABLET DAILY Qty: 30 each, Refills: 6    aspirin 81 MG tablet Take 81 mg by mouth daily.      CALCIUM CARBONATE-VITAMIN D PO Take by mouth.      Cholecalciferol (VITAMIN D3 PO) Take 2,000 Units by mouth daily.      CRESTOR 40 MG tablet TAKE 1 TABLET DAILY Qty: 30 each, Refills: 6    fish oil-omega-3 fatty acids 1000 MG capsule Take 2 g by mouth daily.      lisinopril (PRINIVIL,ZESTRIL) 20 MG tablet Take 20 mg by mouth daily.      metoprolol tartrate (LOPRESSOR) 25 MG tablet 1/2 tablet twice daily Qty: 60 tablet, Refills: 11   Associated Diagnoses: Essential hypertension, benign    nitroGLYCERIN (  NITROSTAT) 0.4 MG SL tablet Place 0.4 mg under the tongue every 5 (five) minutes as needed. For pain    potassium chloride (K-DUR,KLOR-CON) 10 MEQ tablet TAKE 1 TABLET DAILY Qty: 30 tablet, Refills: 3      STOP taking these medications     Clopidogrel Bisulfate (PLAVIX PO)         Follow-up Plans & Instructions: Discharge Orders    Future Appointments: Provider: Department: Dept Phone: Center:   07/26/2011 10:00 AM Beatrice Lecher, PA [facility] [phone] [facility]     Future Orders Please Complete By Expires   Diet - low sodium heart healthy      Increase activity slowly      Driving Restrictions      Comments:   2 days   Lifting restrictions      Comments:   No lifting greater than 5 lbs for 7 days.   Sexual Activity Restrictions      Comments:   None for 1 week   Call MD for:  severe uncontrolled pain      Call MD for:  redness, tenderness, or signs of infection (pain, swelling, redness, odor or  green/yellow discharge around incision site)        Follow-up Information    Follow up with [facility] on 07/26/2011. (10:00 am )    Contact information:   [address] [place] [address] [phone]          BRING ALL MEDICATIONS WITH YOU TO FOLLOW UP APPOINTMENTS  Time spent with patient to include physician time:30 minutes  Signed:  Arvilla Meres, MD 07/04/2011, 1:14 PM

## 2011-07-04 NOTE — Progress Notes (Signed)
Pt concerned about not being able to eat and whether or not she will have procedure.  She is getting anxious and wants to know if she is going or not.  She wants to eat if she can.  PA-C paged to get more information.  Awaiting return call.

## 2011-07-04 NOTE — Consult Note (Signed)
Reason for Consult:SVT  Referring Physician: Bensimohn  Sara [name] is an 65 y.o. female.   HPI: Sara [name] is referred for evaluation of SVT. The patient is a pleasant 65 yo woman with CAD, s/p stenting, HTN, and SVT. She was admitted with SVT at 200/min. And has subsequently ruled in for NSTEMI. She underwent catheterization which demonstrated a patent stent. She has had 4 episodes of SVT in the past. She denies syncope. When she goes into SVT she feels minimally lightheaded. Minimal dyspnea. Mild chest pain.   PMH: Past Medical History  Diagnosis Date  . HTN (hypertension)   . Hyperlipidemia   . GERD (gastroesophageal reflux disease)   . CAD (coronary artery disease)   . Obesity   . OSA (obstructive sleep apnea)   . Candidiasis of unspecified site   . Other psoriasis   . Arthritis   . Hemorrhoid   . Jaundice   . Diverticular disease   . Vertigo   . Bronchitis     PSHX: Past Surgical History  Procedure Date  . Tubal ligation   . Knee surgery   . Back surgery     FAMHX: Family History  Problem Relation Age of Onset  . Aneurysm    . Stroke    . Hypertension    . Heart failure    . Seizures    . Muscular dystrophy      Social History:  reports that she has never smoked. She does not have any smokeless tobacco history on file. She reports that she does not drink alcohol or use illicit drugs.  Allergies:  Allergies  Allergen Reactions  . Codeine Other (See Comments)    unknown  . Nifedipine Other (See Comments)    unknown    Medications: see list  No results found.  ROS  As stated in the HPI and negative for all other systems.  Physical Exam  Vitals:Blood pressure 122/77, pulse 100, temperature 98.1 F (36.7 C), temperature source Oral, resp. rate 20, height 5\' 5"  (1.651 m), weight 114.4 kg (252 lb 3.3 oz), SpO2 95.00%.  Well appearing obese, middle aged woman, NAD HEENT: Unremarkable Neck:  No JVD, no thyromegally Lymphatics:  No  adenopathy Back:  No CVA tenderness Lungs:  Clear with no wheezes, rales, or rhonchi HEART:  Regular rate rhythm, no murmurs, no rubs, no clicks Abd:  Flat, positive bowel sounds, no organomegally, no rebound, no guarding Ext:  2 plus pulses, no edema, no cyanosis, no clubbing Skin:  No rashes no nodules Neuro:  CN II through XII intact, motor grossly intact  ECG- NSR with no pre-excitation. Assessment/Plan: 1. Recurrent SVT - I have discussed the treatment options with the patient and her husband. The risks/benefits/goals/ and expectations of EPS/RFA of SVT has been discussed and she wishes to proceed. We will schedule ablation in the next few weeks.  Sara Gowda TaylorMD 07/04/2011, 8:48 PM

## 2011-07-04 NOTE — Progress Notes (Signed)
Called to see patient to check right groin post cath.  Groin site with bruising over lower abdomen/pubic region which was present earlier today.  Now has a 2cm in diameter area of bruising extending around abdomen with no hematoma and represents tracking of blood from prior bleeding through soft tissue due to gravity.  She is hemodynamically stable with no pain and no active bleeding.  There is no pulsatile mass or hematoma.  Will keep at bedrest tonight and monitor.

## 2011-07-04 NOTE — Progress Notes (Signed)
Subjective:  65 y/o woman with h/o SVT and previous LAD stents (2010 after presenting with SVT) admitted with NSTEMI in setting of recurrent SVT with rate ~ 220 and new LBBB. Converted in ER with one dose of amio and lopressor.  Cath yesterday with patent stents, medical management recommended.   Rt groin site with bruising.  No further CP, SVT. No SOB. C/o chronic back pain  DEP saw will plan SVT ablation in January  Intake/Output Summary (Last 24 hours) at 07/04/11 0855 Last data filed at 07/04/11 0243   Gross per 24 hour   Intake  1502 ml   Output  2575 ml   Net  -1073 ml    Current meds:   .  aspirin  324 mg  Oral  Pre-Cath   .  aspirin EC  81 mg  Oral  Daily   .  fish oil-omega-3 fatty acids  2 g  Oral  Daily   .  heparin      .  lidocaine      .  lisinopril  20 mg  Oral  Daily   .  midazolam      .  potassium chloride  10 mEq  Oral  Daily   .  rosuvastatin  40 mg  Oral  q1800   .  spironolactone  25 mg  Oral  Daily   .  DISCONTD: aspirin  324 mg  Oral  Pre-Cath   .  DISCONTD: diazepam  5 mg  Oral  On Call   .  DISCONTD: sodium chloride  3 mL  Intravenous  Q12H   .  DISCONTD: sodium chloride  3 mL  Intravenous  Q12H    Infusions:   .  sodium chloride    .  DISCONTD: sodium chloride  10 mL/hr at 07/03/11 1600   .  DISCONTD: sodium chloride    .  DISCONTD: heparin  13 mL/hr (07/03/11 0700)   .  DISCONTD: heparin  1,200 Units/hr (07/03/11 1415)    Objective:  Blood pressure 122/77, pulse 100, temperature 98.1 F (36.7 C), temperature source Oral, resp. rate 20, height 5\' 5"  (1.651 m), weight 114.4 kg (252 lb 3.3 oz), SpO2 95.00%.  Weight change: 5.537 kg (12 lb 3.3 oz)  Physical Exam:  General: Well appearing. No resp difficulty  HEENT: normal  Neck: supple. JVP flat . Carotids 2+ bilat; no bruits. No lymphadenopathy or thryomegaly appreciated.  Cor: PMI nondisplaced. Regular rate & rhythm. No rubs, gallops or murmurs.  Lungs: clear  Abdomen: obese soft,  nontender, nondistended. No hepatosplenomegaly. No bruits or masses. Good bowel sounds.  Extremities: no cyanosis, clubbing, rash, edema, Rt groin site with bruising around lower abdomen/pelvic area, no bruit no flank brusing Neuro: alert & orientedx3, cranial nerves grossly intact. moves all 4 extremities w/o difficulty. Affect pleasant  Telemetry: Sinus 80-90  Lab Results:  Basic Metabolic Panel:   Lab  07/03/11 0655  07/03/11 0143  07/03/11 0136   NA  142  143  140   K  3.9  3.5  --   CL  113*  109  110   CO2  22  --  19   GLUCOSE  100*  125*  127*   BUN  14  18  17    CREATININE  0.62  0.70  0.73   CALCIUM  8.5  --  8.6   MG  --  --  --   PHOS  --  --  --  Liver Function Tests:  No results found for this basename: AST:5,ALT:5,ALKPHOS:5,BILITOT:5,PROT:5,ALBUMIN:5 in the last 168 hours  No results found for this basename: LIPASE:5,AMYLASE:5 in the last 168 hours  No results found for this basename: AMMONIA:5 in the last 168 hours  CBC:   Lab  07/04/11 0600  07/03/11 0655  07/03/11 0143  07/03/11 0136   WBC  5.2  3.9*  --  5.3   NEUTROABS  --  --  --  2.8   HGB  12.1  12.7  12.9  12.8   HCT  35.2*  37.4  38.0  36.7   MCV  91.4  91.2  --  90.4   PLT  123*  127*  --  135*    Cardiac Enzymes:   Lab  07/03/11 1836  07/03/11 1244  07/03/11 0655   CKTOTAL  94  109  109   CKMB  4.3*  5.0*  4.6*   CKMBINDEX  --  --  --   TROPONINI  <0.30  0.48*  0.55*    BNP:  No components found with this basename: POCBNP:5  CBG:  No results found for this basename: GLUCAP:5 in the last 168 hours  Microbiology:  No results found for this basename: cult    No results found for this basename: CULT:2,SDES:2 in the last 168 hours  Imaging:  No results found.  ASSESSMENT:  1. Recurrent SVT with rate-dependent LBBB  2. NSTEMI, Type II due to #1  3. CAD s/p LAD stents x3 2010 - after presenting with SVT  4. Obesity  5. OSA  6. HTN  7. HL  PLAN/DISCUSSION:  Discussed findings of cath  with patient and her husband. Will continue risk reduction, continue ASA, b-blocker and statin. Can stop plavix. Home today.  SVT ablation in January.   LOS: 1 day

## 2011-07-04 NOTE — Progress Notes (Signed)
I have been monitoring her groin site and it is darker, purple bruising in her groin area and has spread slightly to the right side and the gauze dressing area has drained outside of marked area.  Patient denies pain.  Vitals are stable. BP 117/64, HR 93 O2 sat 92 on room air.  Dorsalis pedal pulse 2+.  MD notified and will come assess.  Will continue to monitor.

## 2011-07-04 NOTE — Progress Notes (Signed)
Patient ambulated to the bathroom with the assistance of RN after bedrest was complete.  Patient's site has a couple of stained sanguinous areas that are marked.  The groin area is bruised. Will continue to monitor.

## 2011-07-04 NOTE — Progress Notes (Signed)
Utilization review completed. Sallie Staron, RN, BSN. 07/04/11 

## 2011-07-18 ENCOUNTER — Telehealth: Payer: Self-pay | Admitting: Internal Medicine

## 2011-07-18 DIAGNOSIS — I471 Supraventricular tachycardia: Secondary | ICD-10-CM

## 2011-07-18 NOTE — Telephone Encounter (Signed)
New Msg: Pt husband calling regarding the scheduling of pt procedure. Pt is going out of town on 1/19 and pt/pt husband want to do procedure prior to going out of town. Please return pt husband call to discuss further.

## 2011-07-18 NOTE — Telephone Encounter (Signed)
Pt is calling to schedule an ablation with Dr Ladona Ridgel.  She states when she was in the hospital,  Dr Ladona Ridgel came in to see her and told her he would be doing one on her in Jan.

## 2011-07-19 ENCOUNTER — Other Ambulatory Visit: Payer: Self-pay | Admitting: Internal Medicine

## 2011-07-19 ENCOUNTER — Encounter: Payer: Self-pay | Admitting: *Deleted

## 2011-07-19 NOTE — Telephone Encounter (Signed)
Spoke with patients husband  They are going on a cruise on 08/11/11 at will return home 08/18/11  I think it will be best to schedule SVT ablation upon return  So we have scheduled for 08/23/11  Labs on 08/20/11 at 9:00

## 2011-07-26 ENCOUNTER — Ambulatory Visit (INDEPENDENT_AMBULATORY_CARE_PROVIDER_SITE_OTHER): Payer: Medicare Other | Admitting: Physician Assistant

## 2011-07-26 ENCOUNTER — Encounter: Payer: Self-pay | Admitting: Physician Assistant

## 2011-07-26 DIAGNOSIS — I498 Other specified cardiac arrhythmias: Secondary | ICD-10-CM

## 2011-07-26 DIAGNOSIS — E785 Hyperlipidemia, unspecified: Secondary | ICD-10-CM

## 2011-07-26 DIAGNOSIS — I10 Essential (primary) hypertension: Secondary | ICD-10-CM

## 2011-07-26 DIAGNOSIS — I2510 Atherosclerotic heart disease of native coronary artery without angina pectoris: Secondary | ICD-10-CM

## 2011-07-26 DIAGNOSIS — I499 Cardiac arrhythmia, unspecified: Secondary | ICD-10-CM

## 2011-07-26 MED ORDER — METOPROLOL TARTRATE 25 MG PO TABS: ORAL_TABLET | ORAL | Status: DC

## 2011-07-26 NOTE — Assessment & Plan Note (Signed)
She has EPS and RFCA arranged later this month with Dr. Lewayne Bunting.

## 2011-07-26 NOTE — Assessment & Plan Note (Signed)
No chest pain.  Recent cath with stable anatomy and patent stents.  Continue ASA and statin.

## 2011-07-26 NOTE — Assessment & Plan Note (Signed)
Continue Crestor 40 mg QD.

## 2011-07-26 NOTE — Progress Notes (Signed)
[address] [place], [place]  [address] Phone: [phone] Fax:  [phone]  Date:  07/26/2011   Name:  Sara [name]       DOB:  1945/09/27 MRN:  [id number]  PCP:  Dr. Charm Barges Primary Cardiologist:  Dr. Arvilla Meres  Primary Electrophysiologist:  Dr. Lewayne Bunting    History of Present Illness: Sara [name] is a 65 y.o. female who present for post hospital follow up.  She has a h/o CAD s/p LAD stenting (Xience DES x 3), HTN, HL and syncope thought secondary to neuro-cardiogenic syncope.  [facility] 02/27/10: EF 67% no ischemia. Sleep study: Very mild OSA (AHI 7). Treating with weight loss.  Last echo 11/12: Mild LVH, EF 55-65%, grade 1 diastolic dysfunction, moderate LAE.  She was admitted 12/11-12/12 with recurrent SVT.  She converted with Lopressor and amiodarone in the ER.  Of note, she has a rate-dependent LBBB.  Her troponins increased ruling her in for NSTEMI.  LHC 07/03/11: LAD stents patent, RCA 25%, EF 55-65%.  It was thought that she had a type II NSTEMI due to demand ischemia from SVT.  She was seen in consultation by Dr. Ladona Ridgel.  Plans are for elective EPS/RFCA of her SVT later this month.  Dr. Gala Romney saw the patient and discontinued her Plavix due to history of easy bruising and stents placed in 2010.  Labs: Potassium 3.9, creatinine 0.62, TSH 1.772, TC 156, TG 65, HDL 43, LDL 100.  She had some back issues after d/c.  Feels better now.  The patient denies chest pain, shortness of breath, syncope, orthopnea, PND or significant pedal edema.  No palpitations.    Past Medical History  Diagnosis Date  . HTN (hypertension)   . Hyperlipidemia   . GERD (gastroesophageal reflux disease)   . CAD (coronary artery disease)     s/p Xience DES x 3 to LAD;  nuc study 02/27/10: EF 67% no ischemia; echo 11/12: Mild LVH, EF 55-65%, grade 1 diastolic dysfunction, moderate LAE;LHC 07/03/11: LAD stents patent, RCA 25%, EF 55-65%     . Obesity   . OSA (obstructive sleep  apnea)   . Other psoriasis   . Arthritis   . Hemorrhoid   . Jaundice   . Diverticular disease   . Vertigo   . SVT (supraventricular tachycardia)     Current Outpatient Prescriptions  Medication Sig Dispense Refill  . ALDACTONE 25 MG tablet TAKE 1 TABLET DAILY  30 each  6  . aspirin 81 MG tablet Take 81 mg by mouth daily.        Marland Kitchen CALCIUM CARBONATE-VITAMIN D PO Take by mouth.        . Cholecalciferol (VITAMIN D3 PO) Take 2,000 Units by mouth daily.        . CRESTOR 40 MG tablet TAKE 1 TABLET DAILY  30 each  6  . fish oil-omega-3 fatty acids 1000 MG capsule Take 2 g by mouth daily.        Marland Kitchen KLOR-CON 10 10 MEQ CR tablet TAKE 1 TABLET DAILY  30 each  2  . lisinopril (PRINIVIL,ZESTRIL) 20 MG tablet Take 20 mg by mouth daily.        . metoprolol tartrate (LOPRESSOR) 25 MG tablet 1/2 tablet twice daily  60 tablet  11  . nitroGLYCERIN (NITROSTAT) 0.4 MG SL tablet Place 0.4 mg under the tongue every 5 (five) minutes as needed. For pain        Allergies: Allergies  Allergen Reactions  . Codeine Other (See Comments)    unknown  . Nifedipine Other (See Comments)    unknown    History  Substance Use Topics  . Smoking status: Never Smoker   . Smokeless tobacco: Not on file  . Alcohol Use: No     PHYSICAL EXAM: VS:  BP 122/91  Pulse 94  Resp 18  Ht 5\' 5"  (1.651 m)  Wt 233 lb (105.688 kg)  BMI 38.77 kg/m2 Well nourished, well developed, in no acute distress HEENT: normal Neck: no JVD Cardiac:  normal S1, S2; RRR; no murmur Lungs:  clear to auscultation bilaterally, no wheezing, rhonchi or rales Abd: soft, nontender, no hepatomegaly Ext: no edema; RFA site without hematoma or bruit Skin: warm and dry Neuro:  CNs 2-12 intact, no focal abnormalities noted  EKG:   Sinus rhythm, heart rate 99, left axis deviation, poor R-wave progression, interventricular conduction delay, no significant change when compared to prior tracings  ASSESSMENT AND PLAN:

## 2011-07-26 NOTE — Assessment & Plan Note (Signed)
BP and HR borderline.  Increase metoprolol to 25 mg in AM and keep 12.5 mg in PM.

## 2011-07-26 NOTE — Patient Instructions (Signed)
Your physician has recommended you make the following change in your medication: INCREASE Metoprolol to 25mg  one tablet in the morning and one-half tablet in the evening

## 2011-07-31 ENCOUNTER — Other Ambulatory Visit: Payer: Medicare Other | Admitting: *Deleted

## 2011-08-08 ENCOUNTER — Encounter (HOSPITAL_COMMUNITY): Payer: Self-pay | Admitting: Pharmacy Technician

## 2011-08-14 ENCOUNTER — Other Ambulatory Visit: Payer: Medicare Other | Admitting: *Deleted

## 2011-08-20 ENCOUNTER — Telehealth: Payer: Self-pay | Admitting: Internal Medicine

## 2011-08-20 ENCOUNTER — Other Ambulatory Visit (INDEPENDENT_AMBULATORY_CARE_PROVIDER_SITE_OTHER): Payer: Medicare Other | Admitting: *Deleted

## 2011-08-20 DIAGNOSIS — I471 Supraventricular tachycardia: Secondary | ICD-10-CM

## 2011-08-20 DIAGNOSIS — I498 Other specified cardiac arrhythmias: Secondary | ICD-10-CM

## 2011-08-20 LAB — CBC WITH DIFFERENTIAL/PLATELET
Basophils Absolute: 0 10*3/uL (ref 0.0–0.1)
Eosinophils Relative: 8.5 % — ABNORMAL HIGH (ref 0.0–5.0)
MCV: 93.6 fl (ref 78.0–100.0)
Monocytes Absolute: 0.3 10*3/uL (ref 0.1–1.0)
Neutrophils Relative %: 47.3 % (ref 43.0–77.0)
Platelets: 127 10*3/uL — ABNORMAL LOW (ref 150.0–400.0)
RDW: 14.6 % (ref 11.5–14.6)
WBC: 4.8 10*3/uL (ref 4.5–10.5)

## 2011-08-20 LAB — BASIC METABOLIC PANEL
BUN: 15 mg/dL (ref 6–23)
Chloride: 110 mEq/L (ref 96–112)
Creatinine, Ser: 0.6 mg/dL (ref 0.4–1.2)
GFR: 98.61 mL/min (ref >60.00)
Glucose, Bld: 112 mg/dL — ABNORMAL HIGH (ref 70–99)

## 2011-08-20 NOTE — Telephone Encounter (Signed)
Husband calls today to confirm time wife is to be at short stay for ablation on 08/23/11. Pt was in office this am for lab work and was "told by receptionist she didn't have to be at short stay until 7 or 7:15"  I called ep lab.  Pt needs to be there at 5:30 am.  Time of procedure had not changed.  HJusband was upset about time confusion. Reassurance and apologies given. Mylo Red RN

## 2011-08-20 NOTE — Telephone Encounter (Signed)
New msg Pt's husband calling about procedure she is to have on Thursday please call him back

## 2011-08-23 ENCOUNTER — Encounter (HOSPITAL_COMMUNITY)
Admission: RE | Disposition: A | Discharge status: Home / Self Care | Payer: Self-pay | Source: Ambulatory Visit | Attending: Internal Medicine

## 2011-08-23 ENCOUNTER — Ambulatory Visit (HOSPITAL_COMMUNITY)
Admission: RE | Admit: 2011-08-23 | Discharge: 2011-08-23 | Disposition: A | Discharge status: Home / Self Care | Payer: Medicare Other | Source: Ambulatory Visit | Attending: Internal Medicine | Admitting: Internal Medicine

## 2011-08-23 ENCOUNTER — Encounter (HOSPITAL_COMMUNITY): Payer: Self-pay | Admitting: General Practice

## 2011-08-23 DIAGNOSIS — I10 Essential (primary) hypertension: Secondary | ICD-10-CM | POA: Insufficient documentation

## 2011-08-23 DIAGNOSIS — K5730 Diverticulosis of large intestine without perforation or abscess without bleeding: Secondary | ICD-10-CM | POA: Insufficient documentation

## 2011-08-23 DIAGNOSIS — E669 Obesity, unspecified: Secondary | ICD-10-CM | POA: Insufficient documentation

## 2011-08-23 DIAGNOSIS — M129 Arthropathy, unspecified: Secondary | ICD-10-CM | POA: Insufficient documentation

## 2011-08-23 DIAGNOSIS — I252 Old myocardial infarction: Secondary | ICD-10-CM | POA: Insufficient documentation

## 2011-08-23 DIAGNOSIS — I471 Supraventricular tachycardia, unspecified: Secondary | ICD-10-CM | POA: Insufficient documentation

## 2011-08-23 DIAGNOSIS — I2510 Atherosclerotic heart disease of native coronary artery without angina pectoris: Secondary | ICD-10-CM | POA: Insufficient documentation

## 2011-08-23 DIAGNOSIS — G4733 Obstructive sleep apnea (adult) (pediatric): Secondary | ICD-10-CM | POA: Insufficient documentation

## 2011-08-23 DIAGNOSIS — I4719 Other supraventricular tachycardia: Secondary | ICD-10-CM | POA: Insufficient documentation

## 2011-08-23 DIAGNOSIS — E785 Hyperlipidemia, unspecified: Secondary | ICD-10-CM | POA: Insufficient documentation

## 2011-08-23 DIAGNOSIS — K219 Gastro-esophageal reflux disease without esophagitis: Secondary | ICD-10-CM | POA: Insufficient documentation

## 2011-08-23 HISTORY — DX: Other supraventricular tachycardia: I47.19

## 2011-08-23 HISTORY — DX: Supraventricular tachycardia: I47.1

## 2011-08-23 HISTORY — DX: Acute myocardial infarction, unspecified: I21.9

## 2011-08-23 HISTORY — PX: SUPRAVENTRICULAR TACHYCARDIA ABLATION: SHX5492

## 2011-08-23 HISTORY — PX: CARDIAC CATHETERIZATION: SHX172

## 2011-08-23 LAB — PROTIME-INR
INR: 1.07 (ref 0.00–1.49)
Prothrombin Time: 14.1 seconds (ref 11.6–15.2)

## 2011-08-23 SURGERY — SUPRAVENTRICULAR TACHYCARDIA ABLATION
Anesthesia: LOCAL

## 2011-08-23 MED ORDER — SODIUM CHLORIDE 0.9 % IV SOLN: 250.0000 mL | INTRAVENOUS | Status: DC | PRN

## 2011-08-23 MED ORDER — MIDAZOLAM HCL 5 MG/5ML IJ SOLN
INTRAMUSCULAR | Status: AC
  Filled 2011-08-23: qty 5

## 2011-08-23 MED ORDER — ACETAMINOPHEN 325 MG PO TABS: 650.0000 mg | ORAL_TABLET | ORAL | Status: DC | PRN

## 2011-08-23 MED ORDER — SPIRONOLACTONE 25 MG PO TABS
25.0000 mg | ORAL_TABLET | Freq: Every day | ORAL | Status: DC
Start: 2011-08-23 — End: 2011-08-24
  Administered 2011-08-23: 25 mg via ORAL
  Filled 2011-08-23: qty 1

## 2011-08-23 MED ORDER — METOPROLOL TARTRATE 12.5 MG HALF TABLET: 12.5000 mg | ORAL_TABLET | Freq: Two times a day (BID) | ORAL | Status: DC

## 2011-08-23 MED ORDER — FENTANYL CITRATE 0.05 MG/ML IJ SOLN
INTRAMUSCULAR | Status: AC
  Filled 2011-08-23: qty 2

## 2011-08-23 MED ORDER — SODIUM CHLORIDE 0.9 % IJ SOLN
3.0000 mL | Freq: Two times a day (BID) | INTRAMUSCULAR | Status: DC
  Administered 2011-08-23: 3 mL via INTRAVENOUS

## 2011-08-23 MED ORDER — ROSUVASTATIN CALCIUM 40 MG PO TABS
40.0000 mg | ORAL_TABLET | Freq: Every day | ORAL | Status: DC
Start: 2011-08-23 — End: 2011-08-24
  Administered 2011-08-23: 40 mg via ORAL
  Filled 2011-08-23: qty 1

## 2011-08-23 MED ORDER — ONDANSETRON HCL 4 MG/2ML IJ SOLN: 4.0000 mg | Freq: Four times a day (QID) | INTRAMUSCULAR | Status: DC | PRN

## 2011-08-23 MED ORDER — FENTANYL CITRATE 0.05 MG/ML IJ SOLN: 25.0000 ug | INTRAMUSCULAR | Status: DC | PRN

## 2011-08-23 MED ORDER — LISINOPRIL 20 MG PO TABS
20.0000 mg | ORAL_TABLET | Freq: Every day | ORAL | Status: DC
  Administered 2011-08-23: 20 mg via ORAL
  Filled 2011-08-23: qty 1

## 2011-08-23 MED ORDER — METOPROLOL TARTRATE 25 MG PO TABS
25.0000 mg | ORAL_TABLET | Freq: Every day | ORAL | Status: DC
  Administered 2011-08-23: 25 mg via ORAL
  Filled 2011-08-23: qty 1

## 2011-08-23 MED ORDER — SODIUM CHLORIDE 0.9 % IJ SOLN: 3.0000 mL | INTRAMUSCULAR | Status: DC | PRN

## 2011-08-23 MED ORDER — DEXTROSE 5 % IV SOLN
INTRAVENOUS | Status: AC
  Filled 2011-08-23: qty 250

## 2011-08-23 MED ORDER — POTASSIUM CHLORIDE ER 10 MEQ PO TBCR
10.0000 meq | EXTENDED_RELEASE_TABLET | Freq: Every day | ORAL | Status: DC
  Administered 2011-08-23: 10 meq via ORAL
  Filled 2011-08-23: qty 1

## 2011-08-23 MED ORDER — METOPROLOL TARTRATE 12.5 MG HALF TABLET
12.5000 mg | ORAL_TABLET | Freq: Every day | ORAL | Status: DC
  Filled 2011-08-23: qty 1

## 2011-08-23 MED ORDER — HYDROXYUREA 500 MG PO CAPS
ORAL_CAPSULE | ORAL | Status: AC
  Filled 2011-08-23: qty 1

## 2011-08-23 NOTE — H&P (Signed)
Admit H and P  Reason for Consult:SVT  Referring Physician: Bensimohn  Millisa R Sara [name] is an 66 y.o. female.  HPI: Sara [name] is referred for evaluation of SVT. The patient is a pleasant 66 yo woman with CAD, s/p stenting, HTN, and SVT. She was admitted with SVT at 200/min. And has subsequently ruled in for NSTEMI. She underwent catheterization which demonstrated a patent stent. She has had 4 episodes of SVT in the past. She denies syncope. When she goes into SVT she feels minimally lightheaded. Minimal dyspnea. Mild chest pain.  PMH:  Past Medical History   Diagnosis  Date   .  HTN (hypertension)    .  Hyperlipidemia    .  GERD (gastroesophageal reflux disease)    .  CAD (coronary artery disease)    .  Obesity    .  OSA (obstructive sleep apnea)    .  Candidiasis of unspecified site    .  Other psoriasis    .  Arthritis    .  Hemorrhoid    .  Jaundice    .  Diverticular disease    .  Vertigo    .  Bronchitis     PSHX:  Past Surgical History   Procedure  Date   .  Tubal ligation    .  Knee surgery    .  Back surgery     FAMHX:  Family History   Problem  Relation  Age of Onset   .  Aneurysm     .  Stroke     .  Hypertension     .  Heart failure     .  Seizures     .  Muscular dystrophy      Social History: reports that she has never smoked. She does not have any smokeless tobacco history on file. She reports that she does not drink alcohol or use illicit drugs.  Allergies:  Allergies   Allergen  Reactions   .  Codeine  Other (See Comments)     unknown   .  Nifedipine  Other (See Comments)     unknown    Medications: see list  No results found.  ROS  As stated in the HPI and negative for all other systems.  Physical Exam  Vitals:Blood pressure 122/77, pulse 100, temperature 98.1 F (36.7 C), temperature source Oral, resp. rate 20, height 5\' 5"  (1.651 m), weight 114.4 kg (252 lb 3.3 oz), SpO2 95.00%.  Well appearing obese, middle aged woman, NAD  HEENT:  Unremarkable  Neck: No JVD, no thyromegally  Lymphatics: No adenopathy  Back: No CVA tenderness  Lungs: Clear with no wheezes, rales, or rhonchi  HEART: Regular rate rhythm, no murmurs, no rubs, no clicks  Abd: Flat, positive bowel sounds, no organomegally, no rebound, no guarding  Ext: 2 plus pulses, no edema, no cyanosis, no clubbing  Skin: No rashes no nodules  Neuro: CN II through XII intact, motor grossly intact  ECG- NSR with no pre-excitation.  Assessment/Plan:  1. Recurrent SVT - I have discussed the treatment options with the patient and her husband. The risks/benefits/goals/ and expectations of EPS/RFA of SVT has been discussed and she wishes to proceed. We will schedule ablation in the next few weeks.  Sharlot Gowda TaylorMD

## 2011-08-23 NOTE — Op Note (Signed)
Sara [name], Sara [name] NO.:  [id number]  MEDICAL RECORD NO.:  [id number]  LOCATION:  [facility]                         FACILITY:  [facility]  PHYSICIAN:  Doylene Canning. Ladona Ridgel, MD    DATE OF BIRTH:  [birth year]  DATE OF PROCEDURE:  08/23/2011 DATE OF DISCHARGE:                              OPERATIVE REPORT   ELECTROPHYSIOLOGIC PROCEDURE NOTE  PROCEDURE PERFORMED:  Electrophysiologic study and RF catheter ablation of atrioventricular node reentrant tachycardia.  INTRODUCTION:  The patient is a 66 year old woman with recurrent tachy palpitations for many years.  She has had documented SVT at rates of 190 beats per minute.  She has been intolerant of medical therapy, and she is now referred for catheter ablation.  PROCEDURE:  After informed consent was obtained, the patient was taken to the diagnostic EP lab in a fasting state.  After usual preparation and draping, intravenous fentanyl and midazolam were given for sedation. A 6-French hexapolar catheter was inserted percutaneously into the right jugular vein and advanced into coronary sinus.  A 6-French quadripolar catheter was inserted percutaneously into the right femoral vein and advanced to the right atrium at the His bundle region.  A 6-French quadripolar catheter was inserted percutaneously in the right femoral vein and advanced to the right ventricle.  After measurement of the basic intervals, rapid ventricular pacing was carried out from the right ventricle at a pacing cycle length of 600 msec and stepwise decrease down to 340 msec where VA Wenckebach was observed.  During rapid ventricular pacing, the atrial activation sequence was midline and decremental.  There were no inducible arrhythmias.  Next, programmed ventricular stimulation was carried out from the right ventricle at a basic drive cycle length of 161 msec.  The S1-S2 interval stepwise decreased down to 240 msec with retrograde AV node, ERP was  observed. During programmed ventricular stimulation, the atrial activation sequence was again midline and decremental.  There was a VA jump noted. There was no inducible SVT.  Next, programmed atrial stimulation was carried out from the coronary sinus at a basic drive cycle length of 096 msec.  The S1-S2 interval stepwise decreased from 440 msec down to 240 msec, where the atrial refractoriness was observed.  During programmed atrial stimulation, there were AH jumps and the PR interval was greater than the RR interval, but there was no inducible SVT.  Next, rapid atrial pacing was carried out from the coronary sinus at a basic drive cycle length of 045 msec and stepwise decreased down to 270 msec where AV Wenckebach was observed.  It should be noted that during rapid atrial pacing both from the coronary sinus as well as from the high right atrium, the PR interval was initially greater than the RR interval. After multiple pacing runs, pacing at a cycle length of 290 msec resulted in the initiation of SVT.  During SVT, the VA interval was essentially 0 and the atrial activation sequence during SVT was midline. PVCs were replaced at the time of His bundle refractoriness during SVT and this did not pre-excite the atrium and ventricular pacing during SVT resulted in termination of the patient's tachycardia.  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  Mapping was carried out in Fletcher triangle.  Alycia Rossetti triangle was smaller than usual.  A total of 5 RF energy applications were subsequently delivered at [employer].  Following this, rapid atrial pacing was carried out from the coronary sinus in the right atrium and demonstrated that the PR interval was now less than the RR interval and there was no inducible SVT.  The patient was observed for  approximately 20 minutes, and multiple pacing runs were carried out, all of which demonstrated no inducible SVT.  At this point, the catheters were removed, hemostasis was assured, and the patient was returned to her room in satisfactory condition.  COMPLICATIONS:  There were no immediate procedure complications.  RESULTS: 1. Baseline ECG.  Baseline ECG demonstrates sinus rhythm with no     ventricular pre-excitation. 2. Baseline intervals.  The sinus node cycle length was 724 msec, QRS     duration was 140 msec, the HV interval was 57 msec. 3. Rapid ventricular pacing.  Rapid ventricular pacing was carried out     from the right ventricle demonstrating VA Wenckebach at 340 msec.     During rapid ventricular pacing, the atrial activation was midline     and decremental, and there was no inducible SVT. 4. Programmed ventricular stimulation.  Programmed ventricular     stimulation was carried out from the right ventricle at a basic     drive cycle length of 161 msec.  The S1-S2 interval stepwise     decreased down to 240 msec where the retrograde AV node ERP was     observed.  During programmed ventricular stimulation, the atrial     activation sequence was midline and decremental. 5. Rapid atrial pacing.  Rapid atrial pacing was carried out from the     coronary sinus and the right atrium at a basic drive cycle length     of 500 msec and stepwise decreased down to 270 msec where AV     Wenckebach was observed.  Additional pacing was carried out at 290     msec resulting in the initiation of SVT.  Following catheter     ablation, rapid atrial pacing demonstrated an AV Wenckebach cycle     length of 330 msec. 6. Programmed atrial stimulation.  Programmed atrial stimulation was     carried out in the coronary sinus and high right atrium at a basic     drive cycle length of 096 msec.  The S1-S2 interval stepwise     decreased from 540 down to 240 where atrial refractoriness was      observed.  During programmed atrial stimulation prior to ablation,     the PR interval was greater than the RR interval, and following     ablation, the PR interval was less than the RR interval. 7. Arrhythmias observed.     a.     AV node reentrant tachycardia.  Initiation was with rapid      atrial pacing at a cycle length of 290 msec, the duration was      sustained.  The termination was spontaneous, and cycle length of      tachycardia was 330 msec. 8. Mapping.  Mapping of SVT demonstrated that the earliest atrial     activation was in the His bundle region. 9. RF energy  application.  A total of 5 RF energy applications were     delivered to sites 6 through 8 in Bowman triangle and resulted in     accelerated junctional rhythm.  CONCLUSION:  The study demonstrates successful electrophysiologic study and RF catheter ablation of atrioventricular node reentry tachycardia. It should be noted that in order to get tachycardia initiated, rapid atrial pacing with a very low-dose isoproterenol was infused and following catheter ablation, supraventricular tachycardia could no longer be initiated with and without isoproterenol.     Doylene Canning. Ladona Ridgel, MD     GWT/MEDQ  D:  08/23/2011  T:  08/23/2011  Job:  [id number]  cc:   Bevelyn Buckles. Bensimhon, MD

## 2011-08-23 NOTE — Op Note (Signed)
EPS/RFA of AVNRT performed without immediate complication.[id number].

## 2011-08-23 NOTE — Discharge Summary (Signed)
Patient ID: Sara [name],  MRN: [id number], DOB/AGE: 10-26-1945 65 y.o.  Admit date: 08/23/2011 Discharge date: 08/23/2011  Primary EP: Rosette Reveal Primary Cardiologist: D. Bensimhon  Discharge Diagnoses Principal Problem:  *AVNRT (AV nodal re-entry tachycardia) Active Problems:  HYPERLIPIDEMIA  HYPERTENSION, BENIGN  CAD  GERD  OBSTRUCTIVE SLEEP APNEA   Allergies Allergies  Allergen Reactions  . Tape     bleeding  . Codeine Other (See Comments)    unknown  . Nifedipine Other (See Comments)    unknown    Procedures  08/23/2011 - EP Study and successful radiofrequency catheter ablation CONCLUSION:  The study demonstrates successful electrophysiologic study and RF catheter ablation of atrioventricular node reentry tachycardia. It should be noted that in order to get tachycardia initiated, rapid atrial pacing with a very low-dose isoproterenol was infused and following catheter ablation, supraventricular tachycardia could no longer be initiated with and without isoproterenol.  History of Present Illness 66 y/o female with h/o CAD and SVT who was recently admitted in 06/2011 with recurrent SVT.  She was seen by EP during that admission and arrangements were made for EP study with RFCA.  Hospital Course  Pt presented to the [facility] EP lab and underwent EP study with eventual induction of SVT/AVNRT.  She then underwent successful radiofrequency catheter ablation with 5 RF energy applications.  There was no further inducible SVT.  Post procedure, pt has been ambulating without difficulty.  She has had no recurrence of her arrhythmia.  She will be d/c today in good condition.  Discharge Vitals:  Blood pressure 111/71, pulse 92, temperature 98 F (36.7 C), temperature source Oral, resp. rate 18, height 5\' 5"  (1.651 m), weight 224 lb (101.606 kg), SpO2 94.00%.   Labs:  None  Disposition:  Follow-up Information    Follow up with Lewayne Bunting, MD in 4 weeks. (we will arrange)    Contact information:   [address] [place] [address] [phone]       Follow up with Arvilla Meres, MD. (as scheduled)    Contact information:   [address] [place] [address] [phone]          Discharge Medications:  Medication List  As of 08/23/2011  5:44 PM   STOP taking these medications         metoprolol tartrate 25 MG tablet         TAKE these medications         aspirin 81 MG tablet   Take 81 mg by mouth daily.      CALCIUM CARBONATE-VITAMIN D PO   Take 1 tablet by mouth 2 (two) times daily.      fish oil-omega-3 fatty acids 1000 MG capsule   Take 1 g by mouth 2 (two) times daily.      lisinopril 20 MG tablet   Commonly known as: PRINIVIL,ZESTRIL   Take 20 mg by mouth daily.      nitroGLYCERIN 0.4 MG SL tablet   Commonly known as: NITROSTAT   Place 0.4 mg under the tongue every 5 (five) minutes as needed. For chest pain      potassium chloride 10 MEQ tablet   Commonly known as: K-DUR   Take 10 mEq by mouth daily.      rosuvastatin 40 MG tablet   Commonly known as: CRESTOR   Take 40 mg by mouth daily.      spironolactone 25 MG tablet  Commonly known as: ALDACTONE   Take 25 mg by mouth daily.            Outstanding Labs/Studies  None  Duration of Discharge Encounter: Greater than 30 minutes including physician time.  Signed, Nicolasa Ducking NP 08/23/2011, 5:44 PM

## 2011-08-23 NOTE — Progress Notes (Signed)
Patient ambulated in hall with RN 150 feet.  Pressure dressing removed from right groin.  Site a Level 0 with no drainage.  Band aid applied.  Will continue to monitor. Sara [name]

## 2011-08-23 NOTE — Progress Notes (Signed)
DC orders received.  Patient stable with no S/S of distress.  Discharge and medication information reviewed with patient and husband.  Patient DC home with husband. Nolon Nations

## 2011-09-24 ENCOUNTER — Ambulatory Visit (INDEPENDENT_AMBULATORY_CARE_PROVIDER_SITE_OTHER): Payer: Medicare Other | Admitting: Internal Medicine

## 2011-09-24 ENCOUNTER — Encounter: Payer: Self-pay | Admitting: Internal Medicine

## 2011-09-24 DIAGNOSIS — I498 Other specified cardiac arrhythmias: Secondary | ICD-10-CM

## 2011-09-24 DIAGNOSIS — I10 Essential (primary) hypertension: Secondary | ICD-10-CM

## 2011-09-24 DIAGNOSIS — I471 Supraventricular tachycardia: Secondary | ICD-10-CM

## 2011-09-24 NOTE — Progress Notes (Signed)
HPI Sara [name] returns today for followup. She is a 66 year old woman with a history of symptomatic SVT, morbid obesity, and hypertension. She underwent catheter ablation of AV node reentrant tachycardia several weeks ago. Since then she has done well. She denies recurrent SVT. No chest pain or shortness of breath. She has started walking on the treadmill. She is trying to lose weight. Allergies  Allergen Reactions  . Tape     bleeding  . Codeine Other (See Comments)    unknown  . Nifedipine Other (See Comments)    unknown     Current Outpatient Prescriptions  Medication Sig Dispense Refill  . aspirin 81 MG tablet Take 81 mg by mouth daily.       Marland Kitchen CALCIUM CARBONATE-VITAMIN D PO Take 1 tablet by mouth 2 (two) times daily.       . fish oil-omega-3 fatty acids 1000 MG capsule Take 1 g by mouth 2 (two) times daily.       Marland Kitchen lisinopril (PRINIVIL,ZESTRIL) 20 MG tablet Take 20 mg by mouth daily.       . nitroGLYCERIN (NITROSTAT) 0.4 MG SL tablet Place 0.4 mg under the tongue every 5 (five) minutes as needed. For chest pain      . potassium chloride (K-DUR) 10 MEQ tablet Take 10 mEq by mouth daily.      . rosuvastatin (CRESTOR) 40 MG tablet Take 40 mg by mouth daily.      Marland Kitchen spironolactone (ALDACTONE) 25 MG tablet Take 25 mg by mouth daily.         Past Medical History  Diagnosis Date  . HTN (hypertension)   . Hyperlipidemia   . GERD (gastroesophageal reflux disease)   . CAD (coronary artery disease)     s/p Xience DES x 3 to LAD;  nuc study 02/27/10: EF 67% no ischemia; echo 11/12: Mild LVH, EF 55-65%, grade 1 diastolic dysfunction, moderate LAE;LHC 07/03/11: LAD stents patent, RCA 25%, EF 55-65%     . Obesity   . OSA (obstructive sleep apnea)   . Other psoriasis   . Arthritis   . Hemorrhoid   . Jaundice   . Diverticular disease   . Vertigo   . Shortness of breath   . Myocardial infarction   . AVNRT (AV nodal re-entry tachycardia)   . Bronchitis     ROS:   All systems reviewed  and negative except as noted in the HPI.   Past Surgical History  Procedure Date  . Tubal ligation   . Knee surgery   . Back surgery   . Cardiac catheterization 08/23/2011    Ablation AV  Node  . Abdominal hysterectomy      Family History  Problem Relation Age of Onset  . Aneurysm    . Stroke    . Hypertension    . Heart failure    . Seizures    . Muscular dystrophy       History   Social History  . Marital Status: Married    Spouse Name: N/A    Number of Children: 2  . Years of Education: N/A   Occupational History  . hairdresser    Social History Main Topics  . Smoking status: Never Smoker   . Smokeless tobacco: Never Used  . Alcohol Use: No  . Drug Use: No  . Sexually Active: Not Currently    Birth Control/ Protection: Post-menopausal   Other Topics Concern  . Not on file   Social History Narrative  .  No narrative on file     BP 120/78  Pulse 86  Ht 5\' 5"  (1.651 m)  Wt 109.498 kg (241 lb 6.4 oz)  BMI 40.17 kg/m2  Physical Exam:  Well appearing 66 year old woman, NAD HEENT: Unremarkable Neck:  No JVD, no thyromegally Lymphatics:  No adenopathy Back:  No CVA tenderness Lungs:  Clear with no wheezes, rales, or rhonchi. HEART:  Regular rate rhythm, no murmurs, no rubs, no clicks Abd:  soft, positive bowel sounds, no organomegally, no rebound, no guarding Ext:  2 plus pulses, no edema, no cyanosis, no clubbing Skin:  No rashes no nodules Neuro:  CN II through XII intact, motor grossly intact  EKG Normal sinus rhythm with left atrial enlargement, left axis deviation, and Anteroseptal infarction pattern is present.  Assess/Plan:

## 2011-09-24 NOTE — Patient Instructions (Signed)
Your physician recommends that you schedule a follow-up appointment as needed with Dr Taylor  

## 2011-09-24 NOTE — Assessment & Plan Note (Signed)
Her blood pressure today is well controlled. She will continue her current medical therapy. She will maintain a low-sodium diet.

## 2011-09-24 NOTE — Assessment & Plan Note (Signed)
She is doing well, status post catheter ablation. She will call us if she has recurrent tachycardia palpitations.

## 2011-10-02 NOTE — Progress Notes (Signed)
Addended by: Judithe Modest D on: 10/02/2011 04:12 PM   Modules accepted: Orders

## 2011-10-16 ENCOUNTER — Ambulatory Visit (INDEPENDENT_AMBULATORY_CARE_PROVIDER_SITE_OTHER): Payer: Medicare Other | Admitting: Cardiology

## 2011-10-16 ENCOUNTER — Encounter: Payer: Self-pay | Admitting: Cardiology

## 2011-10-16 VITALS — BP 138/82 | HR 84 | Ht 65.0 in | Wt 242.0 lb

## 2011-10-16 DIAGNOSIS — I2510 Atherosclerotic heart disease of native coronary artery without angina pectoris: Secondary | ICD-10-CM

## 2011-10-16 DIAGNOSIS — I10 Essential (primary) hypertension: Secondary | ICD-10-CM

## 2011-10-16 DIAGNOSIS — I471 Supraventricular tachycardia: Secondary | ICD-10-CM

## 2011-10-16 DIAGNOSIS — I498 Other specified cardiac arrhythmias: Secondary | ICD-10-CM

## 2011-10-16 DIAGNOSIS — E785 Hyperlipidemia, unspecified: Secondary | ICD-10-CM

## 2011-10-16 MED ORDER — MAGNESIUM OXIDE 400 MG PO TABS: ORAL_TABLET | ORAL | Status: DC

## 2011-10-16 NOTE — Patient Instructions (Signed)
Your physician wants you to follow-up in: 6 months.   You will receive a reminder letter in the mail two months in advance. If you don't receive a letter, please call our office to schedule the follow-up appointment.  Your physician has recommended you make the following change in your medication: Start taking Magnesium Oxide 200-400mg  daily (available over the counter)  Have Dr Charm Barges fax a copy of your labs to Dr Shirlee Latch next time they are done.  Fax= [phone]

## 2011-10-17 NOTE — Assessment & Plan Note (Signed)
LDL was above goal in 12/12 (goal < 70).  She is taking Crestor 40 mg daily.  She will get labs with her PCP in 4/13.  She will ask them to forward me a copy.

## 2011-10-17 NOTE — Assessment & Plan Note (Signed)
Status post ablation.  No recurrent symptoms. 

## 2011-10-17 NOTE — Progress Notes (Signed)
PCP: Dr. Charm Barges  66 yo with history of CAD and AVNRT s/p ablation presents for cardiology followup.  She has been seen by Dr. Gala Romney in the past and is seen by me for the first time today.  She had PCI to the LAD in 10/10.  In 12/12, she was admitted to [facility] with SVT and associated chest pain and elevated troponin.  Cath in 12/12 showed patent LAD stents.  She had AVNRT ablation earlier this month.  She has been stable clinically.  No chest pain.  No tachypalpitations.  She does get short of breath chronically after walking 1/4 mile or going up a flight of steps.  She exercises at the [facility] several times a week.    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 Obesity 3. HTN 4. GERD 5. OSA: Mild, not on CPAP 6. Psoriasis 7. CAD: Xience DES x 3 to LAD in 10/10.  Myoview 8/11 with EF 67%, no ischemia.  Echo (11/12): EF 55-60%, mild LVH, moderate LAE.  LHC (12/12): Patent LAD stents.   FH: CHF  SH: Married, lives in [place].  3 children.  Nonsmoker.  Retired.   ROS: All systems reviewed and negative except as per HPI.   Current Outpatient Prescriptions  Medication Sig Dispense Refill  . aspirin 81 MG tablet Take 81 mg by mouth daily.       Marland Kitchen CALCIUM CARBONATE-VITAMIN D PO Take 1 tablet by mouth 2 (two) times daily.       . fish oil-omega-3 fatty acids 1000 MG capsule Take 1 g by mouth 2 (two) times daily.       Marland Kitchen lisinopril (PRINIVIL,ZESTRIL) 20 MG tablet Take 20 mg by mouth daily.       . nitroGLYCERIN (NITROSTAT) 0.4 MG SL tablet Place 0.4 mg under the tongue every 5 (five) minutes as needed. For chest pain      . potassium chloride (K-DUR) 10 MEQ tablet Take 10 mEq by mouth daily.      . rosuvastatin (CRESTOR) 40 MG tablet Take 40 mg by mouth daily.      Marland Kitchen spironolactone (ALDACTONE) 25 MG tablet Take 25 mg by mouth daily.      . magnesium oxide (MAG-OX 400) 400 MG tablet Take 200mg -400mg  by mouth daily  200 tablet  1     BP 138/82  Pulse 84  Ht 5\' 5"  (1.651 m)  Wt 242 lb (109.77 kg)  BMI 40.27 kg/m2 General: NAD, obese.  Neck: No JVD, no thyromegaly or thyroid nodule.  Lungs: Clear to auscultation bilaterally with normal respiratory effort. CV: Nondisplaced PMI.  Heart regular S1/S2, no S3/S4, no murmur.  No peripheral edema.  No carotid bruit.  Normal pedal pulses.  Abdomen: Soft, nontender, no hepatosplenomegaly, no distention.  Neurologic: Alert and oriented x 3.  Psych: Normal affect. Extremities: No clubbing or cyanosis.

## 2011-10-17 NOTE — Assessment & Plan Note (Signed)
No ischemic symptoms.  Had cath in 12/12 without obstructive disease.  Continue ASA 81, ACEI, statin.

## 2011-10-17 NOTE — Assessment & Plan Note (Signed)
BP is under control.  She is on spironolactone so will need to follow K carefully.

## 2011-10-22 ENCOUNTER — Telehealth: Payer: Self-pay | Admitting: Internal Medicine

## 2011-10-22 ENCOUNTER — Other Ambulatory Visit: Payer: Self-pay | Admitting: *Deleted

## 2011-10-23 NOTE — Telephone Encounter (Signed)
Pt with hx of GERD, Obesity, Rectocele repair, Psoriasis with methotrexate therapy, IBS and numerous other medical issues.. After 11/22/2006 OV, pt was to schedule an EGD, but I find no record of it in EPIC. Chart states she had an ECL 6.14.2004 with GERD, Diverticulosis and external hemorrhoids.  Dr Silvana Newness ofc contacted and given an appt with Willette Cluster, NP for 10/25/11.

## 2011-10-25 ENCOUNTER — Encounter: Payer: Self-pay | Admitting: Cardiology

## 2011-10-25 ENCOUNTER — Ambulatory Visit (INDEPENDENT_AMBULATORY_CARE_PROVIDER_SITE_OTHER): Payer: Medicare Other | Admitting: Nurse Practitioner

## 2011-10-25 ENCOUNTER — Encounter: Payer: Self-pay | Admitting: Gastroenterology

## 2011-10-25 ENCOUNTER — Encounter: Payer: Self-pay | Admitting: Nurse Practitioner

## 2011-10-25 VITALS — BP 118/70 | HR 75 | Ht 65.0 in | Wt 237.8 lb

## 2011-10-25 DIAGNOSIS — K649 Unspecified hemorrhoids: Secondary | ICD-10-CM

## 2011-10-25 DIAGNOSIS — R112 Nausea with vomiting, unspecified: Secondary | ICD-10-CM

## 2011-10-25 DIAGNOSIS — R197 Diarrhea, unspecified: Secondary | ICD-10-CM | POA: Insufficient documentation

## 2011-10-25 DIAGNOSIS — K625 Hemorrhage of anus and rectum: Secondary | ICD-10-CM

## 2011-10-25 MED ORDER — HYOSCYAMINE SULFATE 0.125 MG SL SUBL: 0.1250 mg | SUBLINGUAL_TABLET | SUBLINGUAL | Status: DC | PRN

## 2011-10-25 MED ORDER — PEG-KCL-NACL-NASULF-NA ASC-C 100 G PO SOLR: 1.0000 | Freq: Once | ORAL | Status: DC

## 2011-10-25 MED ORDER — ALIGN 4 MG PO CAPS: 1.0000 | ORAL_CAPSULE | Freq: Every day | ORAL | Status: DC

## 2011-10-25 MED ORDER — HYDROCORTISONE ACETATE 25 MG RE SUPP: 25.0000 mg | Freq: Every day | RECTAL | Status: DC

## 2011-10-25 NOTE — Progress Notes (Signed)
10/25/2011 Sara [name] [id number] 1945/12/16   HISTORY OF PRESENT ILLNESS: Patient is a 66 year old female seen by Dr. Leone Payor in 2008 for GERD and diverticulosis. She takes daily Omeprazole. Patient referred here by Dr. Charm Barges office for rectal bleeding. Over the last couple of months patient has had intermittent episodes of nausea, vomiting and crampy diarrhea. No associated fevers or weight loss. She is fine in between these episodes. Her last episode was a week ago and she had some associated rectal bleeding with mucous and clots. Patient hasn't made any recent dietary changes, no medication changes.    Past Medical History  Diagnosis Date  . HTN (hypertension)   . Hyperlipidemia   . GERD (gastroesophageal reflux disease)   . CAD (coronary artery disease)     s/p Xience DES x 3 to LAD;  nuc study 02/27/10: EF 67% no ischemia; echo 11/12: Mild LVH, EF 55-65%, grade 1 diastolic dysfunction, moderate LAE;LHC 07/03/11: LAD stents patent, RCA 25%, EF 55-65%     . Obesity   . OSA (obstructive sleep apnea)   . Other psoriasis   . Arthritis   . Hemorrhoid   . Jaundice   . Diverticular disease   . Vertigo   . Shortness of breath   . Myocardial infarction   . AVNRT (AV nodal re-entry tachycardia)   . Bronchitis    Past Surgical History  Procedure Date  . Tubal ligation   . Knee surgery     right  . Back surgery     Dr. Magdalene Patricia  . Cardiac catheterization 08/23/2011    Ablation AV  Node  . Abdominal hysterectomy     reports that she has never smoked. She has never used smokeless tobacco. She reports that she does not drink alcohol or use illicit drugs. family history includes Aneurysm in her father; Heart failure in her mother; Hypertension in her mother; Muscular dystrophy in her daughter; Seizures in her brother and son; and Stroke in her mother.  There is no history of Colon cancer. Allergies  Allergen Reactions  . Tape     bleeding  . Codeine Other (See Comments)    unknown  .  Nifedipine Other (See Comments)    unknown      Outpatient Encounter Prescriptions as of 10/25/2011  Medication Sig Dispense Refill  . aspirin 81 MG tablet Take 81 mg by mouth daily.       Marland Kitchen CALCIUM CARBONATE-VITAMIN D PO Take 1 tablet by mouth 2 (two) times daily.       . fish oil-omega-3 fatty acids 1000 MG capsule Take 1 g by mouth 2 (two) times daily.       Marland Kitchen lisinopril (PRINIVIL,ZESTRIL) 20 MG tablet Take 20 mg by mouth daily.       . magnesium oxide (MAG-OX 400) 400 MG tablet Take 200mg -400mg  by mouth daily  200 tablet  1  . nitroGLYCERIN (NITROSTAT) 0.4 MG SL tablet Place 0.4 mg under the tongue every 5 (five) minutes as needed. For chest pain      . potassium chloride (K-DUR) 10 MEQ tablet Take 10 mEq by mouth daily.      . rosuvastatin (CRESTOR) 40 MG tablet Take 40 mg by mouth daily.      Marland Kitchen spironolactone (ALDACTONE) 25 MG tablet Take 25 mg by mouth daily.         REVIEW OF SYSTEMS  : Positive for back pain, arthritis, heart rhythm changes,some shortness of breath. All other systems reviewed and  negative except where noted in the History of Present Illness.   PHYSICAL EXAM: BP 118/70  Pulse 75  Ht 5\' 5"  (1.651 m)  Wt 237 lb 12.8 oz (107.865 kg)  BMI 39.57 kg/m2 General: Well developed white female in no acute distress Head: Normocephalic and atraumatic Eyes:  sclerae anicteric,conjunctive pink. Ears: Normal auditory acuity Mouth: No deformity or lesions Neck: Supple, no masses.  Lungs: Clear throughout to auscultation Heart: Regular rate and rhythm Abdomen: Soft, non distended, nontender. No masses or hepatomegaly noted. Normal Bowel sounds Rectal: light brown, soft, heme negative stool. Internal hemorrhoids on anoscopy. Musculoskeletal: Symmetrical with no gross deformities  Skin: No lesions on visible extremities Extremities: No edema or deformities noted Neurological: Alert oriented, grossly nonfocal Cervical Nodes:  No significant cervical  adenopathy Psychological:  Alert and cooperative. Normal mood and affect  ASSESSMENT AND PLAN;  45. 66 year old female with a two month history of intermittent episodes of nausea, vomiting, and crampy diarrhea.  Overall, she is fine in between these episodes with normal, formed stools. Infectious etiology seems unlikely as does bowel obstruction. Patient has a history of IBS per our 2008 office note but patient seemed a bit surprised by this. The cause of her symptoms is not clear but these episodes are debilitating for her. Currently her stools are formed. For further evaluation the patient will be scheduled for an upper and lower endoscopy. The risks, benefits, and alternatives to EGD and colonoscopy with possible biopsy and possible polypectomy were discussed with the patient and she agrees to proceed. In meantime, I have given her some Align to try. She will try Levsin SL for cramps. 2. Rectal bleeding. She had rectal bleeding with mucous and clots with her last episode of #1. She has internal hemorrhoids on exam which may have been source of bleeding. Will try Anusol HC suppositories. Further recommendations at time of colonoscopy.  3. GERD, asymptomatic on daily PPI.

## 2011-10-25 NOTE — Patient Instructions (Addendum)
You have been scheduled for an endoscopy and colonoscopy with propofol. Please follow the written instructions given to you at your visit today. Please pick up your prep at the pharmacy within the next 1-3 days. We have given you samples of Align. This puts good bacteria back into your intestines. You should take 1 capsule by mouth once daily. If this works well for you, it can be purchased over the counter. We have sent the following medications to your pharmacy for you to pick up at your convenience:Levsin, Anusol suppositories, Movi prep. Call back if your symptoms fail to improve at all or get worse before your procedures. cc: Samuel Jester, MD

## 2011-10-26 ENCOUNTER — Other Ambulatory Visit (HOSPITAL_COMMUNITY): Payer: Self-pay | Admitting: Internal Medicine

## 2011-10-26 ENCOUNTER — Encounter: Payer: Self-pay | Admitting: Nurse Practitioner

## 2011-11-16 ENCOUNTER — Ambulatory Visit (AMBULATORY_SURGERY_CENTER): Payer: Medicare Other | Admitting: Internal Medicine

## 2011-11-16 ENCOUNTER — Encounter: Payer: Self-pay | Admitting: Internal Medicine

## 2011-11-16 VITALS — BP 119/81 | HR 83 | Temp 95.8°F | Resp 18 | Ht 65.0 in | Wt 237.0 lb

## 2011-11-16 DIAGNOSIS — K625 Hemorrhage of anus and rectum: Secondary | ICD-10-CM

## 2011-11-16 DIAGNOSIS — R112 Nausea with vomiting, unspecified: Secondary | ICD-10-CM

## 2011-11-16 DIAGNOSIS — K649 Unspecified hemorrhoids: Secondary | ICD-10-CM

## 2011-11-16 DIAGNOSIS — K5730 Diverticulosis of large intestine without perforation or abscess without bleeding: Secondary | ICD-10-CM

## 2011-11-16 DIAGNOSIS — R197 Diarrhea, unspecified: Secondary | ICD-10-CM

## 2011-11-16 MED ORDER — SODIUM CHLORIDE 0.9 % IV SOLN: 500.0000 mL | INTRAVENOUS | Status: DC

## 2011-11-16 NOTE — Patient Instructions (Signed)

## 2011-11-16 NOTE — Progress Notes (Signed)
Patient did not have preoperative order for IV antibiotic SSI prophylaxis. (G8918)  Patient did not experience any of the following events: a burn prior to discharge; a fall within the facility; wrong site/side/patient/procedure/implant event; or a hospital transfer or hospital admission upon discharge from the facility. (G8907)  

## 2011-11-16 NOTE — Op Note (Addendum)
[facility] [address] [place], [place]  [address]  COLONOSCOPY PROCEDURE REPORT  PATIENT:  Sara [name], Sara [name]  MR#:  [id number] BIRTHDATE:  1945/11/01, 65 yrs. old  GENDER:  female ENDOSCOPIST:  Iva Boop, MD, [facility]  PROCEDURE DATE:  11/16/2011 PROCEDURE:  Colonoscopy 19147 ASA CLASS:  Class II INDICATIONS:  unexplained diarrhea, rectal bleeding MEDICATIONS:   There was residual sedation effect present from prior procedure., These medications were titrated to patient response per physician's verbal order, Versed 5 mg IV, Fentanyl 50 mcg IV  DESCRIPTION OF PROCEDURE:   After the risks benefits and alternatives of the procedure were thoroughly explained, informed consent was obtained.  Digital rectal exam was performed and revealed no abnormalities.   The LB [id number]-H180AL [id number] endoscope was introduced through the anus and advanced to the terminal ileum which was intubated for a short distance, without limitations. The quality of the prep was excellent, using MoviPrep.  The instrument was then slowly withdrawn as the colon was fully examined. <<PROCEDUREIMAGES>>  FINDINGS:  Moderate diverticulosis was found in the sigmoid colon. This was otherwise a normal examination of the colon and terminal ileum, including right colon retroflexion.  Retroflexed views in the rectum revealed internal hemorrhoids.    The time to cecum = 3:00 minutes. The scope was then withdrawn in 11:13  minutes from the cecum and the procedure completed. COMPLICATIONS:  None ENDOSCOPIC IMPRESSION: 1) Moderate diverticulosis in the sigmoid colon 2) Internal hemorrhoids 3) Otherwise normal examination, excellent prep RECOMMENDATIONS: Continue intermittent hyoscyamine. follow-up GI as needed Nausea/vomiting and diarrhea have resolved REPEAT EXAM:  In 10 year(s) for routine screening colonoscopy.  Iva Boop, MD, [facility]  CC:  Samuel Jester, DO and The Patient Addendum: cc: Dr. Tracey Harries  n. REVISED:  11/16/2011 03:42 PM eSIGNED:   Iva Boop at 11/16/2011 03:42 PM  [place], [place], [phone]

## 2011-11-16 NOTE — Op Note (Addendum)
 [facility] [address] [place], [place]  [address]  ENDOSCOPY PROCEDURE REPORT  PATIENT:  Sara, [name]  MR#:  [id number] BIRTHDATE:  1946-03-29, 65 yrs. old  GENDER:  female  ENDOSCOPIST:  Iva Boop, MD, [facility]  PROCEDURE DATE:  11/16/2011 PROCEDURE:  EGD, diagnostic [phone] ASA CLASS:  Class III INDICATIONS:  nausea and vomiting, diarrhea  MEDICATIONS:   These medications were titrated to patient response per physician's verbal order, Fentanyl 50 mcg IV, Versed 5 mg IV TOPICAL ANESTHETIC:  Cetacaine Spray  DESCRIPTION OF PROCEDURE:   After the risks and benefits of the procedure were explained, informed consent was obtained.  The LB GIF-H180 [id number] endoscope was introduced through the mouth and advanced to the second portion of the duodenum.  The instrument was slowly withdrawn as the mucosa was fully examined. <<PROCEDUREIMAGES>>  A hiatal hernia was found. It was 3 cm in size (see image3). Otherwise the examination was normal (see image1, image2, and image4).    Retroflexed views revealed no abnormalities.    The scope was then withdrawn from the patient and the procedure completed.  COMPLICATIONS:  None  ENDOSCOPIC IMPRESSION: 1) 3 cm hiatal hernia 2) Otherwise normal examination RECOMMENDATIONS: Colonoscopy next  Iva Boop, MD, [facility]  CC:  Samuel Jester, DO The Patient Addendum: cc: Dr. Tracey Harries  n. REVISED:  11/16/2011 03:43 PM eSIGNED:   Iva Boop at 11/16/2011 03:43 PM  [place], [place], [phone]

## 2011-11-19 ENCOUNTER — Telehealth: Payer: Self-pay | Admitting: *Deleted

## 2011-11-19 NOTE — Telephone Encounter (Signed)
No answer. Message left on identified phone number voice mail to call if any questions or concerns post procedure.

## 2011-11-21 ENCOUNTER — Encounter: Payer: Self-pay | Admitting: *Deleted

## 2012-02-09 ENCOUNTER — Other Ambulatory Visit (HOSPITAL_COMMUNITY): Payer: Self-pay | Admitting: Internal Medicine

## 2012-04-07 ENCOUNTER — Telehealth: Payer: Self-pay | Admitting: *Deleted

## 2012-04-07 MED ORDER — NIACIN ER (ANTIHYPERLIPIDEMIC) 500 MG PO TBCR: 500.0000 mg | EXTENDED_RELEASE_TABLET | Freq: Every day | ORAL | Status: DC

## 2012-04-07 NOTE — Telephone Encounter (Signed)
Dr Shirlee Latch received copy of lab done 03/14/12 at Dr Charm Barges. LDL-P 1069  small LDL-P 575  LDL-c 101--HDL-c 51 Trig 122 --Dr Shirlee Latch recommended pt start Niaspan 500mg  hs 30 minutes after aspirin. If tolerates increase to 1000mg  daily with lipids in 2 months. I spoke with pt. She agreed to begin Niaspan 500mg  hs. I have scheduled an appt for her with Dr Shirlee Latch 05/01/12. She will discuss further adjustment of medication with Dr Shirlee Latch at time of the appt with him 05/01/12.

## 2012-04-28 ENCOUNTER — Other Ambulatory Visit: Payer: Self-pay | Admitting: Obstetrics and Gynecology

## 2012-04-28 DIAGNOSIS — Z1231 Encounter for screening mammogram for malignant neoplasm of breast: Secondary | ICD-10-CM

## 2012-05-01 ENCOUNTER — Ambulatory Visit (INDEPENDENT_AMBULATORY_CARE_PROVIDER_SITE_OTHER): Payer: Medicare Other | Admitting: Cardiology

## 2012-05-01 ENCOUNTER — Encounter: Payer: Self-pay | Admitting: Cardiology

## 2012-05-01 VITALS — BP 124/76 | HR 96 | Ht 65.0 in | Wt 244.0 lb

## 2012-05-01 DIAGNOSIS — E785 Hyperlipidemia, unspecified: Secondary | ICD-10-CM

## 2012-05-01 DIAGNOSIS — I2510 Atherosclerotic heart disease of native coronary artery without angina pectoris: Secondary | ICD-10-CM

## 2012-05-01 DIAGNOSIS — I10 Essential (primary) hypertension: Secondary | ICD-10-CM

## 2012-05-01 NOTE — Patient Instructions (Addendum)
Your physician recommends that you return for a FASTING lipid profile /liver profile /BMET in about 6 weeks.   Your physician wants you to follow-up in: 6 months with Dr Shirlee Latch. (April 2014).  You will receive a reminder letter in the mail two months in advance. If you don't receive a letter, please call our office to schedule the follow-up appointment.

## 2012-05-02 NOTE — Progress Notes (Signed)
Patient ID: Sara [name], female   DOB: 10/20/1945, 66 y.o.   MRN: [id number] PCP: Dr. Charm Barges  66 yo with history of CAD and AVNRT s/p ablation presents for cardiology followup.  She had PCI to the LAD in 10/10.  In 12/12, she was admitted to [facility] with SVT and associated chest pain and elevated troponin.  Cath in 12/12 showed patent LAD stents.  She had AVNRT ablation 3/12.  She has been stable clinically.  No chest pain.  Occasional mild fluttering but no long tachypalpitations.  She does get short of breath chronically after walking 1/4 mile or going up a flight of steps.  She exercises at the [facility] several times a week and is now taking Zumba classes.  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 Obesity 3. HTN 4. GERD 5. OSA: Mild, not on CPAP 6. Psoriasis 7. CAD: Xience DES x 3 to LAD in 10/10.  Myoview 8/11 with EF 67%, no ischemia.  Echo (11/12): EF 55-60%, mild LVH, moderate LAE.  LHC (12/12): Patent LAD stents.  8. Diverticulosis  FH: CHF  SH: Married, lives in [place].  3 children.  Nonsmoker.  Retired.     Current Outpatient Prescriptions  Medication Sig Dispense Refill  . aspirin 81 MG tablet Take 81 mg by mouth daily.       Marland Kitchen CALCIUM CARBONATE-VITAMIN D PO Take 1 tablet by mouth 2 (two) times daily.       . CRESTOR 40 MG tablet TAKE 1 TABLET DAILY  30 each  12  . cyclobenzaprine (FLEXERIL) 10 MG tablet as needed.      . fish oil-omega-3 fatty acids 1000 MG capsule Take 1 g by mouth 2 (two) times daily.       Marland Kitchen HYDROcodone-acetaminophen (VICODIN) 5-500 MG per tablet as needed.      . hydrocortisone (ANUSOL-HC) 25 MG suppository Place 25 mg rectally as needed.      . hyoscyamine (LEVSIN/SL) 0.125 MG SL tablet Place 1 tablet (0.125 mg total) under the tongue every 4 (four) hours as needed for cramping.  60 tablet  11  .  KLOR-CON 10 10 MEQ tablet TAKE 1 TABLET DAILY  30 each  12  . lisinopril (PRINIVIL,ZESTRIL) 20 MG tablet Take 20 mg by mouth daily.       . magnesium oxide (MAG-OX 400) 400 MG tablet Take 200mg -400mg  by mouth daily  200 tablet  1  . nitroGLYCERIN (NITROSTAT) 0.4 MG SL tablet Place 0.4 mg under the tongue every 5 (five) minutes as needed. For chest pain      . omeprazole (PRILOSEC) 20 MG capsule Take 1 tablet by mouth daily.      Marland Kitchen spironolactone (ALDACTONE) 25 MG tablet Take 25 mg by mouth daily.        BP 124/76  Pulse 96  Ht 5\' 5"  (1.651 m)  Wt 244 lb (110.678 kg)  BMI 40.60 kg/m2 General: NAD, obese.  Neck: No JVD, no thyromegaly or thyroid nodule.  Lungs: Clear to auscultation bilaterally with normal respiratory effort. CV: Nondisplaced PMI.  Heart regular S1/S2, no S3/S4, no murmur.  No peripheral edema.  No carotid bruit.  Normal pedal pulses.  Abdomen: Soft, nontender, no hepatosplenomegaly, no distention.  Neurologic: Alert and oriented x 3.  Psych: Normal affect. Extremities: No clubbing or cyanosis.  Assessment/Plan:  AVNRT (AV nodal re-entry tachycardia) Status post ablation in 3/13. No recurrent symptoms.  CAD  No ischemic symptoms. Had cath in 12/12 without obstructive disease. Continue ASA 81, ACEI, statin.  HYPERTENSION  BP is under control. She is on spironolactone so will need to follow K carefully.  HYPERLIPIDEMIA  She had only been taking 1/2 of her Crestor daily.  She recently started taking the whole dose again.  Repeat lipids in 6 wks. Goal LDL < 70.   Sara [name] [employer]

## 2012-06-10 ENCOUNTER — Other Ambulatory Visit: Payer: Medicare Other

## 2012-06-10 ENCOUNTER — Other Ambulatory Visit (INDEPENDENT_AMBULATORY_CARE_PROVIDER_SITE_OTHER): Payer: Medicare Other

## 2012-06-10 DIAGNOSIS — I2510 Atherosclerotic heart disease of native coronary artery without angina pectoris: Secondary | ICD-10-CM

## 2012-06-10 LAB — HEPATIC FUNCTION PANEL
ALT: 19 U/L (ref 0–35)
AST: 24 U/L (ref 0–37)
Bilirubin, Direct: 0.2 mg/dL (ref 0.0–0.3)
Total Bilirubin: 1 mg/dL (ref 0.3–1.2)

## 2012-06-10 LAB — BASIC METABOLIC PANEL
Chloride: 105 mEq/L (ref 96–112)
Creatinine, Ser: 0.7 mg/dL (ref 0.4–1.2)
GFR: 83.19 mL/min (ref >60.00)
Potassium: 4.8 mEq/L (ref 3.5–5.1)

## 2012-06-10 LAB — LIPID PANEL
LDL Cholesterol: 101 mg/dL — ABNORMAL HIGH (ref 0–99)
Total CHOL/HDL Ratio: 3
VLDL: 22.8 mg/dL (ref 0.0–40.0)

## 2012-06-24 ENCOUNTER — Ambulatory Visit
Admission: RE | Admit: 2012-06-24 | Discharge: 2012-06-24 | Disposition: A | Discharge status: Home / Self Care | Payer: Medicare Other | Source: Ambulatory Visit | Attending: Obstetrics and Gynecology | Admitting: Obstetrics and Gynecology

## 2012-06-24 DIAGNOSIS — Z1231 Encounter for screening mammogram for malignant neoplasm of breast: Secondary | ICD-10-CM

## 2012-10-07 ENCOUNTER — Other Ambulatory Visit: Payer: Self-pay | Admitting: Obstetrics and Gynecology

## 2012-10-07 DIAGNOSIS — N644 Mastodynia: Secondary | ICD-10-CM

## 2012-10-17 ENCOUNTER — Ambulatory Visit
Admission: RE | Admit: 2012-10-17 | Discharge: 2012-10-17 | Disposition: A | Discharge status: Home / Self Care | Payer: Medicare Other | Source: Ambulatory Visit | Attending: Obstetrics and Gynecology | Admitting: Obstetrics and Gynecology

## 2012-10-17 DIAGNOSIS — N644 Mastodynia: Secondary | ICD-10-CM

## 2012-11-18 ENCOUNTER — Encounter: Payer: Self-pay | Admitting: Cardiology

## 2012-11-24 ENCOUNTER — Other Ambulatory Visit: Payer: Self-pay | Admitting: *Deleted

## 2012-11-25 ENCOUNTER — Other Ambulatory Visit: Payer: Self-pay | Admitting: Internal Medicine

## 2012-12-05 ENCOUNTER — Other Ambulatory Visit: Payer: Self-pay | Admitting: Orthopedic Surgery

## 2012-12-05 MED ORDER — DEXAMETHASONE SODIUM PHOSPHATE 10 MG/ML IJ SOLN: 10.0000 mg | Freq: Once | INTRAMUSCULAR | Status: DC

## 2012-12-05 MED ORDER — BUPIVACAINE LIPOSOME 1.3 % IJ SUSP: 20.0000 mL | Freq: Once | INTRAMUSCULAR | Status: DC

## 2012-12-05 NOTE — Progress Notes (Signed)
Preoperative surgical orders have been place into the Epic hospital system for [facility] on 12/05/2012, 3:58 PM  by Patrica Duel for surgery on 12/22/2012.  Preop Total Knee orders including Experal, IV Tylenol, and IV Decadron as long as there are no contraindications to the above medications. Avel Peace, PA-C

## 2012-12-09 ENCOUNTER — Encounter (HOSPITAL_COMMUNITY): Payer: Self-pay | Admitting: Pharmacy Technician

## 2012-12-10 ENCOUNTER — Encounter: Payer: Self-pay | Admitting: Physician Assistant

## 2012-12-10 ENCOUNTER — Ambulatory Visit (INDEPENDENT_AMBULATORY_CARE_PROVIDER_SITE_OTHER): Payer: Medicare Other | Admitting: Physician Assistant

## 2012-12-10 VITALS — BP 104/68 | HR 69 | Ht 65.0 in | Wt 246.8 lb

## 2012-12-10 DIAGNOSIS — Z01810 Encounter for preprocedural cardiovascular examination: Secondary | ICD-10-CM

## 2012-12-10 DIAGNOSIS — R0602 Shortness of breath: Secondary | ICD-10-CM

## 2012-12-10 DIAGNOSIS — E785 Hyperlipidemia, unspecified: Secondary | ICD-10-CM

## 2012-12-10 DIAGNOSIS — I10 Essential (primary) hypertension: Secondary | ICD-10-CM

## 2012-12-10 DIAGNOSIS — I2510 Atherosclerotic heart disease of native coronary artery without angina pectoris: Secondary | ICD-10-CM

## 2012-12-10 DIAGNOSIS — R079 Chest pain, unspecified: Secondary | ICD-10-CM

## 2012-12-10 NOTE — Patient Instructions (Addendum)
PLEASE SCHEDULE FOR A LEXISCAN; DX SURGERY CLEARANCE; DATE OF SURGERY 12/22/12  LABS TODAY; BMET, BNP  Your physician wants you to follow-up in: 6 MONTHS WITH DR. Shirlee Latch. You will receive a reminder letter in the mail two months in advance. If you don't receive a letter, please call our office to schedule the follow-up appointment.   NO CHANGES WITH MEDICATIONS TODAY

## 2012-12-10 NOTE — Progress Notes (Signed)
[address], [address] [place], [place]  [address] Phone: [phone] Fax:  [phone]  Date:  12/10/2012   ID:  RICK WARNICK, DOB 1946/04/01, MRN [id number]  PCP:  Samuel Jester, DO  Primary Cardiologist:  Dr. Marca Ancona     History of Present Illness: Sara [name] is a 67 y.o. female who returns for surgical clearance.  She has a history of CAD, status post DES x3 the LAD in 04/2009, AVNRT, status post ablation, HTN, sleep apnea. She was admitted in 12/12 with SVT with associated chest pain and elevated troponin. Cardiac cath demonstrated patent LAD stents. She underwent RFCA in 09/2011.  Last seen by Dr. Shirlee Latch in 04/2012.  She now needs L TKR for DJD with Dr. Lequita Halt.  Surgery set for 6/2.  She notes DOE over the last year.  Notes NYHA class II-IIb symptoms.  Sleeps on 2 pillows chronically without change.  No PND.  No edema.  No syncope.  She does note chest soreness.  Thinks she has with exertion.  No radiation or assoc symptoms.  She had no CP prior to PCI in past.   Labs (12/12): LDL 100, HDl 43  Labs (1/13): K 4.3, creatinine 0.6  Labs (8/13): K 4.7, creatinine 0.75, LDL 101, LDL particle number [id number] Labs (11/13):  K 4.8, Cr 0.7, ALT 19, LDL 101, Hgb 12.1,   Wt Readings from Last 3 Encounters:  12/10/12 246 lb 12.8 oz (111.948 kg)  05/01/12 244 lb (110.678 kg)  11/16/11 237 lb (107.502 kg)     Past Medical History  Diagnosis Date  . HTN (hypertension)   . Hyperlipidemia   . GERD (gastroesophageal reflux disease)   . CAD (coronary artery disease)     s/p Xience DES x 3 to LAD;  nuc study 02/27/10: EF 67% no ischemia; echo 11/12: Mild LVH, EF 55-65%, grade 1 diastolic dysfunction, moderate LAE;LHC 07/03/11: LAD stents patent, RCA 25%, EF 55-65%     . Obesity   . OSA (obstructive sleep apnea)   . Other psoriasis   . Arthritis   . Hemorrhoid   . Jaundice   . Diverticular disease   . Vertigo   . Shortness of breath   . Myocardial infarction   . AVNRT  (AV nodal re-entry tachycardia)     s/p RFCA 09/2011  . Bronchitis     Current Outpatient Prescriptions  Medication Sig Dispense Refill  . aspirin 81 MG tablet Take 81 mg by mouth daily.       . Calcium Carbonate-Vitamin D (CALCIUM 600 + D PO) Take 1 tablet by mouth 2 (two) times daily.      . fish oil-omega-3 fatty acids 1000 MG capsule Take 1 g by mouth 2 (two) times daily.      Marland Kitchen HYDROcodone-acetaminophen (VICODIN) 5-500 MG per tablet Take 1 tablet by mouth every 6 (six) hours as needed for pain.       Marland Kitchen lisinopril (PRINIVIL,ZESTRIL) 20 MG tablet Take 20 mg by mouth every morning.       . Magnesium 250 MG TABS Take 250 mg by mouth daily.      . nitroGLYCERIN (NITROSTAT) 0.4 MG SL tablet Place 0.4 mg under the tongue every 5 (five) minutes as needed. For chest pain      . omeprazole (PRILOSEC) 20 MG capsule Take 20 mg by mouth daily.       . potassium chloride (K-DUR,KLOR-CON) 10 MEQ tablet Take 10 mEq by mouth daily.      Marland Kitchen  rosuvastatin (CRESTOR) 40 MG tablet Take 40 mg by mouth at bedtime.       . sodium chloride (OCEAN) 0.65 % nasal spray Place 1 spray into the nose at bedtime as needed for congestion.      Marland Kitchen spironolactone (ALDACTONE) 25 MG tablet Take 12.5 mg by mouth 2 (two) times daily.        No current facility-administered medications for this visit.    Allergies:    Allergies  Allergen Reactions  . Tape     bleeding  . Codeine Other (See Comments)    unknown  . Nifedipine Other (See Comments)    Brings blood pressure up really fast    Social History:  The patient  reports that she has never smoked. She has never used smokeless tobacco. She reports that she does not drink alcohol or use illicit drugs.   ROS:  Please see the history of present illness.   Had recent URI symptoms, now resolved.   All other systems reviewed and negative.   PHYSICAL EXAM: VS:  BP 104/68  Pulse 69  Ht 5\' 5"  (1.651 m)  Wt 246 lb 12.8 oz (111.948 kg)  BMI 41.07 kg/m2 Well nourished,  well developed, in no acute distress HEENT: normal Neck: no JVD Cardiac:  normal S1, S2; RRR; no murmur Lungs:  clear to auscultation bilaterally, no wheezing, rhonchi or rales Abd: soft, nontender, no hepatomegaly Ext: no edema Skin: warm and dry Neuro:  CNs 2-12 intact, no focal abnormalities noted  EKG:  NSR, HR 69, LBBB     ASSESSMENT AND PLAN:  1. Surgical Clearance:  She notes increased dyspnea with exertion over the last year. She also has atypical chest pain. She has a history of CAD with prior PCI. Last cardiac catheterization was in 2012. I have recommended proceeding with stress testing prior to clearing her for surgery. I will arrange a Lexiscan Myoview. If this is low risk, no further workup prior to her noncardiac surgery. 2. Dyspnea: Probably multifactorial. Proceed with stress testing as noted. She does have a history of mild diastolic dysfunction. Check a basic metabolic panel and BNP. Consider adding furosemide if her BNP is significantly elevated. 3. CAD: Continue aspirin and statin. Proceed with stress testing as noted. With her history of drug-eluting stent to the LAD, would favor her remaining on aspirin throughout the perioperative period. 4. Hypertension: Controlled. 5. Hyperlipidemia: Continue statin. 6. Disposition: Followup with Dr. Shirlee Latch in 6 months.  Luna Glasgow, PA-C  3:48 PM 12/10/2012

## 2012-12-11 LAB — BASIC METABOLIC PANEL WITH GFR
BUN: 13 mg/dL (ref 6–23)
CO2: 27 meq/L (ref 19–32)
Calcium: 8.9 mg/dL (ref 8.4–10.5)
Chloride: 107 meq/L (ref 96–112)
Creatinine, Ser: 0.8 mg/dL (ref 0.4–1.2)
GFR: 74.84 mL/min (ref >60.00)
Glucose, Bld: 88 mg/dL (ref 70–99)
Potassium: 4.2 meq/L (ref 3.5–5.1)
Sodium: 140 meq/L (ref 135–145)

## 2012-12-12 NOTE — Patient Instructions (Addendum)
MARTI MCLANE  12/12/2012   Your procedure is scheduled on:  12/22/12   Report to [facility] at     0940 AM.  Call this number if you have problems the morning of surgery: [phone]   Remember:   Do not eat food or drink liquids after midnight.   Take these medicines the morning of surgery with A SIP OF WATER:    Do not wear jewelry, make-up or nail polish.  Do not wear lotions, powders, or perfumes.   Do not shave 48 hours prior to surgery.   Do not bring valuables to the hospital.  Contacts, dentures or bridgework may not be worn into surgery.  Leave suitcase in the car. After surgery it may be brought to your room.  For patients admitted to the hospital, checkout time is 11:00 AM the day of  discharge.   :   SEE CHG INSTRUCTION SHEET    Please read over the following fact sheets that you were given: MRSA Information, coughing and deep breathing exercises, leg exercises, Blood Transfusion Fact sheet, Incentive Spirometry Fact Sheet                Failure to comply with these instructions may result in cancellation of your surgery.                Patient Signature ____________________________              Nurse Signature _____________________________

## 2012-12-16 ENCOUNTER — Ambulatory Visit (HOSPITAL_BASED_OUTPATIENT_CLINIC_OR_DEPARTMENT_OTHER): Payer: Medicare Other | Admitting: Radiology

## 2012-12-16 ENCOUNTER — Encounter (HOSPITAL_COMMUNITY): Payer: Self-pay

## 2012-12-16 ENCOUNTER — Encounter (HOSPITAL_COMMUNITY)
Admission: RE | Admit: 2012-12-16 | Discharge: 2012-12-16 | Disposition: A | Discharge status: Home / Self Care | Payer: Medicare Other | Source: Ambulatory Visit | Attending: Orthopedic Surgery | Admitting: Orthopedic Surgery

## 2012-12-16 ENCOUNTER — Ambulatory Visit (HOSPITAL_COMMUNITY)
Admission: RE | Admit: 2012-12-16 | Discharge: 2012-12-16 | Disposition: A | Discharge status: Home / Self Care | Payer: Medicare Other | Source: Ambulatory Visit | Attending: Orthopedic Surgery | Admitting: Orthopedic Surgery

## 2012-12-16 VITALS — BP 125/67 | HR 76 | Ht 65.0 in | Wt 247.0 lb

## 2012-12-16 DIAGNOSIS — R0789 Other chest pain: Secondary | ICD-10-CM | POA: Insufficient documentation

## 2012-12-16 DIAGNOSIS — R0609 Other forms of dyspnea: Secondary | ICD-10-CM | POA: Insufficient documentation

## 2012-12-16 DIAGNOSIS — I2510 Atherosclerotic heart disease of native coronary artery without angina pectoris: Secondary | ICD-10-CM

## 2012-12-16 DIAGNOSIS — R079 Chest pain, unspecified: Secondary | ICD-10-CM

## 2012-12-16 DIAGNOSIS — Z01810 Encounter for preprocedural cardiovascular examination: Secondary | ICD-10-CM

## 2012-12-16 DIAGNOSIS — R0989 Other specified symptoms and signs involving the circulatory and respiratory systems: Secondary | ICD-10-CM | POA: Insufficient documentation

## 2012-12-16 DIAGNOSIS — I10 Essential (primary) hypertension: Secondary | ICD-10-CM | POA: Insufficient documentation

## 2012-12-16 DIAGNOSIS — R0602 Shortness of breath: Secondary | ICD-10-CM

## 2012-12-16 DIAGNOSIS — Z01812 Encounter for preprocedural laboratory examination: Secondary | ICD-10-CM | POA: Insufficient documentation

## 2012-12-16 HISTORY — DX: Inflammatory liver disease, unspecified: K75.9

## 2012-12-16 LAB — COMPREHENSIVE METABOLIC PANEL
CO2: 27 mEq/L (ref 19–32)
Calcium: 9.7 mg/dL (ref 8.4–10.5)
Creatinine, Ser: 0.82 mg/dL (ref 0.50–1.10)
GFR calc Af Amer: 84 mL/min — ABNORMAL LOW (ref >90)
GFR calc non Af Amer: 72 mL/min — ABNORMAL LOW (ref >90)
Glucose, Bld: 125 mg/dL — ABNORMAL HIGH (ref 70–99)
Total Protein: 6.6 g/dL (ref 6.0–8.3)

## 2012-12-16 LAB — CBC
Hemoglobin: 14.1 g/dL (ref 12.0–15.0)
MCH: 31.7 pg (ref 26.0–34.0)
MCHC: 34.8 g/dL (ref 30.0–36.0)
MCV: 91 fL (ref 78.0–100.0)
RBC: 4.45 MIL/uL (ref 3.87–5.11)

## 2012-12-16 LAB — PROTIME-INR
INR: 1.06 (ref 0.00–1.49)
Prothrombin Time: 13.7 seconds (ref 11.6–15.2)

## 2012-12-16 LAB — URINALYSIS, ROUTINE W REFLEX MICROSCOPIC
Bilirubin Urine: NEGATIVE
Glucose, UA: NEGATIVE mg/dL
Hgb urine dipstick: NEGATIVE
Ketones, ur: NEGATIVE mg/dL
Protein, ur: NEGATIVE mg/dL

## 2012-12-16 LAB — SURGICAL PCR SCREEN: MRSA, PCR: NEGATIVE

## 2012-12-16 MED ORDER — ADENOSINE (DIAGNOSTIC) 3 MG/ML IV SOLN
0.5600 mg/kg | Freq: Once | INTRAVENOUS | Status: AC
  Administered 2012-12-16: 62.7 mg via INTRAVENOUS

## 2012-12-16 MED ORDER — TECHNETIUM TC 99M SESTAMIBI GENERIC - CARDIOLITE
11.0000 | Freq: Once | INTRAVENOUS | Status: AC | PRN
  Administered 2012-12-16: 11 via INTRAVENOUS

## 2012-12-16 MED ORDER — TECHNETIUM TC 99M SESTAMIBI GENERIC - CARDIOLITE
33.0000 | Freq: Once | INTRAVENOUS | Status: AC | PRN
  Administered 2012-12-16: 33 via INTRAVENOUS

## 2012-12-16 NOTE — Progress Notes (Signed)
[facility] SITE 3 NUCLEAR MED [address] [place], [place] [address] [phone]    Cardiology Nuclear Med Study  Sara [name] is a 67 y.o. female     MRN : [id number]     DOB: May 06, 1946  Procedure Date: 12/16/2012  Nuclear Med Background Indication for Stress Test:  Evaluation for Ischemia, Stent Patency and Surgical Clearance for Pending (L) TKR with Dr.  Trudee Grip on 12/22/12  History:  '10 Ablation; '10 Stents-LAD x 3; '11 MPS:no ischemia, EF=67%; '12 Echo:EF=60%; '12 Cath:patent stents, EF=65% Cardiac Risk Factors: Hypertension, Lipids and Obesity  Symptoms:  Chest Tightness with Exertion (last date of chest discomfort was about 3-4 weeks ago), DOE, Palpitations and SOB   Nuclear Pre-Procedure Caffeine/Decaff Intake:  7:00pm NPO After: 6:00am   Lungs:  Clear. O2 Sat: 95% on room air. IV 0.9% NS with Angio Cath:  22g  IV Site: R Hand , tolerated well IV Started by:  Irean Hong, RN  Chest Size (in):  44 Cup Size: DDD  Height: 5\' 5"  (1.651 m)  Weight:  247 lb (112.038 kg)  BMI:  Body mass index is 41.1 kg/(m^2). Tech Comments:  n/a    Nuclear Med Study 1 or 2 day study: 1 day  Stress Test Type:  Adenosine  Reading MD: Olga Millers, MD  Order Authorizing Provider: Marca Ancona, MD and Tereso Newcomer, PA-C  Resting Radionuclide: Technetium 6m Sestamibi  Resting Radionuclide Dose: 11.0 mCi   Stress Radionuclide:  Technetium 46m Sestamibi  Stress Radionuclide Dose: 33.0 mCi           Stress Protocol Rest HR: 76 Stress HR: 97  Rest BP: 125/67 Stress BP: 113/68  Exercise Time (min): n/a METS: n/a   Predicted Max HR: 153 bpm % Max HR: 63.4 bpm Rate Pressure Product: 21308   Dose of Adenosine (mg):  60.0 Dose of Lexiscan: n/a mg  Dose of Atropine (mg): n/a Dose of Dobutamine: n/a mcg/kg/min (at max HR)  Stress Test Technologist: Smiley Houseman, CMA-N  Nuclear Technologist:  Domenic Polite, CNMT     Rest Procedure:  Myocardial perfusion imaging  was performed at rest 45 minutes following the intravenous administration of Technetium 33m Sestamibi.  Rest ECG: Sinus rhythm, LVH with QRS widening, septal MI  Stress Procedure:  The patient received IV adenosine at 140 mcg/kg/min for 4 minutes.  She c/o chest and neck tightness with infusion.  Technetium 91m Sestamibi was injected at the 2 minute mark and quantitative spect images were obtained after a 45 minute delay.  Stress ECG: No significant ST segment change suggestive of ischemia.  QPS Raw Data Images:  Acquisition technically good; normal left ventricular size. Stress Images:  Normal homogeneous uptake in all areas of the myocardium. Rest Images:  Normal homogeneous uptake in all areas of the myocardium. Subtraction (SDS):  No evidence of ischemia. Transient Ischemic Dilatation (Normal <1.22):  1.05 Lung/Heart Ratio (Normal <0.45):  0.33  Quantitative Gated Spect Images QGS EDV:  106 ml QGS ESV:  34 ml  Impression Exercise Capacity:  Adenosine study with no exercise. BP Response:  Normal blood pressure response. Clinical Symptoms:  There is chest tightness and dyspnea. ECG Impression:  No significant ST segment change suggestive of ischemia. Comparison with Prior Nuclear Study: No images to compare  Overall Impression:  Normal stress nuclear study.  LV Ejection Fraction: 68%.  LV Wall Motion:  NL LV Function; NL Wall Motion  Olga Millers

## 2012-12-16 NOTE — Progress Notes (Addendum)
ECHO 07/03/11 EPIC  12/12/12 STRESS in EPIC  Office visit 12/10/12- Tereso Newcomer PA for Cardiology - EPIc  05/01/12 Office visit with Dr Marca Ancona - EPIC  08/23/11- Ablation - EPIC  Dr Ladona Ridgel Last office visit- 09/24/11  EPICP  Drug eluting stents x3 - 2-1-  AVNRT- 2012  Last cath - 2012

## 2012-12-17 ENCOUNTER — Encounter: Payer: Self-pay | Admitting: Physician Assistant

## 2012-12-17 ENCOUNTER — Telehealth: Payer: Self-pay | Admitting: *Deleted

## 2012-12-17 ENCOUNTER — Encounter (HOSPITAL_COMMUNITY): Payer: Medicare Other

## 2012-12-17 NOTE — Progress Notes (Signed)
Stress Test result in EPIC done 12/16/12

## 2012-12-17 NOTE — Telephone Encounter (Signed)
Message copied by Tarri Fuller on Wed Dec 17, 2012  4:19 PM ------      Message from: [place], [place] T      Created: Wed Dec 17, 2012  1:12 PM       Please inform patient stress test normal.      Patient may proceed with surgery at acceptable risk.      No further CV testing needed.      Patient should continue ASA throughout perioperative period.      Please send copy of my OV note and this stress test report to her surgeon.        Tereso Newcomer, PA-C        12/17/2012 1:07 PM ------

## 2012-12-17 NOTE — Telephone Encounter (Signed)
pt notified about myoview results and cleared for surgery and to stay on ASA through perioperative period. will fax Scott's ov note and myoview results to  Dr. Berton Lan

## 2012-12-21 NOTE — H&P (Signed)
TOTAL KNEE ADMISSION H&P  Patient is being admitted for left total knee arthroplasty.  Subjective:  Chief Complaint:left knee pain.  HPI: Sara [name], 67 y.o. female, has a history of pain and functional disability in the left knee due to arthritis and has failed non-surgical conservative treatments for greater than 12 weeks to includeNSAID's and/or analgesics, corticosteriod injections and activity modification.  Onset of symptoms was gradual, starting 3 years ago with gradually worsening course since that time. The patient noted no past surgery on the left knee(s).  Patient currently rates pain in the left knee(s) at 6 out of 10 with activity. Patient has night pain, worsening of pain with activity and weight bearing, pain that interferes with activities of daily living, pain with passive range of motion, crepitus and joint swelling.  Patient has evidence of periarticular osteophytes and joint space narrowing by imaging studies. There is no active infection.  Patient Active Problem List   Diagnosis Date Noted  . AVNRT (AV nodal re-entry tachycardia)   . Atrial arrhythmia 07/03/2011  . OBSTRUCTIVE SLEEP APNEA 03/15/2010  . DYSPNEA 02/21/2010  . HYPERTENSION, BENIGN 02/28/2009  . EDEMA 02/28/2009  . HYPERLIPIDEMIA 12/09/2008  . HYPERTENSION 12/09/2008  . CAD 12/09/2008  . HEMORRHOIDS 12/09/2008  . BRONCHITIS 12/09/2008  . GERD 12/09/2008  . PSORIASIS 12/09/2008  . ARTHRITIS 12/09/2008  . SYNCOPE 12/09/2008  . VERTIGO 12/09/2008  . CARDIAC MURMUR 12/09/2008   Past Medical History  Diagnosis Date  . HTN (hypertension)   . Hyperlipidemia   . GERD (gastroesophageal reflux disease)   . CAD (coronary artery disease)     a. s/p Xience DES x 3 to LAD;  b. nuc study 02/27/10: EF 67% no ischemia;  c.  echo 11/12: Mild LVH, EF 55-65%, grade 1 diastolic dysfunction, moderate LAE;  d.  LHC 07/03/11: LAD stents patent, RCA 25%, EF 55-65% ;  e. [place]. Myoview 5/14:  No ischemia, EF 68%  .  Obesity   . Other psoriasis   . Arthritis   . Hemorrhoid   . Jaundice   . Diverticular disease   . Vertigo   . Shortness of breath   . 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 Bronchitis   . Myocardial infarction     hx of x 2   . OSA (obstructive sleep apnea)     needs no cpap per patient   . Hepatitis     yellow jaundice as a child     Past Surgical History  Procedure Laterality Date  . Tubal ligation    . Knee surgery      right  . Back surgery      Dr. Magdalene Patricia  . Cardiac catheterization  08/23/2011    Ablation AV  Node  . Abdominal hysterectomy    . Colonoscopy    . Upper gastrointestinal endoscopy    . Coronary angioplasty      with stents 2009      Current outpatient prescriptions: aspirin 81 MG tablet, Take 81 mg by mouth daily. , Disp: , Rfl: ;  Calcium Carbonate-Vitamin D (CALCIUM 600 + D PO), Take 1 tablet by mouth 2 (two) times daily., Disp: , Rfl: ;  fish oil-omega-3 fatty acids 1000 MG capsule, Take 1 g by mouth 2 (two) times daily., Disp: , Rfl: ;  HYDROcodone-acetaminophen (VICODIN) 5-500 MG per tablet, Take 1 tablet by mouth every 6 (six) hours as needed for pain. , Disp: , Rfl:  lisinopril (PRINIVIL,ZESTRIL) 20 MG tablet, Take 20 mg by mouth every morning. , Disp: , Rfl: ;  Magnesium 250 MG TABS, Take 250 mg by mouth daily., Disp: , Rfl: ;  nitroGLYCERIN (NITROSTAT) 0.4 MG SL tablet, Place 0.4 mg under the tongue every 5 (five) minutes as needed. For chest pain, Disp: , Rfl: ;  omeprazole (PRILOSEC) 20 MG capsule, Take 20 mg by mouth daily. , Disp: , Rfl:  potassium chloride (K-DUR,KLOR-CON) 10 MEQ tablet, Take 10 mEq by mouth daily., Disp: , Rfl: ;  rosuvastatin (CRESTOR) 40 MG tablet, Take 40 mg by mouth at bedtime. pateint takes 40 mg alternating with 20 mg, Disp: , Rfl: ;  sodium chloride (OCEAN) 0.65 % nasal spray, Place 1 spray into the nose at bedtime as needed for congestion., Disp: , Rfl: ;  spironolactone (ALDACTONE) 25 MG tablet, Take  12.5 mg by mouth 2 (two) times daily. , Disp: , Rfl:   Allergies  Allergen Reactions  . Tape     bleeding  . Codeine Other (See Comments)    unknown  . Nifedipine Other (See Comments)    Brings blood pressure up really fast    History  Substance Use Topics  . Smoking status: Never Smoker   . Smokeless tobacco: Never Used  . Alcohol Use: No    Family History  Problem Relation Age of Onset  . Aneurysm Father   . Stroke Mother   . Hypertension Mother   . Heart failure Mother   . Seizures Brother   . Muscular dystrophy Daughter     dx as infant, passed age 105  . Colon cancer Neg Hx   . Seizures Son      Review of Systems  Constitutional: Positive for malaise/fatigue. Negative for fever, chills, weight loss and diaphoresis.  HENT: Negative.  Negative for neck pain.   Eyes: Negative.   Respiratory: Positive for shortness of breath. Negative for cough, hemoptysis, sputum production and wheezing.        SOB with exertion  Cardiovascular: Negative.   Gastrointestinal: Positive for abdominal pain. Negative for heartburn, nausea, vomiting, diarrhea, constipation, blood in stool and melena.  Genitourinary: Positive for frequency. Negative for dysuria, urgency, hematuria and flank pain.  Musculoskeletal: Positive for back pain and joint pain. Negative for myalgias and falls.       Left knee pain  Skin: Positive for itching. Negative for rash.       Due to psoriasis  Neurological: Positive for dizziness. Negative for tingling, tremors, sensory change, speech change, focal weakness, seizures, loss of consciousness and weakness.  Endo/Heme/Allergies: Negative.   Psychiatric/Behavioral: Negative.     Objective:  Physical Exam  Constitutional: She is oriented to person, place, and time. She appears well-developed and well-nourished. No distress.  HENT:  Head: Normocephalic and atraumatic.  Right Ear: External ear normal.  Left Ear: External ear normal.  Nose: Nose normal.   Mouth/Throat: Oropharynx is clear and moist.  Eyes: Conjunctivae and EOM are normal.  Neck: Normal range of motion. Neck supple. No tracheal deviation present. No thyromegaly present.  Cardiovascular: Normal rate, regular rhythm, normal heart sounds and intact distal pulses.   No murmur heard. Respiratory: Effort normal and breath sounds normal. No respiratory distress. She has no wheezes. She exhibits no tenderness.  GI: Soft. Bowel sounds are normal. She exhibits no distension and no mass. There is no tenderness.  Musculoskeletal:       Right hip: Normal.       Left  hip: Normal.       Right knee: Normal.       Left knee: She exhibits decreased range of motion and swelling. She exhibits no effusion and no erythema. Tenderness found. Medial joint line and lateral joint line tenderness noted.       Left lower leg: She exhibits no tenderness and no swelling.  Her left knee shows no effusion. Her range is about 5-120. There is moderate crepitus on ROM. She is tender medial greater than lateral with no instability noted.  Lymphadenopathy:    She has no cervical adenopathy.  Neurological: She is alert and oriented to person, place, and time. She has normal strength and normal reflexes. No sensory deficit.  Skin: No rash noted. She is not diaphoretic. No erythema.  Psychiatric: She has a normal mood and affect. Her behavior is normal.     Vitals Pulse: 66 (Regular) BP:138/74 (Sitting, Left Arm, Standard)  Estimated body mass index is 40.60 kg/(m^2) as calculated from the following:   Height as of 05/01/12: 5\' 5"  (1.651 m).   Weight as of 05/01/12: 110.678 kg (244 lb).   Imaging Review Plain radiographs demonstrate severe degenerative joint disease of the left knee(s). The overall alignment ismild varus. The bone quality appears to be good for age and reported activity level.  Assessment/Plan:  End stage arthritis, left knee   The patient history, physical examination,  clinical judgment of the provider and imaging studies are consistent with end stage degenerative joint disease of the left knee(s) and total knee arthroplasty is deemed medically necessary. The treatment options including medical management, injection therapy arthroscopy and arthroplasty were discussed at length. The risks and benefits of total knee arthroplasty were presented and reviewed. The risks due to aseptic loosening, infection, stiffness, patella tracking problems, thromboembolic complications and other imponderables were discussed. The patient acknowledged the explanation, agreed to proceed with the plan and consent was signed. Patient is being admitted for inpatient treatment for surgery, pain control, PT, OT, prophylactic antibiotics, VTE prophylaxis, progressive ambulation and ADL's and discharge planning. The patient is planning to be discharged home with home health services    [place], [place]

## 2012-12-22 ENCOUNTER — Encounter (HOSPITAL_COMMUNITY): Payer: Self-pay | Admitting: Anesthesiology

## 2012-12-22 ENCOUNTER — Encounter (HOSPITAL_COMMUNITY): Payer: Self-pay

## 2012-12-22 ENCOUNTER — Ambulatory Visit (HOSPITAL_COMMUNITY): Payer: Medicare Other | Admitting: Anesthesiology

## 2012-12-22 ENCOUNTER — Encounter (HOSPITAL_COMMUNITY)
Admission: RE | Disposition: A | Discharge status: Home Health | Payer: Self-pay | Source: Ambulatory Visit | Attending: Orthopedic Surgery

## 2012-12-22 ENCOUNTER — Inpatient Hospital Stay (HOSPITAL_COMMUNITY)
Admission: RE | Admit: 2012-12-22 | Discharge: 2012-12-24 | DRG: 470 | Disposition: A | Discharge status: Home Health | Payer: Medicare Other | Source: Ambulatory Visit | Attending: Orthopedic Surgery | Admitting: Orthopedic Surgery

## 2012-12-22 DIAGNOSIS — I10 Essential (primary) hypertension: Secondary | ICD-10-CM | POA: Diagnosis present

## 2012-12-22 DIAGNOSIS — Z96652 Presence of left artificial knee joint: Secondary | ICD-10-CM

## 2012-12-22 DIAGNOSIS — I2510 Atherosclerotic heart disease of native coronary artery without angina pectoris: Secondary | ICD-10-CM | POA: Diagnosis present

## 2012-12-22 DIAGNOSIS — M179 Osteoarthritis of knee, unspecified: Secondary | ICD-10-CM

## 2012-12-22 DIAGNOSIS — Z6841 Body Mass Index (BMI) 40.0 and over, adult: Secondary | ICD-10-CM

## 2012-12-22 DIAGNOSIS — G4733 Obstructive sleep apnea (adult) (pediatric): Secondary | ICD-10-CM | POA: Diagnosis present

## 2012-12-22 DIAGNOSIS — D62 Acute posthemorrhagic anemia: Secondary | ICD-10-CM

## 2012-12-22 DIAGNOSIS — Z79899 Other long term (current) drug therapy: Secondary | ICD-10-CM

## 2012-12-22 DIAGNOSIS — K219 Gastro-esophageal reflux disease without esophagitis: Secondary | ICD-10-CM | POA: Diagnosis present

## 2012-12-22 DIAGNOSIS — E785 Hyperlipidemia, unspecified: Secondary | ICD-10-CM | POA: Diagnosis present

## 2012-12-22 DIAGNOSIS — M1710 Unilateral primary osteoarthritis, unspecified knee: Principal | ICD-10-CM | POA: Diagnosis present

## 2012-12-22 DIAGNOSIS — I252 Old myocardial infarction: Secondary | ICD-10-CM

## 2012-12-22 HISTORY — PX: TOTAL KNEE ARTHROPLASTY: SHX125

## 2012-12-22 LAB — TYPE AND SCREEN
ABO/RH(D): O NEG
Antibody Screen: NEGATIVE

## 2012-12-22 SURGERY — ARTHROPLASTY, KNEE, TOTAL
Anesthesia: Spinal | Site: Knee | Laterality: Left | Wound class: Clean

## 2012-12-22 MED ORDER — MIDAZOLAM HCL 5 MG/5ML IJ SOLN
INTRAMUSCULAR | Status: DC | PRN
  Administered 2012-12-22: 2 mg via INTRAVENOUS

## 2012-12-22 MED ORDER — CHLORHEXIDINE GLUCONATE 4 % EX LIQD
60.0000 mL | Freq: Once | CUTANEOUS | Status: DC
  Filled 2012-12-22: qty 60

## 2012-12-22 MED ORDER — SODIUM CHLORIDE 0.9 % IV SOLN
INTRAVENOUS | Status: DC
  Administered 2012-12-22: 18:00:00 via INTRAVENOUS

## 2012-12-22 MED ORDER — ONDANSETRON HCL 4 MG/2ML IJ SOLN: 4.0000 mg | Freq: Four times a day (QID) | INTRAMUSCULAR | Status: DC | PRN

## 2012-12-22 MED ORDER — ACETAMINOPHEN 10 MG/ML IV SOLN: 1000.0000 mg | Freq: Once | INTRAVENOUS | Status: DC

## 2012-12-22 MED ORDER — BUPIVACAINE HCL 0.25 % IJ SOLN
INTRAMUSCULAR | Status: DC | PRN
  Administered 2012-12-22: 20 mL

## 2012-12-22 MED ORDER — PROPOFOL 10 MG/ML IV BOLUS
INTRAVENOUS | Status: DC | PRN
  Administered 2012-12-22: 200 mg via INTRAVENOUS

## 2012-12-22 MED ORDER — METHOCARBAMOL 500 MG PO TABS
500.0000 mg | ORAL_TABLET | Freq: Four times a day (QID) | ORAL | Status: DC | PRN
  Administered 2012-12-22 – 2012-12-24 (×3): 500 mg via ORAL
  Filled 2012-12-22 (×3): qty 1

## 2012-12-22 MED ORDER — POTASSIUM CHLORIDE CRYS ER 10 MEQ PO TBCR
10.0000 meq | EXTENDED_RELEASE_TABLET | Freq: Every day | ORAL | Status: DC
  Administered 2012-12-22 – 2012-12-24 (×3): 10 meq via ORAL
  Filled 2012-12-22 (×3): qty 1

## 2012-12-22 MED ORDER — SODIUM CHLORIDE 0.9 % IR SOLN
Status: DC | PRN
  Administered 2012-12-22: 1000 mL

## 2012-12-22 MED ORDER — DIPHENHYDRAMINE HCL 50 MG/ML IJ SOLN
25.0000 mg | Freq: Once | INTRAMUSCULAR | Status: AC
  Administered 2012-12-22: 25 mg via INTRAVENOUS

## 2012-12-22 MED ORDER — CEFAZOLIN SODIUM-DEXTROSE 2-3 GM-% IV SOLR
2.0000 g | INTRAVENOUS | Status: AC
  Administered 2012-12-22: 2 g via INTRAVENOUS

## 2012-12-22 MED ORDER — CEFAZOLIN SODIUM-DEXTROSE 2-3 GM-% IV SOLR
INTRAVENOUS | Status: AC
  Filled 2012-12-22: qty 50

## 2012-12-22 MED ORDER — BUPIVACAINE HCL (PF) 0.25 % IJ SOLN
INTRAMUSCULAR | Status: AC
  Filled 2012-12-22: qty 30

## 2012-12-22 MED ORDER — KETOROLAC TROMETHAMINE 30 MG/ML IJ SOLN
INTRAMUSCULAR | Status: DC | PRN
  Administered 2012-12-22: 30 mg via INTRAVENOUS

## 2012-12-22 MED ORDER — KETOROLAC TROMETHAMINE 15 MG/ML IJ SOLN: 15.0000 mg | Freq: Four times a day (QID) | INTRAMUSCULAR | Status: DC | PRN

## 2012-12-22 MED ORDER — FLEET ENEMA 7-19 GM/118ML RE ENEM: 1.0000 | ENEMA | Freq: Once | RECTAL | Status: AC | PRN

## 2012-12-22 MED ORDER — ATORVASTATIN CALCIUM 40 MG PO TABS
40.0000 mg | ORAL_TABLET | ORAL | Status: DC
  Filled 2012-12-22: qty 1

## 2012-12-22 MED ORDER — CEFAZOLIN SODIUM-DEXTROSE 2-3 GM-% IV SOLR
2.0000 g | Freq: Four times a day (QID) | INTRAVENOUS | Status: AC
  Administered 2012-12-22 – 2012-12-23 (×2): 2 g via INTRAVENOUS
  Filled 2012-12-22 (×2): qty 50

## 2012-12-22 MED ORDER — HYDROMORPHONE HCL PF 1 MG/ML IJ SOLN
0.2500 mg | INTRAMUSCULAR | Status: DC | PRN
  Administered 2012-12-22 (×2): 0.5 mg via INTRAVENOUS

## 2012-12-22 MED ORDER — BISACODYL 10 MG RE SUPP: 10.0000 mg | Freq: Every day | RECTAL | Status: DC | PRN

## 2012-12-22 MED ORDER — 0.9 % SODIUM CHLORIDE (POUR BTL) OPTIME
TOPICAL | Status: DC | PRN
  Administered 2012-12-22: 1000 mL

## 2012-12-22 MED ORDER — LACTATED RINGERS IV SOLN: INTRAVENOUS | Status: DC

## 2012-12-22 MED ORDER — DIPHENHYDRAMINE HCL 12.5 MG/5ML PO ELIX
12.5000 mg | ORAL_SOLUTION | ORAL | Status: DC | PRN
  Administered 2012-12-22 – 2012-12-23 (×3): 12.5 mg via ORAL
  Administered 2012-12-24: 25 mg via ORAL
  Filled 2012-12-22 (×4): qty 5
  Filled 2012-12-22: qty 10

## 2012-12-22 MED ORDER — MORPHINE SULFATE 2 MG/ML IJ SOLN
1.0000 mg | INTRAMUSCULAR | Status: DC | PRN
  Administered 2012-12-22 – 2012-12-23 (×2): 1 mg via INTRAVENOUS
  Filled 2012-12-22 (×2): qty 1

## 2012-12-22 MED ORDER — SODIUM CHLORIDE 0.9 % IV SOLN: INTRAVENOUS | Status: DC

## 2012-12-22 MED ORDER — DEXAMETHASONE SODIUM PHOSPHATE 10 MG/ML IJ SOLN
INTRAMUSCULAR | Status: DC | PRN
  Administered 2012-12-22: 10 mg via INTRAVENOUS

## 2012-12-22 MED ORDER — OXYCODONE HCL 5 MG PO TABS
5.0000 mg | ORAL_TABLET | ORAL | Status: DC | PRN
  Administered 2012-12-22: 5 mg via ORAL
  Administered 2012-12-23 – 2012-12-24 (×9): 10 mg via ORAL
  Filled 2012-12-22 (×6): qty 2
  Filled 2012-12-22: qty 1
  Filled 2012-12-22 (×3): qty 2

## 2012-12-22 MED ORDER — SUCCINYLCHOLINE CHLORIDE 20 MG/ML IJ SOLN
INTRAMUSCULAR | Status: DC | PRN
  Administered 2012-12-22: 100 mg via INTRAVENOUS

## 2012-12-22 MED ORDER — METOCLOPRAMIDE HCL 10 MG PO TABS: 5.0000 mg | ORAL_TABLET | Freq: Three times a day (TID) | ORAL | Status: DC | PRN

## 2012-12-22 MED ORDER — PHENOL 1.4 % MT LIQD: 1.0000 | OROMUCOSAL | Status: DC | PRN

## 2012-12-22 MED ORDER — LIDOCAINE HCL (CARDIAC) 20 MG/ML IV SOLN
INTRAVENOUS | Status: DC | PRN
  Administered 2012-12-22: 60 mg via INTRAVENOUS

## 2012-12-22 MED ORDER — NITROGLYCERIN 0.4 MG SL SUBL: 0.4000 mg | SUBLINGUAL_TABLET | SUBLINGUAL | Status: DC | PRN

## 2012-12-22 MED ORDER — HYDROMORPHONE HCL PF 1 MG/ML IJ SOLN
INTRAMUSCULAR | Status: DC | PRN
  Administered 2012-12-22: 2 mg via INTRAVENOUS

## 2012-12-22 MED ORDER — METHOCARBAMOL 100 MG/ML IJ SOLN: 500.0000 mg | Freq: Four times a day (QID) | INTRAVENOUS | Status: DC | PRN

## 2012-12-22 MED ORDER — PANTOPRAZOLE SODIUM 40 MG PO TBEC
40.0000 mg | DELAYED_RELEASE_TABLET | Freq: Every day | ORAL | Status: DC
  Administered 2012-12-22: 40 mg via ORAL
  Filled 2012-12-22 (×2): qty 1

## 2012-12-22 MED ORDER — ACETAMINOPHEN 650 MG RE SUPP: 650.0000 mg | Freq: Four times a day (QID) | RECTAL | Status: DC | PRN

## 2012-12-22 MED ORDER — TRAMADOL HCL 50 MG PO TABS: 50.0000 mg | ORAL_TABLET | Freq: Four times a day (QID) | ORAL | Status: DC | PRN

## 2012-12-22 MED ORDER — DEXAMETHASONE SODIUM PHOSPHATE 10 MG/ML IJ SOLN: 10.0000 mg | Freq: Every day | INTRAMUSCULAR | Status: AC

## 2012-12-22 MED ORDER — SODIUM CHLORIDE 0.9 % IJ SOLN
INTRAMUSCULAR | Status: DC | PRN
  Administered 2012-12-22: 14:00:00

## 2012-12-22 MED ORDER — LACTATED RINGERS IV SOLN
INTRAVENOUS | Status: DC
  Administered 2012-12-22: 1000 mL via INTRAVENOUS
  Administered 2012-12-22: 14:00:00 via INTRAVENOUS

## 2012-12-22 MED ORDER — MENTHOL 3 MG MT LOZG: 1.0000 | LOZENGE | OROMUCOSAL | Status: DC | PRN

## 2012-12-22 MED ORDER — SPIRONOLACTONE 12.5 MG HALF TABLET
12.5000 mg | ORAL_TABLET | Freq: Two times a day (BID) | ORAL | Status: DC
  Administered 2012-12-22 – 2012-12-24 (×4): 12.5 mg via ORAL
  Filled 2012-12-22 (×6): qty 1

## 2012-12-22 MED ORDER — POLYETHYLENE GLYCOL 3350 17 G PO PACK: 17.0000 g | PACK | Freq: Every day | ORAL | Status: DC | PRN

## 2012-12-22 MED ORDER — DOCUSATE SODIUM 100 MG PO CAPS
100.0000 mg | ORAL_CAPSULE | Freq: Two times a day (BID) | ORAL | Status: DC
  Administered 2012-12-22 – 2012-12-24 (×4): 100 mg via ORAL

## 2012-12-22 MED ORDER — FENTANYL CITRATE 0.05 MG/ML IJ SOLN
INTRAMUSCULAR | Status: DC | PRN
  Administered 2012-12-22: 150 ug via INTRAVENOUS
  Administered 2012-12-22 (×2): 100 ug via INTRAVENOUS

## 2012-12-22 MED ORDER — RIVAROXABAN 10 MG PO TABS
10.0000 mg | ORAL_TABLET | Freq: Every day | ORAL | Status: DC
  Administered 2012-12-23 – 2012-12-24 (×2): 10 mg via ORAL
  Filled 2012-12-22 (×3): qty 1

## 2012-12-22 MED ORDER — DIPHENHYDRAMINE HCL 50 MG/ML IJ SOLN
INTRAMUSCULAR | Status: AC
  Filled 2012-12-22: qty 1

## 2012-12-22 MED ORDER — METOCLOPRAMIDE HCL 5 MG/ML IJ SOLN: 5.0000 mg | Freq: Three times a day (TID) | INTRAMUSCULAR | Status: DC | PRN

## 2012-12-22 MED ORDER — ONDANSETRON HCL 4 MG PO TABS: 4.0000 mg | ORAL_TABLET | Freq: Four times a day (QID) | ORAL | Status: DC | PRN

## 2012-12-22 MED ORDER — BUPIVACAINE LIPOSOME 1.3 % IJ SUSP
20.0000 mL | Freq: Once | INTRAMUSCULAR | Status: DC
  Filled 2012-12-22: qty 20

## 2012-12-22 MED ORDER — ONDANSETRON HCL 4 MG/2ML IJ SOLN
INTRAMUSCULAR | Status: DC | PRN
  Administered 2012-12-22: 4 mg via INTRAVENOUS

## 2012-12-22 MED ORDER — HYDROMORPHONE HCL PF 1 MG/ML IJ SOLN
INTRAMUSCULAR | Status: AC
  Filled 2012-12-22: qty 1

## 2012-12-22 MED ORDER — ATORVASTATIN CALCIUM 80 MG PO TABS
80.0000 mg | ORAL_TABLET | ORAL | Status: DC
  Filled 2012-12-22: qty 1

## 2012-12-22 MED ORDER — ACETAMINOPHEN 325 MG PO TABS: 650.0000 mg | ORAL_TABLET | Freq: Four times a day (QID) | ORAL | Status: DC | PRN

## 2012-12-22 MED ORDER — DEXAMETHASONE 6 MG PO TABS
10.0000 mg | ORAL_TABLET | Freq: Every day | ORAL | Status: AC
  Administered 2012-12-23: 10 mg via ORAL
  Filled 2012-12-22: qty 1

## 2012-12-22 SURGICAL SUPPLY — 55 items
BAG SPEC THK2 15X12 ZIP CLS (MISCELLANEOUS)
BAG ZIPLOCK 12X15 (MISCELLANEOUS) ×1 IMPLANT
BANDAGE ELASTIC 6 VELCRO ST LF (GAUZE/BANDAGES/DRESSINGS) ×2 IMPLANT
BANDAGE ESMARK 6X9 LF (GAUZE/BANDAGES/DRESSINGS) ×1 IMPLANT
BLADE SAG 18X100X1.27 (BLADE) ×2 IMPLANT
BLADE SAW SGTL 11.0X1.19X90.0M (BLADE) ×2 IMPLANT
BNDG CMPR 9X6 STRL LF SNTH (GAUZE/BANDAGES/DRESSINGS) ×1
BNDG ESMARK 6X9 LF (GAUZE/BANDAGES/DRESSINGS) ×2
BOWL SMART MIX CTS (DISPOSABLE) ×2 IMPLANT
CEMENT HV SMART SET (Cement) ×4 IMPLANT
CLOTH BEACON ORANGE TIMEOUT ST (SAFETY) ×2 IMPLANT
CUFF TOURN SGL QUICK 34 (TOURNIQUET CUFF) ×2
CUFF TRNQT CYL 34X4X40X1 (TOURNIQUET CUFF) ×1 IMPLANT
DECANTER SPIKE VIAL GLASS SM (MISCELLANEOUS) ×2 IMPLANT
DRAPE EXTREMITY T 121X128X90 (DRAPE) ×2 IMPLANT
DRAPE POUCH INSTRU U-SHP 10X18 (DRAPES) ×2 IMPLANT
DRAPE U-SHAPE 47X51 STRL (DRAPES) ×2 IMPLANT
DRSG ADAPTIC 3X8 NADH LF (GAUZE/BANDAGES/DRESSINGS) ×2 IMPLANT
DRSG PAD ABDOMINAL 8X10 ST (GAUZE/BANDAGES/DRESSINGS) ×2 IMPLANT
DURAPREP 26ML APPLICATOR (WOUND CARE) ×2 IMPLANT
ELECT REM PT RETURN 9FT ADLT (ELECTROSURGICAL) ×2
ELECTRODE REM PT RTRN 9FT ADLT (ELECTROSURGICAL) ×1 IMPLANT
EVACUATOR 1/8 PVC DRAIN (DRAIN) ×2 IMPLANT
FACESHIELD LNG OPTICON STERILE (SAFETY) ×10 IMPLANT
GLOVE BIO SURGEON STRL SZ7.5 (GLOVE) ×1 IMPLANT
GLOVE BIO SURGEON STRL SZ8 (GLOVE) ×2 IMPLANT
GLOVE BIOGEL PI IND STRL 8 (GLOVE) ×1 IMPLANT
GLOVE BIOGEL PI INDICATOR 8 (GLOVE) ×2
GOWN STRL NON-REIN LRG LVL3 (GOWN DISPOSABLE) ×2 IMPLANT
GOWN STRL REIN XL XLG (GOWN DISPOSABLE) ×2 IMPLANT
HANDPIECE INTERPULSE COAX TIP (DISPOSABLE) ×2
IMMOBILIZER KNEE 20 (SOFTGOODS) ×2
IMMOBILIZER KNEE 20 THIGH 36 (SOFTGOODS) ×1 IMPLANT
KIT BASIN OR (CUSTOM PROCEDURE TRAY) ×2 IMPLANT
MANIFOLD NEPTUNE II (INSTRUMENTS) ×2 IMPLANT
NDL SAFETY ECLIPSE 18X1.5 (NEEDLE) ×2 IMPLANT
NEEDLE HYPO 18GX1.5 SHARP (NEEDLE) ×4
NS IRRIG 1000ML POUR BTL (IV SOLUTION) ×2 IMPLANT
PACK TOTAL JOINT (CUSTOM PROCEDURE TRAY) ×2 IMPLANT
PADDING CAST COTTON 6X4 STRL (CAST SUPPLIES) ×6 IMPLANT
POSITIONER SURGICAL ARM (MISCELLANEOUS) ×2 IMPLANT
SET HNDPC FAN SPRY TIP SCT (DISPOSABLE) ×1 IMPLANT
SPONGE GAUZE 4X4 12PLY (GAUZE/BANDAGES/DRESSINGS) ×2 IMPLANT
STRIP CLOSURE SKIN 1/2X4 (GAUZE/BANDAGES/DRESSINGS) ×4 IMPLANT
SUCTION FRAZIER 12FR DISP (SUCTIONS) ×2 IMPLANT
SUT MNCRL AB 4-0 PS2 18 (SUTURE) ×2 IMPLANT
SUT VIC AB 2-0 CT1 27 (SUTURE) ×6
SUT VIC AB 2-0 CT1 TAPERPNT 27 (SUTURE) ×3 IMPLANT
SUT VLOC 180 0 24IN GS25 (SUTURE) ×2 IMPLANT
SYR 20CC LL (SYRINGE) ×2 IMPLANT
SYR 50ML LL SCALE MARK (SYRINGE) ×2 IMPLANT
TOWEL OR 17X26 10 PK STRL BLUE (TOWEL DISPOSABLE) ×4 IMPLANT
TRAY FOLEY CATH 14FRSI W/METER (CATHETERS) ×2 IMPLANT
WATER STERILE IRR 1500ML POUR (IV SOLUTION) ×3 IMPLANT
WRAP KNEE MAXI GEL POST OP (GAUZE/BANDAGES/DRESSINGS) ×2 IMPLANT

## 2012-12-22 NOTE — Op Note (Signed)
Pre-operative diagnosis- Osteoarthritis  Left knee(s)  Post-operative diagnosis- Osteoarthritis Left knee(s)  Procedure-  Left  Total Knee Arthroplasty  Surgeon- Gus Rankin. Keiandre Cygan, MD  Assistant- Avel Peace, PA-C   Anesthesia-  General EBL-* No blood loss amount entered *  Drains Hemovac  Tourniquet time-  Total Tourniquet Time Documented: Thigh (Left) - 38 minutes Total: Thigh (Left) - 38 minutes    Complications- None  Condition-PACU - hemodynamically stable.   Brief Clinical Note  Sara [name] is a 67 y.o. year old female with end stage OA of her left knee with progressively worsening pain and dysfunction. She has constant pain, with activity and at rest and significant functional deficits with difficulties even with ADLs. She has had extensive non-op management including analgesics, injections of cortisone and viscosupplements, and home exercise program, but remains in significant pain with significant dysfunction. Radiographs show bone on bone arthritis medial and patellofemoral. She presents now for left Total Knee Arthroplasty.    Procedure in detail---   The patient is brought into the operating room and positioned supine on the operating table. After successful administration of  General,   a tourniquet is placed high on the  Left thigh(s) and the lower extremity is prepped and draped in the usual sterile fashion. Time out is performed by the operating team and then the  Left lower extremity is wrapped in Esmarch, knee flexed and the tourniquet inflated to 300 mmHg.       A midline incision is made with a ten blade through the subcutaneous tissue to the level of the extensor mechanism. A fresh blade is used to make a medial parapatellar arthrotomy. Soft tissue over the proximal medial tibia is subperiosteally elevated to the joint line with a knife and into the semimembranosus bursa with a Cobb elevator. Soft tissue over the proximal lateral tibia is elevated with attention  being paid to avoiding the patellar tendon on the tibial tubercle. The patella is everted, knee flexed 90 degrees and the ACL and PCL are removed. Findings are bone on bone medial and patellofemoral with large medial osteophytes.        The drill is used to create a starting hole in the distal femur and the canal is thoroughly irrigated with sterile saline to remove the fatty contents. The 5 degree Left  valgus alignment guide is placed into the femoral canal and the distal femoral cutting block is pinned to remove 10 mm off the distal femur. Resection is made with an oscillating saw.      The tibia is subluxed forward and the menisci are removed. The extramedullary alignment guide is placed referencing proximally at the medial aspect of the tibial tubercle and distally along the second metatarsal axis and tibial crest. The block is pinned to remove 2mm off the more deficient medial  side. Resection is made with an oscillating saw. Size 3is the most appropriate size for the tibia and the proximal tibia is prepared with the modular drill and keel punch for that size.      The femoral sizing guide is placed and size 3 is most appropriate. Rotation is marked off the epicondylar axis and confirmed by creating a rectangular flexion gap at 90 degrees. The size 3 cutting block is pinned in this rotation and the anterior, posterior and chamfer cuts are made with the oscillating saw. The intercondylar block is then placed and that cut is made.      Trial size 3 tibial component, trial size 3  posterior stabilized femur and a 10  mm posterior stabilized rotating platform insert trial is placed. Full extension is achieved with excellent varus/valgus and anterior/posterior balance throughout full range of motion. The patella is everted and thickness measured to be 22  mm. Free hand resection is taken to 12 mm, a 35 template is placed, lug holes are drilled, trial patella is placed, and it tracks normally. Osteophytes are  removed off the posterior femur with the trial in place. All trials are removed and the cut bone surfaces prepared with pulsatile lavage. Cement is mixed and once ready for implantation, the size 3 tibial implant, size  3 posterior stabilized femoral component, and the size 35 patella are cemented in place and the patella is held with the clamp. The trial insert is placed and the knee held in full extension. The Exparel (20 ml mixed with 30 ml saline) and .25% Bupivicaine, are injected into the extensor mechanism, posterior capsule, medial and lateral gutters and subcutaneous tissues.  All extruded cement is removed and once the cement is hard the permanent 10 mm posterior stabilized rotating platform insert is placed into the tibial tray.      The wound is copiously irrigated with saline solution and the extensor mechanism closed over a hemovac drain with #1 PDS suture. The tourniquet is released for a total tourniquet time of 37  minutes. Flexion against gravity is 135 degrees and the patella tracks normally. Subcutaneous tissue is closed with 2.0 vicryl and subcuticular with running 4.0 Monocryl. The incision is cleaned and dried and steri-strips and a bulky sterile dressing are applied. The limb is placed into a knee immobilizer and the patient is awakened and transported to recovery in stable condition.      Please note that a surgical assistant was a medical necessity for this procedure in order to perform it in a safe and expeditious manner. Surgical assistant was necessary to retract the ligaments and vital neurovascular structures to prevent injury to them and also necessary for proper positioning of the limb to allow for anatomic placement of the prosthesis.   Gus Rankin Cylinda Santoli, MD    12/22/2012, 1:50 PM

## 2012-12-22 NOTE — Anesthesia Preprocedure Evaluation (Addendum)
Anesthesia Evaluation  Patient identified by MRN, date of birth, ID band Patient awake    Reviewed: Allergy & Precautions, H&P , NPO status , Patient's Chart, lab work & pertinent test results  Airway Mallampati: III TM Distance: >3 FB Neck ROM: full    Dental no notable dental hx. (+) Teeth Intact and Dental Advisory Given   Pulmonary shortness of breath and with exertion, sleep apnea ,  breath sounds clear to auscultation  Pulmonary exam normal       Cardiovascular hypertension, Pt. on medications + CAD, + Past MI and + Cardiac Stents + dysrhythmias Supra Ventricular Tachycardia Rhythm:regular Rate:Normal  DES x 3 LAD.  Myoview 5/14 no ischemia EF 68%. AV nodal re-entry tachycardia s/p RFCA 3/13   Neuro/Psych vertigo negative neurological ROS  negative psych ROS   GI/Hepatic negative GI ROS, Neg liver ROS, GERD-  Medicated and Controlled,  Endo/Other  negative endocrine ROSMorbid obesity  Renal/GU negative Renal ROS  negative genitourinary   Musculoskeletal   Abdominal   Peds  Hematology negative hematology ROS (+)   Anesthesia Other Findings   Reproductive/Obstetrics negative OB ROS                         Anesthesia Physical Anesthesia Plan  ASA: III  Anesthesia Plan: General   Post-op Pain Management:    Induction:   Airway Management Planned: Oral ETT  Additional Equipment:   Intra-op Plan:   Post-operative Plan: Extubation in OR  Informed Consent: I have reviewed the patients History and Physical, chart, labs and discussed the procedure including the risks, benefits and alternatives for the proposed anesthesia with the patient or authorized representative who has indicated his/her understanding and acceptance.   Dental Advisory Given  Plan Discussed with: CRNA and Surgeon  Anesthesia Plan Comments:        Anesthesia Quick Evaluation

## 2012-12-22 NOTE — Plan of Care (Signed)
Problem: Consults Goal: Diagnosis- Total Joint Replacement Left total knee     

## 2012-12-22 NOTE — Preoperative (Signed)
Beta Blockers   Reason not to administer Beta Blockers:Not Applicable 

## 2012-12-22 NOTE — Anesthesia Postprocedure Evaluation (Signed)
  Anesthesia Post-op Note  Patient: Sara [name]  Procedure(s) Performed: Procedure(s) (LRB): LEFT TOTAL KNEE ARTHROPLASTY (Left)  Patient Location: PACU  Anesthesia Type: General  Level of Consciousness: awake and alert   Airway and Oxygen Therapy: Patient Spontanous Breathing  Post-op Pain: mild  Post-op Assessment: Post-op Vital signs reviewed, Patient's Cardiovascular Status Stable, Respiratory Function Stable, Patent Airway and No signs of Nausea or vomiting  Last Vitals:  Filed Vitals:   12/22/12 1445  BP: 143/77  Pulse: 96  Temp:   Resp: 13    Post-op Vital Signs: stable   Complications: No apparent anesthesia complications

## 2012-12-22 NOTE — Transfer of Care (Signed)
Immediate Anesthesia Transfer of Care Note  Patient: Sara [name]  Procedure(s) Performed: Procedure(s): LEFT TOTAL KNEE ARTHROPLASTY (Left)  Patient Location: PACU  Anesthesia Type:General  Level of Consciousness: awake and alert   Airway & Oxygen Therapy: Patient Spontanous Breathing and Patient connected to face mask oxygen  Post-op Assessment: Report given to PACU RN and Post -op Vital signs reviewed and stable  Post vital signs: Reviewed and stable  Complications: No apparent anesthesia complications

## 2012-12-22 NOTE — Interval H&P Note (Signed)
History and Physical Interval Note:  12/22/2012 9:34 AM  Sara [name]  has presented today for surgery, with the diagnosis of OA LEFT KNEE  The various methods of treatment have been discussed with the patient and family. After consideration of risks, benefits and other options for treatment, the patient has consented to  Procedure(s): LEFT TOTAL KNEE ARTHROPLASTY (Left) as a surgical intervention .  The patient's history has been reviewed, patient examined, no change in status, stable for surgery.  I have reviewed the patient's chart and labs.  Questions were answered to the patient's satisfaction.     Loanne Drilling

## 2012-12-23 ENCOUNTER — Encounter (HOSPITAL_COMMUNITY): Payer: Self-pay | Admitting: Orthopedic Surgery

## 2012-12-23 DIAGNOSIS — D62 Acute posthemorrhagic anemia: Secondary | ICD-10-CM

## 2012-12-23 LAB — BASIC METABOLIC PANEL
BUN: 17 mg/dL (ref 6–23)
Chloride: 104 mEq/L (ref 96–112)
Creatinine, Ser: 0.69 mg/dL (ref 0.50–1.10)
GFR calc Af Amer: >90 mL/min (ref >90)
GFR calc non Af Amer: 88 mL/min — ABNORMAL LOW (ref >90)
Glucose, Bld: 199 mg/dL — ABNORMAL HIGH (ref 70–99)

## 2012-12-23 LAB — CBC
HCT: 31.2 % — ABNORMAL LOW (ref 36.0–46.0)
Hemoglobin: 11 g/dL — ABNORMAL LOW (ref 12.0–15.0)
MCHC: 35.3 g/dL (ref 30.0–36.0)
MCV: 89.1 fL (ref 78.0–100.0)
RDW: 13.1 % (ref 11.5–15.5)

## 2012-12-23 MED ORDER — NON FORMULARY: 40.0000 mg | Freq: Every day | Status: DC

## 2012-12-23 MED ORDER — OMEPRAZOLE 20 MG PO CPDR
20.0000 mg | DELAYED_RELEASE_CAPSULE | Freq: Every day | ORAL | Status: DC
  Administered 2012-12-23 – 2012-12-24 (×2): 20 mg via ORAL
  Filled 2012-12-23 (×2): qty 1

## 2012-12-23 MED ORDER — ROSUVASTATIN CALCIUM 40 MG PO TABS
40.0000 mg | ORAL_TABLET | Freq: Every day | ORAL | Status: DC
  Administered 2012-12-23: 40 mg via ORAL
  Filled 2012-12-23 (×2): qty 1

## 2012-12-23 MED ORDER — NON FORMULARY: 20.0000 mg | Freq: Every day | Status: DC

## 2012-12-23 NOTE — Progress Notes (Signed)
Utilization review completed.  

## 2012-12-23 NOTE — Care Management Note (Addendum)
    Page 1 of 1   12/24/2012     5:04:03 PM   CARE MANAGEMENT NOTE 12/24/2012  Patient:  Sara [name], Sara [name]   Account Number:  [id number]  Date Initiated:  12/23/2012  Documentation initiated by:  Colleen Can  Subjective/Objective Assessment:   dx osteoarthritis left knee, total knee replacemnt     Action/Plan:   CM spoke with patient. Plans are for her to return to her home in [place] [facility] [place] [facility] [place] where spouse will be caregiver. She alrready has RW and raised toilet seat. [facility] is [facility]. CM will f/u with [facility]   Anticipated DC Date:  12/25/2012   Anticipated DC Plan:  HOME W HOME HEALTH SERVICES      DC Planning Services  CM consult      [facility] Choice  HOME HEALTH   Choice offered to / List presented to:  C-1 Patient        HH arranged  HH-2 PT      [place] agency  [facility]   Status of service:  Completed, signed off Medicare Important Message given?  NA - LOS <3 / Initial given by admissions (If response is "NO", the following Medicare IM given date fields will be blank) Date Medicare IM given:   Date Additional Medicare IM given:    Discharge Disposition:  HOME W HOME HEALTH SERVICES  Per UR Regulation:    If discussed at Long Length of Stay Meetings, dates discussed:    Comments:  12/24/2012 Colleen Can BSN RN CCM [phone] [facility] notified of request for services and can provide HHpt with start date of 12/25/2012. Pt for discharge today.

## 2012-12-23 NOTE — Evaluation (Signed)
Physical Therapy Evaluation Patient Details Name: Sara [name] MRN: [id number] DOB: July 11, 1946 Today's Date: 12/23/2012 Time: 4332-9518 PT Time Calculation (min): 32 min  PT Assessment / Plan / Recommendation Clinical Impression  s/p L TKA and will benefit from PT  in the acute setting to improve overall I    PT Assessment  Patient needs continued PT services    Follow Up Recommendations  Home health PT    Does the patient have the potential to tolerate intense rehabilitation      Barriers to Discharge        Equipment Recommendations  Other (comment) (pt to borrow RW)    Recommendations for Other Services     Frequency 7X/week    Precautions / Restrictions Precautions Precautions: Knee Required Braces or Orthoses: Knee Immobilizer - Left Knee Immobilizer - Left: Discontinue once straight leg raise with < 10 degree lag Restrictions LLE Weight Bearing: Weight bearing as tolerated   Pertinent Vitals/Pain Pain controlled per pt      Mobility  Bed Mobility Bed Mobility: Sit to Supine Sit to Supine: 4: Min assist Details for Bed Mobility Assistance: cues fro technique Transfers Transfers: Sit to Stand;Stand to Sit Sit to Stand: 4: Min assist;4: Min guard;From chair/3-in-1 Stand to Sit: 4: Min guard;4: Min assist;To bed;To chair/3-in-1 Details for Transfer Assistance: cues for hands and LE management Ambulation/Gait Ambulation/Gait Assistance: 4: Min guard Ambulation Distance (Feet): 50 Feet (15') Assistive device: Rolling walker Ambulation/Gait Assistance Details: cues for Rw distance from self, sequence Gait Pattern: Step-to pattern    Exercises Total Joint Exercises Ankle Circles/Pumps: AROM;Both;10 reps Quad Sets: AROM;Both;10 reps Short Arc Quad: AROM;AAROM;Left;10 reps Heel Slides: AAROM;AROM;Left;10 reps Hip ABduction/ADduction: AROM;Left;10 reps Straight Leg Raises: AAROM;Left;10 reps Goniometric ROM: approx 8-62*   PT Diagnosis: Difficulty walking   PT Problem List: Decreased strength;Decreased range of motion;Decreased activity tolerance;Decreased mobility PT Treatment Interventions: Functional mobility training;Gait training;Stair training;DME instruction;Therapeutic activities;Therapeutic exercise;Patient/family education   PT Goals Acute Rehab PT Goals PT Goal Formulation: With patient Time For Goal Achievement: 12/30/12 Potential to Achieve Goals: Good Pt will go Supine/Side to Sit: with supervision PT Goal: Supine/Side to Sit - Progress: Goal set today Pt will go Sit to Stand: with supervision PT Goal: Sit to Stand - Progress: Goal set today Pt will Ambulate: 51 - 150 feet;with supervision;with rolling walker PT Goal: Ambulate - Progress: Goal set today Pt will Go Up / Down Stairs: 6-9 stairs;with min assist;with least restrictive assistive device;with rail(s) PT Goal: Up/Down Stairs - Progress: Goal set today Pt will Perform Home Exercise Program: with supervision, verbal cues required/provided PT Goal: Perform Home Exercise Program - Progress: Goal set today  Visit Information  Last PT Received On: 12/23/12 Assistance Needed: +1    Subjective Data  Subjective: I work out at the [employer] Patient Stated Goal: home   Prior Functioning  Home Living Lives With: Spouse Available Help at Discharge: Available PRN/intermittently Type of Home: House Home Access: Stairs to enter Secretary/administrator of Steps: 1 small Home Layout: One level (bedroom downstairs; one rail) Home Adaptive Equipment: Bedside commode/3-in-1;Crutches Additional Comments: pt plans to borrow walker    Cognition  Cognition Arousal/Alertness: Awake/alert Behavior During Therapy: WFL for tasks assessed/performed Overall Cognitive Status: Within Functional Limits for tasks assessed    Extremity/Trunk Assessment Right Lower Extremity Assessment RLE ROM/Strength/Tone: [facility] for tasks assessed Left Lower Extremity Assessment LLE ROM/Strength/Tone:  Deficits LLE ROM/Strength/Tone Deficits: able to assist with SLR;  ankle [facility]   Balance    End  of Session PT - End of Session Equipment Utilized During Treatment: Left knee immobilizer Activity Tolerance: Patient tolerated treatment well Patient left: in bed;with call bell/phone within reach  GP     [facility] 12/23/2012, 2:04 PM

## 2012-12-23 NOTE — Progress Notes (Signed)
   Subjective: 1 Day Post-Op Procedure(s) (LRB): LEFT TOTAL KNEE ARTHROPLASTY (Left) Patient reports pain as mild.   Patient seen in rounds with Dr. Lequita Halt.  Patient states that she is doing great. Patient is well, and has had no acute complaints or problems We will start therapy today.  Plan is to go Home after hospital stay.  Objective: Vital signs in last 24 hours: Temp:  [97.3 F (36.3 C)-98.9 F (37.2 C)] 97.5 F (36.4 C) (06/03 0437) Pulse Rate:  [69-104] 69 (06/03 0437) Resp:  [12-20] 14 (06/03 0437) BP: (114-157)/(59-91) 136/77 mmHg (06/03 0437) SpO2:  [92 %-100 %] 96 % (06/03 0437) Weight:  [112.038 kg (247 lb)] 112.038 kg (247 lb) (06/02 1630)  Intake/Output from previous day:  Intake/Output Summary (Last 24 hours) at 12/23/12 0842 Last data filed at 12/23/12 0600  Gross per 24 hour  Intake 2772.5 ml  Output   2720 ml  Net   52.5 ml    Intake/Output this shift: UOP 1400 since MN +52  Labs:  Recent Labs  12/23/12 0432  HGB 11.0*    Recent Labs  12/23/12 0432  WBC 8.0  RBC 3.50*  HCT 31.2*  PLT 121*    Recent Labs  12/23/12 0432  NA 135  K 4.4  CL 104  CO2 24  BUN 17  CREATININE 0.69  GLUCOSE 199*  CALCIUM 8.6   No results found for this basename: LABPT, INR,  in the last 72 hours  EXAM General - Patient is Alert, Appropriate and Oriented Extremity - Neurovascular intact Sensation intact distally Dorsiflexion/Plantar flexion intact No cellulitis present Dressing - dressing C/D/I Motor Function - intact, moving foot and toes well on exam.  Hemovac pulled without difficulty.  Past Medical History  Diagnosis Date  . HTN (hypertension)   . Hyperlipidemia   . GERD (gastroesophageal reflux disease)   . CAD (coronary artery disease)     a. s/p Xience DES x 3 to LAD;  b. nuc study 02/27/10: EF 67% no ischemia;  c.  echo 11/12: Mild LVH, EF 55-65%, grade 1 diastolic dysfunction, moderate LAE;  d.  LHC 07/03/11: LAD stents patent, RCA  25%, EF 55-65% ;  e. [place]. Myoview 5/14:  No ischemia, EF 68%  . Obesity   . Other psoriasis   . Arthritis   . Hemorrhoid   . Jaundice   . Diverticular disease   . Vertigo   . Shortness of breath   . 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 Bronchitis   . Myocardial infarction     hx of x 2   . OSA (obstructive sleep apnea)     needs no cpap per patient   . Hepatitis     yellow jaundice as a child     Assessment/Plan: 1 Day Post-Op Procedure(s) (LRB): LEFT TOTAL KNEE ARTHROPLASTY (Left) Principal Problem:   OA (osteoarthritis) of knee Active Problems:   Postoperative anemia due to acute blood loss  Estimated body mass index is 41.1 kg/(m^2) as calculated from the following:   Height as of this encounter: 5\' 5"  (1.651 m).   Weight as of this encounter: 112.038 kg (247 lb). Advance diet Up with therapy Plan for discharge tomorrow Discharge home with home health  DVT Prophylaxis - Xarelto Weight-Bearing as tolerated to left leg No vaccines. D/C O2 and Pulse OX and try on [address]  Sara [name] 12/23/2012, 8:42 AM

## 2012-12-24 LAB — BASIC METABOLIC PANEL
BUN: 14 mg/dL (ref 6–23)
Calcium: 8.7 mg/dL (ref 8.4–10.5)
GFR calc Af Amer: >90 mL/min (ref >90)
GFR calc non Af Amer: 88 mL/min — ABNORMAL LOW (ref >90)
Glucose, Bld: 158 mg/dL — ABNORMAL HIGH (ref 70–99)
Potassium: 4.8 mEq/L (ref 3.5–5.1)

## 2012-12-24 LAB — CBC
HCT: 30 % — ABNORMAL LOW (ref 36.0–46.0)
MCH: 30.1 pg (ref 26.0–34.0)
MCHC: 33.7 g/dL (ref 30.0–36.0)
RDW: 13.3 % (ref 11.5–15.5)

## 2012-12-24 MED ORDER — HYDROCODONE-ACETAMINOPHEN 5-325 MG PO TABS
1.0000 | ORAL_TABLET | ORAL | Status: DC | PRN
  Administered 2012-12-24: 2 via ORAL
  Filled 2012-12-24: qty 2

## 2012-12-24 MED ORDER — METHOCARBAMOL 500 MG PO TABS: 500.0000 mg | ORAL_TABLET | Freq: Four times a day (QID) | ORAL | Status: DC | PRN

## 2012-12-24 MED ORDER — RIVAROXABAN 10 MG PO TABS: 10.0000 mg | ORAL_TABLET | Freq: Every day | ORAL | Status: DC

## 2012-12-24 MED ORDER — TRAMADOL HCL 50 MG PO TABS: 50.0000 mg | ORAL_TABLET | Freq: Four times a day (QID) | ORAL | Status: DC | PRN

## 2012-12-24 MED ORDER — HYDROCODONE-ACETAMINOPHEN 5-325 MG PO TABS: 1.0000 | ORAL_TABLET | ORAL | Status: DC | PRN

## 2012-12-24 NOTE — Evaluation (Signed)
Occupational Therapy Evaluation Patient Details Name: Sara [name] MRN: [id number] DOB: 1945/07/24 Today's Date: 12/24/2012 Time: 5784-6962 OT Time Calculation (min): 21 min  OT Assessment / Plan / Recommendation Clinical Impression  this 67 year old female was admitted for L TKA.  Reviewed all education re:  adls and bathroom transfers.  Pt does not need any further OT at this time.      OT Assessment  Patient does not need any further OT services    Follow Up Recommendations  No OT follow up    Barriers to Discharge      Equipment Recommendations  None recommended by OT    Recommendations for Other Services    Frequency       Precautions / Restrictions Precautions Precautions: Knee Required Braces or Orthoses: Knee Immobilizer - Left Knee Immobilizer - Left: Discontinue once straight leg raise with < 10 degree lag Restrictions Weight Bearing Restrictions: No LLE Weight Bearing: Weight bearing as tolerated   Pertinent Vitals/Pain LLE sore when weight bearing.  Repositioned.  Pt was premedicated    ADL  Toilet Transfer: Min Pension scheme manager Method: Sit to Barista: Raised toilet seat with arms (or 3-in-1 over toilet) Toileting - Clothing Manipulation and Hygiene: Minimal assistance Where Assessed - Toileting Clothing Manipulation and Hygiene: Sit to stand from 3-in-1 or toilet (needs assist to place/move object under foot for support) Transfers/Ambulation Related to ADLs: ambulated to bathroom ADL Comments: Pt had started ADLs:  needed mod A with LB adls. She had done UB with set up.  Reviewed tub bench transfer as she can borrow this.  She used it in '09 after R TKA. Pt stated she put towels all around bench.  Recommended she add an inexpensive shower line and slit it to go between segments on bench.  Her husband will assist with adls.    OT Diagnosis:    OT Problem List:   OT Treatment Interventions:     OT Goals    Visit Information  Last OT Received On: 12/24/12 Assistance Needed: +1    Subjective Data  Subjective: They said I can shower tomorrow Patient Stated Goal: wants to go home before 11:00   Prior Functioning     Home Living Lives With: Spouse Bathroom Shower/Tub: Engineer, manufacturing systems: [employer] Home Adaptive Equipment: Bedside commode/3-in-1;Crutches Additional Comments: can borrow tub bench from sister. Prior Function Level of Independence: Independent Communication Communication: No difficulties         Vision/Perception     Cognition  Cognition Arousal/Alertness: Awake/alert Behavior During Therapy: WFL for tasks assessed/performed Overall Cognitive Status: Within Functional Limits for tasks assessed    Extremity/Trunk Assessment Right Upper Extremity Assessment RUE ROM/Strength/Tone: [facility] for tasks assessed Left Upper Extremity Assessment LUE ROM/Strength/Tone: WFL for tasks assessed     Mobility Bed Mobility Sit to Supine: 4: Min assist Details for Bed Mobility Assistance: assist for LLE and cues for technique Transfers Sit to Stand: 4: Min guard;From bed;From chair/3-in-1;From elevated surface;With upper extremity assist Details for Transfer Assistance: cues for hand/foot placement     Exercise     Balance     End of Session OT - End of Session Activity Tolerance: Patient tolerated treatment well Patient left: in chair;with call bell/phone within reach CPM Left Knee CPM Left Knee: Off  GO     Jamiel Goncalves 12/24/2012, 9:02 AM Marica Otter, OTR/L [phone] 12/24/2012

## 2012-12-24 NOTE — Progress Notes (Signed)
   Subjective: 2 Days Post-Op Procedure(s) (LRB): LEFT TOTAL KNEE ARTHROPLASTY (Left) Patient reports pain as mild.   Patient seen in rounds with Dr. Lequita Halt. Patient is well, and has had no acute complaints or problems Patient is ready to go home later today.  Objective: Vital signs in last 24 hours: Temp:  [97.3 F (36.3 C)-98.5 F (36.9 C)] 97.3 F (36.3 C) (06/04 0542) Pulse Rate:  [73-82] 76 (06/04 0542) Resp:  [16-18] 18 (06/04 0542) BP: (129-148)/(73-88) 148/88 mmHg (06/04 0542) SpO2:  [90 %-95 %] 90 % (06/04 0542)  Intake/Output from previous day:  Intake/Output Summary (Last 24 hours) at 12/24/12 0729 Last data filed at 12/24/12 0543  Gross per 24 hour  Intake    560 ml  Output   1275 ml  Net   -715 ml    Intake/Output this shift:    Labs:  Recent Labs  12/23/12 0432 12/24/12 0425  HGB 11.0* 10.1*    Recent Labs  12/23/12 0432 12/24/12 0425  WBC 8.0 11.8*  RBC 3.50* 3.36*  HCT 31.2* 30.0*  PLT 121* 126*    Recent Labs  12/23/12 0432 12/24/12 0425  NA 135 136  K 4.4 4.8  CL 104 104  CO2 24 26  BUN 17 14  CREATININE 0.69 0.69  GLUCOSE 199* 158*  CALCIUM 8.6 8.7   No results found for this basename: LABPT, INR,  in the last 72 hours  EXAM: General - Patient is Alert, Appropriate and Oriented Extremity - Neurovascular intact Sensation intact distally Dorsiflexion/Plantar flexion intact No cellulitis present Incision - clean, dry, no drainage, healing Motor Function - intact, moving foot and toes well on exam.   Assessment/Plan: 2 Days Post-Op Procedure(s) (LRB): LEFT TOTAL KNEE ARTHROPLASTY (Left) Procedure(s) (LRB): LEFT TOTAL KNEE ARTHROPLASTY (Left) Past Medical History  Diagnosis Date  . HTN (hypertension)   . Hyperlipidemia   . GERD (gastroesophageal reflux disease)   . CAD (coronary artery disease)     a. s/p Xience DES x 3 to LAD;  b. nuc study 02/27/10: EF 67% no ischemia;  c.  echo 11/12: Mild LVH, EF 55-65%, grade 1  diastolic dysfunction, moderate LAE;  d.  LHC 07/03/11: LAD stents patent, RCA 25%, EF 55-65% ;  e. Adeno. Myoview 5/14:  No ischemia, EF 68%  . Obesity   . Other psoriasis   . Arthritis   . Hemorrhoid   . Jaundice   . Diverticular disease   . Vertigo   . Shortness of breath   . 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 Bronchitis   . Myocardial infarction     hx of x 2   . OSA (obstructive sleep apnea)     needs no cpap per patient   . Hepatitis     yellow jaundice as a child    Principal Problem:   OA (osteoarthritis) of knee Active Problems:   Postoperative anemia due to acute blood loss  Estimated body mass index is 41.1 kg/(m^2) as calculated from the following:   Height as of this encounter: 5\' 5"  (1.651 m).   Weight as of this encounter: 112.038 kg (247 lb). Up with therapy Discharge home with home health Diet - Cardiac diet Follow up - in 2 weeks Activity - WBAT Disposition - Home Condition Upon Discharge - Good D/C Meds - See DC Summary DVT Prophylaxis - Xarelto  Chizuko Trine 12/24/2012, 7:29 AM

## 2012-12-24 NOTE — Progress Notes (Signed)
Physical Therapy Treatment Patient Details Name: Sara [name] MRN: [id number] DOB: 1946/01/03 Today's Date: 12/24/2012 Time: 4098-1191 PT Time Calculation (min): 40 min  PT Assessment / Plan / Recommendation Comments on Treatment Session  POD # 2 R TKR am session.  Pt plans to D/C to home today.  Pt able to perform 10 active SLR so instructed pt to D/C KI.  Assisted pt OOB to amb in hallway and practiced going up/down one step forward using RW with 50% VC's on proper sequencing.  performed TE's and given handout HEP.      Follow Up Recommendations  Home health PT     Does the patient have the potential to tolerate intense rehabilitation     Barriers to Discharge        Equipment Recommendations       Recommendations for Other Services    Frequency 7X/week   Plan      Precautions / Restrictions Precautions Precautions: Knee Precaution Comments: Pt able to perform 10 active SLR so D/C KI Knee Immobilizer - Left: Discontinue once straight leg raise with < 10 degree lag Restrictions Weight Bearing Restrictions: No LLE Weight Bearing: Weight bearing as tolerated   Pertinent Vitals/Pain C/o "soreness" ICE applied    Mobility  Bed Mobility Bed Mobility: Sit to Supine Supine to Sit: 5: Supervision Details for Bed Mobility Assistance: increased time Transfers Transfers: Sit to Stand;Stand to Sit Sit to Stand: 5: Supervision;From bed Stand to Sit: 5: Supervision;To chair/3-in-1 Details for Transfer Assistance: increased time, good handplacement Ambulation/Gait Ambulation/Gait Assistance: 4: Min guard Ambulation Distance (Feet): 85 Feet Assistive device: Rolling walker Ambulation/Gait Assistance Details: increased time Gait Pattern: Step-to pattern Gait velocity: decreased Stairs: Yes Stairs Assistance: 4: Min guard Stair Management Technique: No rails;Forwards;With walker Number of Stairs: 1    Exercises   Total Knee Replacement TE's 10 reps B LE ankle pumps 10 reps  knee presses 10 reps heel slides  10 reps SAQ's 10 reps SLR's 10 reps ABD Followed by ICE   PT Goals                                          progressing    Visit Information  Last PT Received On: 12/24/12 Assistance Needed: +1    Subjective Data      Cognition       Balance   fair+  End of Session PT - End of Session Equipment Utilized During Treatment: Gait belt Activity Tolerance: Patient tolerated treatment well Patient left: in chair;with call bell/phone within reach   Felecia Shelling  PTA [facility]  Acute  Rehab Pager      [phone]

## 2012-12-30 NOTE — Discharge Summary (Signed)
Physician Discharge Summary   Patient ID: Sara [name] MRN: [id number] DOB/AGE: 68-Mar-1947 68 y.o.  Admit date: 12/22/2012 Discharge date: 12/24/2012  Primary Diagnosis:  Osteoarthritis Left knee  Admission Diagnoses:  Past Medical History  Diagnosis Date  . HTN (hypertension)   . Hyperlipidemia   . GERD (gastroesophageal reflux disease)   . CAD (coronary artery disease)     a. s/p Xience DES x 3 to LAD;  b. nuc study 02/27/10: EF 67% no ischemia;  c.  echo 11/12: Mild LVH, EF 55-65%, grade 1 diastolic dysfunction, moderate LAE;  d.  LHC 07/03/11: LAD stents patent, RCA 25%, EF 55-65% ;  e. Adeno. Myoview 5/14:  No ischemia, EF 68%  . Obesity   . Other psoriasis   . Arthritis   . Hemorrhoid   . Jaundice   . Diverticular disease   . Vertigo   . Shortness of breath   . 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 Bronchitis   . Myocardial infarction     hx of x 2   . OSA (obstructive sleep apnea)     needs no cpap per patient   . Hepatitis     yellow jaundice as a child    Discharge Diagnoses:   Principal Problem:   OA (osteoarthritis) of knee Active Problems:   Postoperative anemia due to acute blood loss  Estimated body mass index is 41.1 kg/(m^2) as calculated from the following:   Height as of this encounter: 5\' 5"  (1.651 m).   Weight as of this encounter: 112.038 kg (247 lb).  Procedure:  Procedure(s) (LRB): LEFT TOTAL KNEE ARTHROPLASTY (Left)   Consults: None  HPI: Sara [name] is a 67 y.o. year old female with end stage OA of her left knee with progressively worsening pain and dysfunction. She has constant pain, with activity and at rest and significant functional deficits with difficulties even with ADLs. She has had extensive non-op management including analgesics, injections of cortisone and viscosupplements, and home exercise program, but remains in significant pain with significant dysfunction. Radiographs show bone on bone arthritis medial  and patellofemoral. She presents now for left Total Knee Arthroplasty.   Laboratory Data: Admission on 12/22/2012, Discharged on 12/24/2012  Component Date Value Range Status  . WBC 12/23/2012 8.0  4.0 - 10.5 K/uL Final  . RBC 12/23/2012 3.50* 3.87 - 5.11 MIL/uL Final  . Hemoglobin 12/23/2012 11.0* 12.0 - 15.0 g/dL Final  . HCT 40/98/1191 31.2* 36.0 - 46.0 % Final  . MCV 12/23/2012 89.1  78.0 - 100.0 fL Final  . MCH 12/23/2012 31.4  26.0 - 34.0 pg Final  . MCHC 12/23/2012 35.3  30.0 - 36.0 g/dL Final  . RDW 47/82/9562 13.1  11.5 - 15.5 % Final  . Platelets 12/23/2012 121* 150 - 400 K/uL Final  . Sodium 12/23/2012 135  135 - 145 mEq/L Final  . Potassium 12/23/2012 4.4  3.5 - 5.1 mEq/L Final  . Chloride 12/23/2012 104  96 - 112 mEq/L Final  . CO2 12/23/2012 24  19 - 32 mEq/L Final  . Glucose, Bld 12/23/2012 199* 70 - 99 mg/dL Final  . BUN 13/02/6577 17  6 - 23 mg/dL Final  . Creatinine, Ser 12/23/2012 0.69  0.50 - 1.10 mg/dL Final  . Calcium 46/96/2952 8.6  8.4 - 10.5 mg/dL Final  . GFR calc non Af Amer 12/23/2012 88* >90 mL/min Final  . GFR calc Af Amer 12/23/2012 >90  >90  mL/min Final   Comment:                                 The eGFR has been calculated                          using the CKD EPI equation.                          This calculation has not been                          validated in all clinical                          situations.                          eGFR's persistently                          <90 mL/min signify                          possible Chronic Kidney Disease.  . WBC 12/24/2012 11.8* 4.0 - 10.5 K/uL Final  . RBC 12/24/2012 3.36* 3.87 - 5.11 MIL/uL Final  . Hemoglobin 12/24/2012 10.1* 12.0 - 15.0 g/dL Final  . HCT 16/04/9603 30.0* 36.0 - 46.0 % Final  . MCV 12/24/2012 89.3  78.0 - 100.0 fL Final  . MCH 12/24/2012 30.1  26.0 - 34.0 pg Final  . MCHC 12/24/2012 33.7  30.0 - 36.0 g/dL Final  . RDW 54/03/8118 13.3  11.5 - 15.5 % Final  . Platelets  12/24/2012 126* 150 - 400 K/uL Final  . Sodium 12/24/2012 136  135 - 145 mEq/L Final  . Potassium 12/24/2012 4.8  3.5 - 5.1 mEq/L Final  . Chloride 12/24/2012 104  96 - 112 mEq/L Final  . CO2 12/24/2012 26  19 - 32 mEq/L Final  . Glucose, Bld 12/24/2012 158* 70 - 99 mg/dL Final  . BUN 14/78/2956 14  6 - 23 mg/dL Final  . Creatinine, Ser 12/24/2012 0.69  0.50 - 1.10 mg/dL Final  . Calcium 21/30/8657 8.7  8.4 - 10.5 mg/dL Final  . GFR calc non Af Amer 12/24/2012 88* >90 mL/min Final  . GFR calc Af Amer 12/24/2012 >90  >90 mL/min Final   Comment:                                 The eGFR has been calculated                          using the CKD EPI equation.                          This calculation has not been                          validated in all clinical  situations.                          eGFR's persistently                          <90 mL/min signify                          possible Chronic Kidney Disease.  Hospital Outpatient Visit on 12/16/2012  Component Date Value Range Status  . aPTT 12/16/2012 31  24 - 37 seconds Final  . WBC 12/16/2012 5.3  4.0 - 10.5 K/uL Final  . RBC 12/16/2012 4.45  3.87 - 5.11 MIL/uL Final  . Hemoglobin 12/16/2012 14.1  12.0 - 15.0 g/dL Final  . HCT 40/98/1191 40.5  36.0 - 46.0 % Final  . MCV 12/16/2012 91.0  78.0 - 100.0 fL Final  . MCH 12/16/2012 31.7  26.0 - 34.0 pg Final  . MCHC 12/16/2012 34.8  30.0 - 36.0 g/dL Final  . RDW 47/82/9562 13.3  11.5 - 15.5 % Final  . Platelets 12/16/2012 147* 150 - 400 K/uL Final  . Sodium 12/16/2012 138  135 - 145 mEq/L Final  . Potassium 12/16/2012 5.0  3.5 - 5.1 mEq/L Final  . Chloride 12/16/2012 105  96 - 112 mEq/L Final  . CO2 12/16/2012 27  19 - 32 mEq/L Final  . Glucose, Bld 12/16/2012 125* 70 - 99 mg/dL Final  . BUN 13/02/6577 15  6 - 23 mg/dL Final  . Creatinine, Ser 12/16/2012 0.82  0.50 - 1.10 mg/dL Final  . Calcium 46/96/2952 9.7  8.4 - 10.5 mg/dL Final  . Total Protein  12/16/2012 6.6  6.0 - 8.3 g/dL Final  . Albumin 84/13/2440 3.3* 3.5 - 5.2 g/dL Final  . AST 05/19/2535 22  0 - 37 U/L Final  . ALT 12/16/2012 18  0 - 35 U/L Final  . Alkaline Phosphatase 12/16/2012 59  39 - 117 U/L Final  . Total Bilirubin 12/16/2012 0.7  0.3 - 1.2 mg/dL Final  . GFR calc non Af Amer 12/16/2012 72* >90 mL/min Final  . GFR calc Af Amer 12/16/2012 84* >90 mL/min Final   Comment:                                 The eGFR has been calculated                          using the CKD EPI equation.                          This calculation has not been                          validated in all clinical                          situations.                          eGFR's persistently                          <90 mL/min signify  possible Chronic Kidney Disease.  Marland Kitchen Prothrombin Time 12/16/2012 13.7  11.6 - 15.2 seconds Final  . INR 12/16/2012 1.06  0.00 - 1.49 Final  . ABO/RH(D) 12/16/2012 O NEG   Final  . Antibody Screen 12/16/2012 NEG   Final  . Sample Expiration 12/16/2012 12/25/2012   Final  . Color, Urine 12/16/2012 YELLOW  YELLOW Final  . APPearance 12/16/2012 CLEAR  CLEAR Final  . Specific Gravity, Urine 12/16/2012 1.017  1.005 - 1.030 Final  . pH 12/16/2012 8.0  5.0 - 8.0 Final  . Glucose, UA 12/16/2012 NEGATIVE  NEGATIVE mg/dL Final  . Hgb urine dipstick 12/16/2012 NEGATIVE  NEGATIVE Final  . Bilirubin Urine 12/16/2012 NEGATIVE  NEGATIVE Final  . Ketones, ur 12/16/2012 NEGATIVE  NEGATIVE mg/dL Final  . Protein, ur 16/04/9603 NEGATIVE  NEGATIVE mg/dL Final  . Urobilinogen, UA 12/16/2012 1.0  0.0 - 1.0 mg/dL Final  . Nitrite 54/03/8118 NEGATIVE  NEGATIVE Final  . Leukocytes, UA 12/16/2012 NEGATIVE  NEGATIVE Final   MICROSCOPIC NOT DONE ON URINES WITH NEGATIVE PROTEIN, BLOOD, LEUKOCYTES, NITRITE, OR GLUCOSE <1000 mg/dL.  Marland Kitchen MRSA, PCR 12/16/2012 NEGATIVE  NEGATIVE Final  . Staphylococcus aureus 12/16/2012 NEGATIVE  NEGATIVE Final   Comment:                                   The Xpert SA Assay (FDA                          approved for NASAL specimens                          in patients over 56 years of age),                          is one component of                          a comprehensive surveillance                          program.  Test performance has                          been validated by [employer] for patients greater                          than or equal to 43 year old.                          It is not intended                          to diagnose infection nor to                          guide or monitor treatment.  Office Visit on 12/10/2012  Component Date Value Range Status  . Sodium 12/10/2012 140  135 - 145 mEq/L Final  . Potassium 12/10/2012 4.2  3.5 - 5.1 mEq/L Final  . Chloride 12/10/2012 107  96 - 112 mEq/L Final  . CO2 12/10/2012 27  19 - 32 mEq/L Final  . Glucose, Bld 12/10/2012 88  70 - 99 mg/dL Final  . BUN 46/96/2952 13  6 - 23 mg/dL Final  . Creatinine, Ser 12/10/2012 0.8  0.4 - 1.2 mg/dL Final  . Calcium 84/13/2440 8.9  8.4 - 10.5 mg/dL Final  . GFR 05/19/2535 74.84  >60.00 mL/min Final  . Pro B Natriuretic peptide (BNP) 12/10/2012 138.0* 0.0 - 100.0 pg/mL Final     X-Rays:Dg Chest 2 View  12/16/2012   *RADIOLOGY REPORT*  Clinical Data: Hypertension, preoperative evaluation for knee surgery  CHEST - 2 VIEW  Comparison: 12/09/2008  Findings: The heart and pulmonary vascularity are within normal limits.  The lungs are clear bilaterally.  No acute bony abnormality is seen.  IMPRESSION: No acute abnormality noted.   Original Report Authenticated By: Alcide Clever, M.D.    EKG: Orders placed in visit on 12/10/12  . EKG 12-LEAD     Hospital Course: Sara [name] is a 67 y.o. who was admitted to [facility]. They were brought to the operating room on 12/22/2012 and underwent Procedure(s): LEFT TOTAL KNEE ARTHROPLASTY.  Patient tolerated the procedure well and was  later transferred to the recovery room and then to the orthopaedic floor for postoperative care.  They were given PO and IV analgesics for pain control following their surgery.  They were given 24 hours of postoperative antibiotics of  Anti-infectives   Start     Dose/Rate Route Frequency Ordered Stop   12/22/12 1900  ceFAZolin (ANCEF) IVPB 2 g/50 mL premix     2 g 100 mL/hr over 30 Minutes Intravenous Every 6 hours 12/22/12 1645 12/23/12 0218   12/22/12 1015  ceFAZolin (ANCEF) IVPB 2 g/50 mL premix     2 g 100 mL/hr over 30 Minutes Intravenous On call to O.R. 12/22/12 1004 12/22/12 1249     and started on DVT prophylaxis in the form of Xarelto.   PT and OT were ordered for total joint protocol.  Discharge planning consulted to help with postop disposition and equipment needs.  Patient had a decent night on the evening of surgery.  They started to get up OOB with therapy on day one. Hemovac drain was pulled without difficulty.  Continued to work with therapy into day two.  Dressing was changed on day two and the incision was healing well.  Patient was seen in rounds on POD 2 and was ready to go home later that day after therapy.   Discharge Medications: Prior to Admission medications   Medication Sig Start Date End Date Taking? Authorizing Provider  lisinopril (PRINIVIL,ZESTRIL) 20 MG tablet Take 20 mg by mouth every morning.    Yes Historical Provider, MD  rosuvastatin (CRESTOR) 40 MG tablet Take 20-40 mg by mouth daily. Alternate 20 mg and 40 mg given cramps   Yes Historical Provider, MD  spironolactone (ALDACTONE) 25 MG tablet Take 12.5 mg by mouth 2 (two) times daily.    Yes Historical Provider, MD  HYDROcodone-acetaminophen (NORCO/VICODIN) 5-325 MG per tablet Take 1-2 tablets by mouth every 4 (four) hours as needed. 12/24/12   Alexzandrew Julien Girt, PA-C  Magnesium 250 MG TABS Take 250 mg by mouth daily.    Historical Provider, MD  methocarbamol (ROBAXIN) 500 MG tablet Take 1 tablet (500 mg  total) by mouth every 6 (six) hours as needed. 12/24/12  Alexzandrew Perkins, PA-C  nitroGLYCERIN (NITROSTAT) 0.4 MG SL tablet Place 0.4 mg under the tongue every 5 (five) minutes as needed. For chest pain    Historical Provider, MD  omeprazole (PRILOSEC) 20 MG capsule Take 20 mg by mouth daily.  04/17/12   Historical Provider, MD  potassium chloride (K-DUR,KLOR-CON) 10 MEQ tablet Take 10 mEq by mouth daily.    Historical Provider, MD  rivaroxaban (XARELTO) 10 MG TABS tablet Take 1 tablet (10 mg total) by mouth daily with breakfast. Take Xarelto for two and a half more weeks, then discontinue Xarelto. Once the patient has completed the Xarelto, they may resume the 81 mg Aspirin. 12/24/12   Alexzandrew Perkins, PA-C  sodium chloride (OCEAN) 0.65 % nasal spray Place 1 spray into the nose at bedtime as needed for congestion.    Historical Provider, MD  traMADol (ULTRAM) 50 MG tablet Take 1-2 tablets (50-100 mg total) by mouth every 6 (six) hours as needed (mild pain). 12/24/12   Alexzandrew Julien Girt, PA-C    Diet: Cardiac diet Activity:WBAT Follow-up:in 2 weeks Disposition - Home Discharged Condition: good   Discharge Orders   Future Orders Complete By Expires     Call MD / Call 911  As directed     Comments:      If you experience chest pain or shortness of breath, CALL 911 and be transported to the hospital emergency room.  If you develope a fever above 101 F, pus (white drainage) or increased drainage or redness at the wound, or calf pain, call your surgeon's office.    Change dressing  As directed     Comments:      Change dressing daily with sterile 4 x 4 inch gauze dressing and apply TED hose. Do not submerge the incision under water.    Constipation Prevention  As directed     Comments:      Drink plenty of fluids.  Prune juice may be helpful.  You may use a stool softener, such as Colace (over the counter) 100 mg twice a day.  Use MiraLax (over the counter) for constipation as needed.     Diet - low sodium heart healthy  As directed     Discharge instructions  As directed     Comments:      Pick up stool softner and laxative for home. Do not submerge incision under water. May shower. Continue to use ice for pain and swelling from surgery. Hip precautions.  Total Hip Protocol.  Take Xarelto for two and a half more weeks, then discontinue Xarelto.    Do not put a pillow under the knee. Place it under the heel.  As directed     Do not sit on low chairs, stoools or toilet seats, as it may be difficult to get up from low surfaces  As directed     Driving restrictions  As directed     Comments:      No driving until released by the physician.    Increase activity slowly as tolerated  As directed     Lifting restrictions  As directed     Comments:      No lifting until released by the physician.    Patient may shower  As directed     Comments:      You may shower without a dressing once there is no drainage.  Do not wash over the wound.  If drainage remains, do not shower until drainage stops.  TED hose  As directed     Comments:      Use stockings (TED hose) for 3 weeks on both leg(s).  You may remove them at night for sleeping.    Weight bearing as tolerated  As directed         Medication List    STOP taking these medications       aspirin 81 MG tablet     CALCIUM 600 + D PO     fish oil-omega-3 fatty acids 1000 MG capsule     HYDROcodone-acetaminophen 5-500 MG per tablet  Commonly known as:  VICODIN      TAKE these medications       HYDROcodone-acetaminophen 5-325 MG per tablet  Commonly known as:  NORCO/VICODIN  Take 1-2 tablets by mouth every 4 (four) hours as needed.     lisinopril 20 MG tablet  Commonly known as:  PRINIVIL,ZESTRIL  Take 20 mg by mouth every morning.     Magnesium 250 MG Tabs  Take 250 mg by mouth daily.     methocarbamol 500 MG tablet  Commonly known as:  ROBAXIN  Take 1 tablet (500 mg total) by mouth every 6 (six) hours  as needed.     nitroGLYCERIN 0.4 MG SL tablet  Commonly known as:  NITROSTAT  Place 0.4 mg under the tongue every 5 (five) minutes as needed. For chest pain     omeprazole 20 MG capsule  Commonly known as:  PRILOSEC  Take 20 mg by mouth daily.     potassium chloride 10 MEQ tablet  Commonly known as:  K-DUR,KLOR-CON  Take 10 mEq by mouth daily.     rivaroxaban 10 MG Tabs tablet  Commonly known as:  XARELTO  Take 1 tablet (10 mg total) by mouth daily with breakfast. Take Xarelto for two and a half more weeks, then discontinue Xarelto.  Once the patient has completed the Xarelto, they may resume the 81 mg Aspirin.     rosuvastatin 40 MG tablet  Commonly known as:  CRESTOR  Take 20-40 mg by mouth daily. Alternate 20 mg and 40 mg given cramps     sodium chloride 0.65 % nasal spray  Commonly known as:  OCEAN  Place 1 spray into the nose at bedtime as needed for congestion.     spironolactone 25 MG tablet  Commonly known as:  ALDACTONE  Take 12.5 mg by mouth 2 (two) times daily.     traMADol 50 MG tablet  Commonly known as:  ULTRAM  Take 1-2 tablets (50-100 mg total) by mouth every 6 (six) hours as needed (mild pain).           Follow-up Information   Follow up with Loanne Drilling, MD. Schedule an appointment as soon as possible for a visit on 01/07/2013. (Call [phone] tomorrow to make the appointment)    Contact information:   [address] [place] [address] [phone]       Signed: Patrica Duel 12/30/2012, 4:59 AM

## 2013-01-13 ENCOUNTER — Ambulatory Visit: Payer: Medicare Other | Attending: Orthopedic Surgery | Admitting: Physical Therapy

## 2013-01-13 DIAGNOSIS — R5381 Other malaise: Secondary | ICD-10-CM | POA: Insufficient documentation

## 2013-01-13 DIAGNOSIS — IMO0001 Reserved for inherently not codable concepts without codable children: Secondary | ICD-10-CM | POA: Insufficient documentation

## 2013-01-13 DIAGNOSIS — Z96659 Presence of unspecified artificial knee joint: Secondary | ICD-10-CM | POA: Insufficient documentation

## 2013-01-13 DIAGNOSIS — M25569 Pain in unspecified knee: Secondary | ICD-10-CM | POA: Insufficient documentation

## 2013-01-13 DIAGNOSIS — M25669 Stiffness of unspecified knee, not elsewhere classified: Secondary | ICD-10-CM | POA: Insufficient documentation

## 2013-01-14 ENCOUNTER — Ambulatory Visit: Payer: Medicare Other | Admitting: Physical Therapy

## 2013-01-19 ENCOUNTER — Ambulatory Visit: Payer: Medicare Other | Admitting: Physical Therapy

## 2013-01-21 ENCOUNTER — Ambulatory Visit: Payer: Medicare Other | Attending: Orthopedic Surgery | Admitting: Physical Therapy

## 2013-01-21 DIAGNOSIS — R5381 Other malaise: Secondary | ICD-10-CM | POA: Insufficient documentation

## 2013-01-21 DIAGNOSIS — M25669 Stiffness of unspecified knee, not elsewhere classified: Secondary | ICD-10-CM | POA: Insufficient documentation

## 2013-01-21 DIAGNOSIS — M25569 Pain in unspecified knee: Secondary | ICD-10-CM | POA: Insufficient documentation

## 2013-01-21 DIAGNOSIS — IMO0001 Reserved for inherently not codable concepts without codable children: Secondary | ICD-10-CM | POA: Insufficient documentation

## 2013-01-21 DIAGNOSIS — Z96659 Presence of unspecified artificial knee joint: Secondary | ICD-10-CM | POA: Insufficient documentation

## 2013-01-26 ENCOUNTER — Ambulatory Visit: Payer: Medicare Other | Admitting: Physical Therapy

## 2013-01-28 ENCOUNTER — Ambulatory Visit: Payer: Medicare Other | Admitting: Physical Therapy

## 2013-01-30 ENCOUNTER — Ambulatory Visit: Payer: Medicare Other | Admitting: Physical Therapy

## 2013-02-02 ENCOUNTER — Ambulatory Visit: Payer: Medicare Other | Admitting: Physical Therapy

## 2013-02-04 ENCOUNTER — Ambulatory Visit: Payer: Medicare Other | Admitting: Physical Therapy

## 2013-02-06 ENCOUNTER — Ambulatory Visit: Payer: Medicare Other | Admitting: Physical Therapy

## 2013-04-02 ENCOUNTER — Other Ambulatory Visit: Payer: Self-pay | Admitting: Internal Medicine

## 2013-05-27 ENCOUNTER — Ambulatory Visit: Payer: Medicare Other | Admitting: Nurse Practitioner

## 2013-06-01 ENCOUNTER — Encounter: Payer: Self-pay | Admitting: Physician Assistant

## 2013-06-01 ENCOUNTER — Ambulatory Visit (INDEPENDENT_AMBULATORY_CARE_PROVIDER_SITE_OTHER): Payer: Medicare Other | Admitting: Physician Assistant

## 2013-06-01 VITALS — BP 100/68 | HR 89 | Ht 65.0 in | Wt 252.0 lb

## 2013-06-01 DIAGNOSIS — E785 Hyperlipidemia, unspecified: Secondary | ICD-10-CM

## 2013-06-01 DIAGNOSIS — I2510 Atherosclerotic heart disease of native coronary artery without angina pectoris: Secondary | ICD-10-CM

## 2013-06-01 DIAGNOSIS — I498 Other specified cardiac arrhythmias: Secondary | ICD-10-CM

## 2013-06-01 DIAGNOSIS — I10 Essential (primary) hypertension: Secondary | ICD-10-CM

## 2013-06-01 DIAGNOSIS — R0602 Shortness of breath: Secondary | ICD-10-CM

## 2013-06-01 DIAGNOSIS — I471 Supraventricular tachycardia: Secondary | ICD-10-CM

## 2013-06-01 LAB — CBC WITH DIFFERENTIAL/PLATELET
Basophils Absolute: 0 10*3/uL (ref 0.0–0.1)
Basophils Relative: 0 % (ref 0–1)
HCT: 39.5 % (ref 36.0–46.0)
MCHC: 33.9 g/dL (ref 30.0–36.0)
Monocytes Absolute: 0.7 10*3/uL (ref 0.1–1.0)
Neutro Abs: 3.5 10*3/uL (ref 1.7–7.7)
Neutrophils Relative %: 48 % (ref 43–77)
Platelets: 205 10*3/uL (ref 150–400)
RDW: 15.9 % — ABNORMAL HIGH (ref 11.5–15.5)
WBC: 7.3 10*3/uL (ref 4.0–10.5)

## 2013-06-01 LAB — BASIC METABOLIC PANEL
BUN: 18 mg/dL (ref 6–23)
CO2: 23 mEq/L (ref 19–32)
Calcium: 9.4 mg/dL (ref 8.4–10.5)
Creat: 0.95 mg/dL (ref 0.50–1.10)
Glucose, Bld: 129 mg/dL — ABNORMAL HIGH (ref 70–99)

## 2013-06-01 MED ORDER — DILTIAZEM HCL ER COATED BEADS 120 MG PO CP24: 120.0000 mg | ORAL_CAPSULE | Freq: Every day | ORAL | Status: DC

## 2013-06-01 MED ORDER — FUROSEMIDE 20 MG PO TABS: 20.0000 mg | ORAL_TABLET | Freq: Every day | ORAL | Status: DC

## 2013-06-01 NOTE — Progress Notes (Signed)
[address], [address] [place], [place]  [address] Phone: [phone] Fax:  [phone]  Date:  06/01/2013   ID:  Sara [name], DOB Jan 10, 1946, MRN [id number]  PCP:  Samuel Jester, DO  Cardiologist:  Dr. Marca Ancona     History of Present Illness: Sara [name] is a 67 y.o. female with a history of CAD, status post DES x3 the LAD in 04/2009, AVNRT, status post ablation, HTN, sleep apnea. Echo (05/2011):  Mild LVH, EF 55-60%, Gr 1 DD, mod LAE.  She was admitted in 12/12 with SVT with associated chest pain and elevated troponin. Cardiac cath demonstrated patent LAD stents. She underwent RFCA in 09/2011.  Last Myoview (11/2012):  Normal, EF 68%.  She underwent left TKR in 12/2012.  Post op course was uneventful.    She did have an episode of [place]T at the beach [place] ago.  This broke with a vagal maneuver.  She went to the ED and was started on Diltiazem 120 QD.  She has not had a recurrence.  She continues to note DOE.  She describes NYHA Class IIb symptoms. But, she also notes dyspnea with talking on the phone and singing in the choir at church.  This has been a chronic symptom for close to a year now.  She has some assoc wheezing.  She has an occasional cough.  She has never been a smoker.  She seeps on 2 pillows and does note probable PND.  She has had some mild ankle edema.  She has an occasional chest pain described as heaviness.  Her symptoms have been ongoing since before her stress test this summer.  She thinks her dyspnea may be getting worse.    Recent Labs: 06/10/2012: HDL 51.10; LDL (calc) 101*  12/10/2012: Pro B Natriuretic peptide (BNP) 138.0*  12/16/2012: ALT 18  12/24/2012: Creatinine 0.69; Hemoglobin 10.1*; Potassium 4.8   Wt Readings from Last 3 Encounters:  06/01/13 252 lb (114.306 kg)  12/22/12 247 lb (112.038 kg)  12/22/12 247 lb (112.038 kg)     Past Medical History  Diagnosis Date  . HTN (hypertension)   . Hyperlipidemia   . GERD (gastroesophageal  reflux disease)   . CAD (coronary artery disease)     a. s/p Xience DES x 3 to LAD;  b. nuc study 02/27/10: EF 67% no ischemia;  c.  echo 11/12: Mild LVH, EF 55-65%, grade 1 diastolic dysfunction, moderate LAE;  d.  LHC 07/03/11: LAD stents patent, RCA 25%, EF 55-65% ;  e. Adeno. Myoview 5/14:  No ischemia, EF 68%  . Obesity   . Other psoriasis   . Arthritis   . Hemorrhoid   . Jaundice   . Diverticular disease   . Vertigo   . Shortness of breath   . 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 Bronchitis   . Myocardial infarction     hx of x 2   . OSA (obstructive sleep apnea)     needs no cpap per patient   . Hepatitis     yellow jaundice as a child   . Hx of echocardiogram     a.  Echo (05/2011):  Mild LVH, EF 55-60%, Gr 1 DD, mod LAE.    Current Outpatient Prescriptions  Medication Sig Dispense Refill  . ADACEL 11-21-13.5 LF-MCG/0.5 injection       . AFLURIA PRESERVATIVE FREE injection       . CRESTOR 40  MG tablet TAKE 1 TABLET DAILY  30 tablet  11  . diltiazem (CARDIZEM CD) 120 MG 24 hr capsule       . HYDROcodone-acetaminophen (NORCO/VICODIN) 5-325 MG per tablet Take 1-2 tablets by mouth every 4 (four) hours as needed.  80 tablet  0  . lisinopril (PRINIVIL,ZESTRIL) 20 MG tablet Take 20 mg by mouth every morning.       . Magnesium 250 MG TABS Take 250 mg by mouth daily.      . meclizine (ANTIVERT) 25 MG tablet       . methocarbamol (ROBAXIN) 500 MG tablet Take 1 tablet (500 mg total) by mouth every 6 (six) hours as needed.  80 tablet  0  . nitroGLYCERIN (NITROSTAT) 0.4 MG SL tablet Place 0.4 mg under the tongue every 5 (five) minutes as needed. For chest pain      . omeprazole (PRILOSEC) 20 MG capsule Take 20 mg by mouth daily.       . potassium chloride (K-DUR) 10 MEQ tablet       . potassium chloride (K-DUR,KLOR-CON) 10 MEQ tablet Take 10 mEq by mouth daily.      . sodium chloride (OCEAN) 0.65 % nasal spray Place 1 spray into the nose at bedtime as needed for  congestion.      Marland Kitchen spironolactone (ALDACTONE) 25 MG tablet Take 12.5 mg by mouth 2 (two) times daily.       . traMADol (ULTRAM) 50 MG tablet Take 1-2 tablets (50-100 mg total) by mouth every 6 (six) hours as needed (mild pain).  60 tablet  0   No current facility-administered medications for this visit.    Allergies:   Tape; Codeine; and Nifedipine   Social History:  The patient  reports that she has never smoked. She has never used smokeless tobacco. She reports that she does not drink alcohol or use illicit drugs.   Family History:  The patient's family history includes Aneurysm in her father; Heart failure in her mother; Hypertension in her mother; Muscular dystrophy in her daughter; Seizures in her brother and son; Stroke in her mother. There is no history of Colon cancer.   ROS:  Please see the history of present illness.   She has occasional hemorrhoidal bleeding.  She also had recent diarrhea.   All other systems reviewed and negative.   PHYSICAL EXAM: VS:  BP 100/68  Pulse 89  Ht 5\' 5"  (1.651 m)  Wt 252 lb (114.306 kg)  BMI 41.93 kg/m2 Well nourished, well developed, in no acute distress HEENT: normal Neck: difficult to assess JVD - ? Mildly elevated Cardiac:  normal S1, S2; RRR; no murmur; no S3 Lungs:  clear to auscultation bilaterally, no wheezing, rhonchi or rales Abd: soft, nontender, no hepatomegaly Ext: trace bilateral LE edema Skin: warm and dry Neuro:  CNs 2-12 intact, no focal abnormalities noted  EKG:  NSR, HR 89, LBBB, no changes     ASSESSMENT AND PLAN:  1. Dyspnea:  This may be multifactorial.  She may have an element of diastolic CHF. She did have mild diastolic dysfunction on an echo in the past.  She had a recent myoview that was low risk and her symptoms pre-date this test.  She is also obese and is likely deconditioned from knee surgery this past summer.  She has noted some wheezing.  She has never been a smoker.  I will add low dose Lasix at 20 mg QD.   Check BMET, BNP, CBC today.  Repeat BMET in 1 week.  Obtain CXR.  Schedule Echo to reassess LVF, diastolic function and R heart pressures.  May need to consider R/L heart cath if symptoms persist or worsen.  Consider PFTs if symptoms do not improve. 2. AVNRT:  She had a recent recurrence by symptoms.  No evidence of SVT when she arrived at the ED.  Will request records.  May need to have her wear an event monitor if she has more symptoms.  Continue Cardizem 120 QD for now.  3. CAD:  Recent nuclear study low risk.  Continue current Rx with ASA and statin. 4. Hypertension:  Controlled.  5. Hyperlipidemia:  Continue statin. 6. Disposition:  F/u with Dr. Marca Ancona or me in 3-4 weeks.  Signed, Tereso Newcomer, PA-C  06/01/2013 4:21 PM

## 2013-06-01 NOTE — Addendum Note (Signed)
Addended by: Tarri Fuller on: 06/01/2013 05:17 PM   Modules accepted: Orders

## 2013-06-01 NOTE — Patient Instructions (Signed)
A REFILL FOR CARDIZEM WAS SENT INTO [facility] TODAY START LASIX 20 MG DAILY; RX SENT  LABS TODAY; BMET, CBC W/DIFF,  BNP  REPEAT BMET 1 WEEK  PLEASE SCHEDULE ECHO  PLEASE GO TO [facility] ON [address] FROM [facility] FOR A CHEST X-RAY  PLEASE FOLLOW UP WITH SCOTT WEAVER, PAC 3-4 WEEKS SAME DAY DR. Shirlee Latch IS IN THE OFFICE

## 2013-06-02 ENCOUNTER — Ambulatory Visit (INDEPENDENT_AMBULATORY_CARE_PROVIDER_SITE_OTHER)
Admission: RE | Admit: 2013-06-02 | Discharge: 2013-06-02 | Disposition: A | Discharge status: Home / Self Care | Payer: Medicare Other | Source: Ambulatory Visit | Attending: Physician Assistant | Admitting: Physician Assistant

## 2013-06-02 ENCOUNTER — Telehealth: Payer: Self-pay | Admitting: *Deleted

## 2013-06-02 DIAGNOSIS — R0602 Shortness of breath: Secondary | ICD-10-CM

## 2013-06-02 DIAGNOSIS — I2510 Atherosclerotic heart disease of native coronary artery without angina pectoris: Secondary | ICD-10-CM

## 2013-06-02 NOTE — Telephone Encounter (Signed)
lmptcb for lab results and recommendations 

## 2013-06-02 NOTE — Telephone Encounter (Signed)
Spoke with pt. Lab results and PA's recommendations given Pt is aware to take the lasix once a day for 3 days, then 3 x a week thereafter. Also pt is to have labs on 06/09/13. Pt is aware also to F/U with her PCP for high sugar level. Pt is aware of chest xray result.

## 2013-06-02 NOTE — Telephone Encounter (Signed)
Follow up ° ° ° ° ° °Returning Carol's call °

## 2013-06-09 ENCOUNTER — Other Ambulatory Visit (INDEPENDENT_AMBULATORY_CARE_PROVIDER_SITE_OTHER): Payer: Medicare Other

## 2013-06-09 DIAGNOSIS — I10 Essential (primary) hypertension: Secondary | ICD-10-CM

## 2013-06-09 LAB — BASIC METABOLIC PANEL
BUN: 12 mg/dL (ref 6–23)
Calcium: 9.8 mg/dL (ref 8.4–10.5)
GFR: 69.74 mL/min (ref >60.00)
Glucose, Bld: 121 mg/dL — ABNORMAL HIGH (ref 70–99)
Sodium: 137 mEq/L (ref 135–145)

## 2013-06-16 ENCOUNTER — Ambulatory Visit (HOSPITAL_COMMUNITY): Payer: Medicare Other | Attending: Cardiology | Admitting: Cardiology

## 2013-06-16 DIAGNOSIS — R0609 Other forms of dyspnea: Secondary | ICD-10-CM | POA: Insufficient documentation

## 2013-06-16 DIAGNOSIS — E669 Obesity, unspecified: Secondary | ICD-10-CM | POA: Insufficient documentation

## 2013-06-16 DIAGNOSIS — I2510 Atherosclerotic heart disease of native coronary artery without angina pectoris: Secondary | ICD-10-CM

## 2013-06-16 DIAGNOSIS — I252 Old myocardial infarction: Secondary | ICD-10-CM | POA: Insufficient documentation

## 2013-06-16 DIAGNOSIS — E785 Hyperlipidemia, unspecified: Secondary | ICD-10-CM | POA: Insufficient documentation

## 2013-06-16 DIAGNOSIS — R0602 Shortness of breath: Secondary | ICD-10-CM

## 2013-06-16 DIAGNOSIS — I10 Essential (primary) hypertension: Secondary | ICD-10-CM

## 2013-06-16 DIAGNOSIS — I471 Supraventricular tachycardia: Secondary | ICD-10-CM

## 2013-06-16 DIAGNOSIS — R0989 Other specified symptoms and signs involving the circulatory and respiratory systems: Secondary | ICD-10-CM | POA: Insufficient documentation

## 2013-06-16 NOTE — Progress Notes (Signed)
Echo performed. 

## 2013-06-17 ENCOUNTER — Encounter: Payer: Self-pay | Admitting: Physician Assistant

## 2013-07-02 ENCOUNTER — Ambulatory Visit (INDEPENDENT_AMBULATORY_CARE_PROVIDER_SITE_OTHER): Payer: Medicare Other | Admitting: Physician Assistant

## 2013-07-02 ENCOUNTER — Encounter: Payer: Self-pay | Admitting: Physician Assistant

## 2013-07-02 VITALS — BP 106/80 | HR 86 | Ht 64.0 in | Wt 253.0 lb

## 2013-07-02 DIAGNOSIS — I498 Other specified cardiac arrhythmias: Secondary | ICD-10-CM

## 2013-07-02 DIAGNOSIS — I4719 Other supraventricular tachycardia: Secondary | ICD-10-CM

## 2013-07-02 DIAGNOSIS — I471 Supraventricular tachycardia: Secondary | ICD-10-CM

## 2013-07-02 DIAGNOSIS — R0602 Shortness of breath: Secondary | ICD-10-CM

## 2013-07-02 DIAGNOSIS — E785 Hyperlipidemia, unspecified: Secondary | ICD-10-CM

## 2013-07-02 DIAGNOSIS — I10 Essential (primary) hypertension: Secondary | ICD-10-CM

## 2013-07-02 DIAGNOSIS — I2510 Atherosclerotic heart disease of native coronary artery without angina pectoris: Secondary | ICD-10-CM

## 2013-07-02 NOTE — Patient Instructions (Addendum)
Your physician recommends that you continue on your current medications as directed. Please refer to the Current Medication list given to you today.   Your physician has recommended that you have a pulmonary function test. DX: Dyspnea Pulmonary Function Tests are a group of tests that measure how well air moves in and out of your lungs.  Your physician recommends that you schedule a follow-up appointment in: 1 month with Dr. Gerda Diss after PFT's

## 2013-07-02 NOTE — Progress Notes (Signed)
[address] [place], [place]  [address] Phone: [phone] Fax:  [phone]  Date:  07/02/2013   ID:  JERRY HAUGEN, DOB [birth year], MRN [id number]  PCP:  Samuel Jester, DO  Cardiologist:  Dr. Marca Ancona     History of Present Illness: Sara [name] is a 67 y.o. female with a hx of CAD, s/p DES x3 to the LAD in 04/2009, AVNRT, s/p ablation, HTN, sleep apnea. Echo (05/2011):  Mild LVH, EF 55-60%, Gr 1 DD, mod LAE.  She was admitted in 12/12 with SVT with associated chest pain and elevated troponin. Cardiac cath demonstrated patent LAD stents. She underwent RFCA in 09/2011.  Last Myoview (11/2012):  Normal, EF 68%.  She underwent left TKR in 12/2012.  Post op course was uneventful.    I saw her 06/01/13. She had an episode of SVT at the beach several weeks prior and was placed on diltiazem. She complained of dyspnea when I saw her. I thought this was multifactorial. She did have some edema on exam and I prescribed Lasix. Echocardiogram was performed and demonstrated mild LVH with mild focal basal hypertrophy of the septum, normal LV function, normal wall motion and mild diastolic dysfunction. Chest x-ray was unremarkable.  Since last seen, she is still experiencing DOE.  She has felt this way for about a year.  It seems to be getting worse.  She denies chest pain.  No syncope.  She sleeps on 2 pillows.  No LE edema.  She is NYHA Class 2b-3.    Recent Labs: 12/10/2012: Pro B Natriuretic peptide (BNP) 138.0*  12/16/2012: ALT 18  06/01/2013: Hemoglobin 13.4  06/09/2013: Creatinine 0.9; Potassium 4.7   Wt Readings from Last 3 Encounters:  07/02/13 253 lb (114.76 kg)  06/01/13 252 lb (114.306 kg)  12/22/12 247 lb (112.038 kg)     Past Medical History  Diagnosis Date  . HTN (hypertension)   . Hyperlipidemia   . GERD (gastroesophageal reflux disease)   . CAD (coronary artery disease)     a. s/p Xience DES x 3 to LAD;  b. nuc study 02/27/10: EF 67% no ischemia;  c.  echo  11/12: Mild LVH, EF 55-65%, grade 1 diastolic dysfunction, moderate LAE;  d.  LHC 07/03/11: LAD stents patent, RCA 25%, EF 55-65% ;  e. Adeno. Myoview 5/14:  No ischemia, EF 68%  . Obesity   . Other psoriasis   . Arthritis   . Hemorrhoid   . Jaundice   . Diverticular disease   . Vertigo   . Shortness of breath   . 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 Bronchitis   . Myocardial infarction     hx of x 2   . OSA (obstructive sleep apnea)     needs no cpap per patient   . Hepatitis     yellow jaundice as a child   . Hx of echocardiogram     a.  Echo (05/2011):  Mild LVH, EF 55-60%, Gr 1 DD, mod LAE.b. Echo (11/14):  Mild LVH, mild focal basal septal hypertrophy, EF 55-60%, Gr 1 DD, mild LAE, atrial septal aneurysm    Current Outpatient Prescriptions  Medication Sig Dispense Refill  . aspirin 81 MG tablet Take 81 mg by mouth daily.      . CRESTOR 40 MG tablet TAKE 1 TABLET DAILY  30 tablet  11  . diltiazem (CARDIZEM CD) 120 MG 24 hr capsule  Take 1 capsule (120 mg total) by mouth daily.  30 capsule  11  . furosemide (LASIX) 20 MG tablet Take 1 tablet (20 mg total) by mouth daily.  30 tablet  11  . HYDROcodone-acetaminophen (NORCO/VICODIN) 5-325 MG per tablet Take 1-2 tablets by mouth every 4 (four) hours as needed.  80 tablet  0  . lisinopril (PRINIVIL,ZESTRIL) 20 MG tablet Take 20 mg by mouth every morning.       . Magnesium 250 MG TABS Take 250 mg by mouth daily.      . meclizine (ANTIVERT) 25 MG tablet Take 25 mg by mouth 2 (two) times daily as needed.       . methocarbamol (ROBAXIN) 500 MG tablet Take 1 tablet (500 mg total) by mouth every 6 (six) hours as needed.  80 tablet  0  . nitroGLYCERIN (NITROSTAT) 0.4 MG SL tablet Place 0.4 mg under the tongue every 5 (five) minutes as needed. For chest pain      . omeprazole (PRILOSEC) 20 MG capsule Take 20 mg by mouth daily.       . potassium chloride (K-DUR,KLOR-CON) 10 MEQ tablet Take 10 mEq by mouth daily.      .  sodium chloride (OCEAN) 0.65 % nasal spray Place 1 spray into the nose at bedtime as needed for congestion.      Marland Kitchen spironolactone (ALDACTONE) 25 MG tablet Take 12.5 mg by mouth 2 (two) times daily.       . traMADol (ULTRAM) 50 MG tablet Take 1-2 tablets (50-100 mg total) by mouth every 6 (six) hours as needed (mild pain).  60 tablet  0   No current facility-administered medications for this visit.    Allergies:   Tape; Codeine; and Nifedipine   Social History:  The patient  reports that she has never smoked. She has never used smokeless tobacco. She reports that she does not drink alcohol or use illicit drugs.   Family History:  The patient's family history includes Aneurysm in her father; Heart failure in her mother; Hypertension in her mother; Muscular dystrophy in her daughter; Seizures in her brother and son; Stroke in her mother. There is no history of Colon cancer.   ROS:  Please see the history of present illness.   She has a URI and is coughing.  She notes white sputum.  No fevers.  All other systems reviewed and negative.   PHYSICAL EXAM: VS:  BP 106/80  Pulse 86  Ht 5\' 4"  (1.626 m)  Wt 253 lb (114.76 kg)  BMI 43.41 kg/m2  SpO2 99% Well nourished, well developed, in no acute distress HEENT: normal Neck: difficult to assess JVD  Cardiac:  normal S1, S2; RRR; no murmur; no S3 Lungs:  clear to auscultation bilaterally, no wheezing, rhonchi or rales Abd: soft, nontender, no hepatomegaly Ext: no edema Skin: warm and dry Neuro:  CNs 2-12 intact, no focal abnormalities noted  EKG:  NSR, HR 86, LBBB, no changes     ASSESSMENT AND PLAN:  1. Dyspnea:  Etiology not entirely clear. Recent echo with normal LVF, normal R sided pressures and mild diastolic dysfunction.  She is a lifetime non-smoker. 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  I discussed her case with Dr. Marca Ancona. We will arrange PFTs with DLCO.  If these are abnormal, refer to  pulmonary.  If they are normal, we will need to consider R/L heart cath.  2. AVNRT:  No recurrence. Continue Cardizem 120 QD for now.  3. CAD:  Recent nuclear study low risk.  Continue current Rx with ASA and statin. If PFTs normal, we will likely arrange R/L heart cath.  4. Hypertension:  Controlled.  5. Hyperlipidemia:  Continue statin. 6. Disposition:  F/u with Dr. Marca Ancona or me in 1 month.   Signed, Tereso Newcomer, PA-C  07/02/2013 11:30 AM

## 2013-07-20 ENCOUNTER — Ambulatory Visit (INDEPENDENT_AMBULATORY_CARE_PROVIDER_SITE_OTHER): Payer: Medicare Other | Admitting: Internal Medicine

## 2013-07-20 DIAGNOSIS — I2510 Atherosclerotic heart disease of native coronary artery without angina pectoris: Secondary | ICD-10-CM

## 2013-07-20 DIAGNOSIS — E785 Hyperlipidemia, unspecified: Secondary | ICD-10-CM

## 2013-07-20 DIAGNOSIS — I471 Supraventricular tachycardia: Secondary | ICD-10-CM

## 2013-07-20 DIAGNOSIS — R0602 Shortness of breath: Secondary | ICD-10-CM

## 2013-07-20 DIAGNOSIS — I10 Essential (primary) hypertension: Secondary | ICD-10-CM

## 2013-07-20 LAB — PULMONARY FUNCTION TEST
FEF2575-%Change-Post: 3 %
FEV1-%Change-Post: 0 %
FEV1-%Pred-Pre: 122 %
FEV1-Post: 2.86 L
FEV1FVC-%Change-Post: 6 %
FEV6-Pre: 3.55 L
FEV6FVC-%Pred-Pre: 103 %
FVC-Post: 3.36 L
FVC-Pre: 3.56 L
Post FEV1/FVC ratio: 85 %
Post FEV6/FVC ratio: 100 %
RV % pred: 102 %
RV: 2.21 L
TLC % pred: 110 %
TLC: 5.59 L

## 2013-07-20 NOTE — Progress Notes (Signed)
PFT done today. 

## 2013-07-21 ENCOUNTER — Other Ambulatory Visit: Payer: Self-pay | Admitting: Internal Medicine

## 2013-08-03 ENCOUNTER — Ambulatory Visit: Payer: Medicare Other | Admitting: Physician Assistant

## 2013-08-04 ENCOUNTER — Encounter: Payer: Self-pay | Admitting: *Deleted

## 2013-08-04 ENCOUNTER — Telehealth: Payer: Self-pay | Admitting: *Deleted

## 2013-08-04 NOTE — Telephone Encounter (Signed)
tried both the home and cell #'s and could not get calls to go through so I mailed out result letter

## 2013-08-11 ENCOUNTER — Ambulatory Visit (INDEPENDENT_AMBULATORY_CARE_PROVIDER_SITE_OTHER): Payer: Medicare Other | Admitting: Cardiology

## 2013-08-11 ENCOUNTER — Encounter: Payer: Self-pay | Admitting: *Deleted

## 2013-08-11 ENCOUNTER — Encounter: Payer: Self-pay | Admitting: Cardiology

## 2013-08-11 VITALS — BP 122/70 | HR 73 | Ht 64.0 in | Wt 249.0 lb

## 2013-08-11 DIAGNOSIS — R079 Chest pain, unspecified: Secondary | ICD-10-CM

## 2013-08-11 DIAGNOSIS — I471 Supraventricular tachycardia: Secondary | ICD-10-CM

## 2013-08-11 DIAGNOSIS — I498 Other specified cardiac arrhythmias: Secondary | ICD-10-CM

## 2013-08-11 DIAGNOSIS — I10 Essential (primary) hypertension: Secondary | ICD-10-CM

## 2013-08-11 DIAGNOSIS — R0602 Shortness of breath: Secondary | ICD-10-CM

## 2013-08-11 DIAGNOSIS — I2510 Atherosclerotic heart disease of native coronary artery without angina pectoris: Secondary | ICD-10-CM

## 2013-08-11 LAB — CBC WITH DIFFERENTIAL/PLATELET
BASOS ABS: 0 10*3/uL (ref 0.0–0.1)
Basophils Relative: 0.4 % (ref 0.0–3.0)
EOS PCT: 1.3 % (ref 0.0–5.0)
Eosinophils Absolute: 0.1 10*3/uL (ref 0.0–0.7)
HEMATOCRIT: 41.2 % (ref 36.0–46.0)
HEMOGLOBIN: 13.9 g/dL (ref 12.0–15.0)
LYMPHS ABS: 2.3 10*3/uL (ref 0.7–4.0)
LYMPHS PCT: 27 % (ref 12.0–46.0)
MCHC: 33.7 g/dL (ref 30.0–36.0)
MCV: 91.5 fl (ref 78.0–100.0)
MONOS PCT: 8 % (ref 3.0–12.0)
Monocytes Absolute: 0.7 10*3/uL (ref 0.1–1.0)
NEUTROS ABS: 5.3 10*3/uL (ref 1.4–7.7)
Neutrophils Relative %: 63.3 % (ref 43.0–77.0)
Platelets: 190 10*3/uL (ref 150.0–400.0)
RBC: 4.5 Mil/uL (ref 3.87–5.11)
RDW: 13.9 % (ref 11.5–14.6)
WBC: 8.4 10*3/uL (ref 4.5–10.5)

## 2013-08-11 LAB — BASIC METABOLIC PANEL
BUN: 20 mg/dL (ref 6–23)
CALCIUM: 9.1 mg/dL (ref 8.4–10.5)
CO2: 26 mEq/L (ref 19–32)
Chloride: 105 mEq/L (ref 96–112)
Creatinine, Ser: 0.8 mg/dL (ref 0.4–1.2)
GFR: 73.64 mL/min (ref >60.00)
GLUCOSE: 95 mg/dL (ref 70–99)
POTASSIUM: 4.3 meq/L (ref 3.5–5.1)
SODIUM: 137 meq/L (ref 135–145)

## 2013-08-11 LAB — PROTIME-INR
INR: 1.1 ratio — ABNORMAL HIGH (ref 0.8–1.0)
PROTHROMBIN TIME: 11.5 s (ref 10.2–12.4)

## 2013-08-11 MED ORDER — ATORVASTATIN CALCIUM 80 MG PO TABS: 80.0000 mg | ORAL_TABLET | Freq: Every day | ORAL | Status: DC

## 2013-08-11 NOTE — Progress Notes (Signed)
Patient ID: Sara [name], female   DOB: Aug 16, 1945, 68 y.o.   MRN: [id number] PCP: Dr. Melina Copa  68 yo with history of CAD and AVNRT s/p ablation presents for cardiology followup.  She had PCI to the LAD in 10/10.  In 12/12, she was admitted to [facility] with SVT and associated chest pain and elevated troponin.  Cath in 12/12 showed patent LAD stents.  She had AVNRT ablation 3/12.  Last ischemic evaluation was back in 5/14 when she had an adenosine Cardiolite with no evidence for ischemia or infarction.    She has been seen a couple of times recently by Richardson Dopp for exertional dyspnea.  She had the Cardiolite as described above in 5/14.  She had an echo in 12/14 with normal EF and no significant abnormalities, and she had PFTs in 12/14 with normal volumes and spirometry and mildly abnormal DLCO.  The exertional dyspnea has been present for about 2 months now.  She is very short of breath walking up steps.  She gets very short of breath walking into church from the parking lot.  She has bendopnea.  She gets short of breath getting dressed.  As mentioned, this is all new over about 2 months.   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 Obesity 3. HTN 4. GERD 5. OSA: Mild, not on CPAP 6. Psoriasis 7. CAD: Xience DES x 3 to LAD in 10/10.  Myoview 8/11 with EF 67%, no ischemia.  Echo (11/12): EF 55-60%, mild LVH, moderate LAE.  LHC (12/12): Patent LAD stents.  Adenosine Cardiolite (5/14) with EF 68%, no ischemia/infarction.  Echo (11/14) with EF 55-60%, mild LVH, grade I diastolic dysfunction.  8. Diverticulosis 9. Chronic LBBB 10. Dyspnea: PFTs (12/14) with normal lung volumes and spirometry, mildly decreased DLCO.   FH: CHF  SH: Married, lives in [place].  3  children.  Nonsmoker.  Retired.   ROS: All systems reviewed and negative except as per HPI.     Current Outpatient Prescriptions  Medication Sig Dispense Refill  . aspirin 81 MG tablet Take 81 mg by mouth daily.      Marland Kitchen diltiazem (CARDIZEM CD) 120 MG 24 hr capsule Take 1 capsule (120 mg total) by mouth daily.  30 capsule  11  . furosemide (LASIX) 20 MG tablet Take 1 tablet (20 mg total) by mouth daily.  30 tablet  11  . HYDROcodone-acetaminophen (NORCO/VICODIN) 5-325 MG per tablet Take 1-2 tablets by mouth every 4 (four) hours as needed.  80 tablet  0  . lisinopril (PRINIVIL,ZESTRIL) 20 MG tablet Take 20 mg by mouth every morning.       . Magnesium 250 MG TABS Take 250 mg by mouth daily.      . meclizine (ANTIVERT) 25 MG tablet Take 25 mg by mouth 2 (two) times daily as needed.       . methocarbamol (ROBAXIN) 500 MG tablet Take 1 tablet (500 mg total) by mouth every 6 (six) hours as needed.  80 tablet  0  . nitroGLYCERIN (NITROSTAT) 0.4 MG SL tablet Place 0.4 mg under the tongue every 5 (five) minutes as needed. For chest pain      . omeprazole (PRILOSEC) 20 MG capsule Take 20  mg by mouth daily.       . potassium chloride (K-DUR,KLOR-CON) 10 MEQ tablet Take 10 mEq by mouth daily.      . sodium chloride (OCEAN) 0.65 % nasal spray Place 1 spray into the nose at bedtime as needed for congestion.      Marland Kitchen spironolactone (ALDACTONE) 25 MG tablet Take 12.5 mg by mouth 2 (two) times daily.       . traMADol (ULTRAM) 50 MG tablet Take 1-2 tablets (50-100 mg total) by mouth every 6 (six) hours as needed (mild pain).  60 tablet  0  . atorvastatin (LIPITOR) 80 MG tablet Take 1 tablet (80 mg total) by mouth daily.  30 tablet  3   No current facility-administered medications for this visit.    BP 122/70  Pulse 73  Ht 5\' 4"  (1.626 m)  Wt 112.946 kg (249 lb)  BMI 42.72 kg/m2 General: NAD, obese.  Neck: No JVD, no thyromegaly or thyroid nodule.  Lungs: Clear to auscultation bilaterally with normal  respiratory effort. CV: Nondisplaced PMI.  Heart regular S1/S2, no S3/S4, no murmur.  No peripheral edema.  No carotid bruit.  Normal pedal pulses.  Abdomen: Soft, nontender, no hepatosplenomegaly, no distention.  Neurologic: Alert and oriented x 3.  Psych: Normal affect. Extremities: No clubbing or cyanosis.   Assessment/Plan:  AVNRT  Status post ablation in 3/13. She had some recurrent palpitations in the fall but this has resolved since starting diltiazem CD.   Exertional Dyspnea So far, no explanation. Echo unremarkable, PFTs with normal spirometry and lung volumes but mildly decreased DLCO.  She does not appear volume overloaded on exam.  The dyspnea continues to be very bothersome.   - We talked about left and right heart cath today to assess for pulmonary HTN, assess filling pressures, and assess coronaries.  She understands risks and benefits and wishes to proceed with the study.  - If LHC is unremarkable, symptoms may be due to obesity and deconditioning.  However, I am unsure why they are so markedly worse over the last couple of months.  CAD  As above, plan LHC/RHC. Continue ASA 81, ACEI, statin.  HYPERTENSION  BP is under control. She is on spironolactone so will need to follow K carefully.  HYPERLIPIDEMIA  Crestor has become too expensive.  She is going to transition from Crestor 20 mg daily to atorvastatin 80 mg daily.  She has had some myalgias.  She will take coenzyme Q 10 200 mg daily.  Lipids/LFTs in 2 months.    Loralie Champagne 08/11/2013 10:05 PM

## 2013-08-11 NOTE — Patient Instructions (Signed)
Your physician recommends that you have lab work today--BMET/CBCd/PT/INR.  Increase atorvastatin to 80mg  daily.   Take co enzyme Q10 200mg  daily.   Your physician has requested that you have a cardiac catheterization. Cardiac catheterization is used to diagnose and/or treat various heart conditions. Doctors may recommend this procedure for a number of different reasons. The most common reason is to evaluate chest pain. Chest pain can be a symptom of coronary artery disease (CAD), and cardiac catheterization can show whether plaque is narrowing or blocking your heart's arteries. This procedure is also used to evaluate the valves, as well as measure the blood flow and oxygen levels in different parts of your heart. For further information please visit [email or web address]. Please follow instruction sheet, as given. Tuesday January 27,2015  Your physician recommends that you schedule a follow-up appointment in: 3 weeks with Dr Aundra Dubin.

## 2013-08-12 ENCOUNTER — Telehealth: Payer: Self-pay | Admitting: Cardiology

## 2013-08-12 NOTE — Telephone Encounter (Signed)
New message     Talk to a nurse regarding procedure scheduled for next week.

## 2013-08-12 NOTE — Telephone Encounter (Signed)
Answered questions about cath.

## 2013-08-13 ENCOUNTER — Encounter (HOSPITAL_COMMUNITY): Payer: Self-pay

## 2013-08-17 ENCOUNTER — Ambulatory Visit: Payer: Medicare Other | Admitting: Cardiology

## 2013-08-18 ENCOUNTER — Ambulatory Visit (HOSPITAL_COMMUNITY)
Admission: RE | Admit: 2013-08-18 | Discharge: 2013-08-18 | Disposition: A | Discharge status: Home / Self Care | Payer: Medicare Other | Source: Ambulatory Visit | Attending: Cardiology | Admitting: Cardiology

## 2013-08-18 ENCOUNTER — Encounter (HOSPITAL_COMMUNITY)
Admission: RE | Disposition: A | Discharge status: Home / Self Care | Payer: Self-pay | Source: Ambulatory Visit | Attending: Cardiology

## 2013-08-18 DIAGNOSIS — Z9861 Coronary angioplasty status: Secondary | ICD-10-CM | POA: Insufficient documentation

## 2013-08-18 DIAGNOSIS — R5381 Other malaise: Secondary | ICD-10-CM | POA: Insufficient documentation

## 2013-08-18 DIAGNOSIS — K219 Gastro-esophageal reflux disease without esophagitis: Secondary | ICD-10-CM | POA: Insufficient documentation

## 2013-08-18 DIAGNOSIS — I10 Essential (primary) hypertension: Secondary | ICD-10-CM | POA: Insufficient documentation

## 2013-08-18 DIAGNOSIS — I2510 Atherosclerotic heart disease of native coronary artery without angina pectoris: Secondary | ICD-10-CM

## 2013-08-18 DIAGNOSIS — E785 Hyperlipidemia, unspecified: Secondary | ICD-10-CM | POA: Insufficient documentation

## 2013-08-18 DIAGNOSIS — Z7982 Long term (current) use of aspirin: Secondary | ICD-10-CM | POA: Insufficient documentation

## 2013-08-18 DIAGNOSIS — R0609 Other forms of dyspnea: Secondary | ICD-10-CM | POA: Insufficient documentation

## 2013-08-18 DIAGNOSIS — Y849 Medical procedure, unspecified as the cause of abnormal reaction of the patient, or of later complication, without mention of misadventure at the time of the procedure: Secondary | ICD-10-CM | POA: Insufficient documentation

## 2013-08-18 DIAGNOSIS — G4733 Obstructive sleep apnea (adult) (pediatric): Secondary | ICD-10-CM | POA: Insufficient documentation

## 2013-08-18 DIAGNOSIS — E669 Obesity, unspecified: Secondary | ICD-10-CM | POA: Insufficient documentation

## 2013-08-18 DIAGNOSIS — R0989 Other specified symptoms and signs involving the circulatory and respiratory systems: Principal | ICD-10-CM | POA: Insufficient documentation

## 2013-08-18 DIAGNOSIS — I447 Left bundle-branch block, unspecified: Secondary | ICD-10-CM | POA: Insufficient documentation

## 2013-08-18 DIAGNOSIS — Z6841 Body Mass Index (BMI) 40.0 and over, adult: Secondary | ICD-10-CM | POA: Insufficient documentation

## 2013-08-18 HISTORY — PX: LEFT AND RIGHT HEART CATHETERIZATION WITH CORONARY ANGIOGRAM: SHX5449

## 2013-08-18 LAB — POCT I-STAT 3, ART BLOOD GAS (G3+)
Acid-base deficit: 8 mmol/L — ABNORMAL HIGH (ref 0.0–2.0)
BICARBONATE: 18.5 meq/L — AB (ref 20.0–24.0)
O2 Saturation: 92 %
PH ART: 7.252 — AB (ref 7.350–7.450)
PO2 ART: 75 mmHg — AB (ref 80.0–100.0)
TCO2: 20 mmol/L (ref 0–100)
pCO2 arterial: 42 mmHg (ref 35.0–45.0)

## 2013-08-18 LAB — POCT I-STAT 3, VENOUS BLOOD GAS (G3P V)
Acid-base deficit: 5 mmol/L — ABNORMAL HIGH (ref 0.0–2.0)
Bicarbonate: 20.8 mEq/L (ref 20.0–24.0)
O2 SAT: 71 %
PCO2 VEN: 38.3 mmHg — AB (ref 45.0–50.0)
PH VEN: 7.342 — AB (ref 7.250–7.300)
PO2 VEN: 39 mmHg (ref 30.0–45.0)
TCO2: 22 mmol/L (ref 0–100)

## 2013-08-18 SURGERY — LEFT AND RIGHT HEART CATHETERIZATION WITH CORONARY ANGIOGRAM
Anesthesia: LOCAL

## 2013-08-18 MED ORDER — HEPARIN (PORCINE) IN NACL 2-0.9 UNIT/ML-% IJ SOLN
INTRAMUSCULAR | Status: AC
  Filled 2013-08-18: qty 1000

## 2013-08-18 MED ORDER — SODIUM CHLORIDE 0.9 % IJ SOLN: 3.0000 mL | INTRAMUSCULAR | Status: DC | PRN

## 2013-08-18 MED ORDER — FENTANYL CITRATE 0.05 MG/ML IJ SOLN
INTRAMUSCULAR | Status: AC
  Filled 2013-08-18: qty 2

## 2013-08-18 MED ORDER — SODIUM CHLORIDE 0.9 % IJ SOLN: 3.0000 mL | Freq: Two times a day (BID) | INTRAMUSCULAR | Status: DC

## 2013-08-18 MED ORDER — SODIUM CHLORIDE 0.9 % IV SOLN
INTRAVENOUS | Status: DC
  Administered 2013-08-18: 09:00:00 via INTRAVENOUS

## 2013-08-18 MED ORDER — MIDAZOLAM HCL 2 MG/2ML IJ SOLN
INTRAMUSCULAR | Status: AC
  Filled 2013-08-18: qty 2

## 2013-08-18 MED ORDER — NITROGLYCERIN 0.2 MG/ML ON CALL CATH LAB
INTRAVENOUS | Status: AC
  Filled 2013-08-18: qty 1

## 2013-08-18 MED ORDER — SODIUM CHLORIDE 0.9 % IV SOLN: INTRAVENOUS | Status: AC

## 2013-08-18 MED ORDER — ONDANSETRON HCL 4 MG/2ML IJ SOLN: 4.0000 mg | Freq: Four times a day (QID) | INTRAMUSCULAR | Status: DC | PRN

## 2013-08-18 MED ORDER — ASPIRIN 81 MG PO CHEW
81.0000 mg | CHEWABLE_TABLET | ORAL | Status: DC
Start: 2013-08-18 — End: 2013-08-18

## 2013-08-18 MED ORDER — ACETAMINOPHEN 325 MG PO TABS: 650.0000 mg | ORAL_TABLET | ORAL | Status: DC | PRN

## 2013-08-18 MED ORDER — VERAPAMIL HCL 2.5 MG/ML IV SOLN
INTRAVENOUS | Status: AC
  Filled 2013-08-18: qty 2

## 2013-08-18 MED ORDER — SODIUM CHLORIDE 0.9 % IV SOLN: 250.0000 mL | INTRAVENOUS | Status: DC | PRN

## 2013-08-18 MED ORDER — LIDOCAINE HCL (PF) 1 % IJ SOLN
INTRAMUSCULAR | Status: AC
  Filled 2013-08-18: qty 30

## 2013-08-18 NOTE — CV Procedure (Signed)
    Cardiac Catheterization Procedure Note  Name: Sara [name] MRN: [id number] DOB: February 24, 1946  Procedure: Right Heart Cath, Left Heart Cath, Selective Coronary Angiography, LV angiography  Indication: Exertional dyspnea of uncertain etiology, h/o CAD.    Procedural Details:  Allen's test was negative on the right arm.  We were unable to get right radial access.  The radial artery could be cannulated but the wire could not be threaded despite several atttempts. The right groin was prepped, draped, and anesthetized with 1% lidocaine. Using the modified Seldinger technique a 5 French sheath was placed in the right femoral artery and a 7 French sheath was placed in the right femoral vein. A Swan-Ganz catheter was used for the right heart catheterization. Standard protocol was followed for recording of right heart pressures and sampling of oxygen saturations. Fick cardiac output was calculated. Standard Judkins catheters were used for selective coronary angiography and left ventriculography. There were no immediate procedural complications. The patient was transferred to the post catheterization recovery area for further monitoring.  Procedural Findings: Hemodynamics (mmHg) RA 7 RV 28/11 PA 27/12 PCWP mean 9 LV 100/12 AO 107/64  Oxygen saturations: PA 71% AO 92%  Cardiac Output (Fick) 7.2  Cardiac Index (Fick) 3.35   Coronary angiography: Coronary dominance: right  Left mainstem: No significant disease.   Left anterior descending (LAD): After a large D1, there were two non-overlapping stents in the proximal LAD.  There was minimal in-stent restenosis.  There was about 40% stenosis in the LAD between the two stents.   Left circumflex (LCx): There was a moderate ramus with no significant disease.  There was 30% stenosis in the proximal LCx.    Right coronary artery (RCA): Luminal irregularities.   Left ventriculography: Left ventricular systolic function is normal, LVEF is estimated  at 55-60%, there is no significant mitral regurgitation   Final Conclusions:  Filling pressures not significant elevated on RHC, no pulmonary hypertension.  There was no obstructive CAD.   Recommendations:  Suspect exertional dyspnea is primarily related to obesity and deconditioning.  I suggested increased exercise and weight loss.    Sara [name] 08/18/2013, 11:25 AM

## 2013-08-18 NOTE — Interval H&P Note (Signed)
Cath Lab Visit (complete for each Cath Lab visit)  Clinical Evaluation Leading to the Procedure:   ACS: no  Non-ACS:    Anginal Classification: CCS III  Anti-ischemic medical therapy: Minimal Therapy (1 class of medications)  Non-Invasive Test Results: Low-risk stress test findings: cardiac mortality <1%/year  Prior CABG: No previous CABG      History and Physical Interval Note:  08/18/2013 10:35 AM  Sara [name]  has presented today for surgery, with the diagnosis of Heart failure  The various methods of treatment have been discussed with the patient and family. After consideration of risks, benefits and other options for treatment, the patient has consented to  Procedure(s): LEFT AND RIGHT HEART CATHETERIZATION WITH CORONARY ANGIOGRAM (N/A) as a surgical intervention .  The patient's history has been reviewed, patient examined, no change in status, stable for surgery.  I have reviewed the patient's chart and labs.  Questions were answered to the patient's satisfaction.     Anterio Scheel [employer]

## 2013-08-18 NOTE — Progress Notes (Signed)
Discharge instructions reviewed and discussed with pt and spouse both verbalize understanding.

## 2013-08-18 NOTE — Discharge Instructions (Signed)
Angiography, Care After ° °Refer to this sheet in the next few weeks. These instructions provide you with information on caring for yourself after your procedure. Your health care provider may also give you more specific instructions. Your treatment has been planned according to current medical practices, but problems sometimes occur. Call your health care provider if you have any problems or questions after your procedure.  °WHAT TO EXPECT AFTER THE PROCEDURE °After your procedure, it is typical to have the following sensations: °· Minor discomfort or tenderness and a small bump at the catheter insertion site. The bump should usually decrease in size and tenderness within 1 to 2 weeks. °· Any bruising will usually fade within 2 to 4 weeks. °HOME CARE INSTRUCTIONS  °· You may need to keep taking blood thinners if they were prescribed for you. Only take over-the-counter or prescription medicines for pain, fever, or discomfort as directed by your health care provider. °· Do not apply powder or lotion to the site. °· Do not sit in a bathtub, swimming pool, or whirlpool for 5 to 7 days. °· You may shower 24 hours after the procedure. Remove the bandage (dressing) and gently wash the site with plain soap and water. Gently pat the site dry. °· Inspect the site at least twice daily. °· Limit your activity for the first 24 hours. Do not bend, squat, or lift anything over 10 lb (9 kg) or as directed by your health care provider. °· Do not drive home if you are discharged the day of the procedure. Have someone else drive you. Follow instructions about when you can drive or return to work. °SEEK MEDICAL CARE IF: °· You get lightheaded when standing up. °· You have drainage (other than a small amount of blood on the dressing). °· You have chills. °· You have a fever. °· You have redness, warmth, swelling, or pain at the insertion site. °SEEK IMMEDIATE MEDICAL CARE IF:  °· You develop chest pain or shortness of breath, feel  faint, or pass out. °· You have bleeding, swelling larger than a walnut, or drainage from the catheter insertion site. °· You develop pain, discoloration, coldness, or severe bruising in the leg or arm that held the catheter. °· You have heavy bleeding from the site. If this happens, hold pressure on the site. °MAKE SURE YOU: °· Understand these instructions. °· Will watch your condition. °· Will get help right away if you are not doing well or get worse. °Document Released: 01/25/2005 Document Revised: 03/11/2013 Document Reviewed: 12/01/2012 °ExitCare® Patient Information ©2014 [employer]. ° °

## 2013-08-18 NOTE — H&P (View-Only) (Signed)
Patient ID: Sara [name], female   DOB: 02/05/1946, 67 y.o.   MRN: [id number] PCP: Dr. Butler  67 yo with history of CAD and AVNRT s/p ablation presents for cardiology followup.  She had PCI to the LAD in 10/10.  In 12/12, she was admitted to [facility] with SVT and associated chest pain and elevated troponin.  Cath in 12/12 showed patent LAD stents.  She had AVNRT ablation 3/12.  Last ischemic evaluation was back in 5/14 when she had an adenosine Cardiolite with no evidence for ischemia or infarction.    She has been seen a couple of times recently by Scott Weaver for exertional dyspnea.  She had the Cardiolite as described above in 5/14.  She had an echo in 12/14 with normal EF and no significant abnormalities, and she had PFTs in 12/14 with normal volumes and spirometry and mildly abnormal DLCO.  The exertional dyspnea has been present for about 2 months now.  She is very short of breath walking up steps.  She gets very short of breath walking into church from the parking lot.  She has bendopnea.  She gets short of breath getting dressed.  As mentioned, this is all new over about 2 months.   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 Obesity 3. HTN 4. GERD 5. OSA: Mild, not on CPAP 6. Psoriasis 7. CAD: Xience DES x 3 to LAD in 10/10.  Myoview 8/11 with EF 67%, no ischemia.  Echo (11/12): EF 55-60%, mild LVH, moderate LAE.  LHC (12/12): Patent LAD stents.  Adenosine Cardiolite (5/14) with EF 68%, no ischemia/infarction.  Echo (11/14) with EF 55-60%, mild LVH, grade I diastolic dysfunction.  8. Diverticulosis 9. Chronic LBBB 10. Dyspnea: PFTs (12/14) with normal lung volumes and spirometry, mildly decreased DLCO.   FH: CHF  SH: Married, lives in [place].  3  children.  Nonsmoker.  Retired.   ROS: All systems reviewed and negative except as per HPI.     Current Outpatient Prescriptions  Medication Sig Dispense Refill  . aspirin 81 MG tablet Take 81 mg by mouth daily.      . diltiazem (CARDIZEM CD) 120 MG 24 hr capsule Take 1 capsule (120 mg total) by mouth daily.  30 capsule  11  . furosemide (LASIX) 20 MG tablet Take 1 tablet (20 mg total) by mouth daily.  30 tablet  11  . HYDROcodone-acetaminophen (NORCO/VICODIN) 5-325 MG per tablet Take 1-2 tablets by mouth every 4 (four) hours as needed.  80 tablet  0  . lisinopril (PRINIVIL,ZESTRIL) 20 MG tablet Take 20 mg by mouth every morning.       . Magnesium 250 MG TABS Take 250 mg by mouth daily.      . meclizine (ANTIVERT) 25 MG tablet Take 25 mg by mouth 2 (two) times daily as needed.       . methocarbamol (ROBAXIN) 500 MG tablet Take 1 tablet (500 mg total) by mouth every 6 (six) hours as needed.  80 tablet  0  . nitroGLYCERIN (NITROSTAT) 0.4 MG SL tablet Place 0.4 mg under the tongue every 5 (five) minutes as needed. For chest pain      . omeprazole (PRILOSEC) 20 MG capsule Take 20   mg by mouth daily.       . potassium chloride (K-DUR,KLOR-CON) 10 MEQ tablet Take 10 mEq by mouth daily.      . sodium chloride (OCEAN) 0.65 % nasal spray Place 1 spray into the nose at bedtime as needed for congestion.      Marland Kitchen spironolactone (ALDACTONE) 25 MG tablet Take 12.5 mg by mouth 2 (two) times daily.       . traMADol (ULTRAM) 50 MG tablet Take 1-2 tablets (50-100 mg total) by mouth every 6 (six) hours as needed (mild pain).  60 tablet  0  . atorvastatin (LIPITOR) 80 MG tablet Take 1 tablet (80 mg total) by mouth daily.  30 tablet  3   No current facility-administered medications for this visit.    BP 122/70  Pulse 73  Ht 5\' 4"  (1.626 m)  Wt 112.946 kg (249 lb)  BMI 42.72 kg/m2 General: NAD, obese.  Neck: No JVD, no thyromegaly or thyroid nodule.  Lungs: Clear to auscultation bilaterally with normal  respiratory effort. CV: Nondisplaced PMI.  Heart regular S1/S2, no S3/S4, no murmur.  No peripheral edema.  No carotid bruit.  Normal pedal pulses.  Abdomen: Soft, nontender, no hepatosplenomegaly, no distention.  Neurologic: Alert and oriented x 3.  Psych: Normal affect. Extremities: No clubbing or cyanosis.   Assessment/Plan:  AVNRT  Status post ablation in 3/13. She had some recurrent palpitations in the fall but this has resolved since starting diltiazem CD.   Exertional Dyspnea So far, no explanation. Echo unremarkable, PFTs with normal spirometry and lung volumes but mildly decreased DLCO.  She does not appear volume overloaded on exam.  The dyspnea continues to be very bothersome.   - We talked about left and right heart cath today to assess for pulmonary HTN, assess filling pressures, and assess coronaries.  She understands risks and benefits and wishes to proceed with the study.  - If LHC is unremarkable, symptoms may be due to obesity and deconditioning.  However, I am unsure why they are so markedly worse over the last couple of months.  CAD  As above, plan LHC/RHC. Continue ASA 81, ACEI, statin.  HYPERTENSION  BP is under control. She is on spironolactone so will need to follow K carefully.  HYPERLIPIDEMIA  Crestor has become too expensive.  She is going to transition from Crestor 20 mg daily to atorvastatin 80 mg daily.  She has had some myalgias.  She will take coenzyme Q 10 200 mg daily.  Lipids/LFTs in 2 months.    Loralie Champagne 08/11/2013 10:05 PM

## 2013-08-20 ENCOUNTER — Other Ambulatory Visit: Payer: Self-pay | Admitting: Internal Medicine

## 2013-09-02 ENCOUNTER — Encounter: Payer: Self-pay | Admitting: Cardiology

## 2013-09-02 ENCOUNTER — Ambulatory Visit (INDEPENDENT_AMBULATORY_CARE_PROVIDER_SITE_OTHER): Payer: Medicare Other | Admitting: Cardiology

## 2013-09-02 VITALS — BP 122/70 | HR 66 | Ht 64.0 in | Wt 253.0 lb

## 2013-09-02 DIAGNOSIS — E785 Hyperlipidemia, unspecified: Secondary | ICD-10-CM

## 2013-09-02 DIAGNOSIS — R0602 Shortness of breath: Secondary | ICD-10-CM

## 2013-09-02 DIAGNOSIS — I2510 Atherosclerotic heart disease of native coronary artery without angina pectoris: Secondary | ICD-10-CM

## 2013-09-02 DIAGNOSIS — I10 Essential (primary) hypertension: Secondary | ICD-10-CM

## 2013-09-02 NOTE — Patient Instructions (Addendum)
Your physician recommends that you return for a FASTING lipid profile in  3 months and  LFT   Your physician recommends that you continue on your current medications as directed. Please refer to the Current Medication list given to you today.  Your physician wants you to follow-up in: 1 year with Dr. Kendall Flack will receive a reminder letter in the mail two months in advance. If you don't receive a letter, please call our office to schedule the follow-up appointment.

## 2013-09-03 ENCOUNTER — Ambulatory Visit: Payer: Medicare Other | Admitting: Cardiology

## 2013-09-03 NOTE — Progress Notes (Signed)
Patient ID: Sara [name], female   DOB: Jun 08, 1946, 68 y.o.   MRN: [id number] PCP: Dr. Melina Copa  68 yo with history of CAD and AVNRT s/p ablation presents for cardiology followup.  She had PCI to the LAD in 10/10.  In 12/12, she was admitted to [facility] with SVT and associated chest pain and elevated troponin.  Cath in 12/12 showed patent LAD stents.  She had AVNRT ablation 3/12.  She had Adenosine Cardiolite in 5/14 with no evidence for ischemia or infarction.    She has been seen a couple of times recently by Richardson Dopp for exertional dyspnea.  She had the Cardiolite as described above in 5/14.  She had an echo in 12/14 with normal EF and no significant abnormalities, and she had PFTs in 12/14 with normal volumes and spirometry and mildly abnormal DLCO.  The exertional dyspnea has been present for several months now.  She is very short of breath walking up steps.  She gets very short of breath walking into church from the parking lot.  She has bendopnea.  She gets short of breath getting dressed.    Given unexplained exertional symptoms, I took her for left and right heart catheterization in 1/15.  This showed normal filling pressures, no pulmonary hypertension, and no obstructive CAD.  She returns today for post-cath followup.  Dyspnea is unchanged.  Weight is up 4 lbs.   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 Obesity 3. HTN 4. GERD 5. OSA: Mild, not on CPAP 6. Psoriasis 7. CAD: Xience DES x 3 to LAD in 10/10.  Myoview 8/11 with EF 67%, no ischemia.  Echo (11/12): EF 55-60%, mild LVH, moderate LAE.  LHC (12/12): Patent LAD stents.  Adenosine Cardiolite (5/14) with EF 68%, no ischemia/infarction.  Echo (11/14) with EF 55-60%, mild LVH,  grade I diastolic dysfunction.  LHC (1/15) with patent stents in the proximal LAD and 40% stenosis between the stents, EF 55-60%.  8. Diverticulosis 9. Chronic LBBB 10. Dyspnea: PFTs (12/14) with normal lung volumes and spirometry, mildly decreased DLCO. RHC (1/15) with mean RA 7, PA 27/12, mean PCWP 9, CI 3.35.   FH: CHF  SH: Married, lives in [place].  3 children.  Nonsmoker.  Retired.    Current Outpatient Prescriptions  Medication Sig Dispense Refill  . amoxicillin (AMOXIL) 500 MG capsule Take 500 mg by mouth 3 (three) times daily. For 10 days scheduled to end on 08/15/13      . aspirin EC 81 MG tablet Take 81 mg by mouth daily.      Marland Kitchen atorvastatin (LIPITOR) 80 MG tablet Take 1 tablet (80 mg total) by mouth daily.  30 tablet  3  . CALCIUM & MAGNESIUM CARBONATES PO Take 1 tablet by mouth daily.      . Calcium Carbonate-Vitamin D (SUPER CALCIUM 600 + D3 PO) Take 1 tablet by mouth daily.      Marland Kitchen diltiazem (CARDIZEM CD) 120 MG 24 hr capsule Take 1 capsule (120 mg total) by mouth daily.  30 capsule  11  . fluconazole (DIFLUCAN) 100 MG tablet Take 100 mg by mouth once as needed (rash).       Marland Kitchen HYDROcodone-acetaminophen (NORCO/VICODIN) 5-325  MG per tablet Take 1 tablet by mouth every 6 (six) hours as needed for moderate pain.      Marland Kitchen lisinopril (PRINIVIL,ZESTRIL) 20 MG tablet Take 20 mg by mouth every morning.       . Magnesium 250 MG TABS Take 250 mg by mouth daily.      . meclizine (ANTIVERT) 25 MG tablet Take 25 mg by mouth 2 (two) times daily as needed for dizziness.       . methocarbamol (ROBAXIN) 500 MG tablet Take 500 mg by mouth every 6 (six) hours as needed for muscle spasms.      . Multiple Vitamins-Minerals (ZINC PO) Take 1 tablet by mouth daily.      . nitroGLYCERIN (NITROSTAT) 0.4 MG SL tablet Place 0.4 mg under the tongue every 5 (five) minutes as needed. For chest pain      . Omega-3 Fatty Acids (FISH OIL) 1000 MG CAPS Take 1 capsule by mouth 2 (two) times daily.      Marland Kitchen  omeprazole (PRILOSEC) 20 MG capsule Take 20 mg by mouth daily.       Marland Kitchen OVER THE COUNTER MEDICATION Apply 1 application topically at bedtime. To hands and feet for psorasis      . potassium chloride (K-DUR) 10 MEQ tablet TAKE 1 TABLET DAILY  30 tablet  0  . potassium chloride (K-DUR,KLOR-CON) 10 MEQ tablet Take 10 mEq by mouth daily.      . rosuvastatin (CRESTOR) 40 MG tablet Take 40 mg by mouth daily.      . sodium chloride (OCEAN) 0.65 % nasal spray Place 1 spray into the nose at bedtime.       Marland Kitchen spironolactone (ALDACTONE) 25 MG tablet Take 12.5 mg by mouth 2 (two) times daily.        No current facility-administered medications for this visit.    BP 122/70  Pulse 66  Ht 5\' 4"  (1.626 m)  Wt 114.76 kg (253 lb)  BMI 43.41 kg/m2 General: NAD, obese.  Neck: No JVD, no thyromegaly or thyroid nodule.  Lungs: Clear to auscultation bilaterally with normal respiratory effort. CV: Nondisplaced PMI.  Heart regular S1/S2, no S3/S4, no murmur.  No peripheral edema.  No carotid bruit.  Normal pedal pulses.  Abdomen: Soft, nontender, no hepatosplenomegaly, no distention.  Neurologic: Alert and oriented x 3.  Psych: Normal affect. Extremities: No clubbing or cyanosis. Right groin cath site benign.   Assessment/Plan:  AVNRT  Status post ablation in 3/13. She had some recurrent palpitations in the fall but this has resolved since starting diltiazem CD.   Exertional Dyspnea So far, no explanation. Echo unremarkable, PFTs with normal spirometry and lung volumes but mildly decreased DLCO.  She does not appear volume overloaded on exam.  LHC/RHC showed no angiographic CAD and normal right and left hear filling pressures as well as normal PA pressure.  The dyspnea continues to be bothersome.  I suspect that her symptoms are due at least in part due to obesity/deconditioning.  I counseled her strongly to work on diet control and exercise. CAD  Patent stents without obstructive disease.  Continue ASA 81,  ACEI, statin.  HYPERTENSION  BP is under control. She is on spironolactone so will need to follow K carefully.  HYPERLIPIDEMIA  Crestor has become too expensive.  She is going to transition from Crestor 20 mg daily to atorvastatin 80 mg daily.  She has had some myalgias.  She will take coenzyme Q 10 200 mg daily.  Lipids/LFTs  in 3 months.    Loralie Champagne 09/03/2013 11:56 PM

## 2013-09-23 ENCOUNTER — Other Ambulatory Visit: Payer: Self-pay | Admitting: Internal Medicine

## 2013-11-06 ENCOUNTER — Telehealth: Payer: Self-pay | Admitting: Cardiology

## 2013-11-06 DIAGNOSIS — I10 Essential (primary) hypertension: Secondary | ICD-10-CM

## 2013-11-06 DIAGNOSIS — I2510 Atherosclerotic heart disease of native coronary artery without angina pectoris: Secondary | ICD-10-CM

## 2013-11-06 MED ORDER — LOSARTAN POTASSIUM 50 MG PO TABS: 50.0000 mg | ORAL_TABLET | Freq: Every day | ORAL | Status: DC

## 2013-11-06 NOTE — Telephone Encounter (Signed)
Pt.notified

## 2013-11-06 NOTE — Telephone Encounter (Signed)
Can stay off lisinopril and start losartan 50 mg daily with BMET in 2 wks.

## 2013-11-06 NOTE — Telephone Encounter (Signed)
New message          Pt went to PCP and was taken off of bp medicine(lisinopril) to see if this medication was causing the cough she has off and on since December. The cough has gotten worse and wants to know if she needs to be back on lisinopril?

## 2013-11-06 NOTE — Telephone Encounter (Signed)
Lisinopril was stopped by PCP because of pt's cough. Pt has been off lisinopril for 2 days and thinks there may be some improvement in her cough. She is concerned about being off lisinopril because of her BP. I will forward to Dr Aundra Dubin for review.

## 2013-11-19 ENCOUNTER — Other Ambulatory Visit: Payer: Self-pay

## 2013-11-19 DIAGNOSIS — Z1231 Encounter for screening mammogram for malignant neoplasm of breast: Secondary | ICD-10-CM

## 2013-11-24 ENCOUNTER — Other Ambulatory Visit (INDEPENDENT_AMBULATORY_CARE_PROVIDER_SITE_OTHER): Payer: Medicare Other

## 2013-11-24 DIAGNOSIS — I2510 Atherosclerotic heart disease of native coronary artery without angina pectoris: Secondary | ICD-10-CM

## 2013-11-24 DIAGNOSIS — E785 Hyperlipidemia, unspecified: Secondary | ICD-10-CM

## 2013-11-24 DIAGNOSIS — I10 Essential (primary) hypertension: Secondary | ICD-10-CM

## 2013-11-24 LAB — BASIC METABOLIC PANEL
BUN: 13 mg/dL (ref 6–23)
CO2: 25 meq/L (ref 19–32)
Calcium: 9.7 mg/dL (ref 8.4–10.5)
Chloride: 108 mEq/L (ref 96–112)
Creatinine, Ser: 0.8 mg/dL (ref 0.4–1.2)
GFR: 75.7 mL/min (ref >60.00)
GLUCOSE: 121 mg/dL — AB (ref 70–99)
POTASSIUM: 4.3 meq/L (ref 3.5–5.1)
SODIUM: 140 meq/L (ref 135–145)

## 2013-11-24 LAB — LIPID PANEL
CHOL/HDL RATIO: 3
Cholesterol: 151 mg/dL (ref 0–200)
HDL: 47.2 mg/dL (ref >39.00)
LDL Cholesterol: 91 mg/dL (ref 0–99)
Triglycerides: 66 mg/dL (ref 0.0–149.0)
VLDL: 13.2 mg/dL (ref 0.0–40.0)

## 2013-11-24 LAB — HEPATIC FUNCTION PANEL
ALT: 19 U/L (ref 0–35)
AST: 25 U/L (ref 0–37)
Albumin: 3.5 g/dL (ref 3.5–5.2)
Alkaline Phosphatase: 56 U/L (ref 39–117)
BILIRUBIN DIRECT: 0.1 mg/dL (ref 0.0–0.3)
BILIRUBIN TOTAL: 0.9 mg/dL (ref 0.2–1.2)
Total Protein: 6.5 g/dL (ref 6.0–8.3)

## 2013-11-30 ENCOUNTER — Other Ambulatory Visit: Payer: Self-pay | Admitting: Obstetrics and Gynecology

## 2013-11-30 DIAGNOSIS — M8580 Other specified disorders of bone density and structure, unspecified site: Secondary | ICD-10-CM

## 2013-12-02 ENCOUNTER — Ambulatory Visit
Admission: RE | Admit: 2013-12-02 | Discharge: 2013-12-02 | Disposition: A | Discharge status: Home / Self Care | Payer: Medicare Other | Source: Ambulatory Visit

## 2013-12-02 ENCOUNTER — Other Ambulatory Visit: Payer: Self-pay

## 2013-12-02 ENCOUNTER — Encounter (INDEPENDENT_AMBULATORY_CARE_PROVIDER_SITE_OTHER): Payer: Self-pay

## 2013-12-02 DIAGNOSIS — Z1231 Encounter for screening mammogram for malignant neoplasm of breast: Secondary | ICD-10-CM

## 2013-12-03 ENCOUNTER — Ambulatory Visit
Admission: RE | Admit: 2013-12-03 | Discharge: 2013-12-03 | Disposition: A | Discharge status: Home / Self Care | Payer: Medicare Other | Source: Ambulatory Visit | Attending: Obstetrics and Gynecology | Admitting: Obstetrics and Gynecology

## 2013-12-03 DIAGNOSIS — M8580 Other specified disorders of bone density and structure, unspecified site: Secondary | ICD-10-CM

## 2014-03-08 ENCOUNTER — Other Ambulatory Visit: Payer: Self-pay | Admitting: Cardiology

## 2014-03-16 ENCOUNTER — Encounter: Payer: Self-pay | Admitting: Cardiology

## 2014-03-19 ENCOUNTER — Telehealth: Payer: Self-pay | Admitting: *Deleted

## 2014-03-19 DIAGNOSIS — E785 Hyperlipidemia, unspecified: Secondary | ICD-10-CM

## 2014-03-19 DIAGNOSIS — I2510 Atherosclerotic heart disease of native coronary artery without angina pectoris: Secondary | ICD-10-CM

## 2014-03-19 MED ORDER — ATORVASTATIN CALCIUM 80 MG PO TABS: 80.0000 mg | ORAL_TABLET | Freq: Every day | ORAL | Status: DC

## 2014-03-19 NOTE — Telephone Encounter (Signed)
Per Gay Filler, pharmacist--pt should hold atorvastatin when she takes diflucan.   Pt advised, verbalized understanding.

## 2014-03-19 NOTE — Telephone Encounter (Signed)
Notes Recorded by Larey Dresser, MD on 03/17/2014  LDL is a little higher than I would like (goal < 70). Is she taking atorvastatin 80? If so, watch diet better. Crestor was too expensive.  done

## 2014-04-09 ENCOUNTER — Other Ambulatory Visit: Payer: Self-pay | Admitting: Cardiology

## 2014-05-25 ENCOUNTER — Other Ambulatory Visit: Payer: Medicare Other

## 2014-05-25 ENCOUNTER — Other Ambulatory Visit (INDEPENDENT_AMBULATORY_CARE_PROVIDER_SITE_OTHER): Payer: Medicare Other

## 2014-05-25 DIAGNOSIS — E785 Hyperlipidemia, unspecified: Secondary | ICD-10-CM

## 2014-05-25 DIAGNOSIS — I2510 Atherosclerotic heart disease of native coronary artery without angina pectoris: Secondary | ICD-10-CM

## 2014-05-25 LAB — LIPID PANEL
Cholesterol: 164 mg/dL (ref 0–200)
HDL: 44.1 mg/dL (ref >39.00)
LDL Cholesterol: 104 mg/dL — ABNORMAL HIGH (ref 0–99)
NONHDL: 119.9
TRIGLYCERIDES: 80 mg/dL (ref 0.0–149.0)
Total CHOL/HDL Ratio: 4
VLDL: 16 mg/dL (ref 0.0–40.0)

## 2014-05-25 LAB — HEPATIC FUNCTION PANEL
ALBUMIN: 3.1 g/dL — AB (ref 3.5–5.2)
ALK PHOS: 101 U/L (ref 39–117)
ALT: 40 U/L — ABNORMAL HIGH (ref 0–35)
AST: 42 U/L — AB (ref 0–37)
BILIRUBIN DIRECT: 0.2 mg/dL (ref 0.0–0.3)
Total Bilirubin: 1 mg/dL (ref 0.2–1.2)
Total Protein: 6.9 g/dL (ref 6.0–8.3)

## 2014-05-31 ENCOUNTER — Other Ambulatory Visit: Payer: Self-pay | Admitting: *Deleted

## 2014-05-31 DIAGNOSIS — E785 Hyperlipidemia, unspecified: Secondary | ICD-10-CM

## 2014-06-09 ENCOUNTER — Other Ambulatory Visit: Payer: Self-pay | Admitting: Physician Assistant

## 2014-06-22 ENCOUNTER — Other Ambulatory Visit (INDEPENDENT_AMBULATORY_CARE_PROVIDER_SITE_OTHER): Payer: Medicare Other | Admitting: *Deleted

## 2014-06-22 DIAGNOSIS — E785 Hyperlipidemia, unspecified: Secondary | ICD-10-CM

## 2014-06-22 LAB — HEPATIC FUNCTION PANEL
ALBUMIN: 3.8 g/dL (ref 3.5–5.2)
ALT: 30 U/L (ref 0–35)
AST: 31 U/L (ref 0–37)
Alkaline Phosphatase: 89 U/L (ref 39–117)
Bilirubin, Direct: 0 mg/dL (ref 0.0–0.3)
TOTAL PROTEIN: 6.9 g/dL (ref 6.0–8.3)
Total Bilirubin: 1.2 mg/dL (ref 0.2–1.2)

## 2014-06-30 ENCOUNTER — Encounter (HOSPITAL_COMMUNITY): Payer: Self-pay | Admitting: Cardiology

## 2014-07-01 ENCOUNTER — Encounter (HOSPITAL_COMMUNITY): Payer: Self-pay | Admitting: Internal Medicine

## 2014-08-20 ENCOUNTER — Other Ambulatory Visit: Payer: Self-pay

## 2014-08-20 ENCOUNTER — Other Ambulatory Visit: Payer: Self-pay | Admitting: Cardiology

## 2014-09-02 ENCOUNTER — Ambulatory Visit: Payer: Medicare Other | Admitting: Cardiology

## 2014-09-02 ENCOUNTER — Encounter: Payer: Self-pay | Admitting: Cardiology

## 2014-09-02 ENCOUNTER — Ambulatory Visit (INDEPENDENT_AMBULATORY_CARE_PROVIDER_SITE_OTHER): Payer: Medicare Other | Admitting: Cardiology

## 2014-09-02 VITALS — BP 124/70 | HR 73 | Ht 64.0 in | Wt 246.0 lb

## 2014-09-02 DIAGNOSIS — E785 Hyperlipidemia, unspecified: Secondary | ICD-10-CM

## 2014-09-02 DIAGNOSIS — I2510 Atherosclerotic heart disease of native coronary artery without angina pectoris: Secondary | ICD-10-CM

## 2014-09-02 DIAGNOSIS — I10 Essential (primary) hypertension: Secondary | ICD-10-CM

## 2014-09-02 DIAGNOSIS — R0602 Shortness of breath: Secondary | ICD-10-CM

## 2014-09-02 LAB — BASIC METABOLIC PANEL
BUN: 16 mg/dL (ref 6–23)
CHLORIDE: 105 meq/L (ref 96–112)
CO2: 28 meq/L (ref 19–32)
Calcium: 9.5 mg/dL (ref 8.4–10.5)
Creatinine, Ser: 0.81 mg/dL (ref 0.40–1.20)
GFR: 74.46 mL/min (ref >60.00)
GLUCOSE: 122 mg/dL — AB (ref 70–99)
Potassium: 3.9 mEq/L (ref 3.5–5.1)
Sodium: 139 mEq/L (ref 135–145)

## 2014-09-02 LAB — LIPID PANEL
Cholesterol: 157 mg/dL (ref 0–200)
HDL: 52 mg/dL (ref >39.00)
LDL Cholesterol: 86 mg/dL (ref 0–99)
NonHDL: 105
Total CHOL/HDL Ratio: 3
Triglycerides: 97 mg/dL (ref 0.0–149.0)
VLDL: 19.4 mg/dL (ref 0.0–40.0)

## 2014-09-02 NOTE — Patient Instructions (Signed)
Your physician recommends that you have a lipid profile/BMET.  Your physician wants you to follow-up in: 1 year with Dr Aundra Dubin. (February 2017).  You will receive a reminder letter in the mail two months in advance. If you don't receive a letter, please call our office to schedule the follow-up appointment.

## 2014-09-03 NOTE — Progress Notes (Signed)
Patient ID: Sara [name], female   DOB: Oct 12, 1945, 69 y.o.   MRN: [id number] PCP: Dr. Melina Copa  69 yo with history of CAD and AVNRT s/p ablation presents for cardiology followup.  She had PCI to the LAD in 10/10.  In 12/12, she was admitted to [facility] with SVT and associated chest pain and elevated troponin.  Cath in 12/12 showed patent LAD stents.  She had AVNRT ablation 3/12.  She had Adenosine Cardiolite in 5/14 with no evidence for ischemia or infarction.    She was seen a couple of times recently by Richardson Dopp for exertional dyspnea.  She had the Cardiolite as described above in 5/14.  She had an echo in 12/14 with normal EF and no significant abnormalities, and she had PFTs in 12/14 with normal volumes and spirometry and mildly abnormal DLCO.  She continued to have rather marked dyspnea with exertion.  Given unexplained exertional symptoms, I took her for left and right heart catheterization in 1/15.  This showed normal filling pressures, no pulmonary hypertension, and no obstructive CAD.    Since last appointment, she has lost about 7 lbs.  She has been going to the [facility] 3 times a week.  Her knees hurt with prolonged walking so she is a bit limited in terms of exercise.  However, overall she says that her breathing has improved as her weight has gone down.  She is still short of breath with moderate exertion.  She was, however, able to participate in a Zumba class recently.    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 Obesity 3. HTN 4. GERD 5. OSA: Mild, not on CPAP 6. Psoriasis 7. CAD: Xience DES x 3 to LAD in 10/10.  Myoview  8/11 with EF 67%, no ischemia.  Echo (11/12): EF 55-60%, mild LVH, moderate LAE.  LHC (12/12): Patent LAD stents.  Adenosine Cardiolite (5/14) with EF 68%, no ischemia/infarction.  Echo (11/14) with EF 55-60%, mild LVH, grade I diastolic dysfunction.  LHC (1/15) with patent stents in the proximal LAD and 40% stenosis between the stents, EF 55-60%.  8. Diverticulosis 9. Chronic LBBB 10. Dyspnea: PFTs (12/14) with normal lung volumes and spirometry, mildly decreased DLCO. RHC (1/15) with mean RA 7, PA 27/12, mean PCWP 9, CI 3.35.   FH: CHF  SH: Married, lives in [place].  3 children.  Nonsmoker.  Retired.    Current Outpatient Prescriptions  Medication Sig Dispense Refill  . aspirin EC 81 MG tablet Take 81 mg by mouth daily.    Marland Kitchen atorvastatin (LIPITOR) 80 MG tablet Take 1 tablet (80 mg total) by mouth daily. 30 tablet 3  . CALCIUM & MAGNESIUM CARBONATES PO Take 1 tablet by mouth daily.    . Calcium Carbonate-Vitamin D (SUPER CALCIUM 600 + D3 PO) Take 1 tablet by mouth daily.    Marland Kitchen diltiazem (CARDIZEM CD) 120 MG 24 hr capsule TAKE (1) CAPSULE DAILY 30 capsule 3  . fluconazole (DIFLUCAN) 100 MG tablet Take 100 mg by mouth once as needed (rash).     . furosemide (LASIX) 20 MG tablet TAKE 1  TABLET DAILY 30 tablet 6  . HYDROcodone-acetaminophen (NORCO/VICODIN) 5-325 MG per tablet Take 1 tablet by mouth every 6 (six) hours as needed for moderate pain.    Marland Kitchen losartan (COZAAR) 50 MG tablet Take 1 tablet (50 mg total) by mouth daily. 30 tablet 5  . Magnesium 250 MG TABS Take 250 mg by mouth daily.    . meclizine (ANTIVERT) 25 MG tablet Take 25 mg by mouth 2 (two) times daily as needed for dizziness.     . methocarbamol (ROBAXIN) 500 MG tablet Take 500 mg by mouth every 6 (six) hours as needed for muscle spasms.    . Multiple Vitamins-Minerals (ZINC PO) Take 1 tablet by mouth daily.    . nitroGLYCERIN (NITROSTAT) 0.4 MG SL tablet Place 0.4 mg under the tongue every 5 (five) minutes as needed. For chest  pain    . Omega-3 Fatty Acids (FISH OIL) 1000 MG CAPS Take 1 capsule by mouth 2 (two) times daily.    Marland Kitchen omeprazole (PRILOSEC) 20 MG capsule Take 20 mg by mouth daily.     Marland Kitchen OVER THE COUNTER MEDICATION Apply 1 application topically at bedtime. To hands and feet for psorasis    . potassium chloride (K-DUR,KLOR-CON) 10 MEQ tablet Take 10 mEq by mouth daily.    . sodium chloride (OCEAN) 0.65 % nasal spray Place 1 spray into the nose at bedtime.     Marland Kitchen spironolactone (ALDACTONE) 25 MG tablet Take 12.5 mg by mouth 2 (two) times daily.      No current facility-administered medications for this visit.    BP 124/70 mmHg  Pulse 73  Ht 5\' 4"  (1.626 m)  Wt 246 lb (111.585 kg)  BMI 42.21 kg/m2 General: NAD, obese.  Neck: No JVD, no thyromegaly or thyroid nodule.  Lungs: Clear to auscultation bilaterally with normal respiratory effort. CV: Nondisplaced PMI.  Heart regular S1/S2, no S3/S4, no murmur.  No peripheral edema.  No carotid bruit.  Normal pedal pulses.  Abdomen: Soft, nontender, no hepatosplenomegaly, no distention.  Neurologic: Alert and oriented x 3.  Psych: Normal affect. Extremities: No clubbing or cyanosis. Right groin cath site benign.   Assessment/Plan:  AVNRT  Status post ablation in 3/13. No recent palpitations, continue diltiazem CD. Exertional Dyspnea I suspect that this has been primarily due to obesity and deconditioning.  It has improved with weight loss. Echo unremarkable, PFTs with normal spirometry and lung volumes but mildly decreased DLCO.  She does not appear volume overloaded on exam.  LHC/RHC showed no angiographic CAD and normal right and left hear filling pressures as well as normal PA pressure.   CAD  Patent stents without obstructive disease.  Continue ASA 81, ACEI, statin.  HYPERTENSION  BP is under control. She is on spironolactone so will need to follow K carefully. BMET today.  HYPERLIPIDEMIA  Check lipids today.   Followup in 1year.     Loralie Champagne 09/03/2014

## 2014-09-20 ENCOUNTER — Other Ambulatory Visit: Payer: Self-pay | Admitting: Cardiology

## 2014-10-20 ENCOUNTER — Other Ambulatory Visit: Payer: Self-pay | Admitting: Cardiovascular Disease

## 2014-10-29 ENCOUNTER — Other Ambulatory Visit: Payer: Self-pay

## 2014-10-29 DIAGNOSIS — Z1231 Encounter for screening mammogram for malignant neoplasm of breast: Secondary | ICD-10-CM

## 2014-12-10 ENCOUNTER — Telehealth: Payer: Self-pay | Admitting: Family Medicine

## 2014-12-10 NOTE — Telephone Encounter (Signed)
Stp she needs to establish care, her insurance is [facility], pt was advised to arrive 30 minutes prior with a copy of her insurance card and a valid photo id. Pt doesn't take any pain meds, has had a back surgery and knee surgeries as well as 2 heart attacks. Pt given new pt appt with Evelina Dun 6/30 at 9:10.

## 2014-12-14 ENCOUNTER — Ambulatory Visit
Admission: RE | Admit: 2014-12-14 | Discharge: 2014-12-14 | Disposition: A | Discharge status: Home / Self Care | Payer: Medicare Other | Source: Ambulatory Visit

## 2014-12-14 ENCOUNTER — Ambulatory Visit: Payer: Medicare Other

## 2014-12-14 DIAGNOSIS — Z1231 Encounter for screening mammogram for malignant neoplasm of breast: Secondary | ICD-10-CM

## 2014-12-31 ENCOUNTER — Other Ambulatory Visit: Payer: Self-pay | Admitting: Cardiology

## 2015-01-17 ENCOUNTER — Encounter: Payer: Self-pay | Admitting: Internal Medicine

## 2015-01-20 ENCOUNTER — Ambulatory Visit (INDEPENDENT_AMBULATORY_CARE_PROVIDER_SITE_OTHER): Payer: Medicare Other | Admitting: Family

## 2015-01-20 ENCOUNTER — Encounter: Payer: Self-pay | Admitting: Family

## 2015-01-20 ENCOUNTER — Ambulatory Visit: Payer: Medicare Other

## 2015-01-20 VITALS — BP 119/85 | HR 76 | Temp 97.0°F | Ht 64.0 in | Wt 253.8 lb

## 2015-01-20 DIAGNOSIS — I10 Essential (primary) hypertension: Secondary | ICD-10-CM

## 2015-01-20 DIAGNOSIS — Z780 Asymptomatic menopausal state: Secondary | ICD-10-CM | POA: Diagnosis not present

## 2015-01-20 DIAGNOSIS — I2510 Atherosclerotic heart disease of native coronary artery without angina pectoris: Secondary | ICD-10-CM

## 2015-01-20 DIAGNOSIS — M1990 Unspecified osteoarthritis, unspecified site: Secondary | ICD-10-CM | POA: Diagnosis not present

## 2015-01-20 DIAGNOSIS — R609 Edema, unspecified: Secondary | ICD-10-CM

## 2015-01-20 DIAGNOSIS — E876 Hypokalemia: Secondary | ICD-10-CM | POA: Insufficient documentation

## 2015-01-20 DIAGNOSIS — E785 Hyperlipidemia, unspecified: Secondary | ICD-10-CM

## 2015-01-20 DIAGNOSIS — K219 Gastro-esophageal reflux disease without esophagitis: Secondary | ICD-10-CM | POA: Diagnosis not present

## 2015-01-20 MED ORDER — FUROSEMIDE 20 MG PO TABS: 20.0000 mg | ORAL_TABLET | Freq: Every day | ORAL | Status: DC

## 2015-01-20 MED ORDER — LOSARTAN POTASSIUM 50 MG PO TABS: 50.0000 mg | ORAL_TABLET | Freq: Every day | ORAL | Status: DC

## 2015-01-20 MED ORDER — OMEPRAZOLE 20 MG PO CPDR: 20.0000 mg | DELAYED_RELEASE_CAPSULE | Freq: Every day | ORAL | Status: DC

## 2015-01-20 MED ORDER — SPIRONOLACTONE 25 MG PO TABS: 12.5000 mg | ORAL_TABLET | Freq: Two times a day (BID) | ORAL | Status: DC

## 2015-01-20 MED ORDER — ATORVASTATIN CALCIUM 80 MG PO TABS: 80.0000 mg | ORAL_TABLET | Freq: Every day | ORAL | Status: DC

## 2015-01-20 NOTE — Patient Instructions (Signed)

## 2015-01-20 NOTE — Progress Notes (Signed)
Subjective:    Patient ID: Sara [name], female    DOB: 1946/03/19, 69 y.o.   MRN: [id number]  Pt presents to the office today to establish care. Pt has of two MI in the past (2013 & 2014). Pt is followed by cardiologists every 6 months.  Hypertension This is a chronic problem. The current episode started more than 1 year ago. The problem has been resolved since onset. The problem is controlled. Associated symptoms include shortness of breath ("when I walk up stairs or long distances"). Pertinent negatives include no anxiety, headaches, palpitations or peripheral edema. Risk factors for coronary artery disease include dyslipidemia, obesity, post-menopausal state, sedentary lifestyle and family history. Past treatments include angiotensin blockers and diuretics. The current treatment provides moderate improvement. Hypertensive end-organ damage includes CAD/MI. There is no history of kidney disease, CVA, heart failure or a thyroid problem. Identifiable causes of hypertension include sleep apnea.  Hyperlipidemia This is a chronic problem. The current episode started more than 1 year ago. The problem is controlled. Recent lipid tests were reviewed and are normal. She has no history of diabetes or hypothyroidism. Factors aggravating her hyperlipidemia include fatty foods. Associated symptoms include shortness of breath ("when I walk up stairs or long distances"). Pertinent negatives include no leg pain. Current antihyperlipidemic treatment includes statins. The current treatment provides significant improvement of lipids. Risk factors for coronary artery disease include dyslipidemia, family history, hypertension, obesity, post-menopausal and a sedentary lifestyle.  Gastrophageal Reflux She reports no belching, no coughing or no heartburn. This is a chronic problem. The current episode started more than 1 year ago. The problem occurs occasionally. The symptoms are aggravated by certain foods and lying down.  Risk factors include obesity. She has tried a PPI for the symptoms. The treatment provided significant relief.      Review of Systems  Constitutional: Negative.   HENT: Negative.   Eyes: Negative.   Respiratory: Positive for shortness of breath ("when I walk up stairs or long distances"). Negative for cough.   Cardiovascular: Negative.  Negative for palpitations.  Gastrointestinal: Negative.  Negative for heartburn.  Endocrine: Negative.   Genitourinary: Negative.   Musculoskeletal: Negative.   Neurological: Negative.  Negative for headaches.  Hematological: Negative.   Psychiatric/Behavioral: Negative.   All other systems reviewed and are negative.      Objective:   Physical Exam  Constitutional: She is oriented to person, place, and time. She appears well-developed and well-nourished. No distress.  HENT:  Head: Normocephalic and atraumatic.  Right Ear: External ear normal.  Left Ear: External ear normal.  Nose: Nose normal.  Mouth/Throat: Oropharynx is clear and moist.  Eyes: Pupils are equal, round, and reactive to light.  Neck: Normal range of motion. Neck supple. No thyromegaly present.  Cardiovascular: Normal rate, regular rhythm, normal heart sounds and intact distal pulses.   No murmur heard. Pulmonary/Chest: Effort normal and breath sounds normal. No respiratory distress. She has no wheezes.  Abdominal: Soft. Bowel sounds are normal. She exhibits no distension. There is no tenderness.  Musculoskeletal: Normal range of motion. She exhibits no edema or tenderness.  Neurological: She is alert and oriented to person, place, and time. She has normal reflexes. No cranial nerve deficit.  Skin: Skin is warm and dry.  Psychiatric: She has a normal mood and affect. Her behavior is normal. Judgment and thought content normal.  Vitals reviewed.     BP 119/85 mmHg  Pulse 76  Temp(Src) 97 F (36.1 C) (Oral)  Ht  5' 4" (1.626 m)  Wt 253 lb 12.8 oz (115.123 kg)  BMI 43.54  kg/m2     Assessment & Plan:  1. Atherosclerosis of native coronary artery without angina pectoris, unspecified whether native or transplanted heart - CMP14+EGFR  2. Gastroesophageal reflux disease, esophagitis presence not specified - CMP14+EGFR - omeprazole (PRILOSEC) 20 MG capsule; Take 1 capsule (20 mg total) by mouth daily.  Dispense: 90 capsule; Refill: 3  3. Essential hypertension - CMP14+EGFR - furosemide (LASIX) 20 MG tablet; Take 1 tablet (20 mg total) by mouth daily.  Dispense: 90 tablet; Refill: 3 - losartan (COZAAR) 50 MG tablet; Take 1 tablet (50 mg total) by mouth daily.  Dispense: 90 tablet; Refill: 3 - spironolactone (ALDACTONE) 25 MG tablet; Take 0.5 tablets (12.5 mg total) by mouth 2 (two) times daily.  Dispense: 90 tablet; Refill: 3  4. Hyperlipidemia - CMP14+EGFR - Lipid panel - atorvastatin (LIPITOR) 80 MG tablet; Take 1 tablet (80 mg total) by mouth daily.  Dispense: 90 tablet; Refill: 2  5. Arthritis - CMP14+EGFR  6. Hypokalemia - CMP14+EGFR  7. Edema - CMP14+EGFR - furosemide (LASIX) 20 MG tablet; Take 1 tablet (20 mg total) by mouth daily.  Dispense: 90 tablet; Refill: 3  8. Post-menopausal - DG Bone Density; Future   Continue all meds Labs pending Health Maintenance reviewed Diet and exercise encouraged RTO 6 months  Evelina Dun, FNP

## 2015-01-21 LAB — CMP14+EGFR
ALK PHOS: 89 IU/L (ref 39–117)
ALT: 15 IU/L (ref 0–32)
AST: 23 IU/L (ref 0–40)
Albumin/Globulin Ratio: 1.5 (ref 1.1–2.5)
Albumin: 3.8 g/dL (ref 3.6–4.8)
BUN / CREAT RATIO: 13 (ref 11–26)
BUN: 11 mg/dL (ref 8–27)
Bilirubin Total: 0.7 mg/dL (ref 0.0–1.2)
CHLORIDE: 105 mmol/L (ref 97–108)
CO2: 23 mmol/L (ref 18–29)
Calcium: 9.3 mg/dL (ref 8.7–10.3)
Creatinine, Ser: 0.82 mg/dL (ref 0.57–1.00)
GFR calc non Af Amer: 73 mL/min/{1.73_m2} (ref >59)
GFR, EST AFRICAN AMERICAN: 84 mL/min/{1.73_m2} (ref >59)
Globulin, Total: 2.6 g/dL (ref 1.5–4.5)
Glucose: 92 mg/dL (ref 65–99)
Potassium: 4.5 mmol/L (ref 3.5–5.2)
Sodium: 146 mmol/L — ABNORMAL HIGH (ref 134–144)
TOTAL PROTEIN: 6.4 g/dL (ref 6.0–8.5)

## 2015-01-21 LAB — LIPID PANEL
CHOLESTEROL TOTAL: 170 mg/dL (ref 100–199)
Chol/HDL Ratio: 3.1 ratio units (ref 0.0–4.4)
HDL: 54 mg/dL (ref >39)
LDL CALC: 96 mg/dL (ref 0–99)
TRIGLYCERIDES: 100 mg/dL (ref 0–149)
VLDL CHOLESTEROL CAL: 20 mg/dL (ref 5–40)

## 2015-01-26 ENCOUNTER — Telehealth: Payer: Self-pay | Admitting: Family

## 2015-01-26 NOTE — Telephone Encounter (Signed)
This is noted in chart and verified in looking at [facility]

## 2015-02-01 ENCOUNTER — Encounter: Payer: Self-pay | Admitting: Family

## 2015-02-01 ENCOUNTER — Ambulatory Visit (INDEPENDENT_AMBULATORY_CARE_PROVIDER_SITE_OTHER): Payer: Medicare Other | Admitting: Family

## 2015-02-01 VITALS — BP 120/78 | HR 83 | Temp 97.1°F | Ht 64.0 in | Wt 254.0 lb

## 2015-02-01 DIAGNOSIS — M7989 Other specified soft tissue disorders: Secondary | ICD-10-CM | POA: Diagnosis not present

## 2015-02-01 DIAGNOSIS — R238 Other skin changes: Secondary | ICD-10-CM | POA: Diagnosis not present

## 2015-02-01 NOTE — Addendum Note (Signed)
Addended by: Wyline Mood on: 02/01/2015 06:21 PM   Modules accepted: Orders

## 2015-02-01 NOTE — Patient Instructions (Signed)

## 2015-02-01 NOTE — Progress Notes (Signed)
   Subjective:    Patient ID: Sara [name], female    DOB: 01-Nov-1945, 69 y.o.   MRN: [id number]  Leg Pain   Rash This is a new problem. The current episode started in the past 7 days (Sunday). The problem is unchanged. Location: right inner thigh. The rash is characterized by redness, swelling and pain. It is unknown if there was an exposure to a precipitant. Pertinent negatives include no congestion, cough, diarrhea, joint pain, shortness of breath or sore throat. Past treatments include nothing. The treatment provided no relief.      Review of Systems  Constitutional: Negative.   HENT: Negative.  Negative for congestion and sore throat.   Eyes: Negative.   Respiratory: Negative.  Negative for cough and shortness of breath.   Cardiovascular: Negative.  Negative for palpitations.  Gastrointestinal: Negative.  Negative for diarrhea.  Endocrine: Negative.   Genitourinary: Negative.   Musculoskeletal: Negative.  Negative for joint pain.  Skin: Positive for rash.  Neurological: Negative.  Negative for headaches.  Hematological: Negative.   Psychiatric/Behavioral: Negative.   All other systems reviewed and are negative.      Objective:   Physical Exam  Constitutional: She is oriented to person, place, and time. She appears well-developed and well-nourished. No distress.  HENT:  Head: Normocephalic and atraumatic.  Eyes: Pupils are equal, round, and reactive to light.  Neck: Normal range of motion. Neck supple. No thyromegaly present.  Cardiovascular: Normal rate, regular rhythm, normal heart sounds and intact distal pulses.   No murmur heard. Pulmonary/Chest: Effort normal and breath sounds normal. No respiratory distress. She has no wheezes.  Abdominal: Soft. Bowel sounds are normal. She exhibits no distension. There is no tenderness.  Musculoskeletal: Normal range of motion. She exhibits no edema or tenderness.  Neurological: She is alert and oriented to person, place, and  time. She has normal reflexes. No cranial nerve deficit.  Skin: Skin is warm and dry. There is erythema.  Right upper thigh erythemas, warm to touch, and there is a "painful knot" when palpating- No bite marks noted  Psychiatric: She has a normal mood and affect. Her behavior is normal. Judgment and thought content normal.  Vitals reviewed.   BP 120/78 mmHg  Pulse 83  Temp(Src) 97.1 F (36.2 C) (Oral)  Ht 5\' 4"  (1.626 m)  Wt 254 lb (115.214 kg)  BMI 43.58 kg/m2       Assessment & Plan:  1. Leg swelling - Ultrasound doppler venous legs bilat; Future - D-dimer, quantitative (not at [facility]) - POCT CBC  2. Localized warmth of skin - Ultrasound doppler venous legs bilat; Future - D-dimer, quantitative (not at [facility]) - POCT CBC  Rest leg until cleared by doppler Continue daily aspirin Labs pending Will call pt with doppler results RTO as needed  Evelina Dun, FNP

## 2015-02-01 NOTE — Addendum Note (Signed)
Addended by: Wyline Mood on: 02/01/2015 06:29 PM   Modules accepted: Orders

## 2015-02-01 NOTE — Addendum Note (Signed)
Addended by: Pollyann Kennedy F on: 02/01/2015 06:20 PM   Modules accepted: Orders

## 2015-02-02 ENCOUNTER — Telehealth: Payer: Self-pay | Admitting: Family

## 2015-02-02 DIAGNOSIS — M79605 Pain in left leg: Secondary | ICD-10-CM | POA: Diagnosis not present

## 2015-02-02 DIAGNOSIS — R600 Localized edema: Secondary | ICD-10-CM | POA: Diagnosis not present

## 2015-02-02 DIAGNOSIS — M79604 Pain in right leg: Secondary | ICD-10-CM | POA: Diagnosis not present

## 2015-02-02 LAB — D-DIMER, QUANTITATIVE (NOT AT ARMC)

## 2015-02-02 NOTE — Addendum Note (Signed)
Addended by: Thana Ates on: 02/02/2015 10:40 AM   Modules accepted: Orders

## 2015-02-03 DIAGNOSIS — M7989 Other specified soft tissue disorders: Secondary | ICD-10-CM | POA: Diagnosis not present

## 2015-02-03 DIAGNOSIS — R238 Other skin changes: Secondary | ICD-10-CM | POA: Diagnosis not present

## 2015-02-03 LAB — POCT CBC
Granulocyte percent: 63.3 %G (ref 37–80)
HEMATOCRIT: 43.5 % (ref 37.7–47.9)
Hemoglobin: 14.2 g/dL (ref 12.2–16.2)
Lymph, poc: 2.1 (ref 0.6–3.4)
MCH, POC: 30 pg (ref 27–31.2)
MCHC: 32.7 g/dL (ref 31.8–35.4)
MCV: 91.8 fL (ref 80–97)
MPV: 8.9 fL (ref 0–99.8)
POC GRANULOCYTE: 4.3 (ref 2–6.9)
POC LYMPH %: 30.5 % (ref 10–50)
Platelet Count, POC: 163 10*3/uL (ref 142–424)
RBC: 4.74 M/uL (ref 4.04–5.48)
RDW, POC: 13.2 %
WBC: 6.8 10*3/uL (ref 4.6–10.2)

## 2015-02-03 NOTE — Addendum Note (Signed)
Addended by: Pollyann Kennedy F on: 02/03/2015 09:40 AM   Modules accepted: Orders

## 2015-02-04 ENCOUNTER — Telehealth: Payer: Self-pay | Admitting: Family

## 2015-02-04 LAB — D-DIMER, QUANTITATIVE: D-DIMER: 0.61 mg/L FEU — ABNORMAL HIGH (ref 0.00–0.49)

## 2015-02-04 NOTE — Telephone Encounter (Signed)
Pt notified of results Verbalizes understanding 

## 2015-02-07 ENCOUNTER — Other Ambulatory Visit: Payer: Self-pay | Admitting: Cardiovascular Disease

## 2015-04-21 NOTE — Telephone Encounter (Signed)
Pneumonia shot noted in the chart

## 2015-05-24 ENCOUNTER — Other Ambulatory Visit: Payer: Medicare Other

## 2015-07-04 ENCOUNTER — Encounter: Payer: Self-pay | Admitting: Family Medicine

## 2015-07-04 ENCOUNTER — Ambulatory Visit (INDEPENDENT_AMBULATORY_CARE_PROVIDER_SITE_OTHER): Payer: Medicare Other | Admitting: Family Medicine

## 2015-07-04 VITALS — BP 136/75 | HR 84 | Temp 98.1°F | Ht 64.0 in | Wt 255.2 lb

## 2015-07-04 DIAGNOSIS — J019 Acute sinusitis, unspecified: Secondary | ICD-10-CM | POA: Diagnosis not present

## 2015-07-04 MED ORDER — FLUTICASONE PROPIONATE 50 MCG/ACT NA SUSP: 1.0000 | Freq: Two times a day (BID) | NASAL | Status: DC | PRN

## 2015-07-04 MED ORDER — AZITHROMYCIN 250 MG PO TABS
ORAL_TABLET | ORAL | Status: DC
Start: 2015-07-04 — End: 2015-08-29

## 2015-07-04 NOTE — Progress Notes (Signed)
BP 136/75 mmHg  Pulse 84  Temp(Src) 98.1 F (36.7 C) (Oral)  Ht 5\' 4"  (1.626 m)  Wt 255 lb 3.2 oz (115.758 kg)  BMI 43.78 kg/m2  SpO2 96%   Subjective:    Patient ID: Sara [name], female    DOB: 11/05/1945, 69 y.o.   MRN: [id number]  HPI: Sara [name] is a 69 y.o. female presenting on 07/04/2015 for Cough; Sinusitis; and Bronchitis   HPI Cough sinusitis and bronchitis Patient has had a cough and sinus pressure and chest congestion and postnasal drainage that is been going on for the past week. She has tried over-the-counter Tylenol Sinus and cold. She denies any fevers or chills. She has a cough but it is nonproductive. Her nasal drainage is yellow green. She has sinus pressure under her eyes. She does not know of any sick contacts. And she feels like this is worsening over the past week  Relevant past medical, surgical, family and social history reviewed and updated as indicated. Interim medical history since our last visit reviewed. Allergies and medications reviewed and updated.  Review of Systems  Constitutional: Negative for fever and chills.  HENT: Positive for congestion, postnasal drip, rhinorrhea, sinus pressure and sore throat. Negative for ear discharge, ear pain and sneezing.   Eyes: Negative for pain, redness and visual disturbance.  Respiratory: Positive for cough. Negative for chest tightness and shortness of breath.   Cardiovascular: Negative for chest pain and leg swelling.  Genitourinary: Negative for dysuria and difficulty urinating.  Musculoskeletal: Negative for back pain and gait problem.  Skin: Negative for rash.  Neurological: Negative for dizziness, light-headedness and headaches.  Psychiatric/Behavioral: Negative for behavioral problems and agitation.  All other systems reviewed and are negative.   Per HPI unless specifically indicated above     Medication List       This list is accurate as of: 07/04/15  4:50 PM.  Always use your most  recent med list.               aspirin EC 81 MG tablet  Take 81 mg by mouth daily.     atorvastatin 80 MG tablet  Commonly known as:  LIPITOR  Take 1 tablet (80 mg total) by mouth daily.     azithromycin 250 MG tablet  Commonly known as:  ZITHROMAX  Take 2 the first day and then one each day after.     diltiazem 120 MG 24 hr capsule  Commonly known as:  CARDIZEM CD  TAKE (1) CAPSULE DAILY     fluticasone 50 MCG/ACT nasal spray  Commonly known as:  FLONASE  Place 1 spray into both nostrils 2 (two) times daily as needed for allergies or rhinitis.     furosemide 20 MG tablet  Commonly known as:  LASIX  Take 1 tablet (20 mg total) by mouth daily.     HYDROcodone-acetaminophen 5-325 MG tablet  Commonly known as:  NORCO/VICODIN  Take 1 tablet by mouth every 6 (six) hours as needed for moderate pain.     losartan 50 MG tablet  Commonly known as:  COZAAR  Take 1 tablet (50 mg total) by mouth daily.     meclizine 25 MG tablet  Commonly known as:  ANTIVERT  Take 25 mg by mouth 2 (two) times daily as needed for dizziness.     methocarbamol 500 MG tablet  Commonly known as:  ROBAXIN  Take 500 mg by mouth every 6 (six) hours as needed for  muscle spasms.     nitroGLYCERIN 0.4 MG SL tablet  Commonly known as:  NITROSTAT  Place 0.4 mg under the tongue every 5 (five) minutes as needed. For chest pain     omeprazole 20 MG capsule  Commonly known as:  PRILOSEC  Take 1 capsule (20 mg total) by mouth daily.     potassium chloride 10 MEQ tablet  Commonly known as:  K-DUR  TAKE 1 TABLET DAILY     potassium chloride 10 MEQ tablet  Commonly known as:  K-DUR,KLOR-CON  Take 10 mEq by mouth daily.     sodium chloride 0.65 % nasal spray  Commonly known as:  OCEAN  Place 1 spray into the nose at bedtime.     spironolactone 25 MG tablet  Commonly known as:  ALDACTONE  Take 0.5 tablets (12.5 mg total) by mouth 2 (two) times daily.     SUPER CALCIUM 600 + D3 PO  Take 1 tablet by  mouth daily.     ZINC PO  Take 1 tablet by mouth daily.           Objective:    BP 136/75 mmHg  Pulse 84  Temp(Src) 98.1 F (36.7 C) (Oral)  Ht 5\' 4"  (1.626 m)  Wt 255 lb 3.2 oz (115.758 kg)  BMI 43.78 kg/m2  SpO2 96%  Wt Readings from Last 3 Encounters:  07/04/15 255 lb 3.2 oz (115.758 kg)  02/01/15 254 lb (115.214 kg)  01/20/15 253 lb 12.8 oz (115.123 kg)    Physical Exam  Constitutional: She is oriented to person, place, and time. She appears well-developed and well-nourished. No distress.  HENT:  Right Ear: Tympanic membrane, external ear and ear canal normal.  Left Ear: Tympanic membrane, external ear and ear canal normal.  Nose: Mucosal edema and rhinorrhea present. No epistaxis. Right sinus exhibits no maxillary sinus tenderness and no frontal sinus tenderness. Left sinus exhibits no maxillary sinus tenderness and no frontal sinus tenderness.  Mouth/Throat: Uvula is midline and mucous membranes are normal. Posterior oropharyngeal edema and posterior oropharyngeal erythema present. No oropharyngeal exudate or tonsillar abscesses.  Eyes: Conjunctivae and EOM are normal.  Neck: Neck supple. No thyromegaly present.  Cardiovascular: Normal rate, regular rhythm, normal heart sounds and intact distal pulses.   No murmur heard. Pulmonary/Chest: Effort normal and breath sounds normal. No respiratory distress. She has no wheezes.  Musculoskeletal: Normal range of motion. She exhibits no edema or tenderness.  Lymphadenopathy:    She has no cervical adenopathy.  Neurological: She is alert and oriented to person, place, and time. Coordination normal.  Skin: Skin is warm and dry. No rash noted. She is not diaphoretic.  Psychiatric: She has a normal mood and affect. Her behavior is normal.  Vitals reviewed.   Results for orders placed or performed in visit on 02/01/15  D-dimer, quantitative (not at [facility])  Result Value Ref Range   D-DIMER CANCELED mg/L FEU  D-dimer,  quantitative (not at [facility])  Result Value Ref Range   D-DIMER 0.61 (H) 0.00 - 0.49 mg/L FEU  POCT CBC  Result Value Ref Range   WBC 6.8 4.6 - 10.2 K/uL   Lymph, poc 2.1 0.6 - 3.4   POC LYMPH PERCENT 30.5 10 - 50 %L   POC Granulocyte 4.3 2 - 6.9   Granulocyte percent 63.3 37 - 80 %G   RBC 4.74 4.04 - 5.48 M/uL   Hemoglobin 14.2 12.2 - 16.2 g/dL   HCT, POC 43.5 37.7 - 47.9 %  MCV 91.8 80 - 97 fL   MCH, POC 30.0 27 - 31.2 pg   MCHC 32.7 31.8 - 35.4 g/dL   RDW, POC 13.2 %   Platelet Count, POC 163 142 - 424 K/uL   MPV 8.9 0 - 99.8 fL      Assessment & Plan:       Problem List Items Addressed This Visit    None    Visit Diagnoses    Acute rhinosinusitis    -  Primary    She will try Flonase and an antihistamine and if not improved after a couple days will pick up azithromycin    Relevant Medications    fluticasone (FLONASE) 50 MCG/ACT nasal spray    azithromycin (ZITHROMAX) 250 MG tablet        Follow up plan: Return if symptoms worsen or fail to improve.  Counseling provided for all of the vaccine components No orders of the defined types were placed in this encounter.    Caryl Pina, MD [facility] 07/04/2015, 4:50 PM

## 2015-08-06 DIAGNOSIS — M25521 Pain in right elbow: Secondary | ICD-10-CM | POA: Diagnosis not present

## 2015-08-29 ENCOUNTER — Encounter: Payer: Self-pay | Admitting: Family

## 2015-08-29 ENCOUNTER — Ambulatory Visit (INDEPENDENT_AMBULATORY_CARE_PROVIDER_SITE_OTHER): Payer: Medicare Other | Admitting: Family

## 2015-08-29 VITALS — BP 103/66 | HR 79 | Temp 97.7°F | Ht 64.0 in | Wt 260.0 lb

## 2015-08-29 DIAGNOSIS — H6693 Otitis media, unspecified, bilateral: Secondary | ICD-10-CM | POA: Diagnosis not present

## 2015-08-29 DIAGNOSIS — J069 Acute upper respiratory infection, unspecified: Secondary | ICD-10-CM | POA: Diagnosis not present

## 2015-08-29 DIAGNOSIS — R591 Generalized enlarged lymph nodes: Secondary | ICD-10-CM

## 2015-08-29 DIAGNOSIS — R599 Enlarged lymph nodes, unspecified: Secondary | ICD-10-CM

## 2015-08-29 MED ORDER — AMOXICILLIN-POT CLAVULANATE 875-125 MG PO TABS: 1.0000 | ORAL_TABLET | Freq: Two times a day (BID) | ORAL | Status: DC

## 2015-08-29 NOTE — Progress Notes (Signed)
Subjective:    Patient ID: Sara [name], female    DOB: [birth year], 70 y.o.   MRN: [id number]  HPI PT presents to the office today with a painful knot on the back of her neck on the left side. PT states she has had it there for years and has just been told to watch it, but it has just over the last two weeks it has become painful. PT states she pain is constant aching 5-6 out 10. Pt states she has tried heat with no relief. PT denies any warmth, drainage, or fever. PT states she was treated for a URI on 07/04/15 and continues to have a sore throat and a cough, but has improved slightly.    Review of Systems  Constitutional: Negative.   HENT: Negative.   Eyes: Negative.   Respiratory: Negative.  Negative for shortness of breath.   Cardiovascular: Negative.  Negative for palpitations.  Gastrointestinal: Negative.   Endocrine: Negative.   Genitourinary: Negative.   Musculoskeletal: Negative.   Neurological: Negative.  Negative for headaches.  Hematological: Negative.   Psychiatric/Behavioral: Negative.   All other systems reviewed and are negative.      Objective:   Physical Exam  Constitutional: She is oriented to person, place, and time. She appears well-developed and well-nourished. No distress.  HENT:  Head: Normocephalic and atraumatic.  Right Ear: External ear normal. Tympanic membrane is erythematous.  Left Ear: External ear normal. Tympanic membrane is erythematous.  Nasal passage erythemas with mild swelling Oropharynx erythemas     Eyes: Pupils are equal, round, and reactive to light.  Neck: Normal range of motion. Neck supple. No thyromegaly present.  Cardiovascular: Normal rate, regular rhythm, normal heart sounds and intact distal pulses.   No murmur heard. Pulmonary/Chest: Effort normal and breath sounds normal. No respiratory distress. She has no wheezes.  Abdominal: Soft. Bowel sounds are normal. She exhibits no distension. There is no tenderness.    Musculoskeletal: Normal range of motion. She exhibits no edema or tenderness.  Neurological: She is alert and oriented to person, place, and time. She has normal reflexes. No cranial nerve deficit.  Skin: Skin is warm and dry.  Enlarged lymph node behind left ear- 3cmX3cm  Psychiatric: She has a normal mood and affect. Her behavior is normal. Judgment and thought content normal.  Vitals reviewed.   BP 103/66 mmHg  Pulse 79  Temp(Src) 97.7 F (36.5 C) (Oral)  Ht 5\' 4"  (1.626 m)  Wt 260 lb (117.935 kg)  BMI 44.61 kg/m2       Assessment & Plan:  1. Bilateral acute otitis media, recurrence not specified, unspecified otitis media type - amoxicillin-clavulanate (AUGMENTIN) 875-125 MG tablet; Take 1 tablet by mouth 2 (two) times daily.  Dispense: 14 tablet; Refill: 0  2. Acute upper respiratory infection -- Take meds as prescribed - Use a cool mist humidifier  -Use saline nose sprays frequently -Saline irrigations of the nose can be very helpful if done frequently.  * 4X daily for 1 week*  * Use of a nettie pot can be helpful with this. Follow directions with this* -Force fluids -For any cough or congestion  Use plain Mucinex- regular strength or max strength is fine   * Children- consult with Pharmacist for dosing -For fever or aces or pains- take tylenol or ibuprofen appropriate for age and weight.  * for fevers greater than 101 orally you may alternate ibuprofen and tylenol every  3 hours. -Throat lozenges if help -New toothbrush  in 3 days - amoxicillin-clavulanate (AUGMENTIN) 875-125 MG tablet; Take 1 tablet by mouth 2 (two) times daily.  Dispense: 14 tablet; Refill: 0  3. Enlarged lymph node - amoxicillin-clavulanate (AUGMENTIN) 875-125 MG tablet; Take 1 tablet by mouth 2 (two) times daily.  Dispense: 14 tablet; Refill: 0  Will treat Bilateral otitis media and URI to see if nodule resolves. Could be cyst vs lymph node enlargement.  RTO in 2 weeks  Evelina Dun,  FNP

## 2015-08-29 NOTE — Patient Instructions (Signed)

## 2015-08-30 DIAGNOSIS — Z96653 Presence of artificial knee joint, bilateral: Secondary | ICD-10-CM | POA: Diagnosis not present

## 2015-08-30 DIAGNOSIS — Z96651 Presence of right artificial knee joint: Secondary | ICD-10-CM | POA: Diagnosis not present

## 2015-08-30 DIAGNOSIS — Z96652 Presence of left artificial knee joint: Secondary | ICD-10-CM | POA: Diagnosis not present

## 2015-08-30 DIAGNOSIS — Z471 Aftercare following joint replacement surgery: Secondary | ICD-10-CM | POA: Diagnosis not present

## 2015-09-08 ENCOUNTER — Ambulatory Visit (INDEPENDENT_AMBULATORY_CARE_PROVIDER_SITE_OTHER): Payer: Medicare Other | Admitting: Family

## 2015-09-08 ENCOUNTER — Encounter: Payer: Self-pay | Admitting: Family

## 2015-09-08 VITALS — BP 128/83 | HR 89 | Temp 97.1°F | Ht 64.0 in | Wt 263.0 lb

## 2015-09-08 DIAGNOSIS — R05 Cough: Secondary | ICD-10-CM

## 2015-09-08 DIAGNOSIS — L7211 Pilar cyst: Secondary | ICD-10-CM | POA: Diagnosis not present

## 2015-09-08 DIAGNOSIS — R059 Cough, unspecified: Secondary | ICD-10-CM

## 2015-09-08 LAB — POCT INFLUENZA A/B
INFLUENZA A, POC: NEGATIVE
INFLUENZA B, POC: NEGATIVE

## 2015-09-08 MED ORDER — BENZONATATE 200 MG PO CAPS: 200.0000 mg | ORAL_CAPSULE | Freq: Three times a day (TID) | ORAL | Status: DC | PRN

## 2015-09-08 MED ORDER — NAPROXEN 500 MG PO TABS: 500.0000 mg | ORAL_TABLET | Freq: Two times a day (BID) | ORAL | Status: DC

## 2015-09-08 NOTE — Patient Instructions (Addendum)
Sebaceous Cyst Removal Sebaceous cyst removal is a procedure to remove a sac of oily material that forms under your skin (sebaceous cyst). Sebaceous cysts may also be called epidermoid cysts or keratin cysts. Normally, the skin secretes this oily material through a gland or a hair follicle. This type of cyst usually results when a skin gland or hair follicle becomes blocked. You may need this procedure if you have a sebaceous cyst that becomes large, uncomfortable, or infected. LET YOUR HEALTH CARE PROVIDER KNOW ABOUT:  Any allergies you have.  All medicines you are taking, including vitamins, herbs, eye drops, creams, and over-the-counter medicines.  Previous problems you or members of your family have had with the use of anesthetics.  Any blood disorders you have.  Previous surgeries you have had.  Medical conditions you have. RISKS AND COMPLICATIONS Generally, this is a safe procedure. However, problems may occur, including:  Developing another cyst.  Bleeding.  Infection.  Scarring. BEFORE THE PROCEDURE  Ask your health care provider about:  Changing or stopping your regular medicines. This is especially important if you are taking diabetes medicines or blood thinners.  Taking medicines such as aspirin and ibuprofen. These medicines can thin your blood. Do not take these medicines before your procedure if your health care provider instructs you not to.  If you have an infected cyst, you may have to take antibiotic medicines before or after the cyst removal. Take your antibiotics as directed by your health care provider. Finish all of the medicine even if you start to feel better.  Take a shower on the morning of your procedure. Your health care provider may ask you to use a germ-killing (antiseptic) soap. PROCEDURE  You will be given a medicine that numbs the area (local anesthetic).  The skin around the cyst will be cleaned with a germ-killing solution  (antiseptic).  Your health care provider will make a small surgical incision over the cyst.  The cyst will be separated from the surrounding tissues that are under your skin.  If possible, the cyst will be removed undamaged (intact).  If the cyst bursts (ruptures), it will need to be removed in pieces.  After the cyst is removed, your health care provider will control any bleeding and close the incision with small stitches (sutures). Small incisions may not need sutures, and the bleeding will be controlled by applying direct pressure with gauze.  Your health care provider may apply antibiotic ointment and a light bandage (dressing) over the incision. This procedure may vary among health care providers and hospitals. AFTER THE PROCEDURE  If your cyst ruptured during surgery, you may need to take antibiotic medicine. If you were prescribed an antibiotic medicine, finish all of it even if you start to feel better.   This information is not intended to replace advice given to you by your health care provider. Make sure you discuss any questions you have with your health care provider.   Document Released: 07/06/2000 Document Revised: 07/30/2014 Document Reviewed: 03/24/2014 Elsevier Interactive Patient Education 2016 [employer]  

## 2015-09-08 NOTE — Addendum Note (Signed)
Addended by: Evelina Dun A on: 09/08/2015 03:52 PM   Modules accepted: Orders

## 2015-09-08 NOTE — Addendum Note (Signed)
Addended by: Evelina Dun A on: 09/08/2015 03:54 PM   Modules accepted: Orders

## 2015-09-08 NOTE — Progress Notes (Signed)
Subjective:    Patient ID: Sara [name], female    DOB: 19-Oct-1945, 70 y.o.   MRN: [id number]  Pt presents to the office to recheck a cyst on left side of neck. Pt states she has had it for years, but over the last few weeks it has become more painful. Pt states she has 5 out 10 pain when moving her neck. PT was seen in the office on 08/28/14 and  Given rx of augmentin for bilateral otitis media and URI.  Cough This is a new problem. The current episode started in the past 7 days. The problem has been waxing and waning. The problem occurs constantly. Associated symptoms include rhinorrhea and shortness of breath. Pertinent negatives include no chills, ear pain, headaches, myalgias, nasal congestion, sore throat or wheezing. She has tried OTC cough suppressant for the symptoms. The treatment provided mild relief. There is no history of asthma or COPD.      Review of Systems  Constitutional: Negative.  Negative for chills.  HENT: Positive for rhinorrhea. Negative for ear pain and sore throat.   Eyes: Negative.   Respiratory: Positive for cough and shortness of breath. Negative for wheezing.   Cardiovascular: Negative.  Negative for palpitations.  Gastrointestinal: Negative.   Endocrine: Negative.   Genitourinary: Negative.   Musculoskeletal: Negative.  Negative for myalgias.  Neurological: Negative.  Negative for headaches.  Hematological: Negative.   Psychiatric/Behavioral: Negative.   All other systems reviewed and are negative.      Objective:   Physical Exam  Constitutional: She is oriented to person, place, and time. She appears well-developed and well-nourished. No distress.  HENT:  Head: Normocephalic and atraumatic.  Right Ear: Tympanic membrane is erythematous (slightly).  Mouth/Throat: Oropharynx is clear and moist.  Nasal passage erythemas with moderate swelling    Eyes: Pupils are equal, round, and reactive to light.  Neck: Normal range of motion. Neck supple. No  thyromegaly present.  Cardiovascular: Normal rate, regular rhythm, normal heart sounds and intact distal pulses.   No murmur heard. Pulmonary/Chest: Effort normal and breath sounds normal. No respiratory distress. She has no wheezes.  Abdominal: Soft. Bowel sounds are normal. She exhibits no distension. There is no tenderness.  Musculoskeletal: Normal range of motion. She exhibits no edema or tenderness.  Neurological: She is alert and oriented to person, place, and time. She has normal reflexes. No cranial nerve deficit.  Skin: Skin is warm and dry.  Fluid fluid Cyst behind left ear- approx 3 cmX 3 Cm  Psychiatric: She has a normal mood and affect. Her behavior is normal. Judgment and thought content normal.  Vitals reviewed.  Results for orders placed or performed in visit on 09/08/15  POCT Influenza A/B  Result Value Ref Range   Influenza A, POC Negative Negative   Influenza B, POC Negative Negative      BP 128/83 mmHg  Pulse 89  Temp(Src) 97.1 F (36.2 C)  Ht 5\' 4"  (1.626 m)  Wt 263 lb (119.296 kg)  BMI 45.12 kg/m2     Assessment & Plan:  1. Cough -- Take meds as prescribed - Use a cool mist humidifier  -Use saline nose sprays frequently -Saline irrigations of the nose can be very helpful if done frequently.  * 4X daily for 1 week*  * Use of a nettie pot can be helpful with this. Follow directions with this* -Force fluids -For any cough or congestion  Use plain Mucinex- regular strength or max strength is fine   *  Children- consult with Pharmacist for dosing -For fever or aces or pains- take tylenol or ibuprofen appropriate for age and weight.  * for fevers greater than 101 orally you may alternate ibuprofen and tylenol every  3 hours. -Throat lozenges if help -Continue Flonase every night - POCT Influenza A/B - benzonatate (TESSALON) 200 MG capsule; Take 1 capsule (200 mg total) by mouth 3 (three) times daily as needed.  Dispense: 30 capsule; Refill: 1  2. Pilar  cyst -Do not pick or squeeze  - Ambulatory referral to Dermatology  Evelina Dun, FNP

## 2015-09-12 ENCOUNTER — Telehealth: Payer: Self-pay | Admitting: *Deleted

## 2015-09-12 MED ORDER — LEVOFLOXACIN 500 MG PO TABS: 500.0000 mg | ORAL_TABLET | Freq: Every day | ORAL | Status: DC

## 2015-09-12 NOTE — Telephone Encounter (Signed)
Patient aware.

## 2015-09-12 NOTE — Telephone Encounter (Signed)
Patient states that she thought she was going to get an antibiotic for the cough and congestion. She is having to take care of her son who has been in the hospital and she really needs something. Please advise.

## 2015-09-12 NOTE — Telephone Encounter (Signed)
Levaquin Prescription sent to pharmacy   

## 2015-09-13 ENCOUNTER — Ambulatory Visit: Payer: Medicare Other | Admitting: Family

## 2015-09-15 DIAGNOSIS — B079 Viral wart, unspecified: Secondary | ICD-10-CM | POA: Diagnosis not present

## 2015-09-15 DIAGNOSIS — L309 Dermatitis, unspecified: Secondary | ICD-10-CM | POA: Diagnosis not present

## 2015-09-15 DIAGNOSIS — D225 Melanocytic nevi of trunk: Secondary | ICD-10-CM | POA: Diagnosis not present

## 2015-09-15 DIAGNOSIS — D170 Benign lipomatous neoplasm of skin and subcutaneous tissue of head, face and neck: Secondary | ICD-10-CM | POA: Diagnosis not present

## 2015-09-16 ENCOUNTER — Other Ambulatory Visit: Payer: Self-pay | Admitting: Cardiology

## 2015-09-20 DIAGNOSIS — M25521 Pain in right elbow: Secondary | ICD-10-CM | POA: Diagnosis not present

## 2015-10-11 ENCOUNTER — Other Ambulatory Visit: Payer: Self-pay | Admitting: Cardiology

## 2015-11-12 ENCOUNTER — Other Ambulatory Visit: Payer: Self-pay | Admitting: Cardiology

## 2015-11-14 DIAGNOSIS — M25551 Pain in right hip: Secondary | ICD-10-CM | POA: Diagnosis not present

## 2015-11-14 DIAGNOSIS — R1030 Lower abdominal pain, unspecified: Secondary | ICD-10-CM | POA: Diagnosis not present

## 2015-11-14 DIAGNOSIS — M1611 Unilateral primary osteoarthritis, right hip: Secondary | ICD-10-CM | POA: Diagnosis not present

## 2015-11-22 ENCOUNTER — Telehealth (HOSPITAL_COMMUNITY): Payer: Self-pay

## 2015-11-22 NOTE — Telephone Encounter (Signed)
Patient left VM on CHF clinic triage line asking for apt with Dr. Aundra Dubin. This patient is a general cardiology patient, no history of/active CHF, usually seen by Dr. Aundra Dubin at [address] office. Forwarded message to [address] triage basket to have them schedule her at that location.  Renee Pain

## 2015-11-23 DIAGNOSIS — M25551 Pain in right hip: Secondary | ICD-10-CM | POA: Diagnosis not present

## 2015-11-28 ENCOUNTER — Other Ambulatory Visit: Payer: Self-pay | Admitting: Cardiology

## 2015-12-01 NOTE — Telephone Encounter (Signed)
She needs an office visit for [place] to continue to refill medication.  Please ask [facility] to contact her to schedule office visit, can be with PA/NP.  Once she has a scheduled office visit her medication can be refilled to last to her scheduled appt.

## 2015-12-02 DIAGNOSIS — M25551 Pain in right hip: Secondary | ICD-10-CM | POA: Diagnosis not present

## 2015-12-05 ENCOUNTER — Encounter: Payer: Self-pay | Admitting: Family Medicine

## 2015-12-05 ENCOUNTER — Telehealth: Payer: Self-pay | Admitting: Family Medicine

## 2015-12-05 ENCOUNTER — Ambulatory Visit (INDEPENDENT_AMBULATORY_CARE_PROVIDER_SITE_OTHER): Payer: Medicare Other | Admitting: Family Medicine

## 2015-12-05 VITALS — BP 108/69 | HR 74 | Temp 97.2°F | Ht 64.0 in | Wt 255.0 lb

## 2015-12-05 DIAGNOSIS — E785 Hyperlipidemia, unspecified: Secondary | ICD-10-CM | POA: Diagnosis not present

## 2015-12-05 DIAGNOSIS — I2510 Atherosclerotic heart disease of native coronary artery without angina pectoris: Secondary | ICD-10-CM | POA: Diagnosis not present

## 2015-12-05 DIAGNOSIS — K219 Gastro-esophageal reflux disease without esophagitis: Secondary | ICD-10-CM | POA: Diagnosis not present

## 2015-12-05 DIAGNOSIS — I10 Essential (primary) hypertension: Secondary | ICD-10-CM | POA: Diagnosis not present

## 2015-12-05 NOTE — Patient Instructions (Signed)
Medicare Annual Wellness Visit  [place] and the medical providers at [facility] strive to bring you the best medical care.  In doing so we not only want to address your current medical conditions and concerns but also to detect new conditions early and prevent illness, disease and health-related problems.    Medicare offers a yearly Wellness Visit which allows our clinical staff to assess your need for preventative services including immunizations, lifestyle education, counseling to decrease risk of preventable diseases and screening for fall risk and other medical concerns.    This visit is provided free of charge (no copay) for all Medicare recipients. The clinical pharmacists at [facility] have begun to conduct these Wellness Visits which will also include a thorough review of all your medications.    As you primary medical provider recommend that you make an appointment for your Annual Wellness Visit if you have not done so already this year.  You may set up this appointment before you leave today or you may call back ([phone]) and schedule an appointment.  Please make sure when you call that you mention that you are scheduling your Annual Wellness Visit with the clinical pharmacist so that the appointment may be made for the proper length of time.     Continue current medications. Continue good therapeutic lifestyle changes which include good diet and exercise. Fall precautions discussed with patient. If an FOBT was given today- please return it to our front desk. If you are over 50 years old - you may need Prevnar 13 or the adult Pneumonia vaccine.  **Flu shots are available--- please call and schedule a FLU-CLINIC appointment**  After your visit with us today you will receive a survey in the mail or online from [employer] regarding your care with us. Please take a moment to fill this out. Your feedback is very  important to us as you can help us better understand your patient needs as well as improve your experience and satisfaction. WE CARE ABOUT YOU!!!    

## 2015-12-05 NOTE — Progress Notes (Signed)
Subjective:    Patient ID: Sara [name], female    DOB: 13-Nov-1945, 70 y.o.   MRN: [id number]  HPI Pt here for follow up and management of chronic medical problems which includes hypertension and hyperlipidemia. She is taking medications regularly. Her only concern today is some hip and groin pain but she is seeing orthopedic for injection in that hip tomorrow. She is status post bilateral knee replacements and thinking that she may need hip replacements somewhere in the future. She denies any current chest pain. She still does have some dependent edema and takes furosemide and spironolactone. Lipids were last checked last June and LDL was less than 100 but given her history of 3 MIs probably would like it to be a little lower. She is losing weight which will help.    Patient Active Problem List   Diagnosis Date Noted  . Hypokalemia 01/20/2015  . OA (osteoarthritis) of knee 12/22/2012  . AVNRT (AV nodal re-entry tachycardia) ([place])   . Atrial arrhythmia 07/03/2011  . OBSTRUCTIVE SLEEP APNEA 03/15/2010  . EDEMA 02/28/2009  . Hyperlipidemia 12/09/2008  . Essential hypertension 12/09/2008  . Coronary atherosclerosis 12/09/2008  . HEMORRHOIDS 12/09/2008  . GERD 12/09/2008  . PSORIASIS 12/09/2008  . Arthritis 12/09/2008  . VERTIGO 12/09/2008  . CARDIAC MURMUR 12/09/2008   Outpatient Encounter Prescriptions as of 12/05/2015  Medication Sig  . aspirin EC 81 MG tablet Take 81 mg by mouth daily.  Marland Kitchen atorvastatin (LIPITOR) 80 MG tablet Take 1 tablet (80 mg total) by mouth daily.  . Calcium Carbonate-Vitamin D (SUPER CALCIUM 600 + D3 PO) Take 1 tablet by mouth daily.  Marland Kitchen diltiazem (CARDIZEM CD) 120 MG 24 hr capsule TAKE (1) CAPSULE DAILY  . fluticasone (FLONASE) 50 MCG/ACT nasal spray Place 1 spray into both nostrils 2 (two) times daily as needed for allergies or rhinitis.  . furosemide (LASIX) 20 MG tablet Take 1 tablet (20 mg total) by mouth daily.  Marland Kitchen losartan (COZAAR) 50 MG tablet Take 1  tablet (50 mg total) by mouth daily.  Marland Kitchen omeprazole (PRILOSEC) 20 MG capsule Take 1 capsule (20 mg total) by mouth daily.  . potassium chloride (K-DUR,KLOR-CON) 10 MEQ tablet Take 1 tablet (10 mEq total) by mouth daily.  . sodium chloride (OCEAN) 0.65 % nasal spray Place 1 spray into the nose at bedtime.   Marland Kitchen spironolactone (ALDACTONE) 25 MG tablet Take 0.5 tablets (12.5 mg total) by mouth 2 (two) times daily.  . [DISCONTINUED] naproxen (NAPROSYN) 500 MG tablet Take 1 tablet (500 mg total) by mouth 2 (two) times daily with a meal.  . meclizine (ANTIVERT) 25 MG tablet Take 25 mg by mouth 2 (two) times daily as needed for dizziness. Reported on 12/05/2015  . nitroGLYCERIN (NITROSTAT) 0.4 MG SL tablet Place 0.4 mg under the tongue every 5 (five) minutes as needed. Reported on 12/05/2015  . [DISCONTINUED] benzonatate (TESSALON) 200 MG capsule Take 1 capsule (200 mg total) by mouth 3 (three) times daily as needed.  . [DISCONTINUED] levofloxacin (LEVAQUIN) 500 MG tablet Take 1 tablet (500 mg total) by mouth daily.  . [DISCONTINUED] Multiple Vitamins-Minerals (ZINC PO) Take 1 tablet by mouth daily.   No facility-administered encounter medications on file as of 12/05/2015.      Review of Systems  Constitutional: Negative.   HENT: Negative.   Eyes: Negative.   Respiratory: Negative.   Cardiovascular: Negative.   Gastrointestinal: Negative.   Endocrine: Negative.   Genitourinary: Negative.   Musculoskeletal: Positive for arthralgias (right  hip - seeing ortho).  Skin: Negative.   Allergic/Immunologic: Negative.   Neurological: Negative.   Hematological: Negative.   Psychiatric/Behavioral: Negative.        Objective:   Physical Exam  Constitutional: She is oriented to person, place, and time. She appears well-developed and well-nourished.  Cardiovascular: Normal rate, regular rhythm and normal heart sounds.   Pulmonary/Chest: Effort normal and breath sounds normal.  Musculoskeletal: She  exhibits edema.  Edema is 1+  Neurological: She is alert and oriented to person, place, and time.  Psychiatric: She has a normal mood and affect. Her behavior is normal.   BP 108/69 mmHg  Pulse 74  Temp(Src) 97.2 F (36.2 C) (Oral)  Ht '5\' 4"'  (1.626 m)  Wt 255 lb (115.667 kg)  BMI 43.75 kg/m2        Assessment & Plan:  1. Essential hypertension Herschel [place] is well controlled on losartan. Continue same - CMP14+EGFR; Future  2. Hyperlipidemia He prefers to wait to one-year anniversary to check lipids so will order that for July - Lipid panel; Future  3. Gastroesophageal reflux disease, esophagitis presence not specified Symptoms are well controlled no changes needed  4. Atherosclerosis of native coronary artery without angina pectoris, unspecified whether native or transplanted heart All of by cardiology but not having any symptoms or new problems and we are keeping track of her lipids.  Wardell Honour MD

## 2015-12-06 DIAGNOSIS — M1611 Unilateral primary osteoarthritis, right hip: Secondary | ICD-10-CM | POA: Diagnosis not present

## 2015-12-07 ENCOUNTER — Other Ambulatory Visit: Payer: Self-pay | Admitting: Cardiovascular Disease

## 2015-12-07 ENCOUNTER — Other Ambulatory Visit: Payer: Medicare Other

## 2015-12-07 ENCOUNTER — Other Ambulatory Visit: Payer: Self-pay | Admitting: Family

## 2015-12-07 DIAGNOSIS — I10 Essential (primary) hypertension: Secondary | ICD-10-CM | POA: Diagnosis not present

## 2015-12-07 DIAGNOSIS — E785 Hyperlipidemia, unspecified: Secondary | ICD-10-CM | POA: Diagnosis not present

## 2015-12-07 NOTE — Telephone Encounter (Signed)
Rx request sent to pharmacy.  

## 2015-12-08 LAB — CMP14+EGFR
ALK PHOS: 83 IU/L (ref 39–117)
ALT: 16 IU/L (ref 0–32)
AST: 18 IU/L (ref 0–40)
Albumin/Globulin Ratio: 1.4 (ref 1.2–2.2)
Albumin: 3.9 g/dL (ref 3.5–4.8)
BILIRUBIN TOTAL: 0.6 mg/dL (ref 0.0–1.2)
BUN/Creatinine Ratio: 23 (ref 12–28)
BUN: 16 mg/dL (ref 8–27)
CHLORIDE: 104 mmol/L (ref 96–106)
CO2: 19 mmol/L (ref 18–29)
CREATININE: 0.7 mg/dL (ref 0.57–1.00)
Calcium: 9.7 mg/dL (ref 8.7–10.3)
GFR calc Af Amer: 102 mL/min/{1.73_m2} (ref >59)
GFR calc non Af Amer: 88 mL/min/{1.73_m2} (ref >59)
GLUCOSE: 119 mg/dL — AB (ref 65–99)
Globulin, Total: 2.7 g/dL (ref 1.5–4.5)
Potassium: 4.2 mmol/L (ref 3.5–5.2)
Sodium: 140 mmol/L (ref 134–144)
Total Protein: 6.6 g/dL (ref 6.0–8.5)

## 2015-12-08 LAB — LIPID PANEL
CHOLESTEROL TOTAL: 175 mg/dL (ref 100–199)
Chol/HDL Ratio: 3.1 ratio units (ref 0.0–4.4)
HDL: 56 mg/dL (ref >39)
LDL CALC: 108 mg/dL — AB (ref 0–99)
TRIGLYCERIDES: 55 mg/dL (ref 0–149)
VLDL CHOLESTEROL CAL: 11 mg/dL (ref 5–40)

## 2015-12-08 LAB — HEPATITIS C ANTIBODY: Hep C Virus Ab: <0.1 s/co ratio (ref 0.0–0.9)

## 2015-12-08 NOTE — Telephone Encounter (Signed)
Patient had labs dram on 12/07/15

## 2015-12-15 ENCOUNTER — Telehealth: Payer: Self-pay | Admitting: Family Medicine

## 2015-12-15 ENCOUNTER — Other Ambulatory Visit: Payer: Self-pay | Admitting: Family Medicine

## 2015-12-15 DIAGNOSIS — M1611 Unilateral primary osteoarthritis, right hip: Secondary | ICD-10-CM | POA: Diagnosis not present

## 2015-12-15 DIAGNOSIS — R1030 Lower abdominal pain, unspecified: Secondary | ICD-10-CM | POA: Diagnosis not present

## 2015-12-15 DIAGNOSIS — M7061 Trochanteric bursitis, right hip: Secondary | ICD-10-CM | POA: Diagnosis not present

## 2015-12-15 DIAGNOSIS — M25551 Pain in right hip: Secondary | ICD-10-CM | POA: Diagnosis not present

## 2015-12-15 MED ORDER — POTASSIUM CHLORIDE CRYS ER 10 MEQ PO TBCR: EXTENDED_RELEASE_TABLET | ORAL | Status: DC

## 2015-12-15 NOTE — Telephone Encounter (Signed)
done

## 2015-12-15 NOTE — Telephone Encounter (Signed)
Called the patient about her potassium refill and let her know she needs an appointment. She states that she had called the office and was told there was no appointments available until August. She will call to make an appointment with [facility] at a later date but will contact her PCP for the refill for now.

## 2015-12-29 DIAGNOSIS — M7061 Trochanteric bursitis, right hip: Secondary | ICD-10-CM | POA: Diagnosis not present

## 2016-01-05 DIAGNOSIS — R1031 Right lower quadrant pain: Secondary | ICD-10-CM | POA: Diagnosis not present

## 2016-01-05 DIAGNOSIS — M25551 Pain in right hip: Secondary | ICD-10-CM | POA: Diagnosis not present

## 2016-01-05 DIAGNOSIS — M1611 Unilateral primary osteoarthritis, right hip: Secondary | ICD-10-CM | POA: Diagnosis not present

## 2016-01-06 ENCOUNTER — Other Ambulatory Visit: Payer: Self-pay | Admitting: Cardiology

## 2016-01-09 DIAGNOSIS — M1611 Unilateral primary osteoarthritis, right hip: Secondary | ICD-10-CM | POA: Diagnosis not present

## 2016-01-12 ENCOUNTER — Telehealth: Payer: Self-pay | Admitting: Cardiology

## 2016-01-12 NOTE — Telephone Encounter (Signed)
SPOKE WITH PT, HIP  SURGERY  TO BE  SCHEDULED  BEGINNING  OF  AUGUST  PENDING  CARDIAC  CLEARANCE.PT IS  OVER DUE  FOR  APPT  . APPT  MADE  WITH   KATY  THOMPSON PA  FOR   02-15-16 AT  8:00 AM .Adonis Housekeeper

## 2016-01-12 NOTE — Telephone Encounter (Signed)
New message  Pt calling to followup on clearance for surgery- stated it was faxed this weekend. Please call back and discuss.

## 2016-01-23 ENCOUNTER — Other Ambulatory Visit: Payer: Self-pay | Admitting: Family Medicine

## 2016-01-23 NOTE — Telephone Encounter (Signed)
Muscle relaxant for groin pain is unusual. I would not mind trying it for a few days but if it is not relieving pain she will need office visit can try Flexeril 10 mg 3 times a day #12

## 2016-01-23 NOTE — Telephone Encounter (Signed)
Patient states she is having groin pain and would like a muscle relax or. Patient states she is on Tramadol but would like something else. Advised patient she may need to be seen and she states she could not come her due to the pain. Please advise.

## 2016-01-25 NOTE — Telephone Encounter (Signed)
Left message for pt RX called into [facility]

## 2016-02-02 ENCOUNTER — Encounter (INDEPENDENT_AMBULATORY_CARE_PROVIDER_SITE_OTHER): Payer: Self-pay

## 2016-02-02 ENCOUNTER — Encounter: Payer: Self-pay | Admitting: Physician Assistant

## 2016-02-02 ENCOUNTER — Ambulatory Visit (INDEPENDENT_AMBULATORY_CARE_PROVIDER_SITE_OTHER): Payer: Medicare Other | Admitting: Physician Assistant

## 2016-02-02 ENCOUNTER — Ambulatory Visit: Payer: Medicare Other | Admitting: Physician Assistant

## 2016-02-02 VITALS — BP 140/90 | HR 91 | Ht 64.0 in | Wt 246.6 lb

## 2016-02-02 DIAGNOSIS — I10 Essential (primary) hypertension: Secondary | ICD-10-CM

## 2016-02-02 DIAGNOSIS — R0602 Shortness of breath: Secondary | ICD-10-CM | POA: Diagnosis not present

## 2016-02-02 DIAGNOSIS — E785 Hyperlipidemia, unspecified: Secondary | ICD-10-CM | POA: Diagnosis not present

## 2016-02-02 DIAGNOSIS — I2510 Atherosclerotic heart disease of native coronary artery without angina pectoris: Secondary | ICD-10-CM

## 2016-02-02 DIAGNOSIS — Z01818 Encounter for other preprocedural examination: Secondary | ICD-10-CM | POA: Diagnosis not present

## 2016-02-02 NOTE — Progress Notes (Signed)
Cardiology Office Note    Date:  02/02/2016   ID:  Sara [name], DOB 29-Jun-1946, MRN [id number]  PCP:  Wardell Honour, MD  Cardiologist:  Dr. Aundra Dubin   Chief complaint preoperative clearance for hip replacement  History of Present Illness:  Sara [name] is a 70 y.o. female  with history of CAD and AVNRT s/p ablation presents for cardiology clearance for hip replacement .  She had PCI/stent 3 to the LAD in 10/10.  In 12/12, she was admitted to [facility] with SVT and associated chest pain and elevated troponin.  Cath in 12/12 showed patent LAD stents.  She had AVNRT ablation 3/12.  She had Adenosine Cardiolite in 5/14 with no evidence for ischemia or infarction.    She was seen a couple of times recently by Richardson Dopp for exertional dyspnea.  She had the Cardiolite as described above in 5/14.  She had an echo in 12/14 with normal EF and no significant abnormalities, and she had PFTs in 12/14 with normal volumes and spirometry and mildly abnormal DLCO.  She continued to have rather marked dyspnea with exertion.  Given unexplained exertional symptoms, she had a left and right heart catheterization in 1/15.  This showed normal filling pressures, no pulmonary hypertension, and no obstructive CAD.    She was last seen by Dr. Algernon Huxley in 08/2014 and had lost 7 pounds and noticed improvement in her dyspnea on exertion. Her exertional dyspnea was felt secondary to obesity and deconditioning. Echo was unremarkable, PFTs with normal spirometry and lung volumes but mildly decreased DLCO.  Patient comes in today without any change in cardiac symptoms. She denies any chest pain. She can walk 50-100 yards before becoming short of breath or having hip pain. She is in a wheelchair today because she's having some chest pain. Her lipids are followed closely by primary care and LDL has been around 100 on Lipitor. Her blood pressure is up today but is been doing really well for the past 3 years and was good when  she was checked in May. She says it's from the pain she is in. She has occasional palpitations but very rare.     Past Medical History  Diagnosis Date  . HTN (hypertension)   . Hyperlipidemia   . GERD (gastroesophageal reflux disease)   . CAD (coronary artery disease)     a. s/p Xience DES x 3 to LAD;  b. nuc study 02/27/10: EF 67% no ischemia;  c.  echo 11/12: Mild LVH, EF [id number], grade 1 diastolic dysfunction, moderate LAE;  d.  LHC 07/03/11: LAD stents patent, RCA 25%, EF 55-65% ;  e. [place]. Myoview 5/14:  No ischemia, EF 68%  . Obesity   . Other psoriasis   . Arthritis   . Hemorrhoid   . Jaundice   . Diverticular disease   . Vertigo   . Shortness of breath   . AVNRT (AV nodal re-entry tachycardia) ([place])     s/p RFCA 09/2011  . Bronchitis   . Myocardial infarction ([place])     hx of x 2   . OSA (obstructive sleep apnea)     needs no cpap per patient   . Hepatitis     yellow jaundice as a child   . Hx of echocardiogram     a.  Echo (05/2011):  Mild LVH, EF 55-60%, Gr 1 DD, mod LAE.b. Echo (11/14):  Mild LVH, mild focal basal septal hypertrophy, EF 55-60%, Gr 1 DD,  mild LAE, atrial septal aneurysm    Past Surgical History  Procedure Laterality Date  . Tubal ligation    . Knee surgery      right  . Back surgery      Dr. Lawernce Pitts  . Cardiac catheterization  08/23/2011    Ablation AV  Node  . Abdominal hysterectomy    . Colonoscopy    . Upper gastrointestinal endoscopy    . Coronary angioplasty      with stents 2009   . Total knee arthroplasty Left 12/22/2012    Procedure: LEFT TOTAL KNEE ARTHROPLASTY;  Surgeon: Gearlean Alf, MD;  Location: [facility] ORS;  Service: Orthopedics;  Laterality: Left;  . Left heart catheterization with coronary angiogram N/A 07/03/2011    Procedure: LEFT HEART CATHETERIZATION WITH CORONARY ANGIOGRAM;  Surgeon: Minus Breeding, MD;  Location: [facility] CATH LAB;  Service: Cardiovascular;  Laterality: N/A;  . Supraventricular tachycardia ablation N/A 08/23/2011     Procedure: SUPRAVENTRICULAR TACHYCARDIA ABLATION;  Surgeon: Evans Lance, MD;  Location: [facility] CATH LAB;  Service: Cardiovascular;  Laterality: N/A;  . Left and right heart catheterization with coronary angiogram N/A 08/18/2013    Procedure: LEFT AND RIGHT HEART CATHETERIZATION WITH CORONARY ANGIOGRAM;  Surgeon: Larey Dresser, MD;  Location: [facility] CATH LAB;  Service: Cardiovascular;  Laterality: N/A;    Current Medications: Outpatient Prescriptions Prior to Visit  Medication Sig Dispense Refill  . aspirin EC 81 MG tablet Take 81 mg by mouth daily.    Marland Kitchen atorvastatin (LIPITOR) 80 MG tablet Take 1 tablet by mouth at bedtime.    . Calcium Carbonate-Vitamin D (SUPER CALCIUM 600 + D3 PO) Take 1 tablet by mouth daily.    Marland Kitchen diltiazem (CARDIZEM CD) 120 MG 24 hr capsule Take 1 capsule (120 mg total) by mouth daily. Patient is overdue for an appointment. Please call and schedule for further refills 15 capsule 0  . fluticasone (FLONASE) 50 MCG/ACT nasal spray Place 1 spray into both nostrils 2 (two) times daily as needed for allergies or rhinitis. 16 g 6  . furosemide (LASIX) 20 MG tablet Take 1 tablet (20 mg total) by mouth daily. 90 tablet 3  . losartan (COZAAR) 50 MG tablet Take 1 tablet (50 mg total) by mouth daily. 90 tablet 3  . meclizine (ANTIVERT) 25 MG tablet Take 25 mg by mouth 2 (two) times daily as needed for dizziness. Reported on 12/05/2015    . nitroGLYCERIN (NITROSTAT) 0.4 MG SL tablet Place 0.4 mg under the tongue every 5 (five) minutes as needed for chest pain (x 3 doses). Reported on 12/05/2015    . omeprazole (PRILOSEC) 20 MG capsule Take 1 capsule (20 mg total) by mouth daily. 90 capsule 3  . potassium chloride (K-DUR,KLOR-CON) 10 MEQ tablet Take 1 tablet (10 mEq total) by mouth daily. 15 tablet 0  . sodium chloride (OCEAN) 0.65 % nasal spray Place 1 spray into the nose at bedtime.     Marland Kitchen spironolactone (ALDACTONE) 25 MG tablet Take 0.5 tablets (12.5 mg total) by mouth 2 (two) times daily. 90  tablet 3   No facility-administered medications prior to visit.     Allergies:   Tape; Codeine; Crestor; and Nifedipine   Social History   Social History  . Marital Status: Married    Spouse Name: N/A  . Number of Children: 3  . Years of Education: N/A   Occupational History  . hairdresser    Social History Main Topics  . Smoking status: Never Smoker   .  Smokeless tobacco: Never Used  . Alcohol Use: No  . Drug Use: No  . Sexual Activity: Not Currently    Birth Control/ Protection: Post-menopausal   Other Topics Concern  . None   Social History Narrative      Family History:  The patient's family history includes Aneurysm in her father; Heart failure in her mother; Hypertension in her mother; Muscular dystrophy in her daughter; Seizures in her brother and son; Stroke in her mother. There is no history of Colon cancer.   ROS:   Please see the history of present illness.    Review of Systems  Constitution: Negative.  HENT: Negative.   Eyes: Negative.   Cardiovascular: Positive for dyspnea on exertion.  Respiratory: Negative.   Hematologic/Lymphatic: Negative.   Musculoskeletal: Positive for arthritis, joint pain and muscle weakness.  Gastrointestinal: Negative.   Genitourinary: Negative.   Neurological: Negative.    All other systems reviewed and are negative.   PHYSICAL EXAM:   VS:  BP 140/90 mmHg  Pulse 91  Ht 5\' 4"  (1.626 m)  Wt 246 lb 9.6 oz (111.857 kg)  BMI 42.31 kg/m2   GEN: Well nourished, well developed, in no acute distress Neck: no JVD, carotid bruits, or masses Cardiac: RRR; positive S4, 1/6 systolic murmur at the left sternal border, no rubs, no edema  Respiratory:  clear to auscultation bilaterally, normal work of breathing GI: soft, nontender, nondistended, + BS MS: no deformity or atrophy Skin: warm and dry, no rash Neuro:  Alert and Oriented x 3, Strength and sensation are intact Psych: euthymic mood, full affect  Wt Readings from Last 3  Encounters:  02/02/16 246 lb 9.6 oz (111.857 kg)  12/05/15 255 lb (115.667 kg)  09/08/15 263 lb (119.296 kg)      Studies/Labs Reviewed:   EKG:  EKG is  ordered today.  The ekg ordered today demonstrates Normal sinus rhythm with LVH, no acute change  Recent Labs: 02/03/2015: Hemoglobin 14.2 12/07/2015: ALT 16; BUN 16; Creatinine, Ser 0.70; Potassium 4.2; Sodium 140   Lipid Panel    Component Value Date/Time   CHOL 175 12/07/2015 0918   CHOL 157 09/02/2014 1451   TRIG 55 12/07/2015 0918   HDL 56 12/07/2015 0918   HDL 52.00 09/02/2014 1451   CHOLHDL 3.1 12/07/2015 0918   CHOLHDL 3 09/02/2014 1451   VLDL 19.4 09/02/2014 1451   LDLCALC 108* 12/07/2015 0918   LDLCALC 86 09/02/2014 1451    Additional studies/ records that were reviewed today include:  Nuclear stress test 2014 Impression Exercise Capacity:  Adenosine study with no exercise. BP Response:  Normal blood pressure response. Clinical Symptoms:  There is chest tightness and dyspnea. ECG Impression:  No significant ST segment change suggestive of ischemia. Comparison with Prior Nuclear Study: No images to compare  Overall Impression:  Normal stress nuclear study.  LV Ejection Fraction: 68%.  LV Wall Motion:  NL LV Function; NL Wall Motion  Coronary angiography: Coronary dominance: right  Left mainstem: No significant disease.    Left anterior descending (LAD): After a large D1, there were two non-overlapping stents in the proximal LAD.  There was minimal in-stent restenosis.  There was about 40% stenosis in the LAD between the two stents.    Left circumflex (LCx): There was a moderate ramus with no significant disease.  There was 30% stenosis in the proximal LCx.     Right coronary artery (RCA): Luminal irregularities.    Left ventriculography: Left ventricular systolic function is  normal, LVEF is estimated at 55-60%, there is no significant mitral regurgitation   Final Conclusions:  Filling pressures not  significant elevated on RHC, no pulmonary hypertension.  There was no obstructive CAD.    Recommendations:  Suspect exertional dyspnea is primarily related to obesity and deconditioning.  I suggested increased exercise and weight loss.    Sara [name] 08/18/2013, 11:25 AM    ASSESSMENT:    1. Preoperative clearance   2. Hyperlipidemia   3. Atherosclerosis of native coronary artery without angina pectoris, unspecified whether native or transplanted heart   4. DYSPNEA   5. HYPERTENSION, BENIGN      PLAN:  In order of problems listed above:  Preoperative clearance patient is here to be cleared for right hip replacement. She has history of CAD status post stent to the LAD in 2010. Last cath in 2015 showed nonobstructive CAD with normal LV function. Patient has not had any change in her symptoms. She has chronic dyspnea on exertion that is unchanged. She denies chest pain. Discussed this patient with Dr. Caryl Comes who concurs that she can proceed with right hip replacement without further cardiac workup.  Hyperlipidemia on Lipitor followed by primary care    CAD stable without symptoms last cath showed nonobstructive CAD in 2015   Dyspnea on exertion chronic felt related to her obesity and deconditioning  Hypertension blood pressure elevated today but has been stable. Most likely due to hip pain. Recommend 2 g sodium diet.    Medication Adjustments/Labs and Tests Ordered: Current medicines are reviewed at length with the patient today.  Concerns regarding medicines are outlined above.  Medication changes, Labs and Tests ordered today are listed in the Patient Instructions below. Patient Instructions  Medication Instructions:  Your physician recommends that you continue on your current medications as directed. Please refer to the Current Medication list given to you today.   Labwork: None ordered  Testing/Procedures: None ordered  Follow-Up: Your physician wants you to  follow-up in: [place].  You will receive a reminder letter in the mail two months in advance. If you don't receive a letter, please call our office to schedule the follow-up appointment.   Any Other Special Instructions Will Be Listed Below (If Applicable). GOOD LUCK WITH YOUR SURGERY!  SPEEDY RECOVERY!    If you need a refill on your cardiac medications before your next appointment, please call your pharmacy.       Signed, Ermalinda Barrios, PA-C  02/02/2016 10:10 AM    [facility] [address], [place], [place]  [address] Phone: [phone]; Fax: [phone]

## 2016-02-02 NOTE — Patient Instructions (Addendum)
Medication Instructions:  Your physician recommends that you continue on your current medications as directed. Please refer to the Current Medication list given to you today.   Labwork: None ordered  Testing/Procedures: None ordered  Follow-Up: Your physician wants you to follow-up in: [place].  You will receive a reminder letter in the mail two months in advance. If you don't receive a letter, please call our office to schedule the follow-up appointment.   Any Other Special Instructions Will Be Listed Below (If Applicable). GOOD LUCK WITH YOUR SURGERY!  SPEEDY RECOVERY!   Low-Sodium Eating Plan Sodium raises blood pressure and causes water to be held in the body. Getting less sodium from food will help lower your blood pressure, reduce any swelling, and protect your heart, liver, and kidneys. We get sodium by adding salt (sodium chloride) to food. Most of our sodium comes from canned, boxed, and frozen foods. Restaurant foods, fast foods, and pizza are also very high in sodium. Even if you take medicine to lower your blood pressure or to reduce fluid in your body, getting less sodium from your food is important. WHAT IS MY PLAN? Most people should limit their sodium intake to 2,300 mg a day. Your health care provider recommends that you limit your sodium intake to __________ a day.  WHAT DO I NEED TO KNOW ABOUT THIS EATING PLAN? For the low-sodium eating plan, you will follow these general guidelines:  Choose foods with a % Daily Value for sodium of less than 5% (as listed on the food label).   Use salt-free seasonings or herbs instead of table salt or sea salt.   Check with your health care provider or pharmacist before using salt substitutes.   Eat fresh foods.  Eat more vegetables and fruits.  Limit canned vegetables. If you do use them, rinse them well to decrease the sodium.   Limit cheese to 1 oz (28 g) per day.   Eat lower-sodium products, often  labeled as "lower sodium" or "no salt added."  Avoid foods that contain monosodium glutamate (MSG). MSG is sometimes added to [place] food and some canned foods.  Check food labels (Nutrition Facts labels) on foods to learn how much sodium is in one serving.  Eat more home-cooked food and less restaurant, buffet, and fast food.  When eating at a restaurant, ask that your food be prepared with less salt, or no salt if possible.  HOW DO I READ FOOD LABELS FOR SODIUM INFORMATION? The Nutrition Facts label lists the amount of sodium in one serving of the food. If you eat more than one serving, you must multiply the listed amount of sodium by the number of servings. Food labels may also identify foods as:  Sodium free--Less than 5 mg in a serving.  Very low sodium--35 mg or less in a serving.  Low sodium--140 mg or less in a serving.  Light in sodium--50% less sodium in a serving. For example, if a food that usually has 300 mg of sodium is changed to become light in sodium, it will have 150 mg of sodium.  Reduced sodium--25% less sodium in a serving. For example, if a food that usually has 400 mg of sodium is changed to reduced sodium, it will have 300 mg of sodium. WHAT FOODS CAN I EAT? Grains Low-sodium cereals, including oats, puffed wheat and rice, and shredded wheat cereals. Low-sodium crackers. Unsalted rice and pasta. Lower-sodium bread.  Vegetables Frozen or fresh vegetables. Low-sodium or reduced-sodium  canned vegetables. Low-sodium or reduced-sodium tomato sauce and paste. Low-sodium or reduced-sodium tomato and vegetable juices.  Fruits Fresh, frozen, and canned fruit. Fruit juice.  Meat and Other Protein Products Low-sodium canned tuna and salmon. Fresh or frozen meat, poultry, seafood, and fish. Lamb. Unsalted nuts. Dried beans, peas, and lentils without added salt. Unsalted canned beans. Homemade soups without salt. Eggs.  Dairy Milk. Soy milk. Ricotta cheese.  Low-sodium or reduced-sodium cheeses. Yogurt.  Condiments Fresh and dried herbs and spices. Salt-free seasonings. Onion and garlic powders. Low-sodium varieties of mustard and ketchup. Fresh or refrigerated horseradish. Lemon juice.  Fats and Oils Reduced-sodium salad dressings. Unsalted butter.  Other Unsalted popcorn and pretzels.  The items listed above may not be a complete list of recommended foods or beverages. Contact your dietitian for more options. WHAT FOODS ARE NOT RECOMMENDED? Grains Instant hot cereals. Bread stuffing, pancake, and biscuit mixes. Croutons. Seasoned rice or pasta mixes. Noodle soup cups. Boxed or frozen macaroni and cheese. Self-rising flour. Regular salted crackers. Vegetables Regular canned vegetables. Regular canned tomato sauce and paste. Regular tomato and vegetable juices. Frozen vegetables in sauces. Salted [place] fries. Olives. Angie Fava. Relishes. Sauerkraut. Salsa. Meat and Other Protein Products Salted, canned, smoked, spiced, or pickled meats, seafood, or fish. Bacon, ham, sausage, hot dogs, corned beef, chipped beef, and packaged luncheon meats. Salt pork. Jerky. Pickled herring. Anchovies, regular canned tuna, and sardines. Salted nuts. Dairy Processed cheese and cheese spreads. Cheese curds. Blue cheese and cottage cheese. Buttermilk.  Condiments Onion and garlic salt, seasoned salt, table salt, and sea salt. Canned and packaged gravies. Worcestershire sauce. Tartar sauce. Barbecue sauce. Teriyaki sauce. Soy sauce, including reduced sodium. Steak sauce. Fish sauce. Oyster sauce. Cocktail sauce. Horseradish that you find on the shelf. Regular ketchup and mustard. Meat flavorings and tenderizers. Bouillon cubes. Hot sauce. Tabasco sauce. Marinades. Taco seasonings. Relishes. Fats and Oils Regular salad dressings. Salted butter. Margarine. Ghee. Bacon fat.  Other Potato and tortilla chips. Corn chips and puffs. Salted popcorn and pretzels.  Canned or dried soups. Pizza. Frozen entrees and pot pies.  The items listed above may not be a complete list of foods and beverages to avoid. Contact your dietitian for more information.   This information is not intended to replace advice given to you by your health care provider. Make sure you discuss any questions you have with your health care provider.   Document Released: 12/29/2001 Document Revised: 07/30/2014 Document Reviewed: 05/13/2013 Elsevier Interactive Patient Education [employer].   If you need a refill on your cardiac medications before your next appointment, please call your pharmacy.

## 2016-02-04 ENCOUNTER — Other Ambulatory Visit: Payer: Self-pay | Admitting: Cardiology

## 2016-02-06 ENCOUNTER — Other Ambulatory Visit: Payer: Self-pay | Admitting: Family

## 2016-02-09 ENCOUNTER — Ambulatory Visit: Payer: Self-pay | Admitting: Orthopedic Surgery

## 2016-02-14 ENCOUNTER — Ambulatory Visit: Payer: Self-pay | Admitting: Orthopedic Surgery

## 2016-02-14 NOTE — H&P (Signed)
TOTAL HIP ADMISSION H&P  Patient is admitted for right total hip arthroplasty.  Subjective:  Chief Complaint: right hip pain  HPI: Sara [name], 70 y.o. female, has a history of pain and functional disability in the right hip(s) due to arthritis and patient has failed non-surgical conservative treatments for greater than 12 weeks to include NSAID's and/or analgesics, corticosteriod injections, flexibility and strengthening excercises, use of assistive devices, weight reduction as appropriate and activity modification.  Onset of symptoms was abrupt starting 1 years ago with rapidlly worsening course since that time.The patient noted no past surgery on the right hip(s).  Patient currently rates pain in the right hip at 10 out of 10 with activity. Patient has night pain, worsening of pain with activity and weight bearing, pain that interfers with activities of daily living, pain with passive range of motion and crepitus. Patient has evidence of subchondral cysts, subchondral sclerosis, periarticular osteophytes and joint space narrowing by imaging studies. This condition presents safety issues increasing the risk of falls. There is no current active infection.  Patient Active Problem List   Diagnosis Date Noted  . Hypokalemia 01/20/2015  . OA (osteoarthritis) of knee 12/22/2012  . AVNRT (AV nodal re-entry tachycardia) ([place])   . Atrial arrhythmia 07/03/2011  . OBSTRUCTIVE SLEEP APNEA 03/15/2010  . EDEMA 02/28/2009  . Hyperlipidemia 12/09/2008  . Essential hypertension 12/09/2008  . Coronary atherosclerosis 12/09/2008  . HEMORRHOIDS 12/09/2008  . GERD 12/09/2008  . PSORIASIS 12/09/2008  . Arthritis 12/09/2008  . VERTIGO 12/09/2008  . CARDIAC MURMUR 12/09/2008   Past Medical History:  Diagnosis Date  . Arthritis   . 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 Bronchitis   . CAD (coronary artery disease)    a. s/p Xience DES x 3 to LAD;  b. nuc study 02/27/10: EF 67%  no ischemia;  c.  echo 11/12: Mild LVH, EF [id number], grade 1 diastolic dysfunction, moderate LAE;  d.  LHC 07/03/11: LAD stents patent, RCA 25%, EF 55-65% ;  e. [place]. Myoview 5/14:  No ischemia, EF 68%  . Diverticular disease   . GERD (gastroesophageal reflux disease)   . Hemorrhoid   . Hepatitis    yellow jaundice as a child   . HTN (hypertension)   . Hx of echocardiogram    a.  Echo (05/2011):  Mild LVH, EF 55-60%, Gr 1 DD, mod LAE.b. Echo (11/14):  Mild LVH, mild focal basal septal hypertrophy, EF 55-60%, Gr 1 DD, mild LAE, atrial septal aneurysm  . Hyperlipidemia   . Jaundice   . Myocardial infarction ([facility])    hx of x 2   . Obesity   . OSA (obstructive sleep apnea)    needs no cpap per patient   . Other psoriasis   . Shortness of breath   . Vertigo     Past Surgical History:  Procedure Laterality Date  . ABDOMINAL HYSTERECTOMY    . BACK SURGERY     Dr. Lawernce Pitts  . CARDIAC CATHETERIZATION  08/23/2011   Ablation AV  Node  . COLONOSCOPY    . CORONARY ANGIOPLASTY     with stents 2009   . KNEE SURGERY     right  . LEFT AND RIGHT HEART CATHETERIZATION WITH CORONARY ANGIOGRAM N/A 08/18/2013   Procedure: LEFT AND RIGHT HEART CATHETERIZATION WITH CORONARY ANGIOGRAM;  Surgeon: Larey Dresser, MD;  Location: [facility] CATH LAB;  Service: Cardiovascular;  Laterality: N/A;  . LEFT HEART CATHETERIZATION WITH  CORONARY ANGIOGRAM N/A 07/03/2011   Procedure: LEFT HEART CATHETERIZATION WITH CORONARY ANGIOGRAM;  Surgeon: Minus Breeding, MD;  Location: [facility] CATH LAB;  Service: Cardiovascular;  Laterality: N/A;  . SUPRAVENTRICULAR TACHYCARDIA ABLATION N/A 08/23/2011   Procedure: SUPRAVENTRICULAR TACHYCARDIA ABLATION;  Surgeon: Evans Lance, MD;  Location: [facility] CATH LAB;  Service: Cardiovascular;  Laterality: N/A;  . TOTAL KNEE ARTHROPLASTY Left 12/22/2012   Procedure: LEFT TOTAL KNEE ARTHROPLASTY;  Surgeon: Gearlean Alf, MD;  Location: [facility] ORS;  Service: Orthopedics;  Laterality: Left;  . TUBAL LIGATION     . UPPER GASTROINTESTINAL ENDOSCOPY       (Not in a hospital admission) Allergies  Allergen Reactions  . Tape     bleeding  . Codeine Itching  . Crestor [Rosuvastatin] Other (See Comments)    myalgias  . Nifedipine Other (See Comments)    Brings blood pressure up really fast PROCARDIA    Social History  Substance Use Topics  . Smoking status: Never Smoker  . Smokeless tobacco: Never Used  . Alcohol use No    Family History  Problem Relation Age of Onset  . Aneurysm Father   . Stroke Mother   . Hypertension Mother   . Heart failure Mother   . Seizures Brother   . Muscular dystrophy Daughter     dx as infant, passed age 38  . Colon cancer Neg Hx   . Seizures Son      Review of Systems  Constitutional: Negative.   HENT: Negative.   Eyes: Negative.   Respiratory: Positive for shortness of breath.   Cardiovascular: Positive for orthopnea. Negative for chest pain, palpitations, claudication, leg swelling and PND.  Gastrointestinal: Negative.   Genitourinary: Negative.   Musculoskeletal: Positive for back pain, joint pain and myalgias.  Skin: Negative.   Neurological: Negative.   Endo/Heme/Allergies: Negative.   Psychiatric/Behavioral: Negative.     Objective:  Physical Exam  Constitutional: She is oriented to person, place, and time. She appears well-developed and well-nourished.  HENT:  Head: Normocephalic and atraumatic.  Eyes: Conjunctivae and EOM are normal. Pupils are equal, round, and reactive to light.  Neck: Normal range of motion. Neck supple.  Cardiovascular: Normal rate, regular rhythm and intact distal pulses.   Respiratory: Effort normal and breath sounds normal. No respiratory distress.  GI: Soft. Bowel sounds are normal. She exhibits no distension.  Genitourinary:  Genitourinary Comments: deferred  Musculoskeletal:       Right hip: She exhibits decreased range of motion, decreased strength, tenderness and crepitus.  Neurological: She is alert  and oriented to person, place, and time. She has normal reflexes.  Skin: Skin is warm and dry.  Psychiatric: She has a normal mood and affect. Her behavior is normal. Judgment and thought content normal.    Vital signs in last 24 hours: @VSRANGES @  Labs:   Estimated body mass index is 42.33 kg/m as calculated from the following:   Height as of 02/02/16: 5\' 4"  (1.626 m).   Weight as of 02/02/16: 111.9 kg (246 lb 9.6 oz).   Imaging Review Plain radiographs demonstrate severe degenerative joint disease of the right hip(s). The bone quality appears to be adequate for age and reported activity level.  Assessment/Plan:  End stage arthritis, right hip(s)  The patient history, physical examination, clinical judgement of the provider and imaging studies are consistent with end stage degenerative joint disease of the right hip(s) and total hip arthroplasty is deemed medically necessary. The treatment options including medical management, injection  therapy, arthroscopy and arthroplasty were discussed at length. The risks and benefits of total hip arthroplasty were presented and reviewed. The risks due to aseptic loosening, infection, stiffness, dislocation/subluxation,  thromboembolic complications and other imponderables were discussed.  The patient acknowledged the explanation, agreed to proceed with the plan and consent was signed. Patient is being admitted for inpatient treatment for surgery, pain control, PT, OT, prophylactic antibiotics, VTE prophylaxis, progressive ambulation and ADL's and discharge planning.The patient is planning to be discharged home with home health services

## 2016-02-15 ENCOUNTER — Ambulatory Visit: Payer: Medicare Other | Admitting: Physician Assistant

## 2016-02-17 ENCOUNTER — Encounter (INDEPENDENT_AMBULATORY_CARE_PROVIDER_SITE_OTHER): Payer: Self-pay

## 2016-02-17 ENCOUNTER — Encounter (HOSPITAL_COMMUNITY): Payer: Self-pay

## 2016-02-17 ENCOUNTER — Encounter (HOSPITAL_COMMUNITY)
Admission: RE | Admit: 2016-02-17 | Discharge: 2016-02-17 | Disposition: A | Discharge status: Home / Self Care | Payer: Medicare Other | Source: Ambulatory Visit | Attending: Orthopedic Surgery | Admitting: Orthopedic Surgery

## 2016-02-17 DIAGNOSIS — Z0183 Encounter for blood typing: Secondary | ICD-10-CM | POA: Insufficient documentation

## 2016-02-17 DIAGNOSIS — M1611 Unilateral primary osteoarthritis, right hip: Secondary | ICD-10-CM | POA: Insufficient documentation

## 2016-02-17 DIAGNOSIS — Z01812 Encounter for preprocedural laboratory examination: Secondary | ICD-10-CM | POA: Insufficient documentation

## 2016-02-17 HISTORY — DX: Reserved for inherently not codable concepts without codable children: IMO0001

## 2016-02-17 HISTORY — DX: Essential (primary) hypertension: I10

## 2016-02-17 HISTORY — DX: Unspecified hearing loss, unspecified ear: H91.90

## 2016-02-17 LAB — SURGICAL PCR SCREEN
MRSA, PCR: NEGATIVE
Staphylococcus aureus: NEGATIVE

## 2016-02-17 LAB — CBC
HEMATOCRIT: 43.9 % (ref 36.0–46.0)
Hemoglobin: 15 g/dL (ref 12.0–15.0)
MCH: 32.9 pg (ref 26.0–34.0)
MCHC: 34.2 g/dL (ref 30.0–36.0)
MCV: 96.3 fL (ref 78.0–100.0)
PLATELETS: 199 10*3/uL (ref 150–400)
RBC: 4.56 MIL/uL (ref 3.87–5.11)
RDW: 14.9 % (ref 11.5–15.5)
WBC: 8.2 10*3/uL (ref 4.0–10.5)

## 2016-02-17 LAB — BASIC METABOLIC PANEL
Anion gap: 7 (ref 5–15)
BUN: 13 mg/dL (ref 6–20)
CO2: 26 mmol/L (ref 22–32)
Calcium: 9.3 mg/dL (ref 8.9–10.3)
Chloride: 105 mmol/L (ref 101–111)
Creatinine, Ser: 0.86 mg/dL (ref 0.44–1.00)
Glucose, Bld: 154 mg/dL — ABNORMAL HIGH (ref 65–99)
POTASSIUM: 4 mmol/L (ref 3.5–5.1)
SODIUM: 138 mmol/L (ref 135–145)

## 2016-02-17 NOTE — Patient Instructions (Addendum)
Sara [name]  02/17/2016   Your procedure is scheduled on: 02-23-16  Report to [facility] Main  Entrance take [facility]  elevators to 3rd floor to  [place] at 2:00 PM.  Call this number if you have problems the morning of surgery [phone]   Remember: ONLY 1 PERSON MAY GO WITH YOU TO SHORT STAY TO GET  READY MORNING OF [place].  Do not eat food or drink liquids :After Midnight.Exception May have Clear Liquids only 12 midnight to 1000 AM, then nothing.     Take these medicines the morning of surgery with A SIP OF WATER: Diltiazem. Omeprazole. Tramadol. Tylenol. Flonase-if not using Mupirocin Ointment. DO NOT TAKE ANY DIABETIC MEDICATIONS DAY OF YOUR SURGERY                               You may not have any metal on your body including hair pins and              piercings  Do not wear jewelry, make-up, lotions, powders or perfumes, deodorant             Do not wear nail polish.  Do not shave  48 hours prior to surgery.              Men may shave face and neck.   Do not bring valuables to the hospital. [place].  Contacts, dentures or bridgework may not be worn into surgery.  Leave suitcase in the car. After surgery it may be brought to your room.     Patients discharged the day of surgery will not be allowed to drive home.  Name and phone number of your driver: Sara [name] -spouse [phone] cell  Special Instructions: N/A              Please read over the following fact sheets you were given: _____________________________________________________________________             [facility] - Preparing for Surgery Before surgery, you can play an important role.  Because skin is not sterile, your skin needs to be as free of germs as possible.  You can reduce the number of germs on your skin by washing with CHG (chlorahexidine gluconate) soap before surgery.  CHG is an antiseptic cleaner which kills germs  and bonds with the skin to continue killing germs even after washing. Please DO NOT use if you have an allergy to CHG or antibacterial soaps.  If your skin becomes reddened/irritated stop using the CHG and inform your nurse when you arrive at Short Stay. Do not shave (including legs and underarms) for at least 48 hours prior to the first CHG shower.  You may shave your face/neck. Please follow these instructions carefully:  1.  Shower with CHG Soap the night before surgery and the  morning of Surgery.  2.  If you choose to wash your hair, wash your hair first as usual with your  normal  shampoo.  3.  After you shampoo, rinse your hair and body thoroughly to remove the  shampoo.                           4.  Use CHG as you would any other liquid soap.  You can apply chg directly  to the skin and wash                       Gently with a scrungie or clean washcloth.  5.  Apply the CHG Soap to your body ONLY FROM THE NECK DOWN.   Do not use on face/ open                           Wound or open sores. Avoid contact with eyes, ears mouth and genitals (private parts).                       Wash face,  Genitals (private parts) with your normal soap.             6.  Wash thoroughly, paying special attention to the area where your surgery  will be performed.  7.  Thoroughly rinse your body with warm water from the neck down.  8.  DO NOT shower/wash with your normal soap after using and rinsing off  the CHG Soap.                9.  Pat yourself dry with a clean towel.            10.  Wear clean pajamas.            11.  Place clean sheets on your bed the night of your first shower and do not  sleep with pets. Day of Surgery : Do not apply any lotions/deodorants the morning of surgery.  Please wear clean clothes to the hospital/surgery center.  FAILURE TO FOLLOW THESE INSTRUCTIONS MAY RESULT IN THE CANCELLATION OF YOUR SURGERY PATIENT SIGNATURE_________________________________  NURSE  SIGNATURE__________________________________  ________________________________________________________________________   Sara [name]  An incentive spirometer is a tool that can help keep your lungs clear and active. This tool measures how well you are filling your lungs with each breath. Taking long deep breaths may help reverse or decrease the chance of developing breathing (pulmonary) problems (especially infection) following:  A long period of time when you are unable to move or be active. BEFORE THE PROCEDURE   If the spirometer includes an indicator to show your best effort, your nurse or respiratory therapist will set it to a desired goal.  If possible, sit up straight or lean slightly forward. Try not to slouch.  Hold the incentive spirometer in an upright position. INSTRUCTIONS FOR USE  1. Sit on the edge of your bed if possible, or sit up as far as you can in bed or on a chair. 2. Hold the incentive spirometer in an upright position. 3. Breathe out normally. 4. Place the mouthpiece in your mouth and seal your lips tightly around it. 5. Breathe in slowly and as deeply as possible, raising the piston or the ball toward the top of the column. 6. Hold your breath for 3-5 seconds or for as long as possible. Allow the piston or ball to fall to the bottom of the column. 7. Remove the mouthpiece from your mouth and breathe out normally. 8. Rest for a few seconds and repeat Steps 1 through 7 at least 10 times every 1-2 hours when you are awake. Take your time and take a few normal breaths between deep breaths. 9. The spirometer may include an indicator to show your best  effort. Use the indicator as a goal to work toward during each repetition. 10. After each set of 10 deep breaths, practice coughing to be sure your lungs are clear. If you have an incision (the cut made at the time of surgery), support your incision when coughing by placing a pillow or rolled up towels firmly  against it. Once you are able to get out of bed, walk around indoors and cough well. You may stop using the incentive spirometer when instructed by your caregiver.  RISKS AND COMPLICATIONS  Take your time so you do not get dizzy or light-headed.  If you are in pain, you may need to take or ask for pain medication before doing incentive spirometry. It is harder to take a deep breath if you are having pain. AFTER USE  Rest and breathe slowly and easily.  It can be helpful to keep track of a log of your progress. Your caregiver can provide you with a simple table to help with this. If you are using the spirometer at home, follow these instructions: [place] IF:   You are having difficultly using the spirometer.  You have trouble using the spirometer as often as instructed.  Your pain medication is not giving enough relief while using the spirometer.  You develop fever of 100.5 F (38.1 C) or higher. SEEK IMMEDIATE MEDICAL CARE IF:   You cough up bloody sputum that had not been present before.  You develop fever of 102 F (38.9 C) or greater.  You develop worsening pain at or near the incision site. MAKE SURE YOU:   Understand these instructions.  Will watch your condition.  Will get help right away if you are not doing well or get worse. Document Released: 11/19/2006 Document Revised: 10/01/2011 Document Reviewed: 01/20/2007 Exit[employer] Patient Information 2014 ExitCare, [place].   ________________________________________________________________________  WHAT IS A BLOOD TRANSFUSION? Blood Transfusion Information  A transfusion is the replacement of blood or some of its parts. Blood is made up of multiple cells which provide different functions.  Red blood cells carry oxygen and are used for blood loss replacement.  White blood cells fight against infection.  Platelets control bleeding.  Plasma helps clot blood.  Other blood products are available for  specialized needs, such as hemophilia or other clotting disorders. BEFORE THE TRANSFUSION  Who gives blood for transfusions?   Healthy volunteers who are fully evaluated to make sure their blood is safe. This is blood bank blood. Transfusion therapy is the safest it has ever been in the practice of medicine. Before blood is taken from a donor, a complete history is taken to make sure that person has no history of diseases nor engages in risky social behavior (examples are intravenous drug use or sexual activity with multiple partners). The donor's travel history is screened to minimize risk of transmitting infections, such as malaria. The donated blood is tested for signs of infectious diseases, such as HIV and hepatitis. The blood is then tested to be sure it is compatible with you in order to minimize the chance of a transfusion reaction. If you or a relative donates blood, this is often done in anticipation of surgery and is not appropriate for emergency situations. It takes many days to process the donated blood. RISKS AND COMPLICATIONS Although transfusion therapy is very safe and saves many lives, the main dangers of transfusion include:   Getting an infectious disease.  Developing a transfusion reaction. This is an allergic reaction to something in  the blood you were given. Every precaution is taken to prevent this. The decision to have a blood transfusion has been considered carefully by your caregiver before blood is given. Blood is not given unless the benefits outweigh the risks. AFTER THE TRANSFUSION  Right after receiving a blood transfusion, you will usually feel much better and more energetic. This is especially true if your red blood cells have gotten low (anemic). The transfusion raises the level of the red blood cells which carry oxygen, and this usually causes an energy increase.  The nurse administering the transfusion will monitor you carefully for complications. HOME CARE  INSTRUCTIONS  No special instructions are needed after a transfusion. You may find your energy is better. Speak with your caregiver about any limitations on activity for underlying diseases you may have. SEEK MEDICAL CARE IF:   Your condition is not improving after your transfusion.  You develop redness or irritation at the intravenous (IV) site. SEEK IMMEDIATE MEDICAL CARE IF:  Any of the following symptoms occur over the next 12 hours:  Shaking chills.  You have a temperature by mouth above 102 F (38.9 C), not controlled by medicine.  Chest, back, or muscle pain.  People around you feel you are not acting correctly or are confused.  Shortness of breath or difficulty breathing.  Dizziness and fainting.  You get a rash or develop hives.  You have a decrease in urine output.  Your urine turns a dark color or changes to pink, red, or brown. Any of the following symptoms occur over the next 10 days:  You have a temperature by mouth above 102 F (38.9 C), not controlled by medicine.  Shortness of breath.  Weakness after normal activity.  The white part of the eye turns yellow (jaundice).  You have a decrease in the amount of urine or are urinating less often.  Your urine turns a dark color or changes to pink, red, or brown. Document Released: 07/06/2000 Document Revised: 10/01/2011 Document Reviewed: 02/23/2008 [facility] Patient Information 2014 [place], [place].  _______________________________________________________________________

## 2016-02-17 NOTE — Pre-Procedure Instructions (Signed)
EKG 02-02-16, Echo 11'14 Epic. Clearance note (Dr. McLean,cardiology 02-02-16 Epic.Dr. Mabe,Dentist, Dr. [name] Heck, PCP with chart.

## 2016-02-23 ENCOUNTER — Inpatient Hospital Stay (HOSPITAL_COMMUNITY): Payer: Medicare Other | Admitting: Certified Registered Nurse Anesthetist

## 2016-02-23 ENCOUNTER — Inpatient Hospital Stay (HOSPITAL_COMMUNITY): Payer: Medicare Other

## 2016-02-23 ENCOUNTER — Encounter (HOSPITAL_COMMUNITY)
Admission: RE | Disposition: A | Discharge status: Home Health | Payer: Self-pay | Source: Ambulatory Visit | Attending: Orthopedic Surgery

## 2016-02-23 ENCOUNTER — Inpatient Hospital Stay (HOSPITAL_COMMUNITY)
Admission: RE | Admit: 2016-02-23 | Discharge: 2016-02-24 | DRG: 470 | Disposition: A | Discharge status: Home Health | Payer: Medicare Other | Source: Ambulatory Visit | Attending: Orthopedic Surgery | Admitting: Orthopedic Surgery

## 2016-02-23 ENCOUNTER — Encounter (HOSPITAL_COMMUNITY): Payer: Self-pay | Admitting: *Deleted

## 2016-02-23 DIAGNOSIS — E785 Hyperlipidemia, unspecified: Secondary | ICD-10-CM | POA: Diagnosis not present

## 2016-02-23 DIAGNOSIS — M1611 Unilateral primary osteoarthritis, right hip: Principal | ICD-10-CM | POA: Diagnosis present

## 2016-02-23 DIAGNOSIS — G4733 Obstructive sleep apnea (adult) (pediatric): Secondary | ICD-10-CM | POA: Diagnosis present

## 2016-02-23 DIAGNOSIS — Z09 Encounter for follow-up examination after completed treatment for conditions other than malignant neoplasm: Secondary | ICD-10-CM

## 2016-02-23 DIAGNOSIS — Z79899 Other long term (current) drug therapy: Secondary | ICD-10-CM | POA: Diagnosis not present

## 2016-02-23 DIAGNOSIS — Z955 Presence of coronary angioplasty implant and graft: Secondary | ICD-10-CM | POA: Diagnosis not present

## 2016-02-23 DIAGNOSIS — H9193 Unspecified hearing loss, bilateral: Secondary | ICD-10-CM | POA: Diagnosis present

## 2016-02-23 DIAGNOSIS — I252 Old myocardial infarction: Secondary | ICD-10-CM

## 2016-02-23 DIAGNOSIS — T148XXA Other injury of unspecified body region, initial encounter: Secondary | ICD-10-CM

## 2016-02-23 DIAGNOSIS — Z6841 Body Mass Index (BMI) 40.0 and over, adult: Secondary | ICD-10-CM | POA: Diagnosis not present

## 2016-02-23 DIAGNOSIS — K219 Gastro-esophageal reflux disease without esophagitis: Secondary | ICD-10-CM | POA: Diagnosis not present

## 2016-02-23 DIAGNOSIS — I2510 Atherosclerotic heart disease of native coronary artery without angina pectoris: Secondary | ICD-10-CM | POA: Diagnosis present

## 2016-02-23 DIAGNOSIS — M25551 Pain in right hip: Secondary | ICD-10-CM | POA: Diagnosis present

## 2016-02-23 DIAGNOSIS — I10 Essential (primary) hypertension: Secondary | ICD-10-CM | POA: Diagnosis not present

## 2016-02-23 DIAGNOSIS — Z7982 Long term (current) use of aspirin: Secondary | ICD-10-CM | POA: Diagnosis not present

## 2016-02-23 DIAGNOSIS — Z471 Aftercare following joint replacement surgery: Secondary | ICD-10-CM | POA: Diagnosis not present

## 2016-02-23 DIAGNOSIS — Z96641 Presence of right artificial hip joint: Secondary | ICD-10-CM | POA: Diagnosis not present

## 2016-02-23 HISTORY — PX: TOTAL HIP ARTHROPLASTY: SHX124

## 2016-02-23 LAB — TYPE AND SCREEN
ABO/RH(D): O NEG
Antibody Screen: NEGATIVE

## 2016-02-23 SURGERY — ARTHROPLASTY, HIP, TOTAL, ANTERIOR APPROACH
Anesthesia: Spinal | Site: Hip | Laterality: Right

## 2016-02-23 MED ORDER — TRANEXAMIC ACID 1000 MG/10ML IV SOLN
1000.0000 mg | Freq: Once | INTRAVENOUS | Status: AC
  Administered 2016-02-23: 1000 mg via INTRAVENOUS
  Filled 2016-02-23: qty 10

## 2016-02-23 MED ORDER — PROPOFOL 10 MG/ML IV BOLUS
INTRAVENOUS | Status: AC
Start: 2016-02-23 — End: 2016-02-23
  Filled 2016-02-23: qty 20

## 2016-02-23 MED ORDER — DILTIAZEM HCL ER COATED BEADS 120 MG PO CP24
120.0000 mg | ORAL_CAPSULE | Freq: Every day | ORAL | Status: DC
  Administered 2016-02-24: 120 mg via ORAL
  Filled 2016-02-23: qty 1

## 2016-02-23 MED ORDER — DOCUSATE SODIUM 100 MG PO CAPS
100.0000 mg | ORAL_CAPSULE | Freq: Two times a day (BID) | ORAL | Status: DC
  Administered 2016-02-23 – 2016-02-24 (×2): 100 mg via ORAL
  Filled 2016-02-23 (×2): qty 1

## 2016-02-23 MED ORDER — ALBUMIN HUMAN 5 % IV SOLN
INTRAVENOUS | Status: DC | PRN
  Administered 2016-02-23 (×2): via INTRAVENOUS

## 2016-02-23 MED ORDER — BUPIVACAINE HCL (PF) 0.5 % IJ SOLN
INTRAMUSCULAR | Status: AC
  Filled 2016-02-23: qty 30

## 2016-02-23 MED ORDER — PROMETHAZINE HCL 25 MG/ML IJ SOLN: 6.2500 mg | INTRAMUSCULAR | Status: DC | PRN

## 2016-02-23 MED ORDER — SPIRONOLACTONE 12.5 MG HALF TABLET
12.5000 mg | ORAL_TABLET | Freq: Every day | ORAL | Status: DC
  Administered 2016-02-23: 12.5 mg via ORAL
  Filled 2016-02-23 (×2): qty 1

## 2016-02-23 MED ORDER — ONDANSETRON HCL 4 MG/2ML IJ SOLN
INTRAMUSCULAR | Status: AC
  Filled 2016-02-23: qty 2

## 2016-02-23 MED ORDER — PROPOFOL 10 MG/ML IV BOLUS
INTRAVENOUS | Status: AC
  Filled 2016-02-23: qty 20

## 2016-02-23 MED ORDER — FENTANYL CITRATE (PF) 100 MCG/2ML IJ SOLN
INTRAMUSCULAR | Status: AC
  Filled 2016-02-23: qty 2

## 2016-02-23 MED ORDER — METHOCARBAMOL 500 MG PO TABS: 500.0000 mg | ORAL_TABLET | Freq: Four times a day (QID) | ORAL | Status: DC | PRN

## 2016-02-23 MED ORDER — HYDROMORPHONE HCL 1 MG/ML IJ SOLN: 0.5000 mg | INTRAMUSCULAR | Status: DC | PRN

## 2016-02-23 MED ORDER — BUPIVACAINE HCL (PF) 0.5 % IJ SOLN
INTRAMUSCULAR | Status: DC | PRN
  Administered 2016-02-23: 3 mL

## 2016-02-23 MED ORDER — SODIUM CHLORIDE 0.9 % IV SOLN
INTRAVENOUS | Status: DC
  Administered 2016-02-23 – 2016-02-24 (×2): via INTRAVENOUS

## 2016-02-23 MED ORDER — ONDANSETRON HCL 4 MG PO TABS: 4.0000 mg | ORAL_TABLET | Freq: Four times a day (QID) | ORAL | Status: DC | PRN

## 2016-02-23 MED ORDER — CHLORHEXIDINE GLUCONATE 4 % EX LIQD: 60.0000 mL | Freq: Once | CUTANEOUS | Status: DC

## 2016-02-23 MED ORDER — CALCIUM CARBONATE-VITAMIN D 600-400 MG-UNIT PO TABS: 1.0000 | ORAL_TABLET | Freq: Every day | ORAL | Status: DC

## 2016-02-23 MED ORDER — DEXAMETHASONE SODIUM PHOSPHATE 10 MG/ML IJ SOLN
INTRAMUSCULAR | Status: DC | PRN
  Administered 2016-02-23: 10 mg via INTRAVENOUS

## 2016-02-23 MED ORDER — HYDROMORPHONE HCL 1 MG/ML IJ SOLN
INTRAMUSCULAR | Status: AC
  Filled 2016-02-23: qty 1

## 2016-02-23 MED ORDER — MIDAZOLAM HCL 2 MG/2ML IJ SOLN
INTRAMUSCULAR | Status: AC
  Filled 2016-02-23: qty 2

## 2016-02-23 MED ORDER — DEXAMETHASONE SODIUM PHOSPHATE 10 MG/ML IJ SOLN
10.0000 mg | Freq: Once | INTRAMUSCULAR | Status: AC
  Administered 2016-02-24: 10 mg via INTRAVENOUS
  Filled 2016-02-23: qty 1

## 2016-02-23 MED ORDER — SODIUM CHLORIDE 0.9 % IV SOLN: INTRAVENOUS | Status: DC

## 2016-02-23 MED ORDER — BUPIVACAINE-EPINEPHRINE 0.25% -1:200000 IJ SOLN
INTRAMUSCULAR | Status: DC | PRN
  Administered 2016-02-23: 30 mL

## 2016-02-23 MED ORDER — MIDAZOLAM HCL 2 MG/2ML IJ SOLN: 0.5000 mg | Freq: Once | INTRAMUSCULAR | Status: DC | PRN

## 2016-02-23 MED ORDER — ATORVASTATIN CALCIUM 20 MG PO TABS: 80.0000 mg | ORAL_TABLET | Freq: Every day | ORAL | Status: DC

## 2016-02-23 MED ORDER — CEFAZOLIN SODIUM-DEXTROSE 2-4 GM/100ML-% IV SOLN
INTRAVENOUS | Status: AC
  Filled 2016-02-23: qty 100

## 2016-02-23 MED ORDER — ACETAMINOPHEN 10 MG/ML IV SOLN
INTRAVENOUS | Status: AC
  Filled 2016-02-23: qty 100

## 2016-02-23 MED ORDER — SENNA 8.6 MG PO TABS
2.0000 | ORAL_TABLET | Freq: Every day | ORAL | Status: DC
  Administered 2016-02-23: 17.2 mg via ORAL
  Filled 2016-02-23: qty 2

## 2016-02-23 MED ORDER — HYDROGEN PEROXIDE 3 % EX SOLN
CUTANEOUS | Status: DC | PRN
  Administered 2016-02-23: 1

## 2016-02-23 MED ORDER — CALCIUM CARBONATE-VITAMIN D 500-200 MG-UNIT PO TABS
1.0000 | ORAL_TABLET | Freq: Every day | ORAL | Status: DC
  Administered 2016-02-24: 1 via ORAL
  Filled 2016-02-23: qty 1

## 2016-02-23 MED ORDER — ACETAMINOPHEN 325 MG PO TABS: 650.0000 mg | ORAL_TABLET | Freq: Four times a day (QID) | ORAL | Status: DC | PRN

## 2016-02-23 MED ORDER — PANTOPRAZOLE SODIUM 40 MG PO TBEC
40.0000 mg | DELAYED_RELEASE_TABLET | Freq: Every day | ORAL | Status: DC
  Administered 2016-02-23 – 2016-02-24 (×2): 40 mg via ORAL
  Filled 2016-02-23 (×2): qty 1

## 2016-02-23 MED ORDER — NITROGLYCERIN 0.4 MG SL SUBL: 0.4000 mg | SUBLINGUAL_TABLET | SUBLINGUAL | Status: DC | PRN

## 2016-02-23 MED ORDER — CEFAZOLIN SODIUM-DEXTROSE 2-4 GM/100ML-% IV SOLN
2.0000 g | INTRAVENOUS | Status: AC
  Administered 2016-02-23: 2 g via INTRAVENOUS
  Filled 2016-02-23: qty 100

## 2016-02-23 MED ORDER — PHENOL 1.4 % MT LIQD: 1.0000 | OROMUCOSAL | Status: DC | PRN

## 2016-02-23 MED ORDER — MECLIZINE HCL 25 MG PO TABS
25.0000 mg | ORAL_TABLET | Freq: Two times a day (BID) | ORAL | Status: DC | PRN
  Filled 2016-02-23: qty 1

## 2016-02-23 MED ORDER — METOCLOPRAMIDE HCL 5 MG PO TABS: 5.0000 mg | ORAL_TABLET | Freq: Three times a day (TID) | ORAL | Status: DC | PRN

## 2016-02-23 MED ORDER — METHOCARBAMOL 1000 MG/10ML IJ SOLN
500.0000 mg | Freq: Four times a day (QID) | INTRAMUSCULAR | Status: DC | PRN
  Administered 2016-02-23: 500 mg via INTRAVENOUS
  Filled 2016-02-23: qty 5
  Filled 2016-02-23: qty 550

## 2016-02-23 MED ORDER — FUROSEMIDE 20 MG PO TABS: 20.0000 mg | ORAL_TABLET | Freq: Every day | ORAL | Status: DC

## 2016-02-23 MED ORDER — WATER FOR IRRIGATION, STERILE IR SOLN
Status: DC | PRN
  Administered 2016-02-23: 2000 mL

## 2016-02-23 MED ORDER — LOSARTAN POTASSIUM 50 MG PO TABS: 50.0000 mg | ORAL_TABLET | Freq: Every day | ORAL | Status: DC

## 2016-02-23 MED ORDER — POTASSIUM CHLORIDE CRYS ER 20 MEQ PO TBCR
20.0000 meq | EXTENDED_RELEASE_TABLET | Freq: Every day | ORAL | Status: DC
  Administered 2016-02-23: 20 meq via ORAL
  Filled 2016-02-23: qty 1

## 2016-02-23 MED ORDER — PROPOFOL 10 MG/ML IV BOLUS
INTRAVENOUS | Status: AC
  Filled 2016-02-23: qty 40

## 2016-02-23 MED ORDER — FENTANYL CITRATE (PF) 100 MCG/2ML IJ SOLN
INTRAMUSCULAR | Status: DC | PRN
  Administered 2016-02-23 (×2): 50 ug via INTRAVENOUS

## 2016-02-23 MED ORDER — CEFAZOLIN SODIUM-DEXTROSE 2-4 GM/100ML-% IV SOLN
2.0000 g | Freq: Four times a day (QID) | INTRAVENOUS | Status: AC
  Administered 2016-02-23 – 2016-02-24 (×2): 2 g via INTRAVENOUS
  Filled 2016-02-23 (×2): qty 100

## 2016-02-23 MED ORDER — SALINE SPRAY 0.65 % NA SOLN: 1.0000 | NASAL | Status: DC | PRN

## 2016-02-23 MED ORDER — KETOROLAC TROMETHAMINE 30 MG/ML IJ SOLN
30.0000 mg | Freq: Once | INTRAMUSCULAR | Status: AC
  Administered 2016-02-23: 30 mg via INTRAMUSCULAR

## 2016-02-23 MED ORDER — ALBUMIN HUMAN 5 % IV SOLN
INTRAVENOUS | Status: AC
  Filled 2016-02-23: qty 250

## 2016-02-23 MED ORDER — ASPIRIN 81 MG PO CHEW
81.0000 mg | CHEWABLE_TABLET | Freq: Two times a day (BID) | ORAL | Status: DC
  Administered 2016-02-23 – 2016-02-24 (×2): 81 mg via ORAL
  Filled 2016-02-23 (×2): qty 1

## 2016-02-23 MED ORDER — MIDAZOLAM HCL 5 MG/5ML IJ SOLN
INTRAMUSCULAR | Status: DC | PRN
  Administered 2016-02-23: 2 mg via INTRAVENOUS

## 2016-02-23 MED ORDER — HYDROGEN PEROXIDE 3 % EX SOLN
CUTANEOUS | Status: AC
  Filled 2016-02-23: qty 473

## 2016-02-23 MED ORDER — BUPIVACAINE-EPINEPHRINE (PF) 0.25% -1:200000 IJ SOLN
INTRAMUSCULAR | Status: AC
  Filled 2016-02-23: qty 30

## 2016-02-23 MED ORDER — POVIDONE-IODINE 10 % EX SWAB: 2.0000 "application " | Freq: Once | CUTANEOUS | Status: DC

## 2016-02-23 MED ORDER — SALINE NASAL SPRAY 0.65 % NA SOLN: 1.0000 | NASAL | Status: DC | PRN

## 2016-02-23 MED ORDER — HYDROMORPHONE HCL 1 MG/ML IJ SOLN
0.2500 mg | INTRAMUSCULAR | Status: DC | PRN
  Administered 2016-02-23 (×3): 0.5 mg via INTRAVENOUS

## 2016-02-23 MED ORDER — FLUTICASONE PROPIONATE 50 MCG/ACT NA SUSP
1.0000 | Freq: Every day | NASAL | Status: DC
  Administered 2016-02-23: 1 via NASAL
  Filled 2016-02-23: qty 16

## 2016-02-23 MED ORDER — PROPOFOL 500 MG/50ML IV EMUL
INTRAVENOUS | Status: DC | PRN
  Administered 2016-02-23: 125 ug/kg/min via INTRAVENOUS

## 2016-02-23 MED ORDER — KETOROLAC TROMETHAMINE 15 MG/ML IJ SOLN
7.5000 mg | Freq: Four times a day (QID) | INTRAMUSCULAR | Status: DC
  Administered 2016-02-23 – 2016-02-24 (×3): 7.5 mg via INTRAVENOUS
  Filled 2016-02-23 (×3): qty 1

## 2016-02-23 MED ORDER — SODIUM CHLORIDE 0.9 % IJ SOLN
INTRAMUSCULAR | Status: AC
  Filled 2016-02-23: qty 50

## 2016-02-23 MED ORDER — LACTATED RINGERS IV SOLN
INTRAVENOUS | Status: DC
  Administered 2016-02-23 (×2): via INTRAVENOUS
  Administered 2016-02-23: 1000 mL via INTRAVENOUS

## 2016-02-23 MED ORDER — ACETAMINOPHEN 650 MG RE SUPP: 650.0000 mg | Freq: Four times a day (QID) | RECTAL | Status: DC | PRN

## 2016-02-23 MED ORDER — ACETAMINOPHEN 10 MG/ML IV SOLN
1000.0000 mg | INTRAVENOUS | Status: AC
  Administered 2016-02-23: 1000 mg via INTRAVENOUS
  Filled 2016-02-23: qty 100

## 2016-02-23 MED ORDER — SODIUM CHLORIDE 0.9 % IJ SOLN
INTRAMUSCULAR | Status: DC | PRN
  Administered 2016-02-23: 30 mL

## 2016-02-23 MED ORDER — MEPERIDINE HCL 50 MG/ML IJ SOLN: 6.2500 mg | INTRAMUSCULAR | Status: DC | PRN

## 2016-02-23 MED ORDER — ISOPROPYL ALCOHOL 70 % SOLN
Status: DC | PRN
  Administered 2016-02-23: 1 via TOPICAL

## 2016-02-23 MED ORDER — SODIUM CHLORIDE 0.9 % IR SOLN
Status: DC | PRN
  Administered 2016-02-23: 1000 mL

## 2016-02-23 MED ORDER — METOCLOPRAMIDE HCL 5 MG/ML IJ SOLN: 5.0000 mg | Freq: Three times a day (TID) | INTRAMUSCULAR | Status: DC | PRN

## 2016-02-23 MED ORDER — ONDANSETRON HCL 4 MG/2ML IJ SOLN: 4.0000 mg | Freq: Four times a day (QID) | INTRAMUSCULAR | Status: DC | PRN

## 2016-02-23 MED ORDER — MENTHOL 3 MG MT LOZG: 1.0000 | LOZENGE | OROMUCOSAL | Status: DC | PRN

## 2016-02-23 MED ORDER — PROPOFOL 10 MG/ML IV BOLUS
INTRAVENOUS | Status: DC | PRN
  Administered 2016-02-23 (×4): 10 mg via INTRAVENOUS

## 2016-02-23 MED ORDER — HYDROCODONE-ACETAMINOPHEN 5-325 MG PO TABS
1.0000 | ORAL_TABLET | ORAL | Status: DC | PRN
  Administered 2016-02-24: 2 via ORAL
  Administered 2016-02-24: 1 via ORAL
  Filled 2016-02-23: qty 2
  Filled 2016-02-23: qty 1

## 2016-02-23 MED ORDER — TRANEXAMIC ACID 1000 MG/10ML IV SOLN
1000.0000 mg | INTRAVENOUS | Status: AC
  Administered 2016-02-23: 1000 mg via INTRAVENOUS
  Filled 2016-02-23: qty 10

## 2016-02-23 SURGICAL SUPPLY — 41 items
BAG DECANTER FOR FLEXI CONT (MISCELLANEOUS) IMPLANT
BAG SPEC THK2 15X12 ZIP CLS (MISCELLANEOUS)
BAG ZIPLOCK 12X15 (MISCELLANEOUS) IMPLANT
CAPT HIP TOTAL 2 ×1 IMPLANT
CHLORAPREP W/TINT 26ML (MISCELLANEOUS) ×2 IMPLANT
CLOTH BEACON ORANGE TIMEOUT ST (SAFETY) ×2 IMPLANT
COVER PERINEAL POST (MISCELLANEOUS) ×2 IMPLANT
DECANTER SPIKE VIAL GLASS SM (MISCELLANEOUS) ×2 IMPLANT
DRAPE SHEET LG 3/4 BI-LAMINATE (DRAPES) ×4 IMPLANT
DRAPE STERI IOBAN 125X83 (DRAPES) ×2 IMPLANT
DRAPE U-SHAPE 47X51 STRL (DRAPES) ×4 IMPLANT
DRSG AQUACEL AG ADV 3.5X10 (GAUZE/BANDAGES/DRESSINGS) ×2 IMPLANT
ELECT REM PT RETURN 15FT ADLT (MISCELLANEOUS) ×2 IMPLANT
GAUZE SPONGE 4X4 12PLY STRL (GAUZE/BANDAGES/DRESSINGS) ×2 IMPLANT
GLOVE BIO SURGEON STRL SZ8.5 (GLOVE) ×4 IMPLANT
GLOVE BIOGEL PI IND STRL 8.5 (GLOVE) ×1 IMPLANT
GLOVE BIOGEL PI INDICATOR 8.5 (GLOVE) ×1
GOWN SPEC L3 XXLG W/TWL (GOWN DISPOSABLE) ×2 IMPLANT
HANDPIECE INTERPULSE COAX TIP (DISPOSABLE) ×2
HOLDER FOLEY CATH W/STRAP (MISCELLANEOUS) ×2 IMPLANT
HOOD PEEL AWAY FLYTE STAYCOOL (MISCELLANEOUS) ×4 IMPLANT
LIQUID BAND (GAUZE/BANDAGES/DRESSINGS) ×4 IMPLANT
MARKER SKIN DUAL TIP RULER LAB (MISCELLANEOUS) ×2 IMPLANT
NDL SPNL 18GX3.5 QUINCKE PK (NEEDLE) ×1 IMPLANT
NEEDLE SPNL 18GX3.5 QUINCKE PK (NEEDLE) ×2 IMPLANT
PACK ANTERIOR HIP CUSTOM (KITS) ×2 IMPLANT
SAW OSC TIP CART 19.5X105X1.3 (SAW) ×2 IMPLANT
SEALER BIPOLAR AQUA 6.0 (INSTRUMENTS) ×2 IMPLANT
SET HNDPC FAN SPRY TIP SCT (DISPOSABLE) ×1 IMPLANT
SOL PREP POV-IOD 4OZ 10% (MISCELLANEOUS) ×2 IMPLANT
SUT ETHIBOND NAB CT1 #1 30IN (SUTURE) ×4 IMPLANT
SUT MNCRL AB 3-0 PS2 18 (SUTURE) ×2 IMPLANT
SUT MON AB 2-0 CT1 36 (SUTURE) ×4 IMPLANT
SUT VIC AB 1 CT1 36 (SUTURE) ×2 IMPLANT
SUT VIC AB 2-0 CT1 27 (SUTURE) ×2
SUT VIC AB 2-0 CT1 TAPERPNT 27 (SUTURE) ×1 IMPLANT
SUT VLOC 180 0 24IN GS25 (SUTURE) ×2 IMPLANT
SYR 50ML LL SCALE MARK (SYRINGE) ×2 IMPLANT
TRAY FOLEY W/METER SILVER 14FR (SET/KITS/TRAYS/PACK) ×1 IMPLANT
WATER STERILE IRR 1500ML POUR (IV SOLUTION) ×2 IMPLANT
YANKAUER SUCT BULB TIP 10FT TU (MISCELLANEOUS) ×2 IMPLANT

## 2016-02-23 NOTE — Discharge Summary (Signed)
Physician Discharge Summary  Patient ID: Sara [name] MRN: [id number] DOB/AGE: 71/20/1947 71 y.o.  Admit date: 02/23/2016 Discharge date: 02/24/2016  Admission Diagnoses:  Primary osteoarthritis of right hip  Discharge Diagnoses:  Principal Problem:   Primary osteoarthritis of right hip   Past Medical History:  Diagnosis Date  . Arthritis    Arthritis -hands -Bil. knee replacemnts  . AVNRT (AV nodal re-entry tachycardia) ([place])    s/p RFCA 09/2011  . Bronchitis    none recent  . CAD (coronary artery disease)    a. s/p Xience DES x 3 to LAD;  b. nuc study 02/27/10: EF 67% no ischemia;  c.  echo 11/12: Mild LVH, EF [id number], grade 1 diastolic dysfunction, moderate LAE;  d.  LHC 07/03/11: LAD stents patent, RCA 25%, EF 55-65% ;  e. [place]. Myoview 5/14:  No ischemia, EF 68%  . Diverticular disease   . GERD (gastroesophageal reflux disease)   . Hemorrhoid   . Hepatitis    yellow jaundice as a child   . HTN (hypertension)   . Hx of echocardiogram    a.  Echo (05/2011):  Mild LVH, EF 55-60%, Gr 1 DD, mod LAE.b. Echo (11/14):  Mild LVH, mild focal basal septal hypertrophy, EF 55-60%, Gr 1 DD, mild LAE, atrial septal aneurysm  . Hyperlipidemia   . Hypertension   . Impaired hearing    bilateral- no hearing aids  . Jaundice   . Myocardial infarction (HCC)    hx of x 2   . Obesity   . OSA (obstructive sleep apnea)    needs no cpap per patient   . Other psoriasis    ongoing- none at present.  . Shortness of breath    Exertional  . Vertigo     Surgeries: Procedure(s): RIGHT TOTAL HIP ARTHROPLASTY ANTERIOR APPROACH on 02/23/2016   Consultants (if any):   Discharged Condition: Improved  Hospital Course: Sara [name] is an 70 y.o. female who was admitted 02/23/2016 with a diagnosis of Primary osteoarthritis of right hip and went to the operating room on 02/23/2016 and underwent the above named procedures.    She was given perioperative antibiotics:  Anti-infectives    Start      Dose/Rate Route Frequency Ordered Stop   02/24/16 0600  ceFAZolin (ANCEF) IVPB 2g/100 mL premix     2 g 200 mL/hr over 30 Minutes Intravenous On call to O.R. 02/23/16 1339 02/23/16 1642   02/23/16 2300  ceFAZolin (ANCEF) IVPB 2g/100 mL premix     2 g 200 mL/hr over 30 Minutes Intravenous Every 6 hours 02/23/16 2013 02/24/16 0537    .  She was given sequential compression devices, early ambulation, and ASA for DVT prophylaxis.  She benefited maximally from the hospital stay and there were no complications.    Recent vital signs:  Vitals:   02/24/16 0154 02/24/16 0605  BP: 116/60 (!) 115/59  Pulse: 72 84  Resp: 16 16  Temp: 97.5 F (36.4 C) 98.1 F (36.7 C)    Recent laboratory studies:  Lab Results  Component Value Date   HGB 10.5 (L) 02/24/2016   HGB 15.0 02/17/2016   HGB 14.2 02/03/2015   Lab Results  Component Value Date   WBC 6.8 02/24/2016   PLT 137 (L) 02/24/2016   Lab Results  Component Value Date   INR 1.1 (H) 08/11/2013   Lab Results  Component Value Date   NA 135 02/24/2016   K 4.3 02/24/2016   CL  107 02/24/2016   CO2 23 02/24/2016   BUN 15 02/24/2016   CREATININE 0.68 02/24/2016   GLUCOSE 203 (H) 02/24/2016    Discharge Medications:     Medication List    STOP taking these medications   acetaminophen 500 MG tablet Commonly known as:  TYLENOL   aspirin EC 81 MG tablet Replaced by:  aspirin 81 MG chewable tablet   traMADol 50 MG tablet Commonly known as:  ULTRAM     TAKE these medications   aspirin 81 MG chewable tablet Chew 1 tablet (81 mg total) by mouth 2 (two) times daily. Replaces:  aspirin EC 81 MG tablet   atorvastatin 80 MG tablet Commonly known as:  LIPITOR Take 80 mg by mouth at bedtime.   diltiazem 120 MG 24 hr capsule Commonly known as:  CARDIZEM CD TAKE (1) CAPSULE DAILY What changed:  See the new instructions.   docusate sodium 100 MG capsule Commonly known as:  COLACE Take 1 capsule (100 mg total) by mouth 2  (two) times daily.   fluticasone 50 MCG/ACT nasal spray Commonly known as:  FLONASE Place 1 spray into both nostrils 2 (two) times daily as needed for allergies or rhinitis. What changed:  when to take this   furosemide 20 MG tablet Commonly known as:  LASIX Take 1 tablet (20 mg total) by mouth daily.   HYDROcodone-acetaminophen 5-325 MG tablet Commonly known as:  NORCO Take 1-2 tablets by mouth every 4 (four) hours as needed for moderate pain.   losartan 50 MG tablet Commonly known as:  COZAAR TAKE 1 TABLET DAILY What changed:  See the new instructions.   meclizine 25 MG tablet Commonly known as:  ANTIVERT Take 25 mg by mouth 2 (two) times daily as needed for dizziness. Reported on 12/05/2015   methocarbamol 500 MG tablet Commonly known as:  ROBAXIN Take 1 tablet (500 mg total) by mouth every 6 (six) hours as needed for muscle spasms.   nitroGLYCERIN 0.4 MG SL tablet Commonly known as:  NITROSTAT Place 0.4 mg under the tongue every 5 (five) minutes as needed for chest pain (x 3 doses). Reported on 12/05/2015   omeprazole 20 MG capsule Commonly known as:  PRILOSEC TAKE (1) CAPSULE DAILY What changed:  See the new instructions.   ondansetron 4 MG tablet Commonly known as:  ZOFRAN Take 1 tablet (4 mg total) by mouth every 6 (six) hours as needed for nausea.   potassium chloride 10 MEQ tablet Commonly known as:  K-DUR,KLOR-CON Take 1 tablet (10 mEq total) by mouth daily. What changed:  how much to take  how to take this  when to take this  additional instructions   senna 8.6 MG Tabs tablet Commonly known as:  SENOKOT Take 2 tablets (17.2 mg total) by mouth at bedtime.   sodium chloride 0.65 % nasal spray Commonly known as:  OCEAN Place 1 spray into the nose at bedtime as needed for congestion.   spironolactone 25 MG tablet Commonly known as:  ALDACTONE TAKE (1/2) TABLET TWICE DAILY. What changed:  See the new instructions.   SUPER CALCIUM 600 + D3  PO Take 1 tablet by mouth daily.       Diagnostic Studies: Dg Pelvis Portable  Result Date: 02/23/2016 CLINICAL DATA:  Post right hip arthroplasty. EXAM: PORTABLE PELVIS 1-2 VIEWS COMPARISON:  None. FINDINGS: There has been a prior total right hip arthroplasty with screwed in acetabular component. Alignment is near anatomic. No fractures visualized. Expected soft tissue postsurgical  emphysema noted. IMPRESSION: Post total right hip arthroplasty without evidence of immediate complications. Electronically Signed   By: Fidela Salisbury M.D.   On: 02/23/2016 19:31   Dg C-arm 61-120 Min-no Report  Result Date: 02/23/2016 CLINICAL DATA: surgery C-ARM 61-120 MINUTES Fluoroscopy was utilized by the requesting physician.  No radiographic interpretation.   Dg Hip Operative Unilat With Pelvis Right  Result Date: 02/23/2016 CLINICAL DATA:  Operative imaging for right hip arthroplasty. EXAM: DG C-ARM 61-120 MIN-NO REPORT; OPERATIVE RIGHT HIP WITH PELVIS COMPARISON:  None. FLUOROSCOPY TIME:  Radiation Exposure Index (as provided by the fluoroscopic device): 3.64 mGy If the device does not provide the exposure index: Fluoroscopy Time:  0 minutes and 22 seconds Number of Acquired Images:  2 FINDINGS: The femoral acetabular components of the total right hip arthroplasty appear well seated well aligned. No evidence of an acute fracture or operative complication. IMPRESSION: Well-positioned right total hip arthroplasty. Electronically Signed   By: Lajean Manes M.D.   On: 02/23/2016 19:00    Disposition: 01-Home or Self Care  Discharge Instructions    Call MD / Call 911    Complete by:  As directed   If you experience chest pain or shortness of breath, CALL 911 and be transported to the hospital emergency room.  If you develope a fever above 101 F, pus (white drainage) or increased drainage or redness at the wound, or calf pain, call your surgeon's office.   Constipation Prevention    Complete by:  As directed    Drink plenty of fluids.  Prune juice may be helpful.  You may use a stool softener, such as Colace (over the counter) 100 mg twice a day.  Use MiraLax (over the counter) for constipation as needed.   Diet - low sodium heart healthy    Complete by:  As directed   Driving restrictions    Complete by:  As directed   No driving for 6 weeks   Increase activity slowly as tolerated    Complete by:  As directed   Lifting restrictions    Complete by:  As directed   No lifting for 6 weeks   TED hose    Complete by:  As directed   Use stockings (TED hose) for 2 weeks on both leg(s).  You may remove them at night for sleeping.      Follow-up Information    Nasha Diss, Horald Pollen, MD. Schedule an appointment as soon as possible for a visit in 2 weeks.   Specialty:  Orthopedic Surgery Why:  For wound re-check Contact information: [address]. [address] [address] [phone]            Signed: Elie Goody 02/24/2016, 10:22 AM

## 2016-02-23 NOTE — Anesthesia Procedure Notes (Signed)
Spinal  Patient location during procedure: OR Start time: 02/23/2016 4:32 PM End time: 02/23/2016 4:36 PM Staffing Anesthesiologist: Annye Asa Resident/CRNA: Darlys Gales R Performed: resident/CRNA  Preanesthetic Checklist Completed: patient identified, site marked, surgical consent, pre-op evaluation, timeout performed, IV checked, risks and benefits discussed and monitors and equipment checked Spinal Block Patient position: sitting Prep: Betadine Patient monitoring: heart rate, continuous pulse ox and blood pressure Approach: midline Location: L3-4 Injection technique: single-shot Needle Needle type: Spinocan  Needle gauge: 22 G Needle length: 9 cm Needle insertion depth: 7.5 cm Assessment Sensory level: T6 Additional Notes Expiration date of kit checked and confirmed. Patient tolerated procedure well, without complications.

## 2016-02-23 NOTE — Discharge Instructions (Signed)
°Dr. Crystale Giannattasio °Joint Replacement Specialist °[facility] °[address] °[place], [place] [address] °[phone] ° ° °TOTAL HIP REPLACEMENT POSTOPERATIVE DIRECTIONS ° ° ° °Hip Rehabilitation, Guidelines Following Surgery  ° °WEIGHT BEARING °Weight bearing as tolerated with assist device (walker, cane, etc) as directed, use it as long as suggested by your surgeon or therapist, typically at least 4-6 weeks. ° °The results of a hip operation are greatly improved after range of motion and muscle strengthening exercises. Follow all safety measures which are given to protect your hip. If any of these exercises cause increased pain or swelling in your joint, decrease the amount until you are comfortable again. Then slowly increase the exercises. Call your caregiver if you have problems or questions.  ° °HOME CARE INSTRUCTIONS  °Most of the following instructions are designed to prevent the dislocation of your new hip.  °Remove items at home which could result in a fall. This includes throw rugs or furniture in walking pathways.  °Continue medications as instructed at time of discharge. °· You may have some home medications which will be placed on hold until you complete the course of blood thinner medication. °· You may start showering once you are discharged home. Do not remove your dressing. °Do not put on socks or shoes without following the instructions of your caregivers.   °Sit on chairs with arms. Use the chair arms to help push yourself up when arising.  °Arrange for the use of a toilet seat elevator so you are not sitting low.  °· Walk with walker as instructed.  °You may resume a sexual relationship in one month or when given the OK by your caregiver.  °Use walker as long as suggested by your caregivers.  °You may put full weight on your legs and walk as much as is comfortable. °Avoid periods of inactivity such as sitting longer than an hour when not asleep. This helps prevent  blood clots.  °You may return to work once you are cleared by your surgeon.  °Do not drive a car for 6 weeks or until released by your surgeon.  °Do not drive while taking narcotics.  °Wear elastic stockings for two weeks following surgery during the day but you may remove then at night.  °Make sure you keep all of your appointments after your operation with all of your doctors and caregivers. You should call the office at the above phone number and make an appointment for approximately two weeks after the date of your surgery. °Please pick up a stool softener and laxative for home use as long as you are requiring pain medications. °· ICE to the affected hip every three hours for 30 minutes at a time and then as needed for pain and swelling. Continue to use ice on the hip for pain and swelling from surgery. You may notice swelling that will progress down to the foot and ankle.  This is normal after surgery.  Elevate the leg when you are not up walking on it.   °It is important for you to complete the blood thinner medication as prescribed by your doctor. °· Continue to use the breathing machine which will help keep your temperature down.  It is common for your temperature to cycle up and down following surgery, especially at night when you are not up moving around and exerting yourself.  The breathing machine keeps your lungs expanded and your temperature down. ° °RANGE OF MOTION AND STRENGTHENING EXERCISES  °These exercises are   designed to help you keep full movement of your hip joint. Follow your caregiver's or physical therapist's instructions. Perform all exercises about fifteen times, three times per day or as directed. Exercise both hips, even if you have had only one joint replacement. These exercises can be done on a training (exercise) mat, on the floor, on a table or on a bed. Use whatever works the best and is most comfortable for you. Use music or television while you are exercising so that the exercises  are a pleasant break in your day. This will make your life better with the exercises acting as a break in routine you can look forward to.  °Lying on your back, slowly slide your foot toward your buttocks, raising your knee up off the floor. Then slowly slide your foot back down until your leg is straight again.  °Lying on your back spread your legs as far apart as you can without causing discomfort.  °Lying on your side, raise your upper leg and foot straight up from the floor as far as is comfortable. Slowly lower the leg and repeat.  °Lying on your back, tighten up the muscle in the front of your thigh (quadriceps muscles). You can do this by keeping your leg straight and trying to raise your heel off the floor. This helps strengthen the largest muscle supporting your knee.  °Lying on your back, tighten up the muscles of your buttocks both with the legs straight and with the knee bent at a comfortable angle while keeping your heel on the floor.  ° °SKILLED REHAB INSTRUCTIONS: °If the patient is transferred to a skilled rehab facility following release from the hospital, a list of the current medications will be sent to the facility for the patient to continue.  When discharged from the skilled rehab facility, please have the facility set up the patient's Home Health Physical Therapy prior to being released. Also, the skilled facility will be responsible for providing the patient with their medications at time of release from the facility to include their pain medication and their blood thinner medication. If the patient is still at the rehab facility at time of the two week follow up appointment, the skilled rehab facility will also need to assist the patient in arranging follow up appointment in our office and any transportation needs. ° °MAKE SURE YOU:  °Understand these instructions.  °Will watch your condition.  °Will get help right away if you are not doing well or get worse. ° °Pick up stool softner and  laxative for home use following surgery while on pain medications. °Do not remove your dressing. °The dressing is waterproof--it is OK to take showers. °Continue to use ice for pain and swelling after surgery. °Do not use any lotions or creams on the incision until instructed by your surgeon. °Total Hip Protocol. ° ° °

## 2016-02-23 NOTE — Transfer of Care (Signed)
Immediate Anesthesia Transfer of Care Note  Patient: Sara [name]  Procedure(s) Performed: Procedure(s): RIGHT TOTAL HIP ARTHROPLASTY ANTERIOR APPROACH (Right)  Patient Location: PACU  Anesthesia Type:Spinal  Level of Consciousness:  sedated, patient cooperative and responds to stimulation  Airway & Oxygen Therapy:Patient Spontanous Breathing and Patient connected to face mask oxgen  Post-op Assessment:  Report given to PACU RN and Post -op Vital signs reviewed and stable  Post vital signs:  Reviewed and stable  Last Vitals:  Vitals:   02/23/16 1335  Pulse: 72  Resp: 18  Temp: [id number] C    Complications: No apparent anesthesia complications

## 2016-02-23 NOTE — Interval H&P Note (Signed)
History and Physical Interval Note:  02/23/2016 3:53 PM  Sara [name]  has presented today for surgery, with the diagnosis of DJD RIGHT HIP  The various methods of treatment have been discussed with the patient and family. After consideration of risks, benefits and other options for treatment, the patient has consented to  Procedure(s): RIGHT TOTAL HIP ARTHROPLASTY ANTERIOR APPROACH (Right) as a surgical intervention .  The patient's history has been reviewed, patient examined, no change in status, stable for surgery.  I have reviewed the patient's chart and labs.  Questions were answered to the patient's satisfaction.     Mckinnley Smithey, Horald Pollen

## 2016-02-23 NOTE — Op Note (Signed)
OPERATIVE REPORT  SURGEON: Rod Can, MD   ASSISTANT: Wyatt Portela, PA-C.  PREOPERATIVE DIAGNOSIS: Right hip arthritis.   POSTOPERATIVE DIAGNOSIS: Right hip arthritis.   PROCEDURE: Right total hip arthroplasty, anterior approach.   IMPLANTS: DePuy Tri Lock stem, size 4, hi offset. DePuy Pinnacle Cup, size 52 mm. DePuy Altrx liner, size 36 by 52 mm, +4 neutral. DePuy Biolox ceramic head ball, size 36 + 1.5 mm.  ANESTHESIA:  Spinal  ESTIMATED BLOOD LOSS: 1,000 mL.  ANTIBIOTICS: 2 g Ancef.  DRAINS: None.  COMPLICATIONS: None.   CONDITION: PACU - hemodynamically stable.   BRIEF CLINICAL NOTE: Sara [name] is a 70 y.o. female with a long-standing history of Right hip arthritis. After failing conservative management, the patient was indicated for total hip arthroplasty. The risks, benefits, and alternatives to the procedure were explained, and the patient elected to proceed.  PROCEDURE IN DETAIL: Surgical site was marked by myself. Once inside the operative room, spinal anesthesia was obtained, and a foley catheter was inserted. The patient was then positioned on the Hana table. All bony prominences were well padded. The hip was prepped and draped in the normal sterile surgical fashion. A time-out was called verifying side and site of surgery. The patient received IV antibiotics within 60 minutes of beginning the procedure.  The direct anterior approach to the hip was performed through the Hueter interval. Lateral femoral circumflex vessels were treated with the Auqumantys. The anterior capsule was exposed and an inverted T capsulotomy was made.The femoral neck cut was made to the level of the templated cut. A corkscrew was placed into the head and the head was removed. The femoral head was found to have eburnated bone. The head was passed to the back table and was measured.  Acetabular exposure was achieved, and the pulvinar and labrum were excised. Sequental  reaming of the acetabulum was then performed up to a size 51 mm reamer. A 52 mm cup was then opened and impacted into place at approximately 40 degrees of abduction and 20 degrees of anteversion. The cup achieved an acceptable press-fit, and I chose to augment the stability with a single 6.5 mm cancellous bone screw. The final polyethylene liner was impacted into place and acetabular osteophytes were removed.   I then gained femoral exposure taking care to protect the abductors and greater trochanter. This was performed using standard external rotation, extension, and adduction. The capsule was peeled off the inner aspect of the greater trochanter, taking care to preserve the short external rotators. A cookie cutter was used to enter the femoral canal, and then the femoral canal finder was placed. Sequential broaching was performed up to a size 4. Calcar planer was used on the femoral neck remnant. I placed a hi offset neck and a trial head ball. The hip was reduced. Leg lengths and offset were checked fluoroscopically. The hip was dislocated and trial components were removed. The final implants were placed, and the hip was reduced.  Fluoroscopy was used to confirm component position and leg lengths. At 90 degrees of external rotation and full extension, the hip was stable to an anterior directed force.  The wound was copiously irrigated with a dilute betadine solution followed by normal saline. Marcaine solution was injected into the periarticular soft tissue. The wound was closed in layers using #1 Vicryl and V-Loc for the fascia, 2-0 Vicryl for the subcutaneous fat, 2-0 Monocryl for the deep dermal layer, 3-0 running Monocryl subcuticular stitch, and Dermabond for the skin. Once  the glue was fully dried, an Aquacell Ag dressing was applied. The patient was transported to the recovery room in stable condition. Sponge, needle, and instrument counts were correct at the end of the case x2. The  patient tolerated the procedure well and there were no known complications.  Please note that a surgical assistant was a medical necessity for this procedure to perform it in a safe and expeditious manner. Assistant was necessary to provide appropriate retraction of vital neurovascular structures, to prevent femoral fracture, and to allow for anatomic placement of the prosthesis.

## 2016-02-23 NOTE — Anesthesia Preprocedure Evaluation (Addendum)
Anesthesia Evaluation  Patient identified by MRN, date of birth, ID band Patient awake    Reviewed: Allergy & Precautions, NPO status , Patient's Chart, lab work & pertinent test results  History of Anesthesia Complications Negative for: history of anesthetic complications  Airway Mallampati: II  TM Distance: >3 FB Neck ROM: Full    Dental  (+) Dental Advisory Given, Caps   Pulmonary sleep apnea (no CPAP) ,    breath sounds clear to auscultation       Cardiovascular hypertension, Pt. on medications (-) angina+ CAD, + Past MI and + Cardiac Stents  + dysrhythmias Supra Ventricular Tachycardia  Rhythm:Regular Rate:Normal  Myoview 5/14:  No ischemia, EF 68%   Neuro/Psych negative neurological ROS     GI/Hepatic GERD  Medicated and Controlled,  Endo/Other  Morbid obesity  Renal/GU negative Renal ROS     Musculoskeletal  (+) Arthritis , Osteoarthritis,    Abdominal   Peds  Hematology negative hematology ROS (+)   Anesthesia Other Findings   Reproductive/Obstetrics                            Anesthesia Physical Anesthesia Plan  ASA: III  Anesthesia Plan: Spinal   Post-op Pain Management:    Induction: Intravenous  Airway Management Planned: Oral ETT  Additional Equipment:   Intra-op Plan:   Post-operative Plan: Extubation in OR  Informed Consent: I have reviewed the patients History and Physical, chart, labs and discussed the procedure including the risks, benefits and alternatives for the proposed anesthesia with the patient or authorized representative who has indicated his/her understanding and acceptance.   Dental advisory given  Plan Discussed with: CRNA and Surgeon  Anesthesia Plan Comments: (Plan routine monitors, SAB)        Anesthesia Quick Evaluation

## 2016-02-23 NOTE — H&P (View-Only) (Signed)
TOTAL HIP ADMISSION H&P  Patient is admitted for right total hip arthroplasty.  Subjective:  Chief Complaint: right hip pain  HPI: Sara [name], 70 y.o. female, has a history of pain and functional disability in the right hip(s) due to arthritis and patient has failed non-surgical conservative treatments for greater than 12 weeks to include NSAID's and/or analgesics, corticosteriod injections, flexibility and strengthening excercises, use of assistive devices, weight reduction as appropriate and activity modification.  Onset of symptoms was abrupt starting 1 years ago with rapidlly worsening course since that time.The patient noted no past surgery on the right hip(s).  Patient currently rates pain in the right hip at 10 out of 10 with activity. Patient has night pain, worsening of pain with activity and weight bearing, pain that interfers with activities of daily living, pain with passive range of motion and crepitus. Patient has evidence of subchondral cysts, subchondral sclerosis, periarticular osteophytes and joint space narrowing by imaging studies. This condition presents safety issues increasing the risk of falls. There is no current active infection.  Patient Active Problem List   Diagnosis Date Noted  . Hypokalemia 01/20/2015  . OA (osteoarthritis) of knee 12/22/2012  . AVNRT (AV nodal re-entry tachycardia) ([place])   . Atrial arrhythmia 07/03/2011  . OBSTRUCTIVE SLEEP APNEA 03/15/2010  . EDEMA 02/28/2009  . Hyperlipidemia 12/09/2008  . Essential hypertension 12/09/2008  . Coronary atherosclerosis 12/09/2008  . HEMORRHOIDS 12/09/2008  . GERD 12/09/2008  . PSORIASIS 12/09/2008  . Arthritis 12/09/2008  . VERTIGO 12/09/2008  . CARDIAC MURMUR 12/09/2008   Past Medical History:  Diagnosis Date  . Arthritis   . AVNRT (AV nodal re-entry tachycardia) ([place])    s/p RFCA 09/2011  . Bronchitis   . CAD (coronary artery disease)    a. s/p Xience DES x 3 to LAD;  b. nuc study 02/27/10: EF 67%  no ischemia;  c.  echo 11/12: Mild LVH, EF [id number], grade 1 diastolic dysfunction, moderate LAE;  d.  LHC 07/03/11: LAD stents patent, RCA 25%, EF 55-65% ;  e. [place]. Myoview 5/14:  No ischemia, EF 68%  . Diverticular disease   . GERD (gastroesophageal reflux disease)   . Hemorrhoid   . Hepatitis    yellow jaundice as a child   . HTN (hypertension)   . Hx of echocardiogram    a.  Echo (05/2011):  Mild LVH, EF 55-60%, Gr 1 DD, mod LAE.b. Echo (11/14):  Mild LVH, mild focal basal septal hypertrophy, EF 55-60%, Gr 1 DD, mild LAE, atrial septal aneurysm  . Hyperlipidemia   . Jaundice   . Myocardial infarction ([facility])    hx of x 2   . Obesity   . OSA (obstructive sleep apnea)    needs no cpap per patient   . Other psoriasis   . Shortness of breath   . Vertigo     Past Surgical History:  Procedure Laterality Date  . ABDOMINAL HYSTERECTOMY    . BACK SURGERY     Dr. Lawernce Pitts  . CARDIAC CATHETERIZATION  08/23/2011   Ablation AV  Node  . COLONOSCOPY    . CORONARY ANGIOPLASTY     with stents 2009   . KNEE SURGERY     right  . LEFT AND RIGHT HEART CATHETERIZATION WITH CORONARY ANGIOGRAM N/A 08/18/2013   Procedure: LEFT AND RIGHT HEART CATHETERIZATION WITH CORONARY ANGIOGRAM;  Surgeon: Larey Dresser, MD;  Location: [facility] CATH LAB;  Service: Cardiovascular;  Laterality: N/A;  . LEFT HEART CATHETERIZATION WITH  CORONARY ANGIOGRAM N/A 07/03/2011   Procedure: LEFT HEART CATHETERIZATION WITH CORONARY ANGIOGRAM;  Surgeon: Minus Breeding, MD;  Location: [facility] CATH LAB;  Service: Cardiovascular;  Laterality: N/A;  . SUPRAVENTRICULAR TACHYCARDIA ABLATION N/A 08/23/2011   Procedure: SUPRAVENTRICULAR TACHYCARDIA ABLATION;  Surgeon: Evans Lance, MD;  Location: [facility] CATH LAB;  Service: Cardiovascular;  Laterality: N/A;  . TOTAL KNEE ARTHROPLASTY Left 12/22/2012   Procedure: LEFT TOTAL KNEE ARTHROPLASTY;  Surgeon: Gearlean Alf, MD;  Location: [facility] ORS;  Service: Orthopedics;  Laterality: Left;  . TUBAL LIGATION     . UPPER GASTROINTESTINAL ENDOSCOPY       (Not in a hospital admission) Allergies  Allergen Reactions  . Tape     bleeding  . Codeine Itching  . Crestor [Rosuvastatin] Other (See Comments)    myalgias  . Nifedipine Other (See Comments)    Brings blood pressure up really fast PROCARDIA    Social History  Substance Use Topics  . Smoking status: Never Smoker  . Smokeless tobacco: Never Used  . Alcohol use No    Family History  Problem Relation Age of Onset  . Aneurysm Father   . Stroke Mother   . Hypertension Mother   . Heart failure Mother   . Seizures Brother   . Muscular dystrophy Daughter     dx as infant, passed age 71  . Colon cancer Neg Hx   . Seizures Son      Review of Systems  Constitutional: Negative.   HENT: Negative.   Eyes: Negative.   Respiratory: Positive for shortness of breath.   Cardiovascular: Positive for orthopnea. Negative for chest pain, palpitations, claudication, leg swelling and PND.  Gastrointestinal: Negative.   Genitourinary: Negative.   Musculoskeletal: Positive for back pain, joint pain and myalgias.  Skin: Negative.   Neurological: Negative.   Endo/Heme/Allergies: Negative.   Psychiatric/Behavioral: Negative.     Objective:  Physical Exam  Constitutional: She is oriented to person, place, and time. She appears well-developed and well-nourished.  HENT:  Head: Normocephalic and atraumatic.  Eyes: Conjunctivae and EOM are normal. Pupils are equal, round, and reactive to light.  Neck: Normal range of motion. Neck supple.  Cardiovascular: Normal rate, regular rhythm and intact distal pulses.   Respiratory: Effort normal and breath sounds normal. No respiratory distress.  GI: Soft. Bowel sounds are normal. She exhibits no distension.  Genitourinary:  Genitourinary Comments: deferred  Musculoskeletal:       Right hip: She exhibits decreased range of motion, decreased strength, tenderness and crepitus.  Neurological: She is alert  and oriented to person, place, and time. She has normal reflexes.  Skin: Skin is warm and dry.  Psychiatric: She has a normal mood and affect. Her behavior is normal. Judgment and thought content normal.    Vital signs in last 24 hours: @VSRANGES @  Labs:   Estimated body mass index is 42.33 kg/m as calculated from the following:   Height as of 02/02/16: 5\' 4"  (1.626 m).   Weight as of 02/02/16: 111.9 kg (246 lb 9.6 oz).   Imaging Review Plain radiographs demonstrate severe degenerative joint disease of the right hip(s). The bone quality appears to be adequate for age and reported activity level.  Assessment/Plan:  End stage arthritis, right hip(s)  The patient history, physical examination, clinical judgement of the provider and imaging studies are consistent with end stage degenerative joint disease of the right hip(s) and total hip arthroplasty is deemed medically necessary. The treatment options including medical management, injection  therapy, arthroscopy and arthroplasty were discussed at length. The risks and benefits of total hip arthroplasty were presented and reviewed. The risks due to aseptic loosening, infection, stiffness, dislocation/subluxation,  thromboembolic complications and other imponderables were discussed.  The patient acknowledged the explanation, agreed to proceed with the plan and consent was signed. Patient is being admitted for inpatient treatment for surgery, pain control, PT, OT, prophylactic antibiotics, VTE prophylaxis, progressive ambulation and ADL's and discharge planning.The patient is planning to be discharged home with home health services

## 2016-02-23 NOTE — Anesthesia Postprocedure Evaluation (Signed)
Anesthesia Post Note  Patient: Sara [name]  Procedure(s) Performed: Procedure(s) (LRB): RIGHT TOTAL HIP ARTHROPLASTY ANTERIOR APPROACH (Right)  Patient location during evaluation: PACU Anesthesia Type: Spinal Level of consciousness: oriented, awake and alert and patient cooperative Pain management: pain level controlled Vital Signs Assessment: post-procedure vital signs reviewed and stable Respiratory status: spontaneous breathing, respiratory function stable and nonlabored ventilation Cardiovascular status: blood pressure returned to baseline and stable Postop Assessment: no headache, no backache, patient able to bend at knees, no signs of nausea or vomiting and spinal receding Anesthetic complications: no    Last Vitals:  Vitals:   02/23/16 1930 02/23/16 1945  BP: 140/76 133/81  Pulse: 77 71  Resp: (!) 23 15  Temp:      Last Pain:  Vitals:   02/23/16 1945  TempSrc:   PainSc: 3         RLE Motor Response: Purposeful movement;Responds to commands (02/23/16 1945) RLE Sensation: Full sensation (02/23/16 1945) L Sensory Level: S1-Sole of foot, small toes (02/23/16 1945) R Sensory Level: S1-Sole of foot, small toes (02/23/16 1945)  Amarian Botero,E. Sho Salguero

## 2016-02-24 ENCOUNTER — Encounter (HOSPITAL_COMMUNITY): Payer: Self-pay | Admitting: Orthopedic Surgery

## 2016-02-24 LAB — CBC
HCT: 30 % — ABNORMAL LOW (ref 36.0–46.0)
Hemoglobin: 10.5 g/dL — ABNORMAL LOW (ref 12.0–15.0)
MCH: 33.5 pg (ref 26.0–34.0)
MCHC: 35 g/dL (ref 30.0–36.0)
MCV: 95.8 fL (ref 78.0–100.0)
PLATELETS: 137 10*3/uL — AB (ref 150–400)
RBC: 3.13 MIL/uL — ABNORMAL LOW (ref 3.87–5.11)
RDW: 14.8 % (ref 11.5–15.5)
WBC: 6.8 10*3/uL (ref 4.0–10.5)

## 2016-02-24 LAB — BASIC METABOLIC PANEL
ANION GAP: 5 (ref 5–15)
BUN: 15 mg/dL (ref 6–20)
CALCIUM: 8.8 mg/dL — AB (ref 8.9–10.3)
CO2: 23 mmol/L (ref 22–32)
Chloride: 107 mmol/L (ref 101–111)
Creatinine, Ser: 0.68 mg/dL (ref 0.44–1.00)
Glucose, Bld: 203 mg/dL — ABNORMAL HIGH (ref 65–99)
Potassium: 4.3 mmol/L (ref 3.5–5.1)
Sodium: 135 mmol/L (ref 135–145)

## 2016-02-24 MED ORDER — ONDANSETRON HCL 4 MG PO TABS: 4.0000 mg | ORAL_TABLET | Freq: Four times a day (QID) | ORAL | 0 refills | Status: DC | PRN

## 2016-02-24 MED ORDER — HYDROCODONE-ACETAMINOPHEN 5-325 MG PO TABS: 1.0000 | ORAL_TABLET | ORAL | 0 refills | Status: DC | PRN

## 2016-02-24 MED ORDER — METHOCARBAMOL 500 MG PO TABS: 500.0000 mg | ORAL_TABLET | Freq: Four times a day (QID) | ORAL | 0 refills | Status: DC | PRN

## 2016-02-24 MED ORDER — SENNA 8.6 MG PO TABS: 2.0000 | ORAL_TABLET | Freq: Every day | ORAL | 3 refills | Status: DC

## 2016-02-24 MED ORDER — ASPIRIN 81 MG PO CHEW: 81.0000 mg | CHEWABLE_TABLET | Freq: Two times a day (BID) | ORAL | 1 refills | Status: DC

## 2016-02-24 MED ORDER — DOCUSATE SODIUM 100 MG PO CAPS: 100.0000 mg | ORAL_CAPSULE | Freq: Two times a day (BID) | ORAL | 3 refills | Status: DC

## 2016-02-24 NOTE — Progress Notes (Signed)
   Subjective:  Patient reports pain as mild to moderate.  Walked in the hall with PT. No c/o.  Objective:   VITALS:   Vitals:   02/23/16 2214 02/23/16 2314 02/24/16 0154 02/24/16 0605  BP: (!) 114/58 (!) 108/58 116/60 (!) 115/59  Pulse: 68 70 72 84  Resp: 16 16 16 16   Temp: 98.4 F (36.9 C) 97.9 F (36.6 C) 97.5 F (36.4 C) 98.1 F (36.7 C)  TempSrc: Oral Oral Oral Oral  SpO2: 98% 97% 97% 98%  Weight:      Height:        ABD soft Sensation intact distally Intact pulses distally Dorsiflexion/Plantar flexion intact Incision: dressing C/D/I Compartment soft   Lab Results  Component Value Date   WBC 6.8 02/24/2016   HGB 10.5 (L) 02/24/2016   HCT 30.0 (L) 02/24/2016   MCV 95.8 02/24/2016   PLT 137 (L) 02/24/2016   BMET    Component Value Date/Time   NA 135 02/24/2016 0457   NA 140 12/07/2015 0918   K 4.3 02/24/2016 0457   CL 107 02/24/2016 0457   CO2 23 02/24/2016 0457   GLUCOSE 203 (H) 02/24/2016 0457   BUN 15 02/24/2016 0457   BUN 16 12/07/2015 0918   CREATININE 0.68 02/24/2016 0457   CREATININE 0.95 06/01/2013 1717   CALCIUM 8.8 (L) 02/24/2016 0457   GFRNONAA >60 02/24/2016 0457   GFRAA >60 02/24/2016 0457     Assessment/Plan: 1 Day Post-Op   Principal Problem:   Primary osteoarthritis of right hip   Advance diet Up with therapy Discharge home with home health   Franchot Pollitt, Horald Pollen 02/24/2016, 10:18 AM   Rod Can, MD Cell [phone]

## 2016-02-24 NOTE — Progress Notes (Signed)
Pt about to work with OT

## 2016-02-24 NOTE — Progress Notes (Signed)
   02/24/16 1300  PT Visit Information  Last PT Received On 02/24/16 pt making excellent progress, report she feels much better since surgery  Assistance Needed +1  History of Present Illness s/p R DA THA; Hx: bil TKA  Subjective Data  Patient Stated Goal home soon, less pain with walking  Precautions  Precautions Fall  Restrictions  Other Position/Activity Restrictions WBAT  Pain Assessment  Pain Assessment 0-10  Pain Score 1  Pain Location R hip  Pain Descriptors / Indicators Sore  Pain Intervention(s) Limited activity within patient's tolerance;Monitored during session  Cognition  Arousal/Alertness Awake/alert  Behavior During Therapy WFL for tasks assessed/performed  Overall Cognitive Status Within Functional Limits for tasks assessed  Transfers  Overall transfer level Needs assistance  Equipment used Rolling walker (2 wheeled)  Transfers Sit to/from Stand  Sit to Stand Supervision  General transfer comment cues for safety and hand placement  Ambulation/Gait  Ambulation/Gait assistance Supervision  Ambulation Distance (Feet) 150 Feet (20')  Assistive device Rolling walker (2 wheeled)  Gait Pattern/deviations Step-to pattern;Step-through pattern  General Gait Details cues for sequence, posture, RW position from self  Stairs Yes  Stairs assistance Min guard  Stair Management One rail Right;One rail Left;Step to pattern;Forwards;With crutches  Number of Stairs 4  General stair comments cues for technique  Total Joint Exercises  Quad Sets (reviewed HEP)  PT - End of Session  Equipment Utilized During Treatment Gait belt  Activity Tolerance Patient tolerated treatment well  Patient left in chair;with call bell/phone within reach;with family/visitor present  PT - Assessment/Plan  PT Plan Current plan remains appropriate  PT Frequency (ACUTE ONLY) 7X/week  Follow Up Recommendations Home health PT  PT equipment None recommended by PT  PT Goal Progression  Progress  towards PT goals Progressing toward goals  Acute Rehab PT Goals  PT Goal Formulation With patient  Time For Goal Achievement 02/28/16  Potential to Achieve Goals Good  PT Time Calculation  PT Start Time (ACUTE ONLY) 1329  PT Stop Time (ACUTE ONLY) 1350  PT Time Calculation (min) (ACUTE ONLY) 21 min  PT General Charges  $$ ACUTE PT VISIT 1 Procedure  PT Treatments  $Gait Training 8-22 mins

## 2016-02-24 NOTE — Progress Notes (Signed)
Reviewed pt D/C instruction with her and husband. Pt IV removed without complications. Pt wheeled to front entrance with personal belongings.

## 2016-02-24 NOTE — Care Management Note (Signed)
Case Management Note  Patient Details  Name: TIJANA TRUDGEON MRN: [id number] Date of Birth: [birth year]  Subjective/Objective: 70 y/o f admitted w/OA r hip. POD#1. Patient states she has rw,3n1. PT-recc HHPT. OT to eval.[employer] rep Tim aware of HHPT order, & d/c today.                   Action/Plan:d/c home w/HHC.   Expected Discharge Date:                  Expected Discharge Plan:  [place]  In-House Referral:     Discharge planning Services  CM Consult  Post Acute Care Choice:  Durable Medical Equipment (rw,3n1) Choice offered to:  Patient  DME Arranged:    DME Agency:  [facility] (now [facility])  [place] Arranged:  PT [place]:     Status of Service:     If discussed at [employer], dates discussed:    Additional Comments:  Dessa Phi, RN 02/24/2016, 10:50 AM

## 2016-02-24 NOTE — Evaluation (Signed)
Physical Therapy Evaluation Patient Details Name: Sara [name] MRN: [id number] DOB: [birth year] Today's Date: 02/24/2016   History of Present Illness  s/p R DA THA; Hx: bil TKA  Clinical Impression  Pt is s/p THA resulting in the deficits listed below (see PT Problem List). * Pt will benefit from skilled PT to increase their independence and safety with mobility to allow discharge to the venue listed below.  Will see second time for stair training, then should be ready for D/C     Follow Up Recommendations Home health PT    Equipment Recommendations  None recommended by PT    Recommendations for Other Services       Precautions / Restrictions Precautions Precautions: Fall Restrictions Weight Bearing Restrictions: No Other Position/Activity Restrictions: WBAT      Mobility  Bed Mobility Overal bed mobility: Needs Assistance Bed Mobility: Supine to Sit     Supine to sit: Supervision;HOB elevated     General bed mobility comments: incr time, cues to self assist  Transfers Overall transfer level: Needs assistance Equipment used: Rolling walker (2 wheeled) Transfers: Sit to/from Stand Sit to Stand: Min guard         General transfer comment: cues for safety and hand placement  Ambulation/Gait Ambulation/Gait assistance: Min guard;Supervision Ambulation Distance (Feet): 100 Feet (10' more) Assistive device: Rolling walker (2 wheeled) Gait Pattern/deviations: Step-through pattern;Decreased weight shift to right     General Gait Details: cues for sequence, posture, RW position from self  Stairs            Wheelchair Mobility    Modified Rankin (Stroke Patients Only)       Balance Overall balance assessment: Needs assistance   Sitting balance-Leahy Scale: Good       Standing balance-Leahy Scale: Fair                               Pertinent Vitals/Pain Pain Assessment: 0-10 Pain Score: 3  Pain Location: right hip Pain  Descriptors / Indicators: Aching;Tightness Pain Intervention(s): Limited activity within patient's tolerance;Monitored during session;Premedicated before session;Ice applied;Repositioned    Home Living Family/patient expects to be discharged to:: Private residence Living Arrangements: Spouse/significant other Available Help at Discharge: Family;Available PRN/intermittently Type of Home: House Home Access: Stairs to enter   Entrance Stairs-Number of Steps: 1 small Home Layout: Multi-level Home Equipment: Walker - 2 wheels;Crutches;Bedside commode;Shower seat      Prior Function Level of Independence: Independent               Hand Dominance        Extremity/Trunk Assessment   Upper Extremity Assessment: Defer to OT evaluation           Lower Extremity Assessment: RLE deficits/detail RLE Deficits / Details: ankle WFL, knee and hip grosslt 2+/5--anticipated post op weakness       Communication      Cognition Arousal/Alertness: Awake/alert Behavior During Therapy: WFL for tasks assessed/performed Overall Cognitive Status: Within Functional Limits for tasks assessed                      General Comments      Exercises Total Joint Exercises Ankle Circles/Pumps: AROM;Both;10 reps Quad Sets: 10 reps;Both;AROM      Assessment/Plan    PT Assessment    PT Diagnosis Difficulty walking   PT Problem List    PT Treatment Interventions     PT Goals (Current  goals can be found in the Care Plan section) Acute Rehab PT Goals Patient Stated Goal: home soon, less pain with walking PT Goal Formulation: With patient Time For Goal Achievement: 02/28/16 Potential to Achieve Goals: Good    Frequency     Barriers to discharge        Co-evaluation               End of Session Equipment Utilized During Treatment: Gait belt Activity Tolerance: Patient tolerated treatment well Patient left: in chair;with call bell/phone within reach;with  family/visitor present      Functional Assessment Tool Used: 1030    Time: 1006-1030 PT Time Calculation (min) (ACUTE ONLY): 24 min   Charges:   PT Evaluation $PT Eval Low Complexity: 1 Procedure PT Treatments $Gait Training: 8-22 mins   PT G Codes:   PT G-Codes **NOT FOR INPATIENT CLASS** Functional Assessment Tool Used: 1030    [facility] 02/24/2016, 10:40 AM

## 2016-02-24 NOTE — Evaluation (Signed)
Occupational Therapy Evaluation Patient Details Name: Sara [name] MRN: [id number] DOB: 04-15-1946 Today's Date: 02/24/2016    History of Present Illness s/p R DA THA; Hx: bil TKA   Clinical Impression   This 70 year old female was admitted for the above sx. All education was completed.  No further OT Is needed at this time    Follow Up Recommendations  Supervision/Assistance - 24 hour    Equipment Recommendations  None recommended by OT    Recommendations for Other Services       Precautions / Restrictions Precautions Precautions: Fall Restrictions Weight Bearing Restrictions: No Other Position/Activity Restrictions: WBAT      Mobility Bed Mobility              Transfers Overall transfer level: Needs assistance Equipment used: Rolling walker (2 wheeled) Transfers: Sit to/from Stand Sit to Stand: Min guard         General transfer comment: for safety    Balance Overall balance assessment: Needs assistance   Sitting balance-Leahy Scale: Good       Standing balance-Leahy Scale: Fair                              ADL Overall ADL's : Needs assistance/impaired     Grooming: Supervision/safety;Standing       Lower Body Bathing: Minimal assistance;Sit to/from stand       Lower Body Dressing: Moderate assistance;Sit to/from stand   Toilet Transfer: Min guard;Ambulation;BSC   Toileting- Water quality scientist and Hygiene: Min guard;Sit to/from stand         General ADL Comments: Pt has tub bench and BSC from previous sxs.  Multiple questions about precautions and educated to work within pain tolerance.  Pt states that she usually wears flip flops in the house.  Recommended wearing her non-skid socks or shoe with a back. Also reminded her that she will wear ted hose for a couple of weeks (but recommended no flip flops beyond this)     Vision     Perception     Praxis      Pertinent Vitals/Pain Pain Assessment: 0-10 Pain  Score: 2  Pain Location: R hip  Pain Descriptors / Indicators: Sore Pain Intervention(s): Limited activity within patient's tolerance;Monitored during session;Premedicated before session;Repositioned;Ice applied     Hand Dominance     Extremity/Trunk Assessment Upper Extremity Assessment Upper Extremity Assessment: Overall WFL for tasks assessed          Communication Communication Communication: No difficulties   Cognition Arousal/Alertness: Awake/alert Behavior During Therapy: WFL for tasks assessed/performed Overall Cognitive Status: Within Functional Limits for tasks assessed                     General Comments       Exercises       Shoulder Instructions      Home Living Family/patient expects to be discharged to:: Private residence Living Arrangements: Spouse/significant other Available Help at Discharge: Family;Available PRN/intermittently Type of Home: House Home Access: Stairs to enter Entrance Stairs-Number of Steps: 1 small   Home Layout: Multi-level Alternate Level Stairs-Number of Steps: 8 Alternate Level Stairs-Rails: Right Bathroom Shower/Tub: Tub/shower unit Shower/tub characteristics: Architectural technologist: Standard     Home Equipment: Tub bench;Bedside commode;Walker - 2 wheels          Prior Functioning/Environment Level of Independence: Independent  OT Diagnosis: Acute pain   OT Problem List:     OT Treatment/Interventions:      OT Goals(Current goals can be found in the care plan section) Acute Rehab OT Goals Patient Stated Goal: home soon, less pain with walking OT Goal Formulation: All assessment and education complete, DC therapy  OT Frequency:     Barriers to D/C:            Co-evaluation              End of Session    Activity Tolerance: Patient tolerated treatment well Patient left: in chair;with call bell/phone within reach;with family/visitor present   Time: [id number] OT Time  Calculation (min): 16 min Charges:  OT General Charges $OT Visit: 1 Procedure OT Evaluation $OT Eval Low Complexity: 1 Procedure G-Codes:    Courney Garrod 03-18-2016, 11:39 AM  Lesle Chris, OTR/L [phone] Mar 18, 2016

## 2016-02-25 DIAGNOSIS — L409 Psoriasis, unspecified: Secondary | ICD-10-CM | POA: Diagnosis not present

## 2016-02-25 DIAGNOSIS — G4733 Obstructive sleep apnea (adult) (pediatric): Secondary | ICD-10-CM | POA: Diagnosis not present

## 2016-02-25 DIAGNOSIS — Z471 Aftercare following joint replacement surgery: Secondary | ICD-10-CM | POA: Diagnosis not present

## 2016-02-25 DIAGNOSIS — R42 Dizziness and giddiness: Secondary | ICD-10-CM | POA: Diagnosis not present

## 2016-02-25 DIAGNOSIS — K5790 Diverticulosis of intestine, part unspecified, without perforation or abscess without bleeding: Secondary | ICD-10-CM | POA: Diagnosis not present

## 2016-02-25 DIAGNOSIS — I10 Essential (primary) hypertension: Secondary | ICD-10-CM | POA: Diagnosis not present

## 2016-02-25 DIAGNOSIS — M1991 Primary osteoarthritis, unspecified site: Secondary | ICD-10-CM | POA: Diagnosis not present

## 2016-02-25 DIAGNOSIS — E785 Hyperlipidemia, unspecified: Secondary | ICD-10-CM | POA: Diagnosis not present

## 2016-02-25 DIAGNOSIS — I252 Old myocardial infarction: Secondary | ICD-10-CM | POA: Diagnosis not present

## 2016-02-25 DIAGNOSIS — I2510 Atherosclerotic heart disease of native coronary artery without angina pectoris: Secondary | ICD-10-CM | POA: Diagnosis not present

## 2016-02-25 DIAGNOSIS — M549 Dorsalgia, unspecified: Secondary | ICD-10-CM | POA: Diagnosis not present

## 2016-02-25 DIAGNOSIS — K649 Unspecified hemorrhoids: Secondary | ICD-10-CM | POA: Diagnosis not present

## 2016-02-27 DIAGNOSIS — G4733 Obstructive sleep apnea (adult) (pediatric): Secondary | ICD-10-CM | POA: Diagnosis not present

## 2016-02-27 DIAGNOSIS — Z471 Aftercare following joint replacement surgery: Secondary | ICD-10-CM | POA: Diagnosis not present

## 2016-02-27 DIAGNOSIS — K649 Unspecified hemorrhoids: Secondary | ICD-10-CM | POA: Diagnosis not present

## 2016-02-27 DIAGNOSIS — I10 Essential (primary) hypertension: Secondary | ICD-10-CM | POA: Diagnosis not present

## 2016-02-27 DIAGNOSIS — R42 Dizziness and giddiness: Secondary | ICD-10-CM | POA: Diagnosis not present

## 2016-02-27 DIAGNOSIS — E785 Hyperlipidemia, unspecified: Secondary | ICD-10-CM | POA: Diagnosis not present

## 2016-02-27 DIAGNOSIS — K5790 Diverticulosis of intestine, part unspecified, without perforation or abscess without bleeding: Secondary | ICD-10-CM | POA: Diagnosis not present

## 2016-02-27 DIAGNOSIS — M1991 Primary osteoarthritis, unspecified site: Secondary | ICD-10-CM | POA: Diagnosis not present

## 2016-02-27 DIAGNOSIS — I252 Old myocardial infarction: Secondary | ICD-10-CM | POA: Diagnosis not present

## 2016-02-27 DIAGNOSIS — I2510 Atherosclerotic heart disease of native coronary artery without angina pectoris: Secondary | ICD-10-CM | POA: Diagnosis not present

## 2016-02-27 DIAGNOSIS — M549 Dorsalgia, unspecified: Secondary | ICD-10-CM | POA: Diagnosis not present

## 2016-02-27 DIAGNOSIS — L409 Psoriasis, unspecified: Secondary | ICD-10-CM | POA: Diagnosis not present

## 2016-02-28 DIAGNOSIS — K649 Unspecified hemorrhoids: Secondary | ICD-10-CM | POA: Diagnosis not present

## 2016-02-28 DIAGNOSIS — R42 Dizziness and giddiness: Secondary | ICD-10-CM | POA: Diagnosis not present

## 2016-02-28 DIAGNOSIS — M549 Dorsalgia, unspecified: Secondary | ICD-10-CM | POA: Diagnosis not present

## 2016-02-28 DIAGNOSIS — I252 Old myocardial infarction: Secondary | ICD-10-CM | POA: Diagnosis not present

## 2016-02-28 DIAGNOSIS — I10 Essential (primary) hypertension: Secondary | ICD-10-CM | POA: Diagnosis not present

## 2016-02-28 DIAGNOSIS — L409 Psoriasis, unspecified: Secondary | ICD-10-CM | POA: Diagnosis not present

## 2016-02-28 DIAGNOSIS — E785 Hyperlipidemia, unspecified: Secondary | ICD-10-CM | POA: Diagnosis not present

## 2016-02-28 DIAGNOSIS — I2510 Atherosclerotic heart disease of native coronary artery without angina pectoris: Secondary | ICD-10-CM | POA: Diagnosis not present

## 2016-02-28 DIAGNOSIS — G4733 Obstructive sleep apnea (adult) (pediatric): Secondary | ICD-10-CM | POA: Diagnosis not present

## 2016-02-28 DIAGNOSIS — M1991 Primary osteoarthritis, unspecified site: Secondary | ICD-10-CM | POA: Diagnosis not present

## 2016-02-28 DIAGNOSIS — K5790 Diverticulosis of intestine, part unspecified, without perforation or abscess without bleeding: Secondary | ICD-10-CM | POA: Diagnosis not present

## 2016-02-28 DIAGNOSIS — Z471 Aftercare following joint replacement surgery: Secondary | ICD-10-CM | POA: Diagnosis not present

## 2016-03-01 DIAGNOSIS — I2510 Atherosclerotic heart disease of native coronary artery without angina pectoris: Secondary | ICD-10-CM | POA: Diagnosis not present

## 2016-03-01 DIAGNOSIS — M1991 Primary osteoarthritis, unspecified site: Secondary | ICD-10-CM | POA: Diagnosis not present

## 2016-03-01 DIAGNOSIS — G4733 Obstructive sleep apnea (adult) (pediatric): Secondary | ICD-10-CM | POA: Diagnosis not present

## 2016-03-01 DIAGNOSIS — E785 Hyperlipidemia, unspecified: Secondary | ICD-10-CM | POA: Diagnosis not present

## 2016-03-01 DIAGNOSIS — K649 Unspecified hemorrhoids: Secondary | ICD-10-CM | POA: Diagnosis not present

## 2016-03-01 DIAGNOSIS — I10 Essential (primary) hypertension: Secondary | ICD-10-CM | POA: Diagnosis not present

## 2016-03-01 DIAGNOSIS — Z471 Aftercare following joint replacement surgery: Secondary | ICD-10-CM | POA: Diagnosis not present

## 2016-03-01 DIAGNOSIS — L409 Psoriasis, unspecified: Secondary | ICD-10-CM | POA: Diagnosis not present

## 2016-03-01 DIAGNOSIS — M549 Dorsalgia, unspecified: Secondary | ICD-10-CM | POA: Diagnosis not present

## 2016-03-01 DIAGNOSIS — I252 Old myocardial infarction: Secondary | ICD-10-CM | POA: Diagnosis not present

## 2016-03-01 DIAGNOSIS — R42 Dizziness and giddiness: Secondary | ICD-10-CM | POA: Diagnosis not present

## 2016-03-01 DIAGNOSIS — K5790 Diverticulosis of intestine, part unspecified, without perforation or abscess without bleeding: Secondary | ICD-10-CM | POA: Diagnosis not present

## 2016-03-05 DIAGNOSIS — K649 Unspecified hemorrhoids: Secondary | ICD-10-CM | POA: Diagnosis not present

## 2016-03-05 DIAGNOSIS — K5790 Diverticulosis of intestine, part unspecified, without perforation or abscess without bleeding: Secondary | ICD-10-CM | POA: Diagnosis not present

## 2016-03-05 DIAGNOSIS — Z471 Aftercare following joint replacement surgery: Secondary | ICD-10-CM | POA: Diagnosis not present

## 2016-03-05 DIAGNOSIS — M549 Dorsalgia, unspecified: Secondary | ICD-10-CM | POA: Diagnosis not present

## 2016-03-05 DIAGNOSIS — I252 Old myocardial infarction: Secondary | ICD-10-CM | POA: Diagnosis not present

## 2016-03-05 DIAGNOSIS — E785 Hyperlipidemia, unspecified: Secondary | ICD-10-CM | POA: Diagnosis not present

## 2016-03-05 DIAGNOSIS — R42 Dizziness and giddiness: Secondary | ICD-10-CM | POA: Diagnosis not present

## 2016-03-05 DIAGNOSIS — M1991 Primary osteoarthritis, unspecified site: Secondary | ICD-10-CM | POA: Diagnosis not present

## 2016-03-05 DIAGNOSIS — I10 Essential (primary) hypertension: Secondary | ICD-10-CM | POA: Diagnosis not present

## 2016-03-05 DIAGNOSIS — G4733 Obstructive sleep apnea (adult) (pediatric): Secondary | ICD-10-CM | POA: Diagnosis not present

## 2016-03-05 DIAGNOSIS — I2510 Atherosclerotic heart disease of native coronary artery without angina pectoris: Secondary | ICD-10-CM | POA: Diagnosis not present

## 2016-03-05 DIAGNOSIS — L409 Psoriasis, unspecified: Secondary | ICD-10-CM | POA: Diagnosis not present

## 2016-03-06 DIAGNOSIS — G4733 Obstructive sleep apnea (adult) (pediatric): Secondary | ICD-10-CM | POA: Diagnosis not present

## 2016-03-06 DIAGNOSIS — I252 Old myocardial infarction: Secondary | ICD-10-CM | POA: Diagnosis not present

## 2016-03-06 DIAGNOSIS — E785 Hyperlipidemia, unspecified: Secondary | ICD-10-CM | POA: Diagnosis not present

## 2016-03-06 DIAGNOSIS — R42 Dizziness and giddiness: Secondary | ICD-10-CM | POA: Diagnosis not present

## 2016-03-06 DIAGNOSIS — L409 Psoriasis, unspecified: Secondary | ICD-10-CM | POA: Diagnosis not present

## 2016-03-06 DIAGNOSIS — Z471 Aftercare following joint replacement surgery: Secondary | ICD-10-CM | POA: Diagnosis not present

## 2016-03-06 DIAGNOSIS — M549 Dorsalgia, unspecified: Secondary | ICD-10-CM | POA: Diagnosis not present

## 2016-03-06 DIAGNOSIS — K5790 Diverticulosis of intestine, part unspecified, without perforation or abscess without bleeding: Secondary | ICD-10-CM | POA: Diagnosis not present

## 2016-03-06 DIAGNOSIS — I10 Essential (primary) hypertension: Secondary | ICD-10-CM | POA: Diagnosis not present

## 2016-03-06 DIAGNOSIS — K649 Unspecified hemorrhoids: Secondary | ICD-10-CM | POA: Diagnosis not present

## 2016-03-06 DIAGNOSIS — M1991 Primary osteoarthritis, unspecified site: Secondary | ICD-10-CM | POA: Diagnosis not present

## 2016-03-06 DIAGNOSIS — I2510 Atherosclerotic heart disease of native coronary artery without angina pectoris: Secondary | ICD-10-CM | POA: Diagnosis not present

## 2016-03-07 DIAGNOSIS — Z96641 Presence of right artificial hip joint: Secondary | ICD-10-CM | POA: Diagnosis not present

## 2016-03-07 DIAGNOSIS — Z471 Aftercare following joint replacement surgery: Secondary | ICD-10-CM | POA: Diagnosis not present

## 2016-03-08 DIAGNOSIS — E785 Hyperlipidemia, unspecified: Secondary | ICD-10-CM | POA: Diagnosis not present

## 2016-03-08 DIAGNOSIS — M549 Dorsalgia, unspecified: Secondary | ICD-10-CM | POA: Diagnosis not present

## 2016-03-08 DIAGNOSIS — Z471 Aftercare following joint replacement surgery: Secondary | ICD-10-CM | POA: Diagnosis not present

## 2016-03-08 DIAGNOSIS — K649 Unspecified hemorrhoids: Secondary | ICD-10-CM | POA: Diagnosis not present

## 2016-03-08 DIAGNOSIS — R42 Dizziness and giddiness: Secondary | ICD-10-CM | POA: Diagnosis not present

## 2016-03-08 DIAGNOSIS — I10 Essential (primary) hypertension: Secondary | ICD-10-CM | POA: Diagnosis not present

## 2016-03-08 DIAGNOSIS — L409 Psoriasis, unspecified: Secondary | ICD-10-CM | POA: Diagnosis not present

## 2016-03-08 DIAGNOSIS — M1991 Primary osteoarthritis, unspecified site: Secondary | ICD-10-CM | POA: Diagnosis not present

## 2016-03-08 DIAGNOSIS — G4733 Obstructive sleep apnea (adult) (pediatric): Secondary | ICD-10-CM | POA: Diagnosis not present

## 2016-03-08 DIAGNOSIS — I2510 Atherosclerotic heart disease of native coronary artery without angina pectoris: Secondary | ICD-10-CM | POA: Diagnosis not present

## 2016-03-08 DIAGNOSIS — K5790 Diverticulosis of intestine, part unspecified, without perforation or abscess without bleeding: Secondary | ICD-10-CM | POA: Diagnosis not present

## 2016-03-08 DIAGNOSIS — I252 Old myocardial infarction: Secondary | ICD-10-CM | POA: Diagnosis not present

## 2016-03-09 ENCOUNTER — Other Ambulatory Visit: Payer: Self-pay | Admitting: Family

## 2016-03-09 DIAGNOSIS — I10 Essential (primary) hypertension: Secondary | ICD-10-CM

## 2016-03-09 DIAGNOSIS — R609 Edema, unspecified: Secondary | ICD-10-CM

## 2016-03-12 ENCOUNTER — Other Ambulatory Visit: Payer: Self-pay | Admitting: Obstetrics and Gynecology

## 2016-03-12 DIAGNOSIS — Z1231 Encounter for screening mammogram for malignant neoplasm of breast: Secondary | ICD-10-CM

## 2016-03-13 DIAGNOSIS — M549 Dorsalgia, unspecified: Secondary | ICD-10-CM | POA: Diagnosis not present

## 2016-03-13 DIAGNOSIS — R42 Dizziness and giddiness: Secondary | ICD-10-CM | POA: Diagnosis not present

## 2016-03-13 DIAGNOSIS — I252 Old myocardial infarction: Secondary | ICD-10-CM | POA: Diagnosis not present

## 2016-03-13 DIAGNOSIS — M1991 Primary osteoarthritis, unspecified site: Secondary | ICD-10-CM | POA: Diagnosis not present

## 2016-03-13 DIAGNOSIS — K5790 Diverticulosis of intestine, part unspecified, without perforation or abscess without bleeding: Secondary | ICD-10-CM | POA: Diagnosis not present

## 2016-03-13 DIAGNOSIS — I10 Essential (primary) hypertension: Secondary | ICD-10-CM | POA: Diagnosis not present

## 2016-03-13 DIAGNOSIS — K649 Unspecified hemorrhoids: Secondary | ICD-10-CM | POA: Diagnosis not present

## 2016-03-13 DIAGNOSIS — Z471 Aftercare following joint replacement surgery: Secondary | ICD-10-CM | POA: Diagnosis not present

## 2016-03-13 DIAGNOSIS — L409 Psoriasis, unspecified: Secondary | ICD-10-CM | POA: Diagnosis not present

## 2016-03-13 DIAGNOSIS — E785 Hyperlipidemia, unspecified: Secondary | ICD-10-CM | POA: Diagnosis not present

## 2016-03-13 DIAGNOSIS — G4733 Obstructive sleep apnea (adult) (pediatric): Secondary | ICD-10-CM | POA: Diagnosis not present

## 2016-03-13 DIAGNOSIS — I2510 Atherosclerotic heart disease of native coronary artery without angina pectoris: Secondary | ICD-10-CM | POA: Diagnosis not present

## 2016-03-16 ENCOUNTER — Ambulatory Visit (INDEPENDENT_AMBULATORY_CARE_PROVIDER_SITE_OTHER): Payer: Medicare Other | Admitting: Family

## 2016-03-16 ENCOUNTER — Telehealth: Payer: Self-pay | Admitting: Family Medicine

## 2016-03-16 ENCOUNTER — Encounter: Payer: Self-pay | Admitting: Family

## 2016-03-16 VITALS — BP 112/77 | HR 86 | Temp 97.2°F | Ht 64.0 in | Wt 250.0 lb

## 2016-03-16 DIAGNOSIS — L409 Psoriasis, unspecified: Secondary | ICD-10-CM | POA: Diagnosis not present

## 2016-03-16 DIAGNOSIS — K649 Unspecified hemorrhoids: Secondary | ICD-10-CM | POA: Diagnosis not present

## 2016-03-16 DIAGNOSIS — I10 Essential (primary) hypertension: Secondary | ICD-10-CM | POA: Diagnosis not present

## 2016-03-16 DIAGNOSIS — J0110 Acute frontal sinusitis, unspecified: Secondary | ICD-10-CM | POA: Diagnosis not present

## 2016-03-16 DIAGNOSIS — R42 Dizziness and giddiness: Secondary | ICD-10-CM | POA: Diagnosis not present

## 2016-03-16 DIAGNOSIS — M1991 Primary osteoarthritis, unspecified site: Secondary | ICD-10-CM | POA: Diagnosis not present

## 2016-03-16 DIAGNOSIS — E785 Hyperlipidemia, unspecified: Secondary | ICD-10-CM | POA: Diagnosis not present

## 2016-03-16 DIAGNOSIS — G4733 Obstructive sleep apnea (adult) (pediatric): Secondary | ICD-10-CM | POA: Diagnosis not present

## 2016-03-16 DIAGNOSIS — I252 Old myocardial infarction: Secondary | ICD-10-CM | POA: Diagnosis not present

## 2016-03-16 DIAGNOSIS — Z471 Aftercare following joint replacement surgery: Secondary | ICD-10-CM | POA: Diagnosis not present

## 2016-03-16 DIAGNOSIS — M549 Dorsalgia, unspecified: Secondary | ICD-10-CM | POA: Diagnosis not present

## 2016-03-16 DIAGNOSIS — K5790 Diverticulosis of intestine, part unspecified, without perforation or abscess without bleeding: Secondary | ICD-10-CM | POA: Diagnosis not present

## 2016-03-16 DIAGNOSIS — I2510 Atherosclerotic heart disease of native coronary artery without angina pectoris: Secondary | ICD-10-CM | POA: Diagnosis not present

## 2016-03-16 MED ORDER — AMOXICILLIN-POT CLAVULANATE 875-125 MG PO TABS: 1.0000 | ORAL_TABLET | Freq: Two times a day (BID) | ORAL | 0 refills | Status: DC

## 2016-03-16 NOTE — Telephone Encounter (Signed)
error 

## 2016-03-16 NOTE — Patient Instructions (Signed)
Sinusitis, Adult Sinusitis is redness, soreness, and inflammation of the paranasal sinuses. Paranasal sinuses are air pockets within the bones of your face. They are located beneath your eyes, in the middle of your forehead, and above your eyes. In healthy paranasal sinuses, mucus is able to drain out, and air is able to circulate through them by way of your nose. However, when your paranasal sinuses are inflamed, mucus and air can become trapped. This can allow bacteria and other germs to grow and cause infection. Sinusitis can develop quickly and last only a short time (acute) or continue over a long period (chronic). Sinusitis that lasts for more than 12 weeks is considered chronic. CAUSES Causes of sinusitis include:  Allergies.  Structural abnormalities, such as displacement of the cartilage that separates your nostrils (deviated septum), which can decrease the air flow through your nose and sinuses and affect sinus drainage.  Functional abnormalities, such as when the small hairs (cilia) that line your sinuses and help remove mucus do not work properly or are not present. SIGNS AND SYMPTOMS Symptoms of acute and chronic sinusitis are the same. The primary symptoms are pain and pressure around the affected sinuses. Other symptoms include:  Upper toothache.  Earache.  Headache.  Bad breath.  Decreased sense of smell and taste.  A cough, which worsens when you are lying flat.  Fatigue.  Fever.  Thick drainage from your nose, which often is green and may contain pus (purulent).  Swelling and warmth over the affected sinuses. DIAGNOSIS Your health care provider will perform a physical exam. During your exam, your health care provider may perform any of the following to help determine if you have acute sinusitis or chronic sinusitis:  Look in your nose for signs of abnormal growths in your nostrils (nasal polyps).  Tap over the affected sinus to check for signs of  infection.  View the inside of your sinuses using an imaging device that has a light attached (endoscope). If your health care provider suspects that you have chronic sinusitis, one or more of the following tests may be recommended:  Allergy tests.  Nasal culture. A sample of mucus is taken from your nose, sent to a lab, and screened for bacteria.  Nasal cytology. A sample of mucus is taken from your nose and examined by your health care provider to determine if your sinusitis is related to an allergy. TREATMENT Most cases of acute sinusitis are related to a viral infection and will resolve on their own within 10 days. Sometimes, medicines are prescribed to help relieve symptoms of both acute and chronic sinusitis. These may include pain medicines, decongestants, nasal steroid sprays, or saline sprays. However, for sinusitis related to a bacterial infection, your health care provider will prescribe antibiotic medicines. These are medicines that will help kill the bacteria causing the infection. Rarely, sinusitis is caused by a fungal infection. In these cases, your health care provider will prescribe antifungal medicine. For some cases of chronic sinusitis, surgery is needed. Generally, these are cases in which sinusitis recurs more than 3 times per year, despite other treatments. HOME CARE INSTRUCTIONS  Drink plenty of water. Water helps thin the mucus so your sinuses can drain more easily.  Use a humidifier.  Inhale steam 3-4 times a day (for example, sit in the bathroom with the shower running).  Apply a warm, moist washcloth to your face 3-4 times a day, or as directed by your health care provider.  Use saline nasal sprays to help   moisten and clean your sinuses.  Take medicines only as directed by your health care provider.  If you were prescribed either an antibiotic or antifungal medicine, finish it all even if you start to feel better. SEEK IMMEDIATE MEDICAL CARE IF:  You have  increasing pain or severe headaches.  You have nausea, vomiting, or drowsiness.  You have swelling around your face.  You have vision problems.  You have a stiff neck.  You have difficulty breathing.   This information is not intended to replace advice given to you by your health care provider. Make sure you discuss any questions you have with your health care provider.   Document Released: 07/09/2005 Document Revised: 07/30/2014 Document Reviewed: 07/24/2011 Elsevier Interactive Patient Education 2016 [employer]  - Take meds as prescribed - Use a cool mist humidifier  -Use saline nose sprays frequently -Saline irrigations of the nose can be very helpful if done frequently.  * 4X daily for 1 week*  * Use of a nettie pot can be helpful with this. Follow directions with this* -Force fluids -For any cough or congestion  Use plain Mucinex- regular strength or max strength is fine   * Children- consult with Pharmacist for dosing -For fever or aces or pains- take tylenol or ibuprofen appropriate for age and weight.  * for fevers greater than 101 orally you may alternate ibuprofen and tylenol every  3 hours. -Throat lozenges if help   Kamayah Pillay, FNP   

## 2016-03-16 NOTE — Progress Notes (Signed)
   Subjective:    Patient ID: Sara [name], female    DOB: Nov 30, 1945, 70 y.o.   MRN: [id number]  Sinus Problem  This is a new problem. The current episode started 1 to 4 weeks ago. The problem has been waxing and waning since onset. There has been no fever. Her pain is at a severity of 6/10. The pain is moderate. Associated symptoms include congestion, coughing, ear pain (right), a hoarse voice, shortness of breath, sinus pressure, sneezing and a sore throat. Pertinent negatives include no chills or headaches. Past treatments include oral decongestants, sitting up and saline sprays. The treatment provided mild relief.  Wheezing   Associated symptoms include coughing, ear pain (right), shortness of breath and a sore throat. Pertinent negatives include no chills or headaches.      Review of Systems  Constitutional: Negative for chills.  HENT: Positive for congestion, ear pain (right), hoarse voice, sinus pressure, sneezing and sore throat.   Respiratory: Positive for cough, shortness of breath and wheezing.   Neurological: Negative for headaches.  All other systems reviewed and are negative.      Objective:   Physical Exam  Constitutional: She is oriented to person, place, and time. She appears well-developed and well-nourished. No distress.  HENT:  Head: Normocephalic and atraumatic.  Right Ear: External ear normal.  Left Ear: External ear normal.  Nose: Mucosal edema and rhinorrhea present. Right sinus exhibits frontal sinus tenderness. Left sinus exhibits frontal sinus tenderness.  Mouth/Throat: Posterior oropharyngeal erythema present.  Eyes: Pupils are equal, round, and reactive to light.  Neck: Normal range of motion. Neck supple. No thyromegaly present.  Cardiovascular: Normal rate, regular rhythm, normal heart sounds and intact distal pulses.   No murmur heard. Pulmonary/Chest: Effort normal and breath sounds normal. No respiratory distress. She has no wheezes.  Abdominal:  Soft. Bowel sounds are normal. She exhibits no distension. There is no tenderness.  Musculoskeletal: Normal range of motion. She exhibits no edema or tenderness.  Neurological: She is alert and oriented to person, place, and time. She has normal reflexes. No cranial nerve deficit.  Skin: Skin is warm and dry.  Psychiatric: She has a normal mood and affect. Her behavior is normal. Judgment and thought content normal.  Vitals reviewed.     BP 112/77   Pulse 86   Temp 97.2 F (36.2 C) (Oral)   Ht 5\' 4"  (1.626 m)   Wt 250 lb (113.4 kg)   BMI 42.91 kg/m      Assessment & Plan:  1. Acute frontal sinusitis, recurrence not specified -- Take meds as prescribed - Use a cool mist humidifier  -Use saline nose sprays frequently -Saline irrigations of the nose can be very helpful if done frequently.  * 4X daily for 1 week*  * Use of a nettie pot can be helpful with this. Follow directions with this* -Force fluids -For any cough or congestion  Use plain Mucinex- regular strength or max strength is fine   * Children- consult with Pharmacist for dosing -For fever or aces or pains- take tylenol or ibuprofen appropriate for age and weight.  * for fevers greater than 101 orally you may alternate ibuprofen and tylenol every  3 hours. -Throat lozenges if help -New toothbrush in 3 days - amoxicillin-clavulanate (AUGMENTIN) 875-125 MG tablet; Take 1 tablet by mouth 2 (two) times daily.  Dispense: 14 tablet; Refill: 0  Evelina Dun, FNP

## 2016-03-20 DIAGNOSIS — I2510 Atherosclerotic heart disease of native coronary artery without angina pectoris: Secondary | ICD-10-CM | POA: Diagnosis not present

## 2016-03-20 DIAGNOSIS — Z471 Aftercare following joint replacement surgery: Secondary | ICD-10-CM | POA: Diagnosis not present

## 2016-03-20 DIAGNOSIS — I10 Essential (primary) hypertension: Secondary | ICD-10-CM | POA: Diagnosis not present

## 2016-03-20 DIAGNOSIS — G4733 Obstructive sleep apnea (adult) (pediatric): Secondary | ICD-10-CM | POA: Diagnosis not present

## 2016-03-20 DIAGNOSIS — M549 Dorsalgia, unspecified: Secondary | ICD-10-CM | POA: Diagnosis not present

## 2016-03-20 DIAGNOSIS — I252 Old myocardial infarction: Secondary | ICD-10-CM | POA: Diagnosis not present

## 2016-03-20 DIAGNOSIS — K5790 Diverticulosis of intestine, part unspecified, without perforation or abscess without bleeding: Secondary | ICD-10-CM | POA: Diagnosis not present

## 2016-03-20 DIAGNOSIS — E785 Hyperlipidemia, unspecified: Secondary | ICD-10-CM | POA: Diagnosis not present

## 2016-03-20 DIAGNOSIS — M1991 Primary osteoarthritis, unspecified site: Secondary | ICD-10-CM | POA: Diagnosis not present

## 2016-03-20 DIAGNOSIS — L409 Psoriasis, unspecified: Secondary | ICD-10-CM | POA: Diagnosis not present

## 2016-03-20 DIAGNOSIS — K649 Unspecified hemorrhoids: Secondary | ICD-10-CM | POA: Diagnosis not present

## 2016-03-20 DIAGNOSIS — R42 Dizziness and giddiness: Secondary | ICD-10-CM | POA: Diagnosis not present

## 2016-03-22 ENCOUNTER — Other Ambulatory Visit: Payer: Self-pay | Admitting: Family Medicine

## 2016-03-22 ENCOUNTER — Other Ambulatory Visit: Payer: Self-pay | Admitting: Family

## 2016-03-22 DIAGNOSIS — K649 Unspecified hemorrhoids: Secondary | ICD-10-CM | POA: Diagnosis not present

## 2016-03-22 DIAGNOSIS — J0110 Acute frontal sinusitis, unspecified: Secondary | ICD-10-CM

## 2016-03-22 DIAGNOSIS — I10 Essential (primary) hypertension: Secondary | ICD-10-CM | POA: Diagnosis not present

## 2016-03-22 DIAGNOSIS — E785 Hyperlipidemia, unspecified: Secondary | ICD-10-CM | POA: Diagnosis not present

## 2016-03-22 DIAGNOSIS — R42 Dizziness and giddiness: Secondary | ICD-10-CM | POA: Diagnosis not present

## 2016-03-22 DIAGNOSIS — Z471 Aftercare following joint replacement surgery: Secondary | ICD-10-CM | POA: Diagnosis not present

## 2016-03-22 DIAGNOSIS — M1991 Primary osteoarthritis, unspecified site: Secondary | ICD-10-CM | POA: Diagnosis not present

## 2016-03-22 DIAGNOSIS — K5790 Diverticulosis of intestine, part unspecified, without perforation or abscess without bleeding: Secondary | ICD-10-CM | POA: Diagnosis not present

## 2016-03-22 DIAGNOSIS — I252 Old myocardial infarction: Secondary | ICD-10-CM | POA: Diagnosis not present

## 2016-03-22 DIAGNOSIS — I2510 Atherosclerotic heart disease of native coronary artery without angina pectoris: Secondary | ICD-10-CM | POA: Diagnosis not present

## 2016-03-22 DIAGNOSIS — G4733 Obstructive sleep apnea (adult) (pediatric): Secondary | ICD-10-CM | POA: Diagnosis not present

## 2016-03-22 DIAGNOSIS — L409 Psoriasis, unspecified: Secondary | ICD-10-CM | POA: Diagnosis not present

## 2016-03-22 DIAGNOSIS — M549 Dorsalgia, unspecified: Secondary | ICD-10-CM | POA: Diagnosis not present

## 2016-03-23 ENCOUNTER — Telehealth: Payer: Self-pay | Admitting: Family Medicine

## 2016-03-23 NOTE — Telephone Encounter (Signed)
Spoke with pt and advised that although she finished the antibiotic today it will remain in her system for the next week or so and to continue with the Mucinex, rest and increase fluids. Pt voiced understanding.

## 2016-03-23 NOTE — Telephone Encounter (Signed)
Patient needs an appointment if she is not improved, not a refill on the antibiotic.

## 2016-04-03 DIAGNOSIS — Z471 Aftercare following joint replacement surgery: Secondary | ICD-10-CM | POA: Diagnosis not present

## 2016-04-03 DIAGNOSIS — Z96641 Presence of right artificial hip joint: Secondary | ICD-10-CM | POA: Diagnosis not present

## 2016-04-09 ENCOUNTER — Ambulatory Visit (INDEPENDENT_AMBULATORY_CARE_PROVIDER_SITE_OTHER): Payer: Medicare Other | Admitting: Family

## 2016-04-09 VITALS — BP 121/78 | HR 87 | Temp 97.2°F

## 2016-04-09 DIAGNOSIS — I2510 Atherosclerotic heart disease of native coronary artery without angina pectoris: Secondary | ICD-10-CM

## 2016-04-09 DIAGNOSIS — R079 Chest pain, unspecified: Secondary | ICD-10-CM | POA: Diagnosis not present

## 2016-04-09 DIAGNOSIS — R0789 Other chest pain: Secondary | ICD-10-CM | POA: Diagnosis not present

## 2016-04-09 MED ORDER — CYCLOBENZAPRINE HCL 10 MG PO TABS: 10.0000 mg | ORAL_TABLET | Freq: Three times a day (TID) | ORAL | 0 refills | Status: DC | PRN

## 2016-04-09 NOTE — Progress Notes (Signed)
   Subjective:    Patient ID: Sara [name], female    DOB: [birth year], 70 y.o.   MRN: [id number]  Chest Pain   This is a new problem. The current episode started today. The onset quality is sudden. The problem occurs constantly. The problem has been unchanged. The pain is present in the substernal region. The pain is at a severity of 8/10. The pain is moderate. The quality of the pain is described as sharp. The pain does not radiate. Associated symptoms include shortness of breath (when walking-not new). Pertinent negatives include no back pain, cough, diaphoresis, dizziness, exertional chest pressure, leg pain, lower extremity edema, nausea, orthopnea, palpitations, sputum production or vomiting. She has tried nothing for the symptoms. The treatment provided no relief.      Review of Systems  Constitutional: Negative for diaphoresis.  Respiratory: Positive for shortness of breath (when walking-not new). Negative for cough and sputum production.   Cardiovascular: Positive for chest pain. Negative for palpitations and orthopnea.  Gastrointestinal: Negative for nausea and vomiting.  Musculoskeletal: Negative for back pain.  Neurological: Negative for dizziness.       Objective:   Physical Exam  Constitutional: She is oriented to person, place, and time. She appears well-developed and well-nourished.  Cardiovascular: Normal rate, regular rhythm, normal heart sounds and intact distal pulses.   Pulmonary/Chest: Effort normal and breath sounds normal.  Abdominal: Soft. Bowel sounds are normal.  Musculoskeletal: Normal range of motion. She exhibits tenderness (substernal pain with palpation ). She exhibits no edema.  Neurological: She is alert and oriented to person, place, and time.  Skin: Skin is warm and dry.  Psychiatric: She has a normal mood and affect. Her behavior is normal. Judgment and thought content normal.    EKG- Unchanged since 07/17  BP 121/78 (BP Location: Left Arm,  Patient Position: Sitting, Cuff Size: Large)   Pulse 87   Temp 97.2 F (36.2 C) (Oral)   SpO2 96%      Assessment & Plan:  1. Chest pain, unspecified chest pain type  2. Chest wall pain -Told to take Tylenol as needed, muscle relaxer given .  Sedation precautions discused - cyclobenzaprine (FLEXERIL) 10 MG tablet; Take 1 tablet (10 mg total) by mouth 3 (three) times daily as needed for muscle spasms.  Dispense: 30 tablet; Refill: 0  3. Atherosclerosis of native coronary artery without angina pectoris, unspecified whether native or transplanted heart  I believe this is related to chest wall pain. PT's EKG is unchanged since 07/17. PT told if chest pain worsen to go to ED. PT told to call Cardiologists and make follow up appt!   Evelina Dun, FNP

## 2016-04-09 NOTE — Patient Instructions (Signed)

## 2016-04-17 ENCOUNTER — Ambulatory Visit
Admission: RE | Admit: 2016-04-17 | Discharge: 2016-04-17 | Disposition: A | Discharge status: Home / Self Care | Payer: Medicare Other | Source: Ambulatory Visit | Attending: Obstetrics and Gynecology | Admitting: Obstetrics and Gynecology

## 2016-04-17 ENCOUNTER — Other Ambulatory Visit: Payer: Self-pay | Admitting: Family Medicine

## 2016-04-17 DIAGNOSIS — Z1231 Encounter for screening mammogram for malignant neoplasm of breast: Secondary | ICD-10-CM

## 2016-05-05 ENCOUNTER — Other Ambulatory Visit: Payer: Self-pay | Admitting: Family Medicine

## 2016-05-07 DIAGNOSIS — M7061 Trochanteric bursitis, right hip: Secondary | ICD-10-CM | POA: Diagnosis not present

## 2016-05-15 ENCOUNTER — Other Ambulatory Visit: Payer: Self-pay | Admitting: Family Medicine

## 2016-06-04 ENCOUNTER — Other Ambulatory Visit: Payer: Self-pay | Admitting: Family Medicine

## 2016-06-13 ENCOUNTER — Other Ambulatory Visit: Payer: Self-pay | Admitting: Family

## 2016-06-13 MED ORDER — AZITHROMYCIN 250 MG PO TABS: ORAL_TABLET | ORAL | 0 refills | Status: DC

## 2016-06-13 NOTE — Telephone Encounter (Signed)
Patients husband informed that we have sent in antibiotic to [facility]

## 2016-06-13 NOTE — Telephone Encounter (Signed)
Zpak Prescription sent to pharmacy   

## 2016-07-11 ENCOUNTER — Ambulatory Visit (INDEPENDENT_AMBULATORY_CARE_PROVIDER_SITE_OTHER): Payer: Medicare Other | Admitting: Cardiology

## 2016-07-11 ENCOUNTER — Encounter (INDEPENDENT_AMBULATORY_CARE_PROVIDER_SITE_OTHER): Payer: Self-pay

## 2016-07-11 ENCOUNTER — Encounter: Payer: Self-pay | Admitting: Cardiology

## 2016-07-11 ENCOUNTER — Other Ambulatory Visit: Payer: Self-pay | Admitting: *Deleted

## 2016-07-11 VITALS — BP 135/83 | HR 86 | Ht 64.5 in | Wt 255.0 lb

## 2016-07-11 DIAGNOSIS — E782 Mixed hyperlipidemia: Secondary | ICD-10-CM

## 2016-07-11 DIAGNOSIS — R0602 Shortness of breath: Secondary | ICD-10-CM | POA: Diagnosis not present

## 2016-07-11 DIAGNOSIS — I2510 Atherosclerotic heart disease of native coronary artery without angina pectoris: Secondary | ICD-10-CM

## 2016-07-11 DIAGNOSIS — I10 Essential (primary) hypertension: Secondary | ICD-10-CM | POA: Diagnosis not present

## 2016-07-11 LAB — BASIC METABOLIC PANEL
BUN: 16 mg/dL (ref 7–25)
CALCIUM: 9.7 mg/dL (ref 8.6–10.4)
CHLORIDE: 103 mmol/L (ref 98–110)
CO2: 25 mmol/L (ref 20–31)
CREATININE: 0.78 mg/dL (ref 0.60–0.93)
Glucose, Bld: 109 mg/dL — ABNORMAL HIGH (ref 65–99)
Potassium: 4 mmol/L (ref 3.5–5.3)
Sodium: 139 mmol/L (ref 135–146)

## 2016-07-11 LAB — LIPID PANEL
Cholesterol: 154 mg/dL (ref <200)
HDL: 52 mg/dL (ref >50)
LDL CALC: 89 mg/dL (ref <100)
TRIGLYCERIDES: 65 mg/dL (ref <150)
Total CHOL/HDL Ratio: 3 Ratio (ref <5.0)
VLDL: 13 mg/dL (ref <30)

## 2016-07-11 NOTE — Patient Instructions (Signed)
Medication Instructions:  Your physician recommends that you continue on your current medications as directed. Please refer to the Current Medication list given to you today.   Labwork: Lipid profile/BMET today  Testing/Procedures: Your physician has requested that you have a lexiscan myoview. For further information please visit [email or web address]. Please follow instruction sheet, as given.    Follow-Up: Your physician wants you to follow-up in: 1 year with Dr Percival Spanish in the [facility]. (December 2018). You will receive a reminder letter in the mail two months in advance. If you don't receive a letter, please call our office to schedule the follow-up appointment.   Any Other Special Instructions Will Be Listed Below (If Applicable).     If you need a refill on your cardiac medications before your next appointment, please call your pharmacy.

## 2016-07-13 ENCOUNTER — Telehealth: Payer: Self-pay | Admitting: Cardiology

## 2016-07-13 NOTE — Progress Notes (Signed)
Patient ID: Sara [name], female   DOB: 01-Aug-1945, 70 y.o.   MRN: [id number] PCP: Dr. [name] Heck  70 yo with history of CAD and AVNRT s/p ablation presents for cardiology followup.  She had PCI to the LAD in 10/10.  In 12/12, she was admitted to [facility] with SVT and associated chest pain and elevated troponin.  Cath in 12/12 showed patent LAD stents.  She had AVNRT ablation 3/12.  She had Adenosine Cardiolite in 5/14 with no evidence for ischemia or infarction.    She was seen a couple of times recently by Richardson Dopp for exertional dyspnea.  She had the Cardiolite as described above in 5/14.  She had an echo in 12/14 with normal EF and no significant abnormalities, and she had PFTs in 12/14 with normal volumes and spirometry and mildly abnormal DLCO.  She continued to have rather marked dyspnea with exertion.  Given unexplained exertional symptoms, I took her for left and right heart catheterization in 1/15.  This showed normal filling pressures, no pulmonary hypertension, and no obstructive CAD.    In 8/17, she had right THR.  She has had somewhat progressive exertional dyspnea.  She is short of breath walking up stairs or walking 100 feet.  No chest pain.  No orthopnea/PND.  Weight is up about 9 lbs since last seen in this office.  She has some lower extremity edema.    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 Obesity 3. HTN 4. GERD 5. OSA: Mild, not on CPAP 6. Psoriasis 7. CAD: Xience DES x 3 to LAD in 10/10.  Myoview 8/11 with EF 67%, no ischemia.  Echo (11/12): EF  55-60%, mild LVH, moderate LAE.  LHC (12/12): Patent LAD stents.  Adenosine Cardiolite (5/14) with EF 68%, no ischemia/infarction.  Echo (11/14) with EF 55-60%, mild LVH, grade I diastolic dysfunction.  LHC (1/15) with patent stents in the proximal LAD and 40% stenosis between the stents, EF 55-60%.  8. Diverticulosis 9. Chronic LBBB 10. Dyspnea: PFTs (12/14) with normal lung volumes and spirometry, mildly decreased DLCO. RHC (1/15) with mean RA 7, PA 27/12, mean PCWP 9, CI 3.35.  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  3 children.  Nonsmoker.  Retired.    ROS: All systems reviewed and negative except as per HPI.   Current Outpatient Prescriptions  Medication Sig Dispense Refill  . aspirin 81 MG chewable tablet Chew 1 tablet (81 mg total) by mouth 2 (two) times daily. 60 tablet 1  . atorvastatin (LIPITOR) 80 MG tablet TAKE 1 TABLET DAILY 90 tablet 0  . Calcium Carbonate-Vitamin D (SUPER CALCIUM 600 + D3 PO) Take 1 tablet by mouth daily.    . cyclobenzaprine (FLEXERIL) 10 MG tablet Take 1 tablet (10 mg total) by mouth 3 (three) times daily as needed for muscle spasms. 30 tablet 0  . diltiazem (CARDIZEM CD) 120 MG 24 hr capsule TAKE (1) CAPSULE DAILY (Patient taking differently: TAKE (1) CAPSULE (120 mg) DAILY) 90 capsule  3  . fluticasone (FLONASE) 50 MCG/ACT nasal spray Place 1 spray into both nostrils 2 (two) times daily as needed for allergies or rhinitis. (Patient taking differently: Place 1 spray into both nostrils at bedtime. ) 16 g 6  . furosemide (LASIX) 20 MG tablet TAKE 1 TABLET DAILY 90 tablet 1  . HYDROcodone-acetaminophen (NORCO) 5-325 MG tablet Take 1-2 tablets by mouth every 4 (four) hours as needed for moderate pain. 90 tablet 0  . losartan (COZAAR) 50 MG tablet TAKE 1 TABLET DAILY 90 tablet 0  . meclizine (ANTIVERT) 25 MG tablet Take 25 mg by mouth 2 (two) times daily as needed for dizziness. Reported on 12/05/2015    . nitroGLYCERIN (NITROSTAT) 0.4 MG  SL tablet Place 0.4 mg under the tongue every 5 (five) minutes as needed for chest pain (x 3 doses). Reported on 12/05/2015    . omeprazole (PRILOSEC) 20 MG capsule TAKE (1) CAPSULE DAILY 90 capsule 0  . ondansetron (ZOFRAN) 4 MG tablet Take 1 tablet (4 mg total) by mouth every 6 (six) hours as needed for nausea. 20 tablet 0  . potassium chloride (K-DUR) 10 MEQ tablet TAKE 1 TABLET DAILY 30 tablet 3  . potassium chloride (K-DUR,KLOR-CON) 10 MEQ tablet Take 1 tablet (10 mEq total) by mouth daily. (Patient taking differently: Take 20 mEq by mouth daily. ) 15 tablet 0  . sodium chloride (OCEAN) 0.65 % nasal spray Place 1 spray into the nose at bedtime as needed for congestion.     Marland Kitchen spironolactone (ALDACTONE) 25 MG tablet TAKE (1/2) TABLET TWICE DAILY. 90 tablet 0   No current facility-administered medications for this visit.     BP 135/83   Pulse 86   Ht 5' 4.5" (1.638 m)   Wt 255 lb (115.7 kg)   BMI 43.09 kg/m  General: NAD, obese.  Neck: No JVD, no thyromegaly or thyroid nodule.  Lungs: Clear to auscultation bilaterally with normal respiratory effort. CV: Nondisplaced PMI.  Heart regular S1/S2, no S3/S4, 1/6 SEM RUSB.  1+ ankle edema.  No carotid bruit.  Normal pedal pulses.  Abdomen: Soft, nontender, no hepatosplenomegaly, no distention.  Neurologic: Alert and oriented x 3.  Psych: Normal affect. Extremities: No clubbing or cyanosis. Right groin cath site benign.   Assessment/Plan:  AVNRT  Status post ablation in 3/13. No recent palpitations, continue diltiazem CD. Exertional Dyspnea I suspect that this has been primarily due to obesity and deconditioning.  However, symptoms have been progressive and she does have a history of CAD. - Lexiscan Cardiolite for risk stratification.  - If Cardiolite is normal, needs to start exercise regimen with an eye towards weight loss, as this will likely help her breathing.  CAD  Patent stents without obstructive disease on 1/15 cath.  Continue  ASA 81, ARB, statin. Given increasing exertional dyspnea, will arrange Cardiolite as above.  HYPERTENSION  BP is under control. She is on spironolactone so will need to follow K carefully. BMET today.  HYPERLIPIDEMIA  Check lipids today.   Given my transition to CHF clinic, she will see Dr. Percival Spanish in [place] in 1 year for followup if Cardiolite normal.     Loralie Champagne 07/13/2016

## 2016-07-13 NOTE — Telephone Encounter (Signed)
Spoke with pt, aware of lab results. 

## 2016-07-13 NOTE — Telephone Encounter (Signed)
New message    Pt verbalized that she is returning call from rn for results

## 2016-07-19 ENCOUNTER — Telehealth (HOSPITAL_COMMUNITY): Payer: Self-pay | Admitting: *Deleted

## 2016-07-19 NOTE — Telephone Encounter (Signed)
Patient given detailed instructions per Myocardial Perfusion Study Information Sheet for the test on 07/24/16. Patient notified to arrive 15 minutes early and that it is imperative to arrive on time for appointment to keep from having the test rescheduled.  If you need to cancel or reschedule your appointment, please call the office within 24 hours of your appointment. Failure to do so may result in a cancellation of your appointment, and a $50 no show fee. Patient verbalized understanding. Kirstie Peri

## 2016-07-24 ENCOUNTER — Ambulatory Visit (HOSPITAL_COMMUNITY): Payer: Medicare Other | Attending: Cardiovascular Disease

## 2016-07-24 DIAGNOSIS — R0602 Shortness of breath: Secondary | ICD-10-CM

## 2016-07-24 DIAGNOSIS — I2510 Atherosclerotic heart disease of native coronary artery without angina pectoris: Secondary | ICD-10-CM | POA: Insufficient documentation

## 2016-07-24 DIAGNOSIS — I10 Essential (primary) hypertension: Secondary | ICD-10-CM | POA: Diagnosis not present

## 2016-07-24 MED ORDER — REGADENOSON 0.4 MG/5ML IV SOLN
0.4000 mg | Freq: Once | INTRAVENOUS | Status: AC
  Administered 2016-07-24: 0.4 mg via INTRAVENOUS

## 2016-07-24 MED ORDER — TECHNETIUM TC 99M TETROFOSMIN IV KIT
32.7000 | PACK | Freq: Once | INTRAVENOUS | Status: AC | PRN
  Administered 2016-07-24: 32.7 via INTRAVENOUS
  Filled 2016-07-24: qty 33

## 2016-07-25 ENCOUNTER — Ambulatory Visit (HOSPITAL_COMMUNITY): Payer: Medicare Other | Attending: Cardiovascular Disease

## 2016-07-25 LAB — MYOCARDIAL PERFUSION IMAGING
CHL CUP NUCLEAR SRS: 6
CHL CUP NUCLEAR SSS: 10
LHR: 0.3
LV dias vol: 117 mL (ref 46–106)
LV sys vol: 41 mL
NUC STRESS TID: 0.92
Peak HR: 105 {beats}/min
Rest HR: 86 {beats}/min
SDS: 4

## 2016-07-25 MED ORDER — TECHNETIUM TC 99M TETROFOSMIN IV KIT
31.3000 | PACK | Freq: Once | INTRAVENOUS | Status: AC | PRN
  Administered 2016-07-25: 31.3 via INTRAVENOUS
  Filled 2016-07-25: qty 32

## 2016-08-03 ENCOUNTER — Other Ambulatory Visit: Payer: Self-pay | Admitting: Family

## 2016-08-30 ENCOUNTER — Other Ambulatory Visit: Payer: Self-pay | Admitting: Family Medicine

## 2016-09-01 ENCOUNTER — Other Ambulatory Visit: Payer: Self-pay | Admitting: Family Medicine

## 2016-09-01 DIAGNOSIS — R609 Edema, unspecified: Secondary | ICD-10-CM

## 2016-09-01 DIAGNOSIS — I10 Essential (primary) hypertension: Secondary | ICD-10-CM

## 2016-09-04 DIAGNOSIS — Z96641 Presence of right artificial hip joint: Secondary | ICD-10-CM | POA: Diagnosis not present

## 2016-09-04 DIAGNOSIS — Z471 Aftercare following joint replacement surgery: Secondary | ICD-10-CM | POA: Diagnosis not present

## 2016-10-17 ENCOUNTER — Ambulatory Visit (INDEPENDENT_AMBULATORY_CARE_PROVIDER_SITE_OTHER): Payer: Medicare Other | Admitting: Physician Assistant

## 2016-10-17 ENCOUNTER — Encounter: Payer: Self-pay | Admitting: Physician Assistant

## 2016-10-17 VITALS — BP 122/83 | HR 73 | Temp 97.8°F | Ht 64.0 in | Wt 257.0 lb

## 2016-10-17 DIAGNOSIS — M810 Age-related osteoporosis without current pathological fracture: Secondary | ICD-10-CM

## 2016-10-17 DIAGNOSIS — K219 Gastro-esophageal reflux disease without esophagitis: Secondary | ICD-10-CM

## 2016-10-17 DIAGNOSIS — M1990 Unspecified osteoarthritis, unspecified site: Secondary | ICD-10-CM

## 2016-10-17 DIAGNOSIS — E782 Mixed hyperlipidemia: Secondary | ICD-10-CM | POA: Diagnosis not present

## 2016-10-17 DIAGNOSIS — I10 Essential (primary) hypertension: Secondary | ICD-10-CM | POA: Diagnosis not present

## 2016-10-17 DIAGNOSIS — R5383 Other fatigue: Secondary | ICD-10-CM | POA: Diagnosis not present

## 2016-10-17 DIAGNOSIS — I2510 Atherosclerotic heart disease of native coronary artery without angina pectoris: Secondary | ICD-10-CM | POA: Diagnosis not present

## 2016-10-17 DIAGNOSIS — J019 Acute sinusitis, unspecified: Secondary | ICD-10-CM | POA: Insufficient documentation

## 2016-10-17 MED ORDER — FLUTICASONE PROPIONATE 50 MCG/ACT NA SUSP: 1.0000 | Freq: Two times a day (BID) | NASAL | 6 refills | Status: DC | PRN

## 2016-10-17 NOTE — Progress Notes (Signed)
BP 122/83   Pulse 73   Temp 97.8 F (36.6 C) (Oral)   Ht 5' 4" (1.626 m)   Wt 257 lb (116.6 kg)   BMI 44.11 kg/m    Subjective:    Patient ID: Sara [name], female    DOB: [birth year], 71 y.o.   MRN: [id number]  HPI: Sara [name] is a 71 y.o. female presenting on 10/17/2016 for Hypertension and Hyperlipidemia  This patient comes in for periodic recheck on medications and conditions including Coronary artery disease, hyperlipidemia, GERD, arthritis, allergic rhinitis, osteoporosis. Patient is needing her DEXA scheduled. We will put this in and have it scheduled here in the office. She is informed that she will receive a call about this appointment. All of her medications are reviewed today. We will send refills that are needed. She still sees her cardiologist once a year. She will see her orthopedist again in August. That will be one year from her hip replacement. Overall she is doing well. Labs will be performed today..   All medications are reviewed today. There are no reports of any problems with the medications. All of the medical conditions are reviewed and updated.  Lab work is reviewed and will be ordered as medically necessary. There are no new problems reported with today's visit.   Relevant past medical, surgical, family and social history reviewed and updated as indicated. Allergies and medications reviewed and updated.  Past Medical History:  Diagnosis Date  . Arthritis    Arthritis -hands -Bil. knee replacemnts  . AVNRT (AV nodal re-entry tachycardia) ([place])    s/p RFCA 09/2011  . Bronchitis    none recent  . CAD (coronary artery disease)    a. s/p Xience DES x 3 to LAD;  b. nuc study 02/27/10: EF 67% no ischemia;  c.  echo 11/12: Mild LVH, EF 88-41%, grade 1 diastolic dysfunction, moderate LAE;  d.  LHC 07/03/11: LAD stents patent, RCA 25%, EF 55-65% ;  e. [place]. Myoview 5/14:  No ischemia, EF 68%  . Diverticular disease   . GERD (gastroesophageal reflux disease)   .  Hemorrhoid   . Hepatitis    yellow jaundice as a child   . HTN (hypertension)   . Hx of echocardiogram    a.  Echo (05/2011):  Mild LVH, EF 55-60%, Gr 1 DD, mod LAE.b. Echo (11/14):  Mild LVH, mild focal basal septal hypertrophy, EF 55-60%, Gr 1 DD, mild LAE, atrial septal aneurysm  . Hyperlipidemia   . Hypertension   . Impaired hearing    bilateral- no hearing aids  . Jaundice   . Myocardial infarction    hx of x 2   . Obesity   . OSA (obstructive sleep apnea)    needs no cpap per patient   . Other psoriasis    ongoing- none at present.  . Shortness of breath    Exertional  . Vertigo     Past Surgical History:  Procedure Laterality Date  . ABDOMINAL HYSTERECTOMY    . BACK SURGERY     Dr. Lawernce Pitts  . CARDIAC CATHETERIZATION  08/23/2011   Ablation AV  Node  . COLONOSCOPY    . CORONARY ANGIOPLASTY     with stents 2009   . KNEE SURGERY     right  . LEFT AND RIGHT HEART CATHETERIZATION WITH CORONARY ANGIOGRAM N/A 08/18/2013   Procedure: LEFT AND RIGHT HEART CATHETERIZATION WITH CORONARY ANGIOGRAM;  Surgeon: Larey Dresser, MD;  Location: [facility] CATH  LAB;  Service: Cardiovascular;  Laterality: N/A;  . LEFT HEART CATHETERIZATION WITH CORONARY ANGIOGRAM N/A 07/03/2011   Procedure: LEFT HEART CATHETERIZATION WITH CORONARY ANGIOGRAM;  Surgeon: Minus Breeding, MD;  Location: [facility] CATH LAB;  Service: Cardiovascular;  Laterality: N/A;  . REPAIR RECTOCELE    . SUPRAVENTRICULAR TACHYCARDIA ABLATION N/A 08/23/2011   Procedure: SUPRAVENTRICULAR TACHYCARDIA ABLATION;  Surgeon: Evans Lance, MD;  Location: [facility] CATH LAB;  Service: Cardiovascular;  Laterality: N/A;  . TOTAL HIP ARTHROPLASTY Right 02/23/2016   Procedure: RIGHT TOTAL HIP ARTHROPLASTY ANTERIOR APPROACH;  Surgeon: Rod Can, MD;  Location: [facility] ORS;  Service: Orthopedics;  Laterality: Right;  . TOTAL KNEE ARTHROPLASTY Left 12/22/2012   Procedure: LEFT TOTAL KNEE ARTHROPLASTY;  Surgeon: Gearlean Alf, MD;  Location: [facility] ORS;  Service:  Orthopedics;  Laterality: Left;  . TUBAL LIGATION    . UPPER GASTROINTESTINAL ENDOSCOPY      Review of Systems  Constitutional: Negative.  Negative for activity change, fatigue and fever.  HENT: Positive for postnasal drip.   Eyes: Negative.   Respiratory: Negative.  Negative for cough, shortness of breath and wheezing.   Cardiovascular: Negative.  Negative for chest pain.  Gastrointestinal: Negative.  Negative for abdominal pain.  Endocrine: Negative.   Genitourinary: Negative.  Negative for dysuria.  Musculoskeletal: Positive for arthralgias and joint swelling.  Skin: Negative.   Neurological: Negative.     Allergies as of 10/17/2016      Reactions   Tape    bleeding   Codeine Itching   Crestor [rosuvastatin] Other (See Comments)   myalgias   Nifedipine Other (See Comments)   Brings blood pressure up really fast PROCARDIA      Medication List       Accurate as of 10/17/16 11:17 AM. Always use your most recent med list.          aspirin 81 MG chewable tablet Chew 1 tablet (81 mg total) by mouth 2 (two) times daily.   atorvastatin 80 MG tablet Commonly known as:  LIPITOR TAKE 1 TABLET DAILY   cyclobenzaprine 10 MG tablet Commonly known as:  FLEXERIL Take 1 tablet (10 mg total) by mouth 3 (three) times daily as needed for muscle spasms.   diltiazem 120 MG 24 hr capsule Commonly known as:  CARDIZEM CD TAKE (1) CAPSULE DAILY   fluticasone 50 MCG/ACT nasal spray Commonly known as:  FLONASE Place 1 spray into both nostrils 2 (two) times daily as needed for allergies or rhinitis.   furosemide 20 MG tablet Commonly known as:  LASIX TAKE 1 TABLET DAILY   HYDROcodone-acetaminophen 5-325 MG tablet Commonly known as:  NORCO Take 1-2 tablets by mouth every 4 (four) hours as needed for moderate pain.   losartan 50 MG tablet Commonly known as:  COZAAR TAKE 1 TABLET DAILY   meclizine 25 MG tablet Commonly known as:  ANTIVERT Take 25 mg by mouth 2 (two) times  daily as needed for dizziness. Reported on 12/05/2015   nitroGLYCERIN 0.4 MG SL tablet Commonly known as:  NITROSTAT Place 0.4 mg under the tongue every 5 (five) minutes as needed for chest pain (x 3 doses). Reported on 12/05/2015   omeprazole 20 MG capsule Commonly known as:  PRILOSEC TAKE (1) CAPSULE DAILY   ondansetron 4 MG tablet Commonly known as:  ZOFRAN Take 1 tablet (4 mg total) by mouth every 6 (six) hours as needed for nausea.   potassium chloride 10 MEQ tablet Commonly known as:  K-DUR,KLOR-CON Take 1 tablet (  10 mEq total) by mouth daily.   potassium chloride 10 MEQ tablet Commonly known as:  K-DUR TAKE 1 TABLET DAILY   sodium chloride 0.65 % nasal spray Commonly known as:  OCEAN Place 1 spray into the nose at bedtime as needed for congestion.   spironolactone 25 MG tablet Commonly known as:  ALDACTONE TAKE (1/2) TABLET TWICE DAILY.   SUPER CALCIUM 600 + D3 PO Take 1 tablet by mouth daily.          Objective:    BP 122/83   Pulse 73   Temp 97.8 F (36.6 C) (Oral)   Ht 5' 4" (1.626 m)   Wt 257 lb (116.6 kg)   BMI 44.11 kg/m   Allergies  Allergen Reactions  . Tape     bleeding  . Codeine Itching  . Crestor [Rosuvastatin] Other (See Comments)    myalgias  . Nifedipine Other (See Comments)    Brings blood pressure up really fast PROCARDIA    Physical Exam  Constitutional: She is oriented to person, place, and time. She appears well-developed and well-nourished.  HENT:  Head: Normocephalic and atraumatic.  Right Ear: Tympanic membrane, external ear and ear canal normal.  Left Ear: Tympanic membrane, external ear and ear canal normal.  Nose: Nose normal. No rhinorrhea.  Mouth/Throat: Oropharynx is clear and moist and mucous membranes are normal. No oropharyngeal exudate or posterior oropharyngeal erythema.  Eyes: Conjunctivae and EOM are normal. Pupils are equal, round, and reactive to light.  Neck: Normal range of motion. Neck supple.    Cardiovascular: Normal rate, regular rhythm, normal heart sounds and intact distal pulses.   Pulmonary/Chest: Effort normal and breath sounds normal.  Abdominal: Soft. Bowel sounds are normal.  Musculoskeletal:       Right hand: She exhibits decreased range of motion, tenderness, bony tenderness and deformity.       Left hand: She exhibits decreased range of motion, tenderness and deformity.       Hands: Neurological: She is alert and oriented to person, place, and time. She has normal reflexes.  Skin: Skin is warm and dry. No rash noted.  Psychiatric: She has a normal mood and affect. Her behavior is normal. Judgment and thought content normal.  Nursing note and vitals reviewed.       Assessment & Plan:   1. Acute rhinosinusitis - fluticasone (FLONASE) 50 MCG/ACT nasal spray; Place 1 spray into both nostrils 2 (two) times daily as needed for allergies or rhinitis.  Dispense: 16 g; Refill: 6  2. Essential hypertension - CMP14+EGFR - Lipid panel  3. Atherosclerosis of native coronary artery without angina pectoris, unspecified whether native or transplanted heart - CMP14+EGFR - Lipid panel  4. Gastroesophageal reflux disease, esophagitis presence not specified - CMP14+EGFR - CBC with Differential/Platelet - Lipid panel  5. Arthritis  6. Mixed hyperlipidemia - CMP14+EGFR - CBC with Differential/Platelet - Lipid panel  7. Fatigue, unspecified type - CBC with Differential/Platelet - TSH  8. Age related osteoporosis, unspecified pathological fracture presence - DG [facility] DEXA   Current Outpatient Prescriptions:  .  aspirin 81 MG chewable tablet, Chew 1 tablet (81 mg total) by mouth 2 (two) times daily., Disp: 60 tablet, Rfl: 1 .  atorvastatin (LIPITOR) 80 MG tablet, TAKE 1 TABLET DAILY, Disp: 90 tablet, Rfl: 0 .  Calcium Carbonate-Vitamin D (SUPER CALCIUM 600 + D3 PO), Take 1 tablet by mouth daily., Disp: , Rfl:  .  cyclobenzaprine (FLEXERIL) 10 MG tablet, Take 1 tablet  (10  mg total) by mouth 3 (three) times daily as needed for muscle spasms., Disp: 30 tablet, Rfl: 0 .  diltiazem (CARDIZEM CD) 120 MG 24 hr capsule, TAKE (1) CAPSULE DAILY (Patient taking differently: TAKE (1) CAPSULE (120 mg) DAILY), Disp: 90 capsule, Rfl: 3 .  fluticasone (FLONASE) 50 MCG/ACT nasal spray, Place 1 spray into both nostrils 2 (two) times daily as needed for allergies or rhinitis., Disp: 16 g, Rfl: 6 .  furosemide (LASIX) 20 MG tablet, TAKE 1 TABLET DAILY, Disp: 90 tablet, Rfl: 0 .  HYDROcodone-acetaminophen (NORCO) 5-325 MG tablet, Take 1-2 tablets by mouth every 4 (four) hours as needed for moderate pain., Disp: 90 tablet, Rfl: 0 .  losartan (COZAAR) 50 MG tablet, TAKE 1 TABLET DAILY, Disp: 90 tablet, Rfl: 0 .  meclizine (ANTIVERT) 25 MG tablet, Take 25 mg by mouth 2 (two) times daily as needed for dizziness. Reported on 12/05/2015, Disp: , Rfl:  .  nitroGLYCERIN (NITROSTAT) 0.4 MG SL tablet, Place 0.4 mg under the tongue every 5 (five) minutes as needed for chest pain (x 3 doses). Reported on 12/05/2015, Disp: , Rfl:  .  omeprazole (PRILOSEC) 20 MG capsule, TAKE (1) CAPSULE DAILY, Disp: 90 capsule, Rfl: 0 .  ondansetron (ZOFRAN) 4 MG tablet, Take 1 tablet (4 mg total) by mouth every 6 (six) hours as needed for nausea., Disp: 20 tablet, Rfl: 0 .  potassium chloride (K-DUR) 10 MEQ tablet, TAKE 1 TABLET DAILY, Disp: 90 tablet, Rfl: 0 .  potassium chloride (K-DUR,KLOR-CON) 10 MEQ tablet, Take 1 tablet (10 mEq total) by mouth daily. (Patient taking differently: Take 20 mEq by mouth daily. ), Disp: 15 tablet, Rfl: 0 .  sodium chloride (OCEAN) 0.65 % nasal spray, Place 1 spray into the nose at bedtime as needed for congestion. , Disp: , Rfl:  .  spironolactone (ALDACTONE) 25 MG tablet, TAKE (1/2) TABLET TWICE DAILY., Disp: 90 tablet, Rfl: 0  Continue all other maintenance medications as listed above.  Follow up plan: Return in about 6 months (around 04/19/2017).  Educational handout given  for [place] PA-C [place] [address]  [place], [place] [address] [phone]   10/17/2016, 11:17 AM

## 2016-10-17 NOTE — Patient Instructions (Signed)
DASH Eating Plan DASH stands for "Dietary Approaches to Stop Hypertension." The DASH eating plan is a healthy eating plan that has been shown to reduce high blood pressure (hypertension). It may also reduce your risk for type 2 diabetes, heart disease, and stroke. The DASH eating plan may also help with weight loss. What are tips for following this plan? General guidelines  Avoid eating more than 2,300 mg (milligrams) of salt (sodium) a day. If you have hypertension, you may need to reduce your sodium intake to 1,500 mg a day.  Limit alcohol intake to no more than 1 drink a day for nonpregnant women and 2 drinks a day for men. One drink equals 12 oz of beer, 5 oz of wine, or 1 oz of hard liquor.  Work with your health care provider to maintain a healthy body weight or to lose weight. Ask what an ideal weight is for you.  Get at least 30 minutes of exercise that causes your heart to beat faster (aerobic exercise) most days of the week. Activities may include walking, swimming, or biking.  Work with your health care provider or diet and nutrition specialist (dietitian) to adjust your eating plan to your individual calorie needs. Reading food labels  Check food labels for the amount of sodium per serving. Choose foods with less than 5 percent of the Daily Value of sodium. Generally, foods with less than 300 mg of sodium per serving fit into this eating plan.  To find whole grains, look for the word "whole" as the first word in the ingredient list. Shopping  Buy products labeled as "low-sodium" or "no salt added."  Buy fresh foods. Avoid canned foods and premade or frozen meals. Cooking  Avoid adding salt when cooking. Use salt-free seasonings or herbs instead of table salt or sea salt. Check with your health care provider or pharmacist before using salt substitutes.  Do not fry foods. Cook foods using healthy methods such as baking, boiling, grilling, and broiling instead.  Cook with  heart-healthy oils, such as olive, canola, soybean, or sunflower oil. Meal planning   Eat a balanced diet that includes: ? 5 or more servings of fruits and vegetables each day. At each meal, try to fill half of your plate with fruits and vegetables. ? Up to 6-8 servings of whole grains each day. ? Less than 6 oz of lean meat, poultry, or fish each day. A 3-oz serving of meat is about the same size as a deck of cards. One egg equals 1 oz. ? 2 servings of low-fat dairy each day. ? A serving of nuts, seeds, or beans 5 times each week. ? Heart-healthy fats. Healthy fats called Omega-3 fatty acids are found in foods such as flaxseeds and coldwater fish, like sardines, salmon, and mackerel.  Limit how much you eat of the following: ? Canned or prepackaged foods. ? Food that is high in trans fat, such as fried foods. ? Food that is high in saturated fat, such as fatty meat. ? Sweets, desserts, sugary drinks, and other foods with added sugar. ? Full-fat dairy products.  Do not salt foods before eating.  Try to eat at least 2 vegetarian meals each week.  Eat more home-cooked food and less restaurant, buffet, and fast food.  When eating at a restaurant, ask that your food be prepared with less salt or no salt, if possible. What foods are recommended? The items listed may not be a complete list. Talk with your dietitian about what   dietary choices are best for you. Grains Whole-grain or whole-wheat bread. Whole-grain or whole-wheat pasta. Brown rice. Oatmeal. Quinoa. Bulgur. Whole-grain and low-sodium cereals. Pita bread. Low-fat, low-sodium crackers. Whole-wheat flour tortillas. Vegetables Fresh or frozen vegetables (raw, steamed, roasted, or grilled). Low-sodium or reduced-sodium tomato and vegetable juice. Low-sodium or reduced-sodium tomato sauce and tomato paste. Low-sodium or reduced-sodium canned vegetables. Fruits All fresh, dried, or frozen fruit. Canned fruit in natural juice (without  added sugar). Meat and other protein foods Skinless chicken or turkey. Ground chicken or turkey. Pork with fat trimmed off. Fish and seafood. Egg whites. Dried beans, peas, or lentils. Unsalted nuts, nut butters, and seeds. Unsalted canned beans. Lean cuts of beef with fat trimmed off. Low-sodium, lean deli meat. Dairy Low-fat (1%) or fat-free (skim) milk. Fat-free, low-fat, or reduced-fat cheeses. Nonfat, low-sodium ricotta or cottage cheese. Low-fat or nonfat yogurt. Low-fat, low-sodium cheese. Fats and oils Soft margarine without trans fats. Vegetable oil. Low-fat, reduced-fat, or light mayonnaise and salad dressings (reduced-sodium). Canola, safflower, olive, soybean, and sunflower oils. Avocado. Seasoning and other foods Herbs. Spices. Seasoning mixes without salt. Unsalted popcorn and pretzels. Fat-free sweets. What foods are not recommended? The items listed may not be a complete list. Talk with your dietitian about what dietary choices are best for you. Grains Baked goods made with fat, such as croissants, muffins, or some breads. Dry pasta or rice meal packs. Vegetables Creamed or fried vegetables. Vegetables in a cheese sauce. Regular canned vegetables (not low-sodium or reduced-sodium). Regular canned tomato sauce and paste (not low-sodium or reduced-sodium). Regular tomato and vegetable juice (not low-sodium or reduced-sodium). Pickles. Olives. Fruits Canned fruit in a light or heavy syrup. Fried fruit. Fruit in cream or butter sauce. Meat and other protein foods Fatty cuts of meat. Ribs. Fried meat. Bacon. Sausage. Bologna and other processed lunch meats. Salami. Fatback. Hotdogs. Bratwurst. Salted nuts and seeds. Canned beans with added salt. Canned or smoked fish. Whole eggs or egg yolks. Chicken or turkey with skin. Dairy Whole or 2% milk, cream, and half-and-half. Whole or full-fat cream cheese. Whole-fat or sweetened yogurt. Full-fat cheese. Nondairy creamers. Whipped toppings.  Processed cheese and cheese spreads. Fats and oils Butter. Stick margarine. Lard. Shortening. Ghee. Bacon fat. Tropical oils, such as coconut, palm kernel, or palm oil. Seasoning and other foods Salted popcorn and pretzels. Onion salt, garlic salt, seasoned salt, table salt, and sea salt. Worcestershire sauce. Tartar sauce. Barbecue sauce. Teriyaki sauce. Soy sauce, including reduced-sodium. Steak sauce. Canned and packaged gravies. Fish sauce. Oyster sauce. Cocktail sauce. Horseradish that you find on the shelf. Ketchup. Mustard. Meat flavorings and tenderizers. Bouillon cubes. Hot sauce and Tabasco sauce. Premade or packaged marinades. Premade or packaged taco seasonings. Relishes. Regular salad dressings. Where to find more information:  National Heart, Lung, and Blood Institute: www.nhlbi.nih.gov  American Heart Association: www.heart.org Summary  The DASH eating plan is a healthy eating plan that has been shown to reduce high blood pressure (hypertension). It may also reduce your risk for type 2 diabetes, heart disease, and stroke.  With the DASH eating plan, you should limit salt (sodium) intake to 2,300 mg a day. If you have hypertension, you may need to reduce your sodium intake to 1,500 mg a day.  When on the DASH eating plan, aim to eat more fresh fruits and vegetables, whole grains, lean proteins, low-fat dairy, and heart-healthy fats.  Work with your health care provider or diet and nutrition specialist (dietitian) to adjust your eating plan to your individual   calorie needs. This information is not intended to replace advice given to you by your health care provider. Make sure you discuss any questions you have with your health care provider. Document Released: 06/28/2011 Document Revised: 07/02/2016 Document Reviewed: 07/02/2016 Elsevier Interactive Patient Education  2017 [employer].  

## 2016-10-18 LAB — CMP14+EGFR
A/G RATIO: 1.7 (ref 1.2–2.2)
ALBUMIN: 3.9 g/dL (ref 3.5–4.8)
ALT: 16 IU/L (ref 0–32)
AST: 23 IU/L (ref 0–40)
Alkaline Phosphatase: 78 IU/L (ref 39–117)
BUN / CREAT RATIO: 10 — AB (ref 12–28)
BUN: 8 mg/dL (ref 8–27)
Bilirubin Total: 0.9 mg/dL (ref 0.0–1.2)
CALCIUM: 9.7 mg/dL (ref 8.7–10.3)
CO2: 26 mmol/L (ref 18–29)
CREATININE: 0.78 mg/dL (ref 0.57–1.00)
Chloride: 104 mmol/L (ref 96–106)
GFR, EST AFRICAN AMERICAN: 88 mL/min/{1.73_m2} (ref >59)
GFR, EST NON AFRICAN AMERICAN: 77 mL/min/{1.73_m2} (ref >59)
GLOBULIN, TOTAL: 2.3 g/dL (ref 1.5–4.5)
Glucose: 115 mg/dL — ABNORMAL HIGH (ref 65–99)
POTASSIUM: 4.6 mmol/L (ref 3.5–5.2)
Sodium: 143 mmol/L (ref 134–144)
Total Protein: 6.2 g/dL (ref 6.0–8.5)

## 2016-10-18 LAB — CBC WITH DIFFERENTIAL/PLATELET
Basophils Absolute: 0 10*3/uL (ref 0.0–0.2)
Basos: 0 %
EOS (ABSOLUTE): 0.2 10*3/uL (ref 0.0–0.4)
EOS: 4 %
HEMATOCRIT: 42 % (ref 34.0–46.6)
Hemoglobin: 13.9 g/dL (ref 11.1–15.9)
IMMATURE GRANULOCYTES: 0 %
Immature Grans (Abs): 0 10*3/uL (ref 0.0–0.1)
LYMPHS ABS: 2 10*3/uL (ref 0.7–3.1)
Lymphs: 38 %
MCH: 30.2 pg (ref 26.6–33.0)
MCHC: 33.1 g/dL (ref 31.5–35.7)
MCV: 91 fL (ref 79–97)
MONOS ABS: 0.3 10*3/uL (ref 0.1–0.9)
Monocytes: 6 %
NEUTROS ABS: 2.8 10*3/uL (ref 1.4–7.0)
Neutrophils: 52 %
PLATELETS: 178 10*3/uL (ref 150–379)
RBC: 4.61 x10E6/uL (ref 3.77–5.28)
RDW: 17.1 % — AB (ref 12.3–15.4)
WBC: 5.3 10*3/uL (ref 3.4–10.8)

## 2016-10-18 LAB — LIPID PANEL
CHOL/HDL RATIO: 3 ratio (ref 0.0–4.4)
Cholesterol, Total: 158 mg/dL (ref 100–199)
HDL: 52 mg/dL (ref >39)
LDL Calculated: 90 mg/dL (ref 0–99)
Triglycerides: 79 mg/dL (ref 0–149)
VLDL Cholesterol Cal: 16 mg/dL (ref 5–40)

## 2016-10-18 LAB — TSH: TSH: 2.5 u[IU]/mL (ref 0.450–4.500)

## 2016-11-01 ENCOUNTER — Other Ambulatory Visit: Payer: Self-pay | Admitting: Family

## 2016-11-28 ENCOUNTER — Other Ambulatory Visit: Payer: Self-pay | Admitting: Family Medicine

## 2016-11-28 DIAGNOSIS — I10 Essential (primary) hypertension: Secondary | ICD-10-CM

## 2016-11-28 DIAGNOSIS — R609 Edema, unspecified: Secondary | ICD-10-CM

## 2016-11-29 DIAGNOSIS — H524 Presbyopia: Secondary | ICD-10-CM | POA: Diagnosis not present

## 2016-11-29 DIAGNOSIS — H52203 Unspecified astigmatism, bilateral: Secondary | ICD-10-CM | POA: Diagnosis not present

## 2016-11-29 DIAGNOSIS — H2513 Age-related nuclear cataract, bilateral: Secondary | ICD-10-CM | POA: Diagnosis not present

## 2016-12-24 ENCOUNTER — Telehealth: Payer: Self-pay | Admitting: Family Medicine

## 2016-12-24 NOTE — Telephone Encounter (Signed)
Pt had at home

## 2017-01-14 ENCOUNTER — Other Ambulatory Visit: Payer: Self-pay

## 2017-01-14 MED ORDER — HYOSCYAMINE SULFATE ER 0.375 MG PO TB12: 0.3750 mg | ORAL_TABLET | Freq: Two times a day (BID) | ORAL | 2 refills | Status: DC

## 2017-01-14 NOTE — Telephone Encounter (Signed)
This RX faxed over from [facility]  Not on med list

## 2017-01-28 ENCOUNTER — Other Ambulatory Visit: Payer: Self-pay | Admitting: Cardiology

## 2017-01-30 NOTE — Telephone Encounter (Signed)
Followed by Dr. Percival Spanish

## 2017-02-19 ENCOUNTER — Ambulatory Visit (INDEPENDENT_AMBULATORY_CARE_PROVIDER_SITE_OTHER): Payer: Medicare Other | Admitting: Physician Assistant

## 2017-02-19 ENCOUNTER — Encounter: Payer: Self-pay | Admitting: Physician Assistant

## 2017-02-19 VITALS — BP 139/67 | HR 96 | Temp 98.9°F | Ht 64.0 in | Wt 254.2 lb

## 2017-02-19 DIAGNOSIS — S70361A Insect bite (nonvenomous), right thigh, initial encounter: Secondary | ICD-10-CM

## 2017-02-19 DIAGNOSIS — W57XXXA Bitten or stung by nonvenomous insect and other nonvenomous arthropods, initial encounter: Secondary | ICD-10-CM

## 2017-02-19 MED ORDER — DOXYCYCLINE HYCLATE 100 MG PO TABS: 100.0000 mg | ORAL_TABLET | Freq: Two times a day (BID) | ORAL | 0 refills | Status: DC

## 2017-02-19 NOTE — Patient Instructions (Signed)
In a few days you may receive a survey in the mail or online from [employer] regarding your visit with us today. Please take a moment to fill this out. Your feedback is very important to our whole office. It can help us better understand your needs as well as improve your experience and satisfaction. Thank you for taking your time to complete it. We care about you.  Jawana Reagor, PA-C  

## 2017-02-19 NOTE — Progress Notes (Signed)
BP 139/67   Pulse 96   Temp 98.9 F (37.2 C) (Oral)   Ht 5\' 4"  (1.626 m)   Wt 254 lb 3.2 oz (115.3 kg)   BMI 43.63 kg/m    Subjective:    Patient ID: Sara [name], female    DOB: [birth year], 71 y.o.   MRN: [id number]  HPI: Sara [name] is a 71 y.o. female presenting on 02/19/2017 for Bite on right thigh  Has a red swollen area on the right inner thigh. It is warm to the touch. It has not drained. There is not been pus. There is never been a blister. She denies any fever or chills. She states that overall she has felt somewhat tired and had a little bit of headache. She does not know what could have bitten her.  Relevant past medical, surgical, family and social history reviewed and updated as indicated. Allergies and medications reviewed and updated.  Past Medical History:  Diagnosis Date  . Arthritis    Arthritis -hands -Bil. knee replacemnts  . AVNRT (AV nodal re-entry tachycardia) ([place])    s/p RFCA 09/2011  . Bronchitis    none recent  . CAD (coronary artery disease)    a. s/p Xience DES x 3 to LAD;  b. nuc study 02/27/10: EF 67% no ischemia;  c.  echo 11/12: Mild LVH, EF 49-70%, grade 1 diastolic dysfunction, moderate LAE;  d.  LHC 07/03/11: LAD stents patent, RCA 25%, EF 55-65% ;  e. Adeno. Myoview 5/14:  No ischemia, EF 68%  . Diverticular disease   . GERD (gastroesophageal reflux disease)   . Hemorrhoid   . Hepatitis    yellow jaundice as a child   . HTN (hypertension)   . Hx of echocardiogram    a.  Echo (05/2011):  Mild LVH, EF 55-60%, Gr 1 DD, mod LAE.b. Echo (11/14):  Mild LVH, mild focal basal septal hypertrophy, EF 55-60%, Gr 1 DD, mild LAE, atrial septal aneurysm  . Hyperlipidemia   . Hypertension   . Impaired hearing    bilateral- no hearing aids  . Jaundice   . Myocardial infarction (HCC)    hx of x 2   . Obesity   . OSA (obstructive sleep apnea)    needs no cpap per patient   . Other psoriasis    ongoing- none at present.  . Shortness of breath    Exertional  . Vertigo     Past Surgical History:  Procedure Laterality Date  . ABDOMINAL HYSTERECTOMY    . BACK SURGERY     Dr. Lawernce Pitts  . CARDIAC CATHETERIZATION  08/23/2011   Ablation AV  Node  . COLONOSCOPY    . CORONARY ANGIOPLASTY     with stents 2009   . KNEE SURGERY     right  . LEFT AND RIGHT HEART CATHETERIZATION WITH CORONARY ANGIOGRAM N/A 08/18/2013   Procedure: LEFT AND RIGHT HEART CATHETERIZATION WITH CORONARY ANGIOGRAM;  Surgeon: Larey Dresser, MD;  Location: [facility] CATH LAB;  Service: Cardiovascular;  Laterality: N/A;  . LEFT HEART CATHETERIZATION WITH CORONARY ANGIOGRAM N/A 07/03/2011   Procedure: LEFT HEART CATHETERIZATION WITH CORONARY ANGIOGRAM;  Surgeon: Minus Breeding, MD;  Location: [facility] CATH LAB;  Service: Cardiovascular;  Laterality: N/A;  . REPAIR RECTOCELE    . SUPRAVENTRICULAR TACHYCARDIA ABLATION N/A 08/23/2011   Procedure: SUPRAVENTRICULAR TACHYCARDIA ABLATION;  Surgeon: Evans Lance, MD;  Location: [facility] CATH LAB;  Service: Cardiovascular;  Laterality: N/A;  . TOTAL HIP ARTHROPLASTY Right  02/23/2016   Procedure: RIGHT TOTAL HIP ARTHROPLASTY ANTERIOR APPROACH;  Surgeon: Rod Can, MD;  Location: [facility] ORS;  Service: Orthopedics;  Laterality: Right;  . TOTAL KNEE ARTHROPLASTY Left 12/22/2012   Procedure: LEFT TOTAL KNEE ARTHROPLASTY;  Surgeon: Gearlean Alf, MD;  Location: [facility] ORS;  Service: Orthopedics;  Laterality: Left;  . TUBAL LIGATION    . UPPER GASTROINTESTINAL ENDOSCOPY      Review of Systems  Constitutional: Negative.   HENT: Negative.   Eyes: Negative.   Respiratory: Negative.   Gastrointestinal: Negative.   Genitourinary: Negative.   Skin: Positive for color change and wound.    Allergies as of 02/19/2017      Reactions   Tape    bleeding   Codeine Itching   Crestor [rosuvastatin] Other (See Comments)   myalgias   Nifedipine Other (See Comments)   Brings blood pressure up really fast PROCARDIA      Medication List       Accurate as of  02/19/17  6:26 PM. Always use your most recent med list.          aspirin 81 MG chewable tablet Chew 1 tablet (81 mg total) by mouth 2 (two) times daily.   atorvastatin 80 MG tablet Commonly known as:  LIPITOR TAKE 1 TABLET DAILY   cyclobenzaprine 10 MG tablet Commonly known as:  FLEXERIL Take 1 tablet (10 mg total) by mouth 3 (three) times daily as needed for muscle spasms.   diltiazem 120 MG 24 hr capsule Commonly known as:  CARDIZEM CD TAKE (1) CAPSULE DAILY   doxycycline 100 MG tablet Commonly known as:  VIBRA-TABS Take 1 tablet (100 mg total) by mouth 2 (two) times daily. 1 po bid   fluticasone 50 MCG/ACT nasal spray Commonly known as:  FLONASE Place 1 spray into both nostrils 2 (two) times daily as needed for allergies or rhinitis.   furosemide 20 MG tablet Commonly known as:  LASIX TAKE 1 TABLET DAILY   HYDROcodone-acetaminophen 5-325 MG tablet Commonly known as:  NORCO Take 1-2 tablets by mouth every 4 (four) hours as needed for moderate pain.   hyoscyamine 0.375 MG 12 hr tablet Commonly known as:  LEVBID Take 1 tablet (0.375 mg total) by mouth 2 (two) times daily.   losartan 50 MG tablet Commonly known as:  COZAAR TAKE 1 TABLET DAILY   meclizine 25 MG tablet Commonly known as:  ANTIVERT Take 25 mg by mouth 2 (two) times daily as needed for dizziness. Reported on 12/05/2015   nitroGLYCERIN 0.4 MG SL tablet Commonly known as:  NITROSTAT Place 0.4 mg under the tongue every 5 (five) minutes as needed for chest pain (x 3 doses). Reported on 12/05/2015   omeprazole 20 MG capsule Commonly known as:  PRILOSEC TAKE (1) CAPSULE DAILY   ondansetron 4 MG tablet Commonly known as:  ZOFRAN Take 1 tablet (4 mg total) by mouth every 6 (six) hours as needed for nausea.   potassium chloride 10 MEQ tablet Commonly known as:  K-DUR,KLOR-CON Take 1 tablet (10 mEq total) by mouth daily.   sodium chloride 0.65 % nasal spray Commonly known as:  OCEAN Place 1 spray into  the nose at bedtime as needed for congestion.   spironolactone 25 MG tablet Commonly known as:  ALDACTONE TAKE (1/2) TABLET TWICE DAILY.   SUPER CALCIUM 600 + D3 PO Take 1 tablet by mouth daily.          Objective:    BP 139/67  Pulse 96   Temp 98.9 F (37.2 C) (Oral)   Ht 5\' 4"  (1.626 m)   Wt 254 lb 3.2 oz (115.3 kg)   BMI 43.63 kg/m   Allergies  Allergen Reactions  . Tape     bleeding  . Codeine Itching  . Crestor [Rosuvastatin] Other (See Comments)    myalgias  . Nifedipine Other (See Comments)    Brings blood pressure up really fast PROCARDIA    Physical Exam  Constitutional: She is oriented to person, place, and time. She appears well-developed and well-nourished.  HENT:  Head: Normocephalic and atraumatic.  Eyes: Pupils are equal, round, and reactive to light. Conjunctivae and EOM are normal.  Cardiovascular: Normal rate, regular rhythm, normal heart sounds and intact distal pulses.   Pulmonary/Chest: Effort normal and breath sounds normal.  Abdominal: Soft. Bowel sounds are normal.  Neurological: She is alert and oriented to person, place, and time. She has normal reflexes.  Skin: Skin is warm and dry. Lesion and rash noted. Rash is maculopapular. There is erythema.     Psychiatric: She has a normal mood and affect. Her behavior is normal. Judgment and thought content normal.        Assessment & Plan:   1. Insect bite, initial encounter - doxycycline (VIBRA-TABS) 100 MG tablet; Take 1 tablet (100 mg total) by mouth 2 (two) times daily. 1 po bid  Dispense: 20 tablet; Refill: 0   Current Outpatient Prescriptions:  .  aspirin 81 MG chewable tablet, Chew 1 tablet (81 mg total) by mouth 2 (two) times daily., Disp: 60 tablet, Rfl: 1 .  atorvastatin (LIPITOR) 80 MG tablet, TAKE 1 TABLET DAILY, Disp: 90 tablet, Rfl: 1 .  Calcium Carbonate-Vitamin D (SUPER CALCIUM 600 + D3 PO), Take 1 tablet by mouth daily., Disp: , Rfl:  .  cyclobenzaprine (FLEXERIL) 10 MG  tablet, Take 1 tablet (10 mg total) by mouth 3 (three) times daily as needed for muscle spasms., Disp: 30 tablet, Rfl: 0 .  diltiazem (CARDIZEM CD) 120 MG 24 hr capsule, TAKE (1) CAPSULE DAILY, Disp: 90 capsule, Rfl: 0 .  fluticasone (FLONASE) 50 MCG/ACT nasal spray, Place 1 spray into both nostrils 2 (two) times daily as needed for allergies or rhinitis., Disp: 16 g, Rfl: 6 .  furosemide (LASIX) 20 MG tablet, TAKE 1 TABLET DAILY, Disp: 90 tablet, Rfl: 1 .  HYDROcodone-acetaminophen (NORCO) 5-325 MG tablet, Take 1-2 tablets by mouth every 4 (four) hours as needed for moderate pain., Disp: 90 tablet, Rfl: 0 .  hyoscyamine (LEVBID) 0.375 MG 12 hr tablet, Take 1 tablet (0.375 mg total) by mouth 2 (two) times daily., Disp: 60 tablet, Rfl: 2 .  losartan (COZAAR) 50 MG tablet, TAKE 1 TABLET DAILY, Disp: 90 tablet, Rfl: 1 .  meclizine (ANTIVERT) 25 MG tablet, Take 25 mg by mouth 2 (two) times daily as needed for dizziness. Reported on 12/05/2015, Disp: , Rfl:  .  nitroGLYCERIN (NITROSTAT) 0.4 MG SL tablet, Place 0.4 mg under the tongue every 5 (five) minutes as needed for chest pain (x 3 doses). Reported on 12/05/2015, Disp: , Rfl:  .  omeprazole (PRILOSEC) 20 MG capsule, TAKE (1) CAPSULE DAILY, Disp: 90 capsule, Rfl: 1 .  ondansetron (ZOFRAN) 4 MG tablet, Take 1 tablet (4 mg total) by mouth every 6 (six) hours as needed for nausea., Disp: 20 tablet, Rfl: 0 .  potassium chloride (K-DUR,KLOR-CON) 10 MEQ tablet, Take 1 tablet (10 mEq total) by mouth daily. (Patient taking differently: Take 20  mEq by mouth daily. ), Disp: 15 tablet, Rfl: 0 .  sodium chloride (OCEAN) 0.65 % nasal spray, Place 1 spray into the nose at bedtime as needed for congestion. , Disp: , Rfl:  .  spironolactone (ALDACTONE) 25 MG tablet, TAKE (1/2) TABLET TWICE DAILY., Disp: 90 tablet, Rfl: 1 .  doxycycline (VIBRA-TABS) 100 MG tablet, Take 1 tablet (100 mg total) by mouth 2 (two) times daily. 1 po bid, Disp: 20 tablet, Rfl: 0  Continue all  other maintenance medications as listed above.  Follow up plan: Return if symptoms worsen or fail to improve.  Educational handout given for [place] PA-C [address]  [place], [place] [address] [phone]   02/19/2017, 6:26 PM

## 2017-02-26 DIAGNOSIS — Z471 Aftercare following joint replacement surgery: Secondary | ICD-10-CM | POA: Diagnosis not present

## 2017-02-26 DIAGNOSIS — Z96641 Presence of right artificial hip joint: Secondary | ICD-10-CM | POA: Diagnosis not present

## 2017-02-26 DIAGNOSIS — M1611 Unilateral primary osteoarthritis, right hip: Secondary | ICD-10-CM | POA: Diagnosis not present

## 2017-03-04 ENCOUNTER — Encounter: Payer: Self-pay | Admitting: Cardiology

## 2017-03-19 ENCOUNTER — Encounter: Payer: Self-pay | Admitting: Cardiology

## 2017-03-19 NOTE — Progress Notes (Signed)
Cardiology Office Note   Date:  03/20/2017   ID:  Sara [name], Sara [name] 1946/06/07, MRN [id number]  PCP:  Terald Sleeper, PA-C  Cardiologist:   Minus Breeding, MD  Referring:  Terald Sleeper, PA-C  Chief Complaint  Patient presents with  . Coronary Artery Disease      History of Present Illness: Sara [name] is a 71 y.o. female who presents for follow up of CAD and AVNRT.  She had PCI to the LAD in 10/10.  In 12/12, she was admitted to [facility] with SVT and associated chest pain and elevated troponin.  Cath in 12/12 showed patent LAD stents. She had a right and a left heart cath in 2015 for dyspnea.  She had patent arteries.  She had normal right heart pressures.  I reviewed these readings.   She had AVNRT ablation 3/12.  She had Adenosine Cardiolite in 1/18  with no evidence for ischemia or infarction.   She was seeing Dr. Aundra Dubin and is moving her care to [facility].    Since I last saw her she has done well.  [facility] today.  She does exercises 3 x per week.  The patient denies any new symptoms such as chest discomfort, neck or arm discomfort. There has been no new shortness of breath, PND or orthopnea. There have been no reported palpitations, presyncope or syncope.  She does get SOB with activity such as climbing the stairs.  This has been chronic.   Past Medical History:  Diagnosis Date  . Arthritis    Arthritis -hands -Bil. knee replacemnts  . AVNRT (AV nodal re-entry tachycardia) ([place])    s/p RFCA 09/2011  . Bronchitis    none recent  . CAD (coronary artery disease)    a. s/p Xience DES x 3 to LAD;  b. nuc study 02/27/10: EF 67% no ischemia;  c.  echo 11/12: Mild LVH, EF 81-01%, grade 1 diastolic dysfunction, moderate LAE;  d.  LHC 07/03/11: LAD stents patent, RCA 25%, EF 55-65% ;  e. Adeno. Myoview 5/14:  No ischemia, EF 68%  . Diverticular disease   . GERD (gastroesophageal reflux disease)   . Hemorrhoid   . Hepatitis    yellow jaundice as a child   . HTN (hypertension)   . Hx of echocardiogram    a.  Echo (05/2011):  Mild LVH, EF 55-60%, Gr 1 DD, mod LAE.b. Echo (11/14):  Mild LVH, mild focal basal septal hypertrophy, EF 55-60%, Gr 1 DD, mild LAE, atrial septal aneurysm  . Hyperlipidemia   . Hypertension   . Impaired hearing    bilateral- no hearing aids  . Jaundice   . Myocardial infarction (HCC)    hx of x 2   . Obesity   . OSA (obstructive sleep apnea)    needs no cpap per patient   . Other psoriasis    ongoing- none at present.  . Vertigo     Past Surgical History:  Procedure Laterality Date  . ABDOMINAL HYSTERECTOMY    . BACK SURGERY     Dr. Lawernce Pitts  . CARDIAC CATHETERIZATION  08/23/2011   Ablation AV  Node  . COLONOSCOPY    . CORONARY ANGIOPLASTY     with stents 2009   . KNEE SURGERY     right  . LEFT AND RIGHT HEART CATHETERIZATION WITH CORONARY ANGIOGRAM N/A 08/18/2013   Procedure: LEFT  AND RIGHT HEART CATHETERIZATION WITH CORONARY ANGIOGRAM;  Surgeon: Larey Dresser, MD;  Location: [facility] CATH LAB;  Service: Cardiovascular;  Laterality: N/A;  . LEFT HEART CATHETERIZATION WITH CORONARY ANGIOGRAM N/A 07/03/2011   Procedure: LEFT HEART CATHETERIZATION WITH CORONARY ANGIOGRAM;  Surgeon: Minus Breeding, MD;  Location: [facility] CATH LAB;  Service: Cardiovascular;  Laterality: N/A;  . REPAIR RECTOCELE    . SUPRAVENTRICULAR TACHYCARDIA ABLATION N/A 08/23/2011   Procedure: SUPRAVENTRICULAR TACHYCARDIA ABLATION;  Surgeon: Evans Lance, MD;  Location: [facility] CATH LAB;  Service: Cardiovascular;  Laterality: N/A;  . TOTAL HIP ARTHROPLASTY Right 02/23/2016   Procedure: RIGHT TOTAL HIP ARTHROPLASTY ANTERIOR APPROACH;  Surgeon: Rod Can, MD;  Location: [facility] ORS;  Service: Orthopedics;  Laterality: Right;  . TOTAL KNEE ARTHROPLASTY Left 12/22/2012   Procedure: LEFT TOTAL KNEE ARTHROPLASTY;  Surgeon: Gearlean Alf, MD;  Location: [facility] ORS;  Service: Orthopedics;  Laterality: Left;  . TUBAL LIGATION    . UPPER  GASTROINTESTINAL ENDOSCOPY       Current Outpatient Prescriptions  Medication Sig Dispense Refill  . aspirin 81 MG chewable tablet Chew 1 tablet (81 mg total) by mouth 2 (two) times daily. 60 tablet 1  . atorvastatin (LIPITOR) 80 MG tablet TAKE 1 TABLET DAILY 90 tablet 1  . Calcium Carbonate-Vitamin D (SUPER CALCIUM 600 + D3 PO) Take 1 tablet by mouth daily.    Marland Kitchen diltiazem (CARDIZEM CD) 120 MG 24 hr capsule TAKE (1) CAPSULE DAILY 90 capsule 0  . fluticasone (FLONASE) 50 MCG/ACT nasal spray Place 1 spray into both nostrils 2 (two) times daily as needed for allergies or rhinitis. 16 g 6  . furosemide (LASIX) 20 MG tablet TAKE 1 TABLET DAILY 90 tablet 1  . HYDROcodone-acetaminophen (NORCO) 5-325 MG tablet Take 1-2 tablets by mouth every 4 (four) hours as needed for moderate pain. 90 tablet 0  . losartan (COZAAR) 50 MG tablet TAKE 1 TABLET DAILY 90 tablet 1  . meclizine (ANTIVERT) 25 MG tablet Take 25 mg by mouth 2 (two) times daily as needed for dizziness. Reported on 12/05/2015    . nitroGLYCERIN (NITROSTAT) 0.4 MG SL tablet Place 0.4 mg under the tongue every 5 (five) minutes as needed for chest pain (x 3 doses). Reported on 12/05/2015    . omeprazole (PRILOSEC) 20 MG capsule TAKE (1) CAPSULE DAILY 90 capsule 1  . potassium chloride (K-DUR) 10 MEQ tablet Take 10 mEq by mouth 2 (two) times daily.    Marland Kitchen spironolactone (ALDACTONE) 25 MG tablet TAKE (1/2) TABLET TWICE DAILY. 90 tablet 1  . hyoscyamine (LEVBID) 0.375 MG 12 hr tablet Take 1 tablet (0.375 mg total) by mouth 2 (two) times daily. (Patient not taking: Reported on 03/20/2017) 60 tablet 2   No current facility-administered medications for this visit.     Allergies:   Nifedipine; Tape; Codeine; and Crestor [rosuvastatin]     ROS:  Please see the history of present illness.   Otherwise, review of systems are positive for none.   All other systems are reviewed and negative.    PHYSICAL EXAM: VS:  BP 124/66   Pulse 72   Ht 5' 4.5"  (1.638 m)   Wt 252 lb (114.3 kg)   BMI 42.59 kg/m  , BMI Body mass index is 42.59 kg/m. GENERAL:  Well appearing HEENT:  Pupils equal round and reactive, fundi not visualized, oral mucosa unremarkable NECK:  No jugular venous distention, waveform within normal limits, carotid upstroke brisk and symmetric, no bruits, no thyromegaly LYMPHATICS:  No cervical, inguinal adenopathy LUNGS:  Clear to auscultation bilaterally BACK:  No CVA tenderness CHEST:  Unremarkable HEART:  PMI not displaced or sustained,S1 and S2 within normal limits, no S3, no S4, no clicks, no rubs, no murmurs ABD:  Flat, positive bowel sounds normal in frequency in pitch, no bruits, no rebound, no guarding, no midline pulsatile mass, no hepatomegaly, no splenomegaly EXT:  2 plus pulses throughout, no edema, no cyanosis no clubbing SKIN:  No rashes no nodules NEURO:  Cranial nerves II through XII grossly intact, motor grossly intact throughout PSYCH:  Cognitively intact, oriented to person place and time    EKG:  EKG is ordered today. The ekg ordered today demonstrates sinus rhythm, rate 72, left bundle branch block, left axis deviation, premature ectopic complexes.   Recent Labs: 10/17/2016: ALT 16; BUN 8; Creatinine, Ser 0.78; Hemoglobin 13.9; Platelets 178; Potassium 4.6; Sodium 143; TSH 2.500    Lipid Panel    Component Value Date/Time   CHOL 158 10/17/2016 0950   TRIG 79 10/17/2016 0950   HDL 52 10/17/2016 0950   CHOLHDL 3.0 10/17/2016 0950   CHOLHDL 3.0 07/11/2016 1507   VLDL 13 07/11/2016 1507   LDLCALC 90 10/17/2016 0950      Wt Readings from Last 3 Encounters:  03/20/17 252 lb (114.3 kg)  02/19/17 254 lb 3.2 oz (115.3 kg)  10/17/16 257 lb (116.6 kg)      Other studies Reviewed: Additional studies/ records that were reviewed today include: labs and previous caths. Review of the above records demonstrates:  Please see elsewhere in the note.     ASSESSMENT AND PLAN:    CAD:  The patient  has no new sypmtoms.  No further cardiovascular testing is indicated.  We will continue with aggressive risk reduction and meds as listed.  AVNRT:  She has no recurrent symptoms of this.  No change in therapy is indicated.   OBESITY:  We talked about diet and exercise and specific instructions.   DYSPNEA:  This is ongoing.  I reviewed the extensive previous work up and I would surmise that this is related to morbid obesity.  She had PFTs as well and I reviewed these.  No further work up would be suggested.   LBBB:  This is chronic.  She does not feel the PVCs.  No change in therapy is planned.     Current medicines are reviewed at length with the patient today.  The patient does not have concerns regarding medicines.  The following changes have been made:  no change  Labs/ tests ordered today include: None  Orders Placed This Encounter  Procedures  . EKG 12-Lead     Disposition:   FU with me in six months.     Signed, Minus Breeding, MD  03/20/2017 3:10 PM    [facility]

## 2017-03-20 ENCOUNTER — Ambulatory Visit (INDEPENDENT_AMBULATORY_CARE_PROVIDER_SITE_OTHER): Payer: Medicare Other | Admitting: Cardiology

## 2017-03-20 ENCOUNTER — Encounter: Payer: Self-pay | Admitting: Cardiology

## 2017-03-20 VITALS — BP 124/66 | HR 72 | Ht 64.5 in | Wt 252.0 lb

## 2017-03-20 DIAGNOSIS — I2510 Atherosclerotic heart disease of native coronary artery without angina pectoris: Secondary | ICD-10-CM | POA: Insufficient documentation

## 2017-03-20 DIAGNOSIS — I447 Left bundle-branch block, unspecified: Secondary | ICD-10-CM | POA: Diagnosis not present

## 2017-03-20 DIAGNOSIS — I10 Essential (primary) hypertension: Secondary | ICD-10-CM | POA: Diagnosis not present

## 2017-03-20 DIAGNOSIS — R0602 Shortness of breath: Secondary | ICD-10-CM

## 2017-03-20 NOTE — Patient Instructions (Signed)
Medication Instructions:  The current medical regimen is effective;  continue present plan and medications.  Follow-Up: Follow up in 6 months with Dr. Hochrein.  You will receive a letter in the mail 2 months before you are due.  Please call us when you receive this letter to schedule your follow up appointment.  If you need a refill on your cardiac medications before your next appointment, please call your pharmacy.  Thank you for choosing [facility]!!       

## 2017-03-21 DIAGNOSIS — M1812 Unilateral primary osteoarthritis of first carpometacarpal joint, left hand: Secondary | ICD-10-CM | POA: Diagnosis not present

## 2017-04-05 ENCOUNTER — Other Ambulatory Visit: Payer: Self-pay

## 2017-04-05 MED ORDER — NITROGLYCERIN 0.4 MG SL SUBL: 0.4000 mg | SUBLINGUAL_TABLET | SUBLINGUAL | 1 refills | Status: DC | PRN

## 2017-04-25 ENCOUNTER — Other Ambulatory Visit: Payer: Self-pay

## 2017-04-25 NOTE — Patient Outreach (Signed)
[facility]) Care Management  04/25/2017  KARI MONTERO Apr 21, 1946 [id number]   Medication adherence call to Mrs. Maty Mas the reason for this call is because Mrs. Broxterman is showing past due under [facility] Ins.on her atorvastatin 80 mg was not able to get in touch with patient but call [facility]  they said patient is taking this medication every other day instead of one tablet a daily.   [facility] Management Direct Dial [phone]  Fax [phone] [email or web address]

## 2017-04-27 ENCOUNTER — Other Ambulatory Visit: Payer: Self-pay | Admitting: Physician Assistant

## 2017-04-27 ENCOUNTER — Other Ambulatory Visit: Payer: Self-pay | Admitting: Cardiology

## 2017-04-30 DIAGNOSIS — M7632 Iliotibial band syndrome, left leg: Secondary | ICD-10-CM | POA: Diagnosis not present

## 2017-04-30 DIAGNOSIS — M7062 Trochanteric bursitis, left hip: Secondary | ICD-10-CM | POA: Diagnosis not present

## 2017-04-30 DIAGNOSIS — Z96652 Presence of left artificial knee joint: Secondary | ICD-10-CM | POA: Diagnosis not present

## 2017-05-08 ENCOUNTER — Ambulatory Visit: Payer: Medicare Other | Admitting: Physician Assistant

## 2017-05-10 ENCOUNTER — Ambulatory Visit (INDEPENDENT_AMBULATORY_CARE_PROVIDER_SITE_OTHER): Payer: Medicare Other | Admitting: Physician Assistant

## 2017-05-10 ENCOUNTER — Encounter: Payer: Self-pay | Admitting: Physician Assistant

## 2017-05-10 VITALS — BP 112/68 | HR 76 | Temp 97.3°F | Ht 64.5 in | Wt 252.0 lb

## 2017-05-10 DIAGNOSIS — M1990 Unspecified osteoarthritis, unspecified site: Secondary | ICD-10-CM | POA: Diagnosis not present

## 2017-05-10 DIAGNOSIS — J189 Pneumonia, unspecified organism: Secondary | ICD-10-CM

## 2017-05-10 DIAGNOSIS — G8929 Other chronic pain: Secondary | ICD-10-CM | POA: Diagnosis not present

## 2017-05-10 DIAGNOSIS — M1611 Unilateral primary osteoarthritis, right hip: Secondary | ICD-10-CM

## 2017-05-10 DIAGNOSIS — M5442 Lumbago with sciatica, left side: Secondary | ICD-10-CM

## 2017-05-10 MED ORDER — AZITHROMYCIN 250 MG PO TABS: ORAL_TABLET | ORAL | 0 refills | Status: DC

## 2017-05-10 NOTE — Progress Notes (Signed)
BP 112/68 (BP Location: Left Arm)   Pulse 76   Temp (!) 97.3 F (36.3 C) (Oral)   Ht 5' 4.5" (1.638 m)   Wt 252 lb (114.3 kg)   BMI 42.59 kg/m    Subjective:    Patient ID: Sara [name], female    DOB: Oct 09, 1945, 71 y.o.   MRN: [id number]  HPI: Sara [name] is a 71 y.o. female presenting on 05/10/2017 for Cough (x 2 weeks - much better ) and Back Pain  Patient with several days of progressing upper respiratory and bronchial symptoms. Initially there was more upper respiratory congestion. This progressed to having significant cough that is productive throughout the day and severe at night. There is occasional wheezing after coughing. Sometimes there is slight dyspnea on exertion. It is productive mucus that is yellow in color. Denies any blood.  She also comes in having chronic back pain that radiates to the left side. She has known arthritis of many joints. She has had a hip replacement and knee replacement. She sees [facility]. She plans to have an appointment with them.  Relevant past medical, surgical, family and social history reviewed and updated as indicated. Allergies and medications reviewed and updated.  Past Medical History:  Diagnosis Date  . Arthritis    Arthritis -hands -Bil. knee replacemnts  . AVNRT (AV nodal re-entry tachycardia) ([place])    s/p RFCA 09/2011  . Bronchitis    none recent  . CAD (coronary artery disease)    a. s/p Xience DES x 3 to LAD;  b. nuc study 02/27/10: EF 67% no ischemia;  c.  echo 11/12: Mild LVH, EF 40-98%, grade 1 diastolic dysfunction, moderate LAE;  d.  LHC 07/03/11: LAD stents patent, RCA 25%, EF 55-65% ;  e. [place]. Myoview 5/14:  No ischemia, EF 68%  . Diverticular disease   . GERD (gastroesophageal reflux disease)   . Hemorrhoid   . Hepatitis    yellow jaundice as a child   . HTN (hypertension)   . Hx of echocardiogram    a.  Echo (05/2011):  Mild LVH, EF 55-60%, Gr 1 DD, mod LAE.b. Echo (11/14):  Mild LVH, mild focal  basal septal hypertrophy, EF 55-60%, Gr 1 DD, mild LAE, atrial septal aneurysm  . Hyperlipidemia   . Hypertension   . Impaired hearing    bilateral- no hearing aids  . Jaundice   . Myocardial infarction (HCC)    hx of x 2   . Obesity   . OSA (obstructive sleep apnea)    needs no cpap per patient   . Other psoriasis    ongoing- none at present.  . Vertigo     Past Surgical History:  Procedure Laterality Date  . ABDOMINAL HYSTERECTOMY    . BACK SURGERY     Dr. Lawernce Pitts  . CARDIAC CATHETERIZATION  08/23/2011   Ablation AV  Node  . COLONOSCOPY    . CORONARY ANGIOPLASTY     with stents 2009   . KNEE SURGERY     right  . LEFT AND RIGHT HEART CATHETERIZATION WITH CORONARY ANGIOGRAM N/A 08/18/2013   Procedure: LEFT AND RIGHT HEART CATHETERIZATION WITH CORONARY ANGIOGRAM;  Surgeon: Larey Dresser, MD;  Location[facility] CATH LAB;  Service: Cardiovascular;  Laterality: N/A;  . LEFT HEART CATHETERIZATION WITH CORONARY ANGIOGRAM N/A 07/03/2011   Procedure: LEFT HEART CATHETERIZATION WITH CORONARY ANGIOGRAM;  Surgeon: Minus Breeding, MD;  Location: [facility] CATH LAB;  Service: Cardiovascular;  Laterality: N/A;  .  REPAIR RECTOCELE    . SUPRAVENTRICULAR TACHYCARDIA ABLATION N/A 08/23/2011   Procedure: SUPRAVENTRICULAR TACHYCARDIA ABLATION;  Surgeon: Evans Lance, MD;  Location: [facility] CATH LAB;  Service: Cardiovascular;  Laterality: N/A;  . TOTAL HIP ARTHROPLASTY Right 02/23/2016   Procedure: RIGHT TOTAL HIP ARTHROPLASTY ANTERIOR APPROACH;  Surgeon: Rod Can, MD;  Location: [facility] ORS;  Service: Orthopedics;  Laterality: Right;  . TOTAL KNEE ARTHROPLASTY Left 12/22/2012   Procedure: LEFT TOTAL KNEE ARTHROPLASTY;  Surgeon: Gearlean Alf, MD;  Location: [facility] ORS;  Service: Orthopedics;  Laterality: Left;  . TUBAL LIGATION    . UPPER GASTROINTESTINAL ENDOSCOPY      Review of Systems  Constitutional: Positive for chills and fatigue. Negative for activity change and appetite change.  HENT: Positive for  congestion, postnasal drip and sore throat.   Eyes: Negative.   Respiratory: Positive for cough and wheezing.   Cardiovascular: Negative.  Negative for chest pain, palpitations and leg swelling.  Gastrointestinal: Negative.   Genitourinary: Negative.   Musculoskeletal: Negative.   Skin: Negative.   Neurological: Positive for headaches.    Allergies as of 05/10/2017      Reactions   Nifedipine Other (See Comments)   Brings blood pressure up really fast PROCARDIA   Tape    bleeding   Codeine Itching   Crestor [rosuvastatin] Other (See Comments)   myalgias      Medication List       Accurate as of 05/10/17  4:04 PM. Always use your most recent med list.          aspirin 81 MG chewable tablet Chew 1 tablet (81 mg total) by mouth 2 (two) times daily.   atorvastatin 80 MG tablet Commonly known as:  LIPITOR TAKE 1 TABLET DAILY   azithromycin 250 MG tablet Commonly known as:  ZITHROMAX Take as directed   diltiazem 120 MG 24 hr capsule Commonly known as:  CARDIZEM CD TAKE (1) CAPSULE DAILY   fluticasone 50 MCG/ACT nasal spray Commonly known as:  FLONASE Place 1 spray into both nostrils 2 (two) times daily as needed for allergies or rhinitis.   furosemide 20 MG tablet Commonly known as:  LASIX TAKE 1 TABLET DAILY   HYDROcodone-acetaminophen 5-325 MG tablet Commonly known as:  NORCO Take 1-2 tablets by mouth every 4 (four) hours as needed for moderate pain.   hyoscyamine 0.375 MG 12 hr tablet Commonly known as:  LEVBID Take 1 tablet (0.375 mg total) by mouth 2 (two) times daily.   losartan 50 MG tablet Commonly known as:  COZAAR TAKE 1 TABLET DAILY   meclizine 25 MG tablet Commonly known as:  ANTIVERT Take 25 mg by mouth 2 (two) times daily as needed for dizziness. Reported on 12/05/2015   nitroGLYCERIN 0.4 MG SL tablet Commonly known as:  NITROSTAT Place 1 tablet (0.4 mg total) under the tongue every 5 (five) minutes as needed for chest pain (x 3 doses).  Reported on 12/05/2015   omeprazole 20 MG capsule Commonly known as:  PRILOSEC TAKE (1) CAPSULE DAILY   potassium chloride 10 MEQ tablet Commonly known as:  K-DUR Take 10 mEq by mouth 2 (two) times daily.   spironolactone 25 MG tablet Commonly known as:  ALDACTONE TAKE (1/2) TABLET TWICE DAILY.   SUPER CALCIUM 600 + D3 PO Take 1 tablet by mouth daily.          Objective:    BP 112/68 (BP Location: Left Arm)   Pulse 76   Temp (!) 97.3  F (36.3 C) (Oral)   Ht 5' 4.5" (1.638 m)   Wt 252 lb (114.3 kg)   BMI 42.59 kg/m   Allergies  Allergen Reactions  . Nifedipine Other (See Comments)    Brings blood pressure up really fast PROCARDIA  . Tape     bleeding  . Codeine Itching  . Crestor [Rosuvastatin] Other (See Comments)    myalgias    Physical Exam  Constitutional: She is oriented to person, place, and time. She appears well-developed and well-nourished.  HENT:  Head: Normocephalic and atraumatic.  Right Ear: There is drainage and tenderness.  Left Ear: There is drainage and tenderness.  Nose: Mucosal edema and rhinorrhea present. Right sinus exhibits maxillary sinus tenderness and frontal sinus tenderness. Left sinus exhibits maxillary sinus tenderness and frontal sinus tenderness.  Mouth/Throat: Oropharyngeal exudate and posterior oropharyngeal erythema present.  Eyes: Pupils are equal, round, and reactive to light. Conjunctivae and EOM are normal.  Neck: Normal range of motion. Neck supple.  Cardiovascular: Normal rate, regular rhythm, normal heart sounds and intact distal pulses.   Pulmonary/Chest: Effort normal. She has wheezes in the right upper field and the left upper field.  Abdominal: Soft. Bowel sounds are normal.  Neurological: She is alert and oriented to person, place, and time. She has normal reflexes.  Skin: Skin is warm and dry. No rash noted.  Psychiatric: She has a normal mood and affect. Her behavior is normal. Judgment and thought content normal.         Assessment & Plan:   1. Primary osteoarthritis of right hip  2. Arthritis  3. Chronic left-sided low back pain with left-sided sciatica  4. Atypical pneumonia - azithromycin (ZITHROMAX) 250 MG tablet; Take as directed  Dispense: 6 tablet; Refill: 0    Current Outpatient Prescriptions:  .  aspirin 81 MG chewable tablet, Chew 1 tablet (81 mg total) by mouth 2 (two) times daily., Disp: 60 tablet, Rfl: 1 .  atorvastatin (LIPITOR) 80 MG tablet, TAKE 1 TABLET DAILY, Disp: 90 tablet, Rfl: 1 .  Calcium Carbonate-Vitamin D (SUPER CALCIUM 600 + D3 PO), Take 1 tablet by mouth daily., Disp: , Rfl:  .  diltiazem (CARDIZEM CD) 120 MG 24 hr capsule, TAKE (1) CAPSULE DAILY, Disp: 90 capsule, Rfl: 3 .  fluticasone (FLONASE) 50 MCG/ACT nasal spray, Place 1 spray into both nostrils 2 (two) times daily as needed for allergies or rhinitis., Disp: 16 g, Rfl: 6 .  furosemide (LASIX) 20 MG tablet, TAKE 1 TABLET DAILY, Disp: 90 tablet, Rfl: 1 .  HYDROcodone-acetaminophen (NORCO) 5-325 MG tablet, Take 1-2 tablets by mouth every 4 (four) hours as needed for moderate pain., Disp: 90 tablet, Rfl: 0 .  hyoscyamine (LEVBID) 0.375 MG 12 hr tablet, Take 1 tablet (0.375 mg total) by mouth 2 (two) times daily., Disp: 60 tablet, Rfl: 2 .  losartan (COZAAR) 50 MG tablet, TAKE 1 TABLET DAILY, Disp: 90 tablet, Rfl: 0 .  meclizine (ANTIVERT) 25 MG tablet, Take 25 mg by mouth 2 (two) times daily as needed for dizziness. Reported on 12/05/2015, Disp: , Rfl:  .  nitroGLYCERIN (NITROSTAT) 0.4 MG SL tablet, Place 1 tablet (0.4 mg total) under the tongue every 5 (five) minutes as needed for chest pain (x 3 doses). Reported on 12/05/2015, Disp: 25 tablet, Rfl: 1 .  omeprazole (PRILOSEC) 20 MG capsule, TAKE (1) CAPSULE DAILY, Disp: 90 capsule, Rfl: 0 .  potassium chloride (K-DUR) 10 MEQ tablet, Take 10 mEq by mouth 2 (two) times daily.,  Disp: , Rfl:  .  spironolactone (ALDACTONE) 25 MG tablet, TAKE (1/2) TABLET TWICE DAILY.,  Disp: 90 tablet, Rfl: 0 .  azithromycin (ZITHROMAX) 250 MG tablet, Take as directed, Disp: 6 tablet, Rfl: 0 Continue all other maintenance medications as listed above.  Follow up plan: Return if symptoms worsen or fail to improve, for AWVisit soon.  .  Educational handout given for [place] PA-C [facility] [address]  [place], [place] [address] [phone]   05/10/2017, 4:04 PM

## 2017-05-10 NOTE — Patient Instructions (Signed)
In a few days you may receive a survey in the mail or online from [employer] regarding your visit with us today. Please take a moment to fill this out. Your feedback is very important to our whole office. It can help us better understand your needs as well as improve your experience and satisfaction. Thank you for taking your time to complete it. We care about you.  Dewane Timson, PA-C  

## 2017-05-15 ENCOUNTER — Ambulatory Visit: Payer: Medicare Other | Attending: Orthopedic Surgery | Admitting: Physical Therapy

## 2017-05-15 ENCOUNTER — Encounter: Payer: Self-pay | Admitting: Physical Therapy

## 2017-05-15 DIAGNOSIS — M545 Low back pain, unspecified: Secondary | ICD-10-CM

## 2017-05-15 DIAGNOSIS — G8929 Other chronic pain: Secondary | ICD-10-CM | POA: Diagnosis not present

## 2017-05-15 DIAGNOSIS — R262 Difficulty in walking, not elsewhere classified: Secondary | ICD-10-CM

## 2017-05-15 DIAGNOSIS — M25562 Pain in left knee: Secondary | ICD-10-CM | POA: Insufficient documentation

## 2017-05-15 NOTE — Therapy (Signed)
[facility] [place], [place], [address] Phone: [phone]   Fax:  [phone]  Physical Therapy Evaluation  Patient Details  Name: Sara [name] MRN: [id number] Date of Birth: March 16, 1946 Referring Provider: Ardeen Jourdain, PA  Encounter Date: 05/15/2017      PT End of Session - 05/15/17 1348    Visit Number 1   Authorization Type [employer], due to pt's high co-pay she will be limiting her visits   Activity Tolerance Patient tolerated treatment well   Behavior During Therapy [facility] for tasks assessed/performed      Past Medical History:  Diagnosis Date  . Arthritis    Arthritis -hands -Bil. knee replacemnts  . AVNRT (AV nodal re-entry tachycardia) ([place])    s/p RFCA 09/2011  . Bronchitis    none recent  . CAD (coronary artery disease)    a. s/p Xience DES x 3 to LAD;  b. nuc study 02/27/10: EF 67% no ischemia;  c.  echo 11/12: Mild LVH, EF 58-52%, grade 1 diastolic dysfunction, moderate LAE;  d.  LHC 07/03/11: LAD stents patent, RCA 25%, EF 55-65% ;  e. [place]. Myoview 5/14:  No ischemia, EF 68%  . Diverticular disease   . GERD (gastroesophageal reflux disease)   . Hemorrhoid   . Hepatitis    yellow jaundice as a child   . HTN (hypertension)   . Hx of echocardiogram    a.  Echo (05/2011):  Mild LVH, EF 55-60%, Gr 1 DD, mod LAE.b. Echo (11/14):  Mild LVH, mild focal basal septal hypertrophy, EF 55-60%, Gr 1 DD, mild LAE, atrial septal aneurysm  . Hyperlipidemia   . Hypertension   . Impaired hearing    bilateral- no hearing aids  . Jaundice   . Myocardial infarction (HCC)    hx of x 2   . Obesity   . OSA (obstructive sleep apnea)    needs no cpap per patient   . Other psoriasis    ongoing- none at present.  . Vertigo     Past Surgical History:  Procedure Laterality Date  . ABDOMINAL HYSTERECTOMY    . BACK SURGERY     Dr. Lawernce Pitts  . CARDIAC CATHETERIZATION  08/23/2011   Ablation AV  Node  . COLONOSCOPY    .  CORONARY ANGIOPLASTY     with stents 2009   . KNEE SURGERY     right  . LEFT AND RIGHT HEART CATHETERIZATION WITH CORONARY ANGIOGRAM N/A 08/18/2013   Procedure: LEFT AND RIGHT HEART CATHETERIZATION WITH CORONARY ANGIOGRAM;  Surgeon: Larey Dresser, MD;  Location: [facility] CATH LAB;  Service: Cardiovascular;  Laterality: N/A;  . LEFT HEART CATHETERIZATION WITH CORONARY ANGIOGRAM N/A 07/03/2011   Procedure: LEFT HEART CATHETERIZATION WITH CORONARY ANGIOGRAM;  Surgeon: Minus Breeding, MD;  Location: [facility] CATH LAB;  Service: Cardiovascular;  Laterality: N/A;  . REPAIR RECTOCELE    . SUPRAVENTRICULAR TACHYCARDIA ABLATION N/A 08/23/2011   Procedure: SUPRAVENTRICULAR TACHYCARDIA ABLATION;  Surgeon: Evans Lance, MD;  Location: [facility] CATH LAB;  Service: Cardiovascular;  Laterality: N/A;  . TOTAL HIP ARTHROPLASTY Right 02/23/2016   Procedure: RIGHT TOTAL HIP ARTHROPLASTY ANTERIOR APPROACH;  Surgeon: Rod Can, MD;  Location: [facility]S;  Service: Orthopedics;  Laterality: Right;  . TOTAL KNEE ARTHROPLASTY Left 12/22/2012   Procedure: LEFT TOTAL KNEE ARTHROPLASTY;  Surgeon: Gearlean Alf, MD;  Location: [facility] ORS;  Service: Orthopedics;  Laterality: Left;  . TUBAL LIGATION    . UPPER GASTROINTESTINAL ENDOSCOPY  There were no vitals filed for this visit.       Subjective Assessment - 05/15/17 1309    Subjective Pt arriving to therapy reporting 8/10 pain in left lateral knee. Pt also reporting left sided low back pain. Pt reporting history of back surgery.    How long can you sit comfortably? unlimited   How long can you stand comfortably? 10 minutes   How long can you walk comfortably? 15 minutes   Diagnostic tests No recent X-rays or MRI's   Currently in Pain? Yes   Pain Score 8    Pain Orientation Left   Pain Descriptors / Indicators Aching   Pain Type Chronic pain   Pain Onset More than a month ago   Aggravating Factors  walking, bending   Pain Relieving Factors resting   Effect of Pain on Daily  Activities household activities            [facility] PT Assessment - 05/15/17 0001      Assessment   Medical Diagnosis left knee pain   Referring Provider Ardeen Jourdain, PA   Hand Dominance Right   Next MD Visit 6 weeks   Prior Therapy yes in the past     Precautions   Precautions None     Restrictions   Weight Bearing Restrictions No     Balance Screen   Has the patient fallen in the past 6 months No   Is the patient reluctant to leave their home because of a fear of falling?  No     Home Environment   Living Environment Private residence   Living Arrangements Spouse/significant other   Available Help at Discharge Family   Type of Lupus to enter   Entrance Stairs-Number of Steps 5   Entrance Stairs-Rails Right   [place] One level     Prior Function   Level of [place] Retired     Associate Professor   Overall Cognitive Status Within Functional Limits for tasks assessed     Observation/Other Assessments   Focus on Therapeutic Outcomes (FOTO)  52% limitation     Posture/Postural Control   Posture/Postural Control Postural limitations   Postural Limitations Rounded Shoulders;Forward head;Increased lumbar lordosis     ROM / Strength   AROM / PROM / Strength AROM;Strength     AROM   AROM Assessment Site Knee   Right/Left Knee Left   Left Knee Extension 2   Left Knee Flexion 120     Strength   Strength Assessment Site Hip;Knee   Right/Left Hip Left   Left Hip Flexion 4-/5   Left Hip Extension 3+/5   Left Hip External Rotation --   Left Hip Internal Rotation --   Left Hip ABduction 4-/5   Left Hip ADduction 4-/5   Right/Left Knee Left   Left Knee Flexion 4-/5   Left Knee Extension 4-/5     Palpation   Palpation comment Pt with tenderness noted inferior to left tibial tuberosity, tenderness also noted in inferior hamstring tendons     Transfers   Transfers Sit to Stand   Sit to Stand 6: Modified  independent (Device/Increase time)     Ambulation/Gait   Gait Pattern Step-through pattern;Decreased step length - right;Decreased step length - left;Decreased stance time - left;Decreased stride length;Antalgic;Wide base of support            Objective measurements completed on examination: See above findings.  PT Education - Jun 12, 2017 1346    Education provided Yes   Education Details HEP, Ionto purpose, methods and instructions to take off patch after 4 hours.   Person(s) Educated Patient   Methods Explanation;Demonstration;Handout   Comprehension Verbalized understanding;Returned demonstration             PT Long Term Goals - 2017-06-12 1424      PT LONG TERM GOAL #1   Title pt will be indpendent in her HEP and progression.   Time 6   Period Weeks   Status New   Target Date 06/28/17     PT LONG TERM GOAL #2   Title pt will improve her FOTO score from 52% limitation to </= 45% limitation   Time 6   Period Weeks   Status New   Target Date 06/28/17     PT LONG TERM GOAL #3   Title Pt will be able to amb >/= 20 minutes with pain </= 3/10.    Time 6   Period Weeks   Status New   Target Date 06/28/17                Plan - 12-Jun-2017 1348    Clinical Impression Statement Pt presenting with 8/10 pain in her left knee, also reporting chronic back pain. Pt with tenderness inferior to tibial tuberosity and peroneals. Pt also with tenderness to inferior hamstring tendons. Pt with weakness noted in bilateral hips and left knee. Pt tolerated heat and E-stim well.  At end of session Ionto stat patch applied.   History and Personal Factors relevant to plan of care: Recent injection in Left Hip, h/o bilateral knee replacements   Clinical Presentation Stable   Clinical Presentation due to: L knee pain, Low back pain, recent hip injection, history of multiple co-morbidities   Clinical Decision Making Moderate   Rehab Potential Good   PT  Frequency 2x / week   PT Duration 6 weeks   PT Treatment/Interventions ADLs/Self Care Home Management;Cryotherapy;Electrical Stimulation;Iontophoresis 4mg /ml Dexamethasone;Moist Heat;Balance training;Therapeutic exercise;Therapeutic activities;Functional mobility training;Stair training;Gait training;Patient/family education;Manual techniques;Passive range of motion;Taping   PT Next Visit Plan build HEP with knee strengthening, Iontopatch, modalities to tolerance   PT Home Exercise Plan hamstring stretch, seated heel slides   Consulted and Agree with Plan of Care Patient      Patient will benefit from skilled therapeutic intervention in order to improve the following deficits and impairments:  Abnormal gait, Pain, Decreased strength, Impaired flexibility, Postural dysfunction, Difficulty walking, Decreased mobility  Visit Diagnosis: Chronic pain of left knee  Difficulty in walking, not elsewhere classified  Chronic left-sided low back pain without sciatica      G-Codes - 06-12-2017 1427    Functional Assessment Tool Used (Outpatient Only) FOTO (52% at evaluation), clinical assessment   Functional Limitation Mobility: Walking and moving around   Mobility: Walking and Moving Around Current Status ([id number]) At least 40 percent but less than 60 percent impaired, limited or restricted   Mobility: Walking and Moving Around Goal Status [phone]) At least 40 percent but less than 60 percent impaired, limited or restricted       Problem List Patient Active Problem List   Diagnosis Date Noted  . Chronic left-sided low back pain with left-sided sciatica 05/10/2017  . Coronary artery disease involving native coronary artery of native heart without angina pectoris 03/20/2017  . LBBB (left bundle branch block) 03/20/2017  . Age related osteoporosis 10/17/2016  . Acute rhinosinusitis 10/17/2016  . Primary osteoarthritis  of right hip 02/23/2016  . Hypokalemia 01/20/2015  . OA (osteoarthritis) of  knee 12/22/2012  . AVNRT (AV nodal re-entry tachycardia) ([place])   . Atrial arrhythmia 07/03/2011  . OBSTRUCTIVE SLEEP APNEA 03/15/2010  . EDEMA 02/28/2009  . Hyperlipidemia 12/09/2008  . Essential hypertension 12/09/2008  . Coronary atherosclerosis 12/09/2008  . HEMORRHOIDS 12/09/2008  . GERD 12/09/2008  . PSORIASIS 12/09/2008  . Arthritis 12/09/2008  . VERTIGO 12/09/2008  . CARDIAC MURMUR 12/09/2008    Sara [name], Sara [name] 05/15/2017, 2:35 PM  [facility] [address] [place], [place], [address] Phone: [phone]   Fax:  [phone]  Name: Sara [name] MRN: [id number] Date of Birth: [birth year]

## 2017-05-21 ENCOUNTER — Ambulatory Visit (INDEPENDENT_AMBULATORY_CARE_PROVIDER_SITE_OTHER): Payer: Medicare Other

## 2017-05-21 ENCOUNTER — Ambulatory Visit (INDEPENDENT_AMBULATORY_CARE_PROVIDER_SITE_OTHER): Payer: Medicare Other | Admitting: *Deleted

## 2017-05-21 VITALS — BP 136/80 | HR 64 | Temp 97.9°F | Ht 64.5 in | Wt 251.8 lb

## 2017-05-21 DIAGNOSIS — M810 Age-related osteoporosis without current pathological fracture: Secondary | ICD-10-CM | POA: Diagnosis not present

## 2017-05-21 DIAGNOSIS — M1611 Unilateral primary osteoarthritis, right hip: Secondary | ICD-10-CM

## 2017-05-21 DIAGNOSIS — Z0000 Encounter for general adult medical examination without abnormal findings: Secondary | ICD-10-CM

## 2017-05-21 NOTE — Progress Notes (Signed)
Subjective:   Sara [name] is a 71 y.o. female who presents for an Initial Medicare Annual Wellness Visit.  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  She currently live at home with her husband and cat.  She enjoys singing at the recreation department and attending church at [facility]. Sara [name] eats 3 healthy meals daily that consist of fruits, vegetables and lean proteins.  She is also a part of the [employer] at the [facility] where she exercised for at least 2 hours, 3 times weekly. Overall Sara [name] feels that her health is the same as it was a year ago.            Objective:    Today's Vitals   05/21/17 0936  BP: 136/80  Pulse: 64  Temp: 97.9 F (36.6 C)  TempSrc: Oral  Weight: 251 lb 12.8 oz (114.2 kg)  Height: 5' 4.5" (1.638 m)   Body mass index is 42.55 kg/m.   Current Medications (verified) Outpatient Encounter Prescriptions as of 05/21/2017  Medication Sig  . aspirin 81 MG chewable tablet Chew 1 tablet (81 mg total) by mouth 2 (two) times daily.  Marland Kitchen atorvastatin (LIPITOR) 80 MG tablet TAKE 1 TABLET DAILY  . Calcium Carbonate-Vitamin D (SUPER CALCIUM 600 + D3 PO) Take 1 tablet by mouth daily.  Marland Kitchen diltiazem (CARDIZEM CD) 120 MG 24 hr capsule TAKE (1) CAPSULE DAILY  . fluticasone (FLONASE) 50 MCG/ACT nasal spray Place 1 spray into both nostrils 2 (two) times daily as needed for allergies or rhinitis.  . furosemide (LASIX) 20 MG tablet TAKE 1 TABLET DAILY  . hyoscyamine (LEVBID) 0.375 MG 12 hr tablet Take 1 tablet (0.375 mg total) by mouth 2 (two) times daily.  Marland Kitchen losartan (COZAAR) 50 MG tablet TAKE 1 TABLET DAILY  . meclizine (ANTIVERT) 25 MG tablet Take 25 mg by mouth 2 (two) times daily as needed for dizziness. Reported on 12/05/2015  . omeprazole (PRILOSEC) 20 MG capsule TAKE (1) CAPSULE DAILY  . potassium chloride (K-DUR) 10 MEQ tablet Take 10 mEq by mouth 2 (two) times daily.  Marland Kitchen spironolactone (ALDACTONE) 25 MG tablet TAKE (1/2) TABLET TWICE DAILY.  .  nitroGLYCERIN (NITROSTAT) 0.4 MG SL tablet Place 1 tablet (0.4 mg total) under the tongue every 5 (five) minutes as needed for chest pain (x 3 doses). Reported on 12/05/2015 (Patient not taking: Reported on 05/21/2017)  . [DISCONTINUED] azithromycin (ZITHROMAX) 250 MG tablet Take as directed  . [DISCONTINUED] HYDROcodone-acetaminophen (NORCO) 5-325 MG tablet Take 1-2 tablets by mouth every 4 (four) hours as needed for moderate pain. (Patient not taking: Reported on 05/21/2017)   No facility-administered encounter medications on file as of 05/21/2017.     Allergies (verified) Nifedipine; Tape; Codeine; and Crestor [rosuvastatin]   History: Past Medical History:  Diagnosis Date  . Arthritis    Arthritis -hands -Bil. knee replacemnts  . AVNRT (AV nodal re-entry tachycardia) ([place])    s/p RFCA 09/2011  . Bronchitis    none recent  . CAD (coronary artery disease)    a. s/p Xience DES x 3 to LAD;  b. nuc study 02/27/10: EF 67% no ischemia;  c.  echo 11/12: Mild LVH, EF 02-63%, grade 1 diastolic dysfunction, moderate LAE;  d.  LHC 07/03/11: LAD stents patent, RCA 25%, EF 55-65% ;  e. Adeno. Myoview 5/14:  No ischemia, EF 68%  . Diverticular disease   . GERD (gastroesophageal reflux disease)   . Hemorrhoid   .  Hepatitis    yellow jaundice as a child   . HTN (hypertension)   . Hx of echocardiogram    a.  Echo (05/2011):  Mild LVH, EF 55-60%, Gr 1 DD, mod LAE.b. Echo (11/14):  Mild LVH, mild focal basal septal hypertrophy, EF 55-60%, Gr 1 DD, mild LAE, atrial septal aneurysm  . Hyperlipidemia   . Hypertension   . Impaired hearing    bilateral- no hearing aids  . Jaundice   . Myocardial infarction (HCC)    hx of x 2   . Obesity   . OSA (obstructive sleep apnea)    needs no cpap per patient   . Other psoriasis    ongoing- none at present.  . Vertigo    Past Surgical History:  Procedure Laterality Date  . ABDOMINAL HYSTERECTOMY    . BACK SURGERY     Dr. Lawernce Pitts  . CARDIAC CATHETERIZATION   08/23/2011   Ablation AV  Node  . COLONOSCOPY    . CORONARY ANGIOPLASTY     with stents 2009   . KNEE SURGERY     right  . LEFT AND RIGHT HEART CATHETERIZATION WITH CORONARY ANGIOGRAM N/A 08/18/2013   Procedure: LEFT AND RIGHT HEART CATHETERIZATION WITH CORONARY ANGIOGRAM;  Surgeon: Larey Dresser, MD;  Location: [facility] CATH LAB;  Service: Cardiovascular;  Laterality: N/A;  . LEFT HEART CATHETERIZATION WITH CORONARY ANGIOGRAM N/A 07/03/2011   Procedure: LEFT HEART CATHETERIZATION WITH CORONARY ANGIOGRAM;  Surgeon: Minus Breeding, MD;  Location: [facility] CATH LAB;  Service: Cardiovascular;  Laterality: N/A;  . REPAIR RECTOCELE    . SUPRAVENTRICULAR TACHYCARDIA ABLATION N/A 08/23/2011   Procedure: SUPRAVENTRICULAR TACHYCARDIA ABLATION;  Surgeon: Evans Lance, MD;  Location: [facility] CATH LAB;  Service: Cardiovascular;  Laterality: N/A;  . TOTAL HIP ARTHROPLASTY Right 02/23/2016   Procedure: RIGHT TOTAL HIP ARTHROPLASTY ANTERIOR APPROACH;  Surgeon: Rod Can, MD;  Location: [facility] ORS;  Service: Orthopedics;  Laterality: Right;  . TOTAL KNEE ARTHROPLASTY Left 12/22/2012   Procedure: LEFT TOTAL KNEE ARTHROPLASTY;  Surgeon: Gearlean Alf, MD;  Location: [facility] ORS;  Service: Orthopedics;  Laterality: Left;  . TUBAL LIGATION    . UPPER GASTROINTESTINAL ENDOSCOPY     Family History  Problem Relation Age of Onset  . Aneurysm Father   . Stroke Mother   . Hypertension Mother   . Heart failure Mother   . Seizures Brother   . Muscular dystrophy Daughter        dx as infant, passed age 72  . Seizures Son   . Colon cancer Neg Hx    Social History   Occupational History  . hairdresser    Social History Main Topics  . Smoking status: Never Smoker  . Smokeless tobacco: Never Used  . Alcohol use No  . Drug use: No  . Sexual activity: Not Currently    Birth control/ protection: Post-menopausal    Tobacco Counseling No Tobacco use  Activities of Daily Living In your present state of health, do you have  any difficulty performing the following activities: 05/21/2017  Hearing? N  Vision? N  Difficulty concentrating or making decisions? N  Walking or climbing stairs? N  Dressing or bathing? N  Doing errands, shopping? N  Some recent data might be hidden  No difficulties with ADLs  Immunizations and Health Maintenance Immunization History  Administered Date(s) Administered  . Influenza Whole 04/22/2009  . Influenza,inj,Quad PF,6+ Mos 04/25/2015  . Influenza-Unspecified 05/18/2014, 07/05/2016  . Pneumococcal Conjugate-13 06/15/2014  .  Pneumococcal Polysaccharide-23 04/22/2008  . Tdap 04/22/2013  . Zoster 08/03/2010  Declines Shingrix at this time   Health Maintenance Due  Topic Date Due  . DEXA SCAN  12/04/2015  Dexa scan done today 05/21/2017  Patient Care Team: Theodoro Clock as PCP - General (Physician Assistant) Bensimhon, Shaune Pascal, MD (Cardiology)  Indicate any recent Medical Services you may have received from other than [facility] providers in the past year (date may be approximate).     Assessment:    Hearing/Vision screen Last exam done 2018   Dietary issues and exercise activities discussed: Current Exercise Habits: Structured exercise class, Type of exercise: treadmill;walking;stretching;strength training/weights, Time (Minutes): > 60, Frequency (Times/Week): 3, Weekly Exercise (Minutes/Week): 0, Intensity: Mild, Exercise limited by: None identified  Goals    . Weight (lb) < 200 lb (90.7 kg)          Eat 5 small Meals daily Eat plenty of fruits and vegetables Eat lean proteins  Drink plenty of Water.       Depression Screen PHQ 2/9 Scores 05/21/2017 05/21/2017 05/10/2017 02/19/2017 10/17/2016 04/09/2016 03/16/2016  PHQ - 2 Score 0 0 0 1 2 0 0  PHQ- 9 Score - - - - 10 - -    Fall Risk Fall Risk  05/21/2017 05/21/2017 05/10/2017 02/19/2017 10/17/2016  Falls in the past year? No No No No No    Cognitive Function: MMSE - Mini Mental State Exam 05/21/2017    Orientation to time 5  Orientation to Place 5  Registration 3  Attention/ Calculation 5  Recall 3  Language- name 2 objects 2  Language- repeat 1  Language- follow 3 step command 3  Language- read & follow direction 1  Write a sentence 1  Copy design 1  Total score 30   No memory loss at this time    Screening Tests Health Maintenance  Topic Date Due  . DEXA SCAN  12/04/2015  . MAMMOGRAM  04/17/2018  . COLONOSCOPY  11/15/2021  . TETANUS/TDAP  04/23/2023  . INFLUENZA VACCINE  Completed  . Hepatitis C Screening  Completed  . PNA vac Low Risk Adult  Completed      Plan:  Dexa scan done today.  Make follow up appointment with Particia Nearing, PA.  Continue to exercise 3 times weekly and eat health meals.   Review advance directive packet.   I have personally reviewed and noted the following in the patient's chart:   . Medical and social history . Use of alcohol, tobacco or illicit drugs  . Current medications and supplements . Functional ability and status . Nutritional status . Physical activity . Advanced directives . List of other physicians . Hospitalizations, surgeries, and ER visits in previous 12 months . Vitals . Screenings to include cognitive, depression, and falls . Referrals and appointments  In addition, I have reviewed and discussed with patient certain preventive protocols, quality metrics, and best practice recommendations. A written personalized care plan for preventive services as well as general preventive health recommendations were provided to patient.     Wardell Heath, LPN   [id number]    I have reviewed and agree with the above AWV documentation.   Terald Sleeper PA-C [facility] [address]  [place], [place] [address] [phone]

## 2017-05-21 NOTE — Patient Instructions (Signed)
Call back to make follow up appointment with Particia Nearing, PA Eat healthy meals daily Continue to exercise Think about advance directive   Sara [name] , Thank you for taking time to come for your Medicare Wellness Visit. I appreciate your ongoing commitment to your health goals. Please review the following plan we discussed and let me know if I can assist you in the future.   These are the goals we discussed: Goals    . Weight (lb) < 200 lb (90.7 kg)          Eat 5 small Meals daily Eat plenty of fruits and vegetables Eat lean proteins  Drink plenty of Water.        This is a list of the screening recommended for you and due dates:  Health Maintenance  Topic Date Due  . DEXA scan (bone density measurement)  12/04/2015  . Mammogram  04/17/2018  . Colon Cancer Screening  11/15/2021  . Tetanus Vaccine  04/23/2023  . Flu Shot  Completed  .  Hepatitis C: One time screening is recommended by Center for Disease Control  (CDC) for  adults born from [birth year] through 1966.   Completed  . Pneumonia vaccines  Completed

## 2017-05-23 ENCOUNTER — Ambulatory Visit: Payer: Medicare Other | Attending: Orthopedic Surgery | Admitting: *Deleted

## 2017-05-23 DIAGNOSIS — M545 Low back pain: Secondary | ICD-10-CM | POA: Insufficient documentation

## 2017-05-23 DIAGNOSIS — M25562 Pain in left knee: Secondary | ICD-10-CM | POA: Diagnosis not present

## 2017-05-23 DIAGNOSIS — R262 Difficulty in walking, not elsewhere classified: Secondary | ICD-10-CM | POA: Insufficient documentation

## 2017-05-23 DIAGNOSIS — G8929 Other chronic pain: Secondary | ICD-10-CM | POA: Diagnosis not present

## 2017-05-23 NOTE — Therapy (Signed)
[facility] [place], [place], [address] Phone: [phone]   Fax:  [phone]  Physical Therapy Treatment  Patient Details  Name: Sara [name] MRN: [id number] Date of Birth: [birth year] Referring Provider: Ardeen Jourdain, PA  Encounter Date: 05/23/2017      PT End of Session - 05/23/17 1350    Visit Number 2   Authorization Type UHC, due to pt's high co-pay she will be limiting her visits   PT Start Time 1345   PT Stop Time 1435   PT Time Calculation (min) 50 min   Activity Tolerance Patient tolerated treatment well      Past Medical History:  Diagnosis Date  . Arthritis    Arthritis -hands -Bil. knee replacemnts  . AVNRT (AV nodal re-entry tachycardia) ([place])    s/p RFCA 09/2011  . Bronchitis    none recent  . CAD (coronary artery disease)    a. s/p Xience DES x 3 to LAD;  b. nuc study 02/27/10: EF 67% no ischemia;  c.  echo 11/12: Mild LVH, EF 16-07%, grade 1 diastolic dysfunction, moderate LAE;  d.  LHC 07/03/11: LAD stents patent, RCA 25%, EF 55-65% ;  e. [place]eno. Myoview 5/14:  No ischemia, EF 68%  . Diverticular disease   . GERD (gastroesophageal reflux disease)   . Hemorrhoid   . Hepatitis    yellow jaundice as a child   . HTN (hypertension)   . Hx of echocardiogram    a.  Echo (05/2011):  Mild LVH, EF 55-60%, Gr 1 DD, mod LAE.b. Echo (11/14):  Mild LVH, mild focal basal septal hypertrophy, EF 55-60%, Gr 1 DD, mild LAE, atrial septal aneurysm  . Hyperlipidemia   . Hypertension   . Impaired hearing    bilateral- no hearing aids  . Jaundice   . Myocardial infarction (HCC)    hx of x 2   . Obesity   . OSA (obstructive sleep apnea)    needs no cpap per patient   . Other psoriasis    ongoing- none at present.  . Vertigo     Past Surgical History:  Procedure Laterality Date  . ABDOMINAL HYSTERECTOMY    . BACK SURGERY     Dr. Lawernce Pitts  . CARDIAC CATHETERIZATION  08/23/2011   Ablation AV  Node  .  COLONOSCOPY    . CORONARY ANGIOPLASTY     with stents 2009   . KNEE SURGERY     right  . LEFT AND RIGHT HEART CATHETERIZATION WITH CORONARY ANGIOGRAM N/A 08/18/2013   Procedure: LEFT AND RIGHT HEART CATHETERIZATION WITH CORONARY ANGIOGRAM;  Surgeon: Larey Dresser, MD;  Location: [facility] CATH LAB;  Service: Cardiovascular;  Laterality: N/A;  . LEFT HEART CATHETERIZATION WITH CORONARY ANGIOGRAM N/A 07/03/2011   Procedure: LEFT HEART CATHETERIZATION WITH CORONARY ANGIOGRAM;  Surgeon: Minus Breeding, MD;  Location: [facility] CATH LAB;  Service: Cardiovascular;  Laterality: N/A;  . REPAIR RECTOCELE    . SUPRAVENTRICULAR TACHYCARDIA ABLATION N/A 08/23/2011   Procedure: SUPRAVENTRICULAR TACHYCARDIA ABLATION;  Surgeon: Evans Lance, MD;  Location: [facility] CATH LAB;  Service: Cardiovascular;  Laterality: N/A;  . TOTAL HIP ARTHROPLASTY Right 02/23/2016   Procedure: RIGHT TOTAL HIP ARTHROPLASTY ANTERIOR APPROACH;  Surgeon: Rod Can, MD;  Location: [facility] ORS;  Service: Orthopedics;  Laterality: Right;  . TOTAL KNEE ARTHROPLASTY Left 12/22/2012   Procedure: LEFT TOTAL KNEE ARTHROPLASTY;  Surgeon: Gearlean Alf, MD;  Location: [facility] ORS;  Service: Orthopedics;  Laterality: Left;  .  TUBAL LIGATION    . UPPER GASTROINTESTINAL ENDOSCOPY      There were no vitals filed for this visit.      Subjective Assessment - 05/23/17 1349    Subjective Pt arriving to therapy reporting 8/10 pain in left lateral knee. Pt also reporting left sided low back pain. Pt reporting history of back surgery.    How long can you sit comfortably? unlimited   How long can you stand comfortably? 10 minutes   How long can you walk comfortably? 15 minutes   Diagnostic tests No recent X-rays or MRI's   Currently in Pain? Yes   Pain Score 8    Pain Orientation Left   Pain Descriptors / Indicators Aching   Pain Type Chronic pain   Pain Onset More than a month ago                         [facility] Adult PT Treatment/Exercise -  05/23/17 0001      Exercises   Exercises Knee/Hip     Knee/Hip Exercises: Aerobic   Nustep L5 x 15 mins seat 10     Knee/Hip Exercises: Standing   Rocker Board 3 minutes     Knee/Hip Exercises: Seated   Long Arc Quad Strengthening;Right;3 sets;10 reps  with ball squeeze   Long Arc Quad Weight 2 lbs.   Clamshell with TheraBand Red  3x10     Modalities   Modalities Vasopneumatic;Electrical Stimulation     Electrical Stimulation   Electrical Stimulation Location LT knee lateral aspect IFC x 15 mins    Electrical Stimulation Goals Pain     Vasopneumatic   Number Minutes Vasopneumatic  15 minutes   Vasopnuematic Location  Knee   Vasopneumatic Pressure Low   Vasopneumatic Temperature  36     Manual Therapy   Manual Therapy Soft tissue mobilization   Soft tissue mobilization STW/TM x 10 mins to LT ITB/lateral aspect                     PT Long Term Goals - 05/15/17 1424      PT LONG TERM GOAL #1   Title pt will be indpendent in her HEP and progression.   Time 6   Period Weeks   Status New   Target Date 06/28/17     PT LONG TERM GOAL #2   Title pt will improve her FOTO score from 52% limitation to </= 45% limitation   Time 6   Period Weeks   Status New   Target Date 06/28/17     PT LONG TERM GOAL #3   Title Pt will be able to amb >/= 20 minutes with pain </= 3/10.    Time 6   Period Weeks   Status New   Target Date 06/28/17               Plan - 05/23/17 1350    Clinical Impression Statement Pt arrived today with LT LB, hip, and lateral  knee pain. She did fairly well with Therex for strengthening with minimal pain increase. She was tender along distal ITB on LT side during STW/IASTM. Ionto patch 2/6 was applied to this same area.   Clinical Presentation Stable   Clinical Decision Making Moderate   PT Frequency 2x / week   PT Duration 6 weeks   PT Treatment/Interventions ADLs/Self Care Home Management;Cryotherapy;Electrical  Stimulation;Iontophoresis 4mg /ml Dexamethasone;Moist Heat;Balance training;Therapeutic exercise;Therapeutic activities;Functional mobility training;Stair training;Gait training;Patient/family  education;Manual techniques;Passive range of motion;Taping   PT Next Visit Plan build HEP with knee strengthening, Iontopatch, modalities to tolerance   PT Home Exercise Plan hamstring stretch, seated heel slides   Consulted and Agree with Plan of Care Patient      Patient will benefit from skilled therapeutic intervention in order to improve the following deficits and impairments:  Abnormal gait, Pain, Decreased strength, Impaired flexibility, Postural dysfunction, Difficulty walking, Decreased mobility  Visit Diagnosis: Chronic pain of left knee  Difficulty in walking, not elsewhere classified  Chronic left-sided low back pain without sciatica     Problem List Patient Active Problem List   Diagnosis Date Noted  . Chronic left-sided low back pain with left-sided sciatica 05/10/2017  . Coronary artery disease involving native coronary artery of native heart without angina pectoris 03/20/2017  . LBBB (left bundle branch block) 03/20/2017  . Age related osteoporosis 10/17/2016  . Acute rhinosinusitis 10/17/2016  . Primary osteoarthritis of right hip 02/23/2016  . Hypokalemia 01/20/2015  . OA (osteoarthritis) of knee 12/22/2012  . AVNRT (AV nodal re-entry tachycardia) ([place])   . Atrial arrhythmia 07/03/2011  . OBSTRUCTIVE SLEEP APNEA 03/15/2010  . EDEMA 02/28/2009  . Hyperlipidemia 12/09/2008  . Essential hypertension 12/09/2008  . Coronary atherosclerosis 12/09/2008  . HEMORRHOIDS 12/09/2008  . GERD 12/09/2008  . PSORIASIS 12/09/2008  . Arthritis 12/09/2008  . VERTIGO 12/09/2008  . CARDIAC MURMUR 12/09/2008    RAMSEUR,CHRIS, PTA 05/23/2017, 3:49 PM  [facility] [place], [place], [address] Phone: [phone]   Fax:   [phone]  Name: ABRIANA SALTOS MRN: [id number] Date of Birth: November 07, 1945

## 2017-05-27 ENCOUNTER — Other Ambulatory Visit: Payer: Self-pay | Admitting: Physician Assistant

## 2017-05-27 DIAGNOSIS — Z1231 Encounter for screening mammogram for malignant neoplasm of breast: Secondary | ICD-10-CM

## 2017-05-28 ENCOUNTER — Ambulatory Visit: Payer: Medicare Other | Admitting: *Deleted

## 2017-05-28 DIAGNOSIS — M25562 Pain in left knee: Secondary | ICD-10-CM | POA: Diagnosis not present

## 2017-05-28 DIAGNOSIS — G8929 Other chronic pain: Secondary | ICD-10-CM | POA: Diagnosis not present

## 2017-05-28 DIAGNOSIS — M545 Low back pain, unspecified: Secondary | ICD-10-CM

## 2017-05-28 DIAGNOSIS — R262 Difficulty in walking, not elsewhere classified: Secondary | ICD-10-CM

## 2017-05-28 NOTE — Therapy (Deleted)
[facility] [place], [place], [address] Phone: [phone]   Fax:  [phone]  Physical Therapy Treatment  Patient Details  Name: Sara [name] MRN: [id number] Date of Birth: 22-Oct-1945 Referring Provider: Ardeen Jourdain, PA   Encounter Date: 05/28/2017  PT End of Session - 05/28/17 1307    Visit Number  3    Authorization Type  [employer], due to pt's high co-pay she will be limiting her visits    PT Start Time  1300    PT Stop Time  1400    PT Time Calculation (min)  60 min       Past Medical History:  Diagnosis Date  . Arthritis    Arthritis -hands -Bil. knee replacemnts  . AVNRT (AV nodal re-entry tachycardia) ([place])    s/p RFCA 09/2011  . Bronchitis    none recent  . CAD (coronary artery disease)    a. s/p Xience DES x 3 to LAD;  b. nuc study 02/27/10: EF 67% no ischemia;  c.  echo 11/12: Mild LVH, EF 60-63%, grade 1 diastolic dysfunction, moderate LAE;  d.  LHC 07/03/11: LAD stents patent, RCA 25%, EF 55-65% ;  e. Adeno. Myoview 5/14:  No ischemia, EF 68%  . Diverticular disease   . GERD (gastroesophageal reflux disease)   . Hemorrhoid   . Hepatitis    yellow jaundice as a child   . HTN (hypertension)   . Hx of echocardiogram    a.  Echo (05/2011):  Mild LVH, EF 55-60%, Gr 1 DD, mod LAE.b. Echo (11/14):  Mild LVH, mild focal basal septal hypertrophy, EF 55-60%, Gr 1 DD, mild LAE, atrial septal aneurysm  . Hyperlipidemia   . Hypertension   . Impaired hearing    bilateral- no hearing aids  . Jaundice   . Myocardial infarction (HCC)    hx of x 2   . Obesity   . OSA (obstructive sleep apnea)    needs no cpap per patient   . Other psoriasis    ongoing- none at present.  . Vertigo     Past Surgical History:  Procedure Laterality Date  . ABDOMINAL HYSTERECTOMY    . BACK SURGERY     Dr. Lawernce Pitts  . CARDIAC CATHETERIZATION  08/23/2011   Ablation AV  Node  . COLONOSCOPY    . CORONARY ANGIOPLASTY     with stents  2009   . KNEE SURGERY     right  . REPAIR RECTOCELE    . TUBAL LIGATION    . UPPER GASTROINTESTINAL ENDOSCOPY      There were no vitals filed for this visit.  Subjective Assessment - 05/28/17 1305    Subjective  RT knee 6-8/10. doing about the same    How long can you sit comfortably?  unlimited    How long can you stand comfortably?  10 minutes    How long can you walk comfortably?  15 minutes    Diagnostic tests  No recent X-rays or MRI's    Currently in Pain?  Yes    Pain Score  6     Pain Location  Knee    Pain Orientation  Left    Pain Descriptors / Indicators  Aching    Pain Type  Chronic pain                      [facility] Adult PT Treatment/Exercise - 05/28/17 0001      Exercises  Exercises  Knee/Hip      Knee/Hip Exercises: Aerobic   Nustep  L4 x 15 mins seat 10      Knee/Hip Exercises: Standing   Rocker Board  --      Knee/Hip Exercises: Seated   Long Arc Quad  Strengthening;Right;3 sets;10 reps with ball at knees   with ball at knees   [employer] Weight  3 lbs.    Clamshell with TheraBand  Green 3x10   3x10     Modalities   Modalities  Vasopneumatic;Electrical Stimulation      Electrical Stimulation   Electrical Stimulation Location  LT knee lateral aspect IFC x 15 mins     Electrical Stimulation Goals  Pain      Vasopneumatic   Number Minutes Vasopneumatic   15 minutes    Vasopnuematic Location   Knee    Vasopneumatic Pressure  Low    Vasopneumatic Temperature   36      Manual Therapy   Manual Therapy  Soft tissue mobilization    Soft tissue mobilization  STW/TM x 10 mins to LT ITB/lateral aspect. patella mobs                  PT Long Term Goals - 05/15/17 1424      PT LONG TERM GOAL #1   Title  pt will be indpendent in her HEP and progression.    Time  6    Period  Weeks    Status  New    Target Date  06/28/17      PT LONG TERM GOAL #2   Title  pt will improve her FOTO score from 52% limitation to </= 45%  limitation    Time  6    Period  Weeks    Status  New    Target Date  06/28/17      PT LONG TERM GOAL #3   Title  Pt will be able to amb >/= 20 minutes with pain </= 3/10.     Time  6    Period  Weeks    Status  New    Target Date  06/28/17            Plan - 05/28/17 1403    Clinical Impression Statement  Pt arrived today doing about the same with LT knee pain. She was able to perform all therex for LT knee, but had notable soreness during STW LT ITB. Decreased pain after Rx 3-4/10    Rehab Potential  Good    PT Frequency  2x / week    PT Treatment/Interventions  ADLs/Self Care Home Management;Cryotherapy;Electrical Stimulation;Iontophoresis 4mg /ml Dexamethasone;Moist Heat;Balance training;Therapeutic exercise;Therapeutic activities;Functional mobility training;Stair training;Gait training;Patient/family education;Manual techniques;Passive range of motion;Taping    PT Next Visit Plan  build HEP with knee strengthening, Iontopatch, modalities to tolerance    PT Home Exercise Plan  hamstring stretch, seated heel slides    Consulted and Agree with Plan of Care  Patient       Patient will benefit from skilled therapeutic intervention in order to improve the following deficits and impairments:  Abnormal gait, Pain, Decreased strength, Impaired flexibility, Postural dysfunction, Difficulty walking, Decreased mobility  Visit Diagnosis: Chronic pain of left knee  Difficulty in walking, not elsewhere classified  Chronic left-sided low back pain without sciatica     Problem List Patient Active Problem List   Diagnosis Date Noted  . Chronic left-sided low back pain with left-sided sciatica 05/10/2017  . Coronary  artery disease involving native coronary artery of native heart without angina pectoris 03/20/2017  . LBBB (left bundle branch block) 03/20/2017  . Age related osteoporosis 10/17/2016  . Acute rhinosinusitis 10/17/2016  . Primary osteoarthritis of right hip 02/23/2016   . Hypokalemia 01/20/2015  . OA (osteoarthritis) of knee 12/22/2012  . AVNRT (AV nodal re-entry tachycardia) ([place])   . Atrial arrhythmia 07/03/2011  . OBSTRUCTIVE SLEEP APNEA 03/15/2010  . EDEMA 02/28/2009  . Hyperlipidemia 12/09/2008  . Essential hypertension 12/09/2008  . Coronary atherosclerosis 12/09/2008  . HEMORRHOIDS 12/09/2008  . GERD 12/09/2008  . PSORIASIS 12/09/2008  . Arthritis 12/09/2008  . VERTIGO 12/09/2008  . CARDIAC MURMUR 12/09/2008    RAMSEUR,CHRIS 05/28/2017, 2:14 PM  [facility] [place], [place], [address] Phone: [phone]   Fax:  [phone]  Name: CORTINA VULTAGGIO MRN: [id number] Date of Birth: [birth year]

## 2017-05-28 NOTE — Therapy (Signed)
[facility] [place], [place], [address] Phone: [phone]   Fax:  [phone]  Physical Therapy Treatment  Patient Details  Name: Sara [name] MRN: [id number] Date of Birth: Mar 31, 1946 Referring Provider: Ardeen Jourdain, PA   Encounter Date: 05/28/2017  PT End of Session - 05/28/17 1307    Visit Number  3    Authorization Type  [employer], due to pt's high co-pay she will be limiting her visits    PT Start Time  1300    PT Stop Time  1400    PT Time Calculation (min)  60 min       Past Medical History:  Diagnosis Date  . Arthritis    Arthritis -hands -Bil. knee replacemnts  . AVNRT (AV nodal re-entry tachycardia) ([place])    s/p RFCA 09/2011  . Bronchitis    none recent  . CAD (coronary artery disease)    a. s/p Xience DES x 3 to LAD;  b. nuc study 02/27/10: EF 67% no ischemia;  c.  echo 11/12: Mild LVH, EF 36-64%, grade 1 diastolic dysfunction, moderate LAE;  d.  LHC 07/03/11: LAD stents patent, RCA 25%, EF 55-65% ;  e. Adeno. Myoview 5/14:  No ischemia, EF 68%  . Diverticular disease   . GERD (gastroesophageal reflux disease)   . Hemorrhoid   . Hepatitis    yellow jaundice as a child   . HTN (hypertension)   . Hx of echocardiogram    a.  Echo (05/2011):  Mild LVH, EF 55-60%, Gr 1 DD, mod LAE.b. Echo (11/14):  Mild LVH, mild focal basal septal hypertrophy, EF 55-60%, Gr 1 DD, mild LAE, atrial septal aneurysm  . Hyperlipidemia   . Hypertension   . Impaired hearing    bilateral- no hearing aids  . Jaundice   . Myocardial infarction (HCC)    hx of x 2   . Obesity   . OSA (obstructive sleep apnea)    needs no cpap per patient   . Other psoriasis    ongoing- none at present.  . Vertigo     Past Surgical History:  Procedure Laterality Date  . ABDOMINAL HYSTERECTOMY    . BACK SURGERY     Dr. Lawernce Pitts  . CARDIAC CATHETERIZATION  08/23/2011   Ablation AV  Node  . COLONOSCOPY    . CORONARY ANGIOPLASTY     with stents  2009   . KNEE SURGERY     right  . REPAIR RECTOCELE    . TUBAL LIGATION    . UPPER GASTROINTESTINAL ENDOSCOPY      There were no vitals filed for this visit.  Subjective Assessment - 05/28/17 1305    Subjective  RT knee 6-8/10. doing about the same    How long can you sit comfortably?  unlimited    How long can you stand comfortably?  10 minutes    How long can you walk comfortably?  15 minutes    Diagnostic tests  No recent X-rays or MRI's    Currently in Pain?  Yes    Pain Score  6     Pain Location  Knee    Pain Orientation  Left    Pain Descriptors / Indicators  Aching    Pain Type  Chronic pain                      [facility] Adult PT Treatment/Exercise - 05/28/17 0001      Exercises  Exercises  Knee/Hip      Knee/Hip Exercises: Aerobic   Nustep  L4 x 15 mins seat 10      Knee/Hip Exercises: Standing   Rocker Board  --      Knee/Hip Exercises: Seated   Long Arc Quad  Strengthening;Right;3 sets;10 reps with ball at knees   with ball at knees   [employer] Weight  3 lbs.    Clamshell with TheraBand  Green 3x10   3x10     Modalities   Modalities  Vasopneumatic;Electrical Stimulation      Electrical Stimulation   Electrical Stimulation Location  LT knee lateral aspect IFC x 15 mins     Electrical Stimulation Goals  Pain      Vasopneumatic   Number Minutes Vasopneumatic   15 minutes    Vasopnuematic Location   Knee    Vasopneumatic Pressure  Low    Vasopneumatic Temperature   36      Manual Therapy   Manual Therapy  Soft tissue mobilization    Soft tissue mobilization  STW/TM x 10 mins to LT ITB/lateral aspect. patella mobs                  PT Long Term Goals - 05/15/17 1424      PT LONG TERM GOAL #1   Title  pt will be indpendent in her HEP and progression.    Time  6    Period  Weeks    Status  New    Target Date  06/28/17      PT LONG TERM GOAL #2   Title  pt will improve her FOTO score from 52% limitation to </= 45%  limitation    Time  6    Period  Weeks    Status  New    Target Date  06/28/17      PT LONG TERM GOAL #3   Title  Pt will be able to amb >/= 20 minutes with pain </= 3/10.     Time  6    Period  Weeks    Status  New    Target Date  06/28/17            Plan - 05/28/17 1403    Clinical Impression Statement  Pt arrived today doing about the same with LT knee pain. She was able to perform all therex for LT knee, but had notable soreness during STW LT ITB. Decreased pain after Rx 3-4/10    Rehab Potential  Good    PT Frequency  2x / week    PT Treatment/Interventions  ADLs/Self Care Home Management;Cryotherapy;Electrical Stimulation;Iontophoresis 4mg /ml Dexamethasone;Moist Heat;Balance training;Therapeutic exercise;Therapeutic activities;Functional mobility training;Stair training;Gait training;Patient/family education;Manual techniques;Passive range of motion;Taping    PT Next Visit Plan  build HEP with knee strengthening, Iontopatch, modalities to tolerance    PT Home Exercise Plan  hamstring stretch, seated heel slides    Consulted and Agree with Plan of Care  Patient       Patient will benefit from skilled therapeutic intervention in order to improve the following deficits and impairments:  Abnormal gait, Pain, Decreased strength, Impaired flexibility, Postural dysfunction, Difficulty walking, Decreased mobility  Visit Diagnosis: Chronic pain of left knee  Difficulty in walking, not elsewhere classified  Chronic left-sided low back pain without sciatica     Problem List Patient Active Problem List   Diagnosis Date Noted  . Chronic left-sided low back pain with left-sided sciatica 05/10/2017  . Coronary  artery disease involving native coronary artery of native heart without angina pectoris 03/20/2017  . LBBB (left bundle branch block) 03/20/2017  . Age related osteoporosis 10/17/2016  . Acute rhinosinusitis 10/17/2016  . Primary osteoarthritis of right hip 02/23/2016   . Hypokalemia 01/20/2015  . OA (osteoarthritis) of knee 12/22/2012  . AVNRT (AV nodal re-entry tachycardia) ([place])   . Atrial arrhythmia 07/03/2011  . OBSTRUCTIVE SLEEP APNEA 03/15/2010  . EDEMA 02/28/2009  . Hyperlipidemia 12/09/2008  . Essential hypertension 12/09/2008  . Coronary atherosclerosis 12/09/2008  . HEMORRHOIDS 12/09/2008  . GERD 12/09/2008  . PSORIASIS 12/09/2008  . Arthritis 12/09/2008  . VERTIGO 12/09/2008  . CARDIAC MURMUR 12/09/2008    Sandie Swayze,CHRIS 05/28/2017, 2:19 PM  [facility] [address] [place], [place], [address] Phone: [phone]   Fax:  [phone]  Name: KIRSTINE JACQUIN MRN: [id number] Date of Birth: 1945-07-28

## 2017-05-30 ENCOUNTER — Encounter: Payer: Medicare Other | Admitting: *Deleted

## 2017-06-04 ENCOUNTER — Encounter: Payer: Self-pay | Admitting: Physical Therapy

## 2017-06-04 ENCOUNTER — Ambulatory Visit: Payer: Medicare Other | Admitting: Physical Therapy

## 2017-06-04 DIAGNOSIS — M25562 Pain in left knee: Principal | ICD-10-CM

## 2017-06-04 DIAGNOSIS — R262 Difficulty in walking, not elsewhere classified: Secondary | ICD-10-CM | POA: Diagnosis not present

## 2017-06-04 DIAGNOSIS — M545 Low back pain, unspecified: Secondary | ICD-10-CM

## 2017-06-04 DIAGNOSIS — G8929 Other chronic pain: Secondary | ICD-10-CM

## 2017-06-04 NOTE — Therapy (Signed)
[facility] [place], [place], [address] Phone: [phone]   Fax:  [phone]  Physical Therapy Treatment  Patient Details  Name: Sara [name] MRN: [id number] Date of Birth: Jul 12, 1946 Referring Provider: Ardeen Jourdain, PA   Encounter Date: 06/04/2017  PT End of Session - 06/04/17 1312    Visit Number  [place], due to pt's high co-pay she will be limiting her visits    PT Start Time  1303    PT Stop Time  1403    PT Time Calculation (min)  60 min    Activity Tolerance  Patient tolerated treatment well    Behavior During Therapy  [facility] for tasks assessed/performed       Past Medical History:  Diagnosis Date  . Arthritis    Arthritis -hands -Bil. knee replacemnts  . AVNRT (AV nodal re-entry tachycardia) ([place])    s/p RFCA 09/2011  . Bronchitis    none recent  . CAD (coronary artery disease)    a. s/p Xience DES x 3 to LAD;  b. nuc study 02/27/10: EF 67% no ischemia;  c.  echo 11/12: Mild LVH, EF 09-98%, grade 1 diastolic dysfunction, moderate LAE;  d.  LHC 07/03/11: LAD stents patent, RCA 25%, EF 55-65% ;  e. [place]. Myoview 5/14:  No ischemia, EF 68%  . Diverticular disease   . GERD (gastroesophageal reflux disease)   . Hemorrhoid   . Hepatitis    yellow jaundice as a child   . HTN (hypertension)   . Hx of echocardiogram    a.  Echo (05/2011):  Mild LVH, EF 55-60%, Gr 1 DD, mod LAE.b. Echo (11/14):  Mild LVH, mild focal basal septal hypertrophy, EF 55-60%, Gr 1 DD, mild LAE, atrial septal aneurysm  . Hyperlipidemia   . Hypertension   . Impaired hearing    bilateral- no hearing aids  . Jaundice   . Myocardial infarction (HCC)    hx of x 2   . Obesity   . OSA (obstructive sleep apnea)    needs no cpap per patient   . Other psoriasis    ongoing- none at present.  . Vertigo     Past Surgical History:  Procedure Laterality Date  . ABDOMINAL HYSTERECTOMY    . BACK SURGERY     Dr. Lawernce Pitts   . CARDIAC CATHETERIZATION  08/23/2011   Ablation AV  Node  . COLONOSCOPY    . CORONARY ANGIOPLASTY     with stents 2009   . KNEE SURGERY     right  . REPAIR RECTOCELE    . TUBAL LIGATION    . UPPER GASTROINTESTINAL ENDOSCOPY      There were no vitals filed for this visit.  Subjective Assessment - 06/04/17 1306    Subjective  Reports that her lateral knee felt good following previous treatment but lateral knee is beginning to hurt again.     How long can you sit comfortably?  unlimited    How long can you stand comfortably?  10 minutes    How long can you walk comfortably?  15 minutes    Diagnostic tests  No recent X-rays or MRI's    Currently in Pain?  Yes    Pain Score  5     Pain Location  Knee    Pain Orientation  Left;Lateral    Pain Descriptors / Indicators  Sore;Tender    Pain Type  Chronic pain  Pain Onset  More than a month ago         [facility] PT Assessment - 06/04/17 0001      Assessment   Medical Diagnosis  left knee pain    Hand Dominance  Right    Next MD Visit  6 weeks    Prior Therapy  yes in the past      Precautions   Precautions  None      Restrictions   Weight Bearing Restrictions  No                  [facility] Adult PT Treatment/Exercise - 06/04/17 0001      Knee/Hip Exercises: Aerobic   Nustep  L4 x 15 mins seat 10      Modalities   Modalities  Vasopneumatic;Electrical Stimulation;Iontophoresis      Electrical Stimulation   Electrical Stimulation Location  L lateral knee/ ITB    Electrical Stimulation Action  Pre-Mod    Electrical Stimulation Parameters  80-150 hz x15 min    Electrical Stimulation Goals  Pain;Tone      Iontophoresis   Type of Iontophoresis  Dexamethasone    Location  LT knee lateral aspect    Dose  80 MA/min 55ml    Time  8      Vasopneumatic   Number Minutes Vasopneumatic   15 minutes    Vasopnuematic Location   Knee    Vasopneumatic Pressure  Medium    Vasopneumatic Temperature   34      Manual  Therapy   Manual Therapy  Soft tissue mobilization    Soft tissue mobilization  STW/MFR to L ITB/ lateral knee to reduce tone and pain                  PT Long Term Goals - 05/15/17 1424      PT LONG TERM GOAL #1   Title  pt will be indpendent in her HEP and progression.    Time  6    Period  Weeks    Status  New    Target Date  06/28/17      PT LONG TERM GOAL #2   Title  pt will improve her FOTO score from 52% limitation to </= 45% limitation    Time  6    Period  Weeks    Status  New    Target Date  06/28/17      PT LONG TERM GOAL #3   Title  Pt will be able to amb >/= 20 minutes with pain </= 3/10.     Time  6    Period  Weeks    Status  New    Target Date  06/28/17            Plan - 06/04/17 1345    Clinical Impression Statement  Patient tolerated today's treatment fairly well as she reported that lateral L knee pain was returning. Patient ambulating with antalgic gait secondary to pain in L knee and low back. Patient's L lateral knee and ITB palpated with TPs throughout L ITB and patient sensitive intermittantly to TPR. Normal modalities response noted following all modalities. Iontophoresis patch donned to L lateral knee.  (Pended)     Rehab Potential  Good  (Pended)     PT Frequency  2x / week  (Pended)     PT Duration  6 weeks  (Pended)     PT Treatment/Interventions  ADLs/Self Care Home Management;Cryotherapy;Electrical Stimulation;Iontophoresis  4mg /ml Dexamethasone;Moist Heat;Balance training;Therapeutic exercise;Therapeutic activities;Functional mobility training;Stair training;Gait training;Patient/family education;Manual techniques;Passive range of motion;Taping  (Pended)     PT Next Visit Plan  build HEP with knee strengthening, Iontopatch, modalities to tolerance  (Pended)     PT Home Exercise Plan  hamstring stretch, seated heel slides  (Pended)     Consulted and Agree with Plan of Care  Patient  (Pended)        Patient will benefit from  skilled therapeutic intervention in order to improve the following deficits and impairments:  (P) Abnormal gait, Pain, Decreased strength, Impaired flexibility, Postural dysfunction, Difficulty walking, Decreased mobility  Visit Diagnosis: Chronic pain of left knee  Difficulty in walking, not elsewhere classified  Chronic left-sided low back pain without sciatica     Problem List Patient Active Problem List   Diagnosis Date Noted  . Chronic left-sided low back pain with left-sided sciatica 05/10/2017  . Coronary artery disease involving native coronary artery of native heart without angina pectoris 03/20/2017  . LBBB (left bundle branch block) 03/20/2017  . Age related osteoporosis 10/17/2016  . Acute rhinosinusitis 10/17/2016  . Primary osteoarthritis of right hip 02/23/2016  . Hypokalemia 01/20/2015  . OA (osteoarthritis) of knee 12/22/2012  . AVNRT (AV nodal re-entry tachycardia) ([place])   . Atrial arrhythmia 07/03/2011  . OBSTRUCTIVE SLEEP APNEA 03/15/2010  . EDEMA 02/28/2009  . Hyperlipidemia 12/09/2008  . Essential hypertension 12/09/2008  . Coronary atherosclerosis 12/09/2008  . HEMORRHOIDS 12/09/2008  . GERD 12/09/2008  . PSORIASIS 12/09/2008  . Arthritis 12/09/2008  . VERTIGO 12/09/2008  . CARDIAC MURMUR 12/09/2008    Standley Brooking, PTA 06/04/2017, 2:55 PM  [facility] [address] [place], [place], [address] Phone: [phone]   Fax:  [phone]  Name: NERIAH BROTT MRN: [id number] Date of Birth: 10-May-1946

## 2017-06-11 ENCOUNTER — Encounter: Payer: Self-pay | Admitting: Physical Therapy

## 2017-06-11 ENCOUNTER — Ambulatory Visit: Payer: Medicare Other | Admitting: Physical Therapy

## 2017-06-11 DIAGNOSIS — G8929 Other chronic pain: Secondary | ICD-10-CM | POA: Diagnosis not present

## 2017-06-11 DIAGNOSIS — M545 Low back pain: Secondary | ICD-10-CM | POA: Diagnosis not present

## 2017-06-11 DIAGNOSIS — M25562 Pain in left knee: Secondary | ICD-10-CM | POA: Diagnosis not present

## 2017-06-11 DIAGNOSIS — R262 Difficulty in walking, not elsewhere classified: Secondary | ICD-10-CM | POA: Diagnosis not present

## 2017-06-11 NOTE — Therapy (Signed)
[facility] [place], [place], [address] Phone: [phone]   Fax:  [phone]  Physical Therapy Treatment  Patient Details  Name: RESHONDA KOERBER MRN: [id number] Date of Birth: 1945/11/02 Referring Provider: Ardeen Jourdain, PA   Encounter Date: 06/11/2017  PT End of Session - 06/11/17 1306    Visit Number  [place], due to pt's high co-pay she will be limiting her visits    PT Start Time  1301    PT Stop Time  1356    PT Time Calculation (min)  55 min    Activity Tolerance  Patient tolerated treatment well    Behavior During Therapy  [facility] for tasks assessed/performed       Past Medical History:  Diagnosis Date  . Arthritis    Arthritis -hands -Bil. knee replacemnts  . AVNRT (AV nodal re-entry tachycardia) ([place])    s/p RFCA 09/2011  . Bronchitis    none recent  . CAD (coronary artery disease)    a. s/p Xience DES x 3 to LAD;  b. nuc study 02/27/10: EF 67% no ischemia;  c.  echo 11/12: Mild LVH, EF 37-10%, grade 1 diastolic dysfunction, moderate LAE;  d.  LHC 07/03/11: LAD stents patent, RCA 25%, EF 55-65% ;  e. [place]. Myoview 5/14:  No ischemia, EF 68%  . Diverticular disease   . GERD (gastroesophageal reflux disease)   . Hemorrhoid   . Hepatitis    yellow jaundice as a child   . HTN (hypertension)   . Hx of echocardiogram    a.  Echo (05/2011):  Mild LVH, EF 55-60%, Gr 1 DD, mod LAE.b. Echo (11/14):  Mild LVH, mild focal basal septal hypertrophy, EF 55-60%, Gr 1 DD, mild LAE, atrial septal aneurysm  . Hyperlipidemia   . Hypertension   . Impaired hearing    bilateral- no hearing aids  . Jaundice   . Myocardial infarction (HCC)    hx of x 2   . Obesity   . OSA (obstructive sleep apnea)    needs no cpap per patient   . Other psoriasis    ongoing- none at present.  . Vertigo     Past Surgical History:  Procedure Laterality Date  . ABDOMINAL HYSTERECTOMY    . BACK SURGERY     Dr. Lawernce Pitts   . CARDIAC CATHETERIZATION  08/23/2011   Ablation AV  Node  . COLONOSCOPY    . CORONARY ANGIOPLASTY     with stents 2009   . KNEE SURGERY     right  . LEFT AND RIGHT HEART CATHETERIZATION WITH CORONARY ANGIOGRAM N/A 08/18/2013   Procedure: LEFT AND RIGHT HEART CATHETERIZATION WITH CORONARY ANGIOGRAM;  Surgeon: Larey Dresser, MD;  Location: [facility] CATH LAB;  Service: Cardiovascular;  Laterality: N/A;  . LEFT HEART CATHETERIZATION WITH CORONARY ANGIOGRAM N/A 07/03/2011   Procedure: LEFT HEART CATHETERIZATION WITH CORONARY ANGIOGRAM;  Surgeon: Minus Breeding, MD;  Location: [facility] CATH LAB;  Service: Cardiovascular;  Laterality: N/A;  . REPAIR RECTOCELE    . SUPRAVENTRICULAR TACHYCARDIA ABLATION N/A 08/23/2011   Procedure: SUPRAVENTRICULAR TACHYCARDIA ABLATION;  Surgeon: Evans Lance, MD;  Location: [facility] CATH LAB;  Service: Cardiovascular;  Laterality: N/A;  . TOTAL HIP ARTHROPLASTY Right 02/23/2016   Procedure: RIGHT TOTAL HIP ARTHROPLASTY ANTERIOR APPROACH;  Surgeon: Rod Can, MD;  Location: [facility] ORS;  Service: Orthopedics;  Laterality: Right;  . TOTAL KNEE ARTHROPLASTY Left 12/22/2012   Procedure: LEFT TOTAL  KNEE ARTHROPLASTY;  Surgeon: Gearlean Alf, MD;  Location: [facility] ORS;  Service: Orthopedics;  Laterality: Left;  . TUBAL LIGATION    . UPPER GASTROINTESTINAL ENDOSCOPY      There were no vitals filed for this visit.  Subjective Assessment - 06/11/17 1303    Subjective  Reports that her lateral L knee has been hurting.    How long can you sit comfortably?  unlimited    How long can you stand comfortably?  10 minutes    How long can you walk comfortably?  15 minutes    Diagnostic tests  No recent X-rays or MRI's    Currently in Pain?  Other (Comment) No pain assessment provided by patient         [facility] PT Assessment - 06/11/17 0001      Assessment   Medical Diagnosis  left knee pain    Hand Dominance  Right    Next MD Visit  6 weeks    Prior Therapy  yes in the past       Precautions   Precautions  None      Restrictions   Weight Bearing Restrictions  No                  [facility] Adult PT Treatment/Exercise - 06/11/17 0001      Knee/Hip Exercises: Aerobic   Nustep  L4 x 15 mins seat 10      Modalities   Modalities  Electrical Stimulation;Moist Heat      Moist Heat Therapy   Number Minutes Moist Heat  15 Minutes    Moist Heat Location  -- R thigh      Electrical Stimulation   Electrical Stimulation Location  L lateral knee/ ITB    Electrical Stimulation Action  Pre-Mod    Electrical Stimulation Parameters  80-150 hz x15 min    Electrical Stimulation Goals  Pain;Tone      Manual Therapy   Manual Therapy  Soft tissue mobilization    Soft tissue mobilization  STW/MFR to L ITB/ lateral knee to reduce tone and pain in supine and SL                   PT Long Term Goals - 05/15/17 1424      PT LONG TERM GOAL #1   Title  pt will be indpendent in her HEP and progression.    Time  6    Period  Weeks    Status  New    Target Date  06/28/17      PT LONG TERM GOAL #2   Title  pt will improve her FOTO score from 52% limitation to </= 45% limitation    Time  6    Period  Weeks    Status  New    Target Date  06/28/17      PT LONG TERM GOAL #3   Title  Pt will be able to amb >/= 20 minutes with pain </= 3/10.     Time  6    Period  Weeks    Status  New    Target Date  06/28/17            Plan - 06/11/17 1357    Clinical Impression Statement  Patient arrived with continued L lateral knee pain. TPs and sensivity to manual therapy noted along mid L ITB and lateral quad today. Better response to manual therapy in sidelying. Normal modalities response noted following  [place] of the modalities. Iontophoresis patch donned to lateral L knee in patient indicated area.     Rehab Potential  Good    PT Frequency  2x / week    PT Duration  6 weeks    PT Treatment/Interventions  ADLs/Self Care Home Management;Cryotherapy;Electrical  Stimulation;Iontophoresis 4mg /ml Dexamethasone;Moist Heat;Balance training;Therapeutic exercise;Therapeutic activities;Functional mobility training;Stair training;Gait training;Patient/family education;Manual techniques;Passive range of motion;Taping    PT Next Visit Plan  build HEP with knee strengthening, Iontopatch, modalities to tolerance    PT Home Exercise Plan  hamstring stretch, seated heel slides    Consulted and Agree with Plan of Care  Patient       Patient will benefit from skilled therapeutic intervention in order to improve the following deficits and impairments:  Abnormal gait, Pain, Decreased strength, Impaired flexibility, Postural dysfunction, Difficulty walking, Decreased mobility  Visit Diagnosis: Chronic pain of left knee  Difficulty in walking, not elsewhere classified  Chronic left-sided low back pain without sciatica     Problem List Patient Active Problem List   Diagnosis Date Noted  . Chronic left-sided low back pain with left-sided sciatica 05/10/2017  . Coronary artery disease involving native coronary artery of native heart without angina pectoris 03/20/2017  . LBBB (left bundle branch block) 03/20/2017  . Age related osteoporosis 10/17/2016  . Acute rhinosinusitis 10/17/2016  . Primary osteoarthritis of right hip 02/23/2016  . Hypokalemia 01/20/2015  . OA (osteoarthritis) of knee 12/22/2012  . AVNRT (AV nodal re-entry tachycardia) ([place])   . Atrial arrhythmia 07/03/2011  . OBSTRUCTIVE SLEEP APNEA 03/15/2010  . EDEMA 02/28/2009  . Hyperlipidemia 12/09/2008  . Essential hypertension 12/09/2008  . Coronary atherosclerosis 12/09/2008  . HEMORRHOIDS 12/09/2008  . GERD 12/09/2008  . PSORIASIS 12/09/2008  . Arthritis 12/09/2008  . VERTIGO 12/09/2008  . CARDIAC MURMUR 12/09/2008    Standley Brooking, PTA 06/11/2017, 2:32 PM  [facility] [address] [place], [place], [address] Phone: [phone]   Fax:   [phone]  Name: KENYATTE GRUBER MRN: [id number] Date of Birth: 1946/07/22

## 2017-06-17 ENCOUNTER — Ambulatory Visit: Payer: Medicare Other | Admitting: Physical Therapy

## 2017-06-17 ENCOUNTER — Encounter: Payer: Self-pay | Admitting: Physical Therapy

## 2017-06-17 DIAGNOSIS — M25562 Pain in left knee: Principal | ICD-10-CM

## 2017-06-17 DIAGNOSIS — G8929 Other chronic pain: Secondary | ICD-10-CM

## 2017-06-17 DIAGNOSIS — R262 Difficulty in walking, not elsewhere classified: Secondary | ICD-10-CM | POA: Diagnosis not present

## 2017-06-17 DIAGNOSIS — M545 Low back pain, unspecified: Secondary | ICD-10-CM

## 2017-06-17 NOTE — Therapy (Addendum)
[facility] [place], [place], [address] Phone: [phone]   Fax:  [phone]  Physical Therapy Treatment Discharge  Sara [name] Details  Name: Sara [name] MRN: [id number] Date of Birth: [birth year] Referring Provider: Ardeen Jourdain, PA   Encounter Date: 06/17/2017  [name] End of Session - 06/17/17 1430    Visit Number  [place], due to [name]'s high co-pay she will be limiting her visits    [name] Start Time  1431    [name] Stop Time  1530    [name] Time Calculation (min)  59 min    Activity Tolerance  Sara [name] tolerated treatment well    Behavior During Therapy  [facility] for tasks assessed/performed       Past Medical History:  Diagnosis Date  . Arthritis    Arthritis -hands -Bil. knee replacemnts  . AVNRT (AV nodal re-entry tachycardia) ([place])    s/p RFCA 09/2011  . Bronchitis    none recent  . CAD (coronary artery disease)    a. s/p Xience DES x 3 to LAD;  b. nuc study 02/27/10: EF 67% no ischemia;  c.  echo 11/12: Mild LVH, EF 48-18%, grade 1 diastolic dysfunction, moderate LAE;  d.  LHC 07/03/11: LAD stents patent, RCA 25%, EF 55-65% ;  e. [place]. Myoview 5/14:  No ischemia, EF 68%  . Diverticular disease   . GERD (gastroesophageal reflux disease)   . Hemorrhoid   . Hepatitis    yellow jaundice as a child   . HTN (hypertension)   . Hx of echocardiogram    a.  Echo (05/2011):  Mild LVH, EF 55-60%, Gr 1 DD, mod LAE.b. Echo (11/14):  Mild LVH, mild focal basal septal hypertrophy, EF 55-60%, Gr 1 DD, mild LAE, atrial septal aneurysm  . Hyperlipidemia   . Hypertension   . Impaired hearing    bilateral- no hearing aids  . Jaundice   . Myocardial infarction (HCC)    hx of x 2   . Obesity   . OSA (obstructive sleep apnea)    needs no cpap per Sara [name]   . Other psoriasis    ongoing- none at present.  . Vertigo     Past Surgical History:  Procedure Laterality Date  . ABDOMINAL HYSTERECTOMY    . BACK SURGERY      Dr. Lawernce Pitts  . CARDIAC CATHETERIZATION  08/23/2011   Ablation AV  Node  . COLONOSCOPY    . CORONARY ANGIOPLASTY     with stents 2009   . KNEE SURGERY     right  . LEFT AND RIGHT HEART CATHETERIZATION WITH CORONARY ANGIOGRAM N/A 08/18/2013   Procedure: LEFT AND RIGHT HEART CATHETERIZATION WITH CORONARY ANGIOGRAM;  Surgeon: Larey Dresser, MD;  Location: Sara [name];  Service: Cardiovascular;  Laterality: N/A;  . LEFT HEART CATHETERIZATION WITH CORONARY ANGIOGRAM N/A 07/03/2011   Procedure: LEFT HEART CATHETERIZATION WITH CORONARY ANGIOGRAM;  Surgeon: Minus Breeding, MD;  Location: Sara [name];  Service: Cardiovascular;  Laterality: N/A;  . REPAIR RECTOCELE    . SUPRAVENTRICULAR TACHYCARDIA ABLATION N/A 08/23/2011   Procedure: SUPRAVENTRICULAR TACHYCARDIA ABLATION;  Surgeon: Evans Lance, MD;  Location: [facility] CATH [name];  Service: Cardiovascular;  Laterality: N/A;  . TOTAL HIP ARTHROPLASTY Right 02/23/2016   Procedure: RIGHT TOTAL HIP ARTHROPLASTY ANTERIOR APPROACH;  Surgeon: Rod Can, MD;  Location: Sara [name];  Service: Orthopedics;  Laterality: Right;  . TOTAL KNEE ARTHROPLASTY Left 12/22/2012   Procedure: LEFT  TOTAL KNEE ARTHROPLASTY;  Surgeon: Gearlean Alf, MD;  Location: Sara [name];  Service: Orthopedics;  Laterality: Left;  . TUBAL LIGATION    . UPPER GASTROINTESTINAL ENDOSCOPY      There were no vitals filed for this visit.  Subjective Assessment - 06/17/17 1429    Subjective  Wishes to be placed on hold after today as she feels her leg is better. Reports that she didn't have any trouble after riding to [place] last week.    How long can you sit comfortably?  unlimited    How long can you stand comfortably?  10 minutes    How long can you walk comfortably?  15 minutes    Diagnostic tests  No recent X-rays or MRI's    Currently in Pain?  Yes    Pain Score  4     Pain Location  Knee    Pain Orientation  Left;Lateral    Pain Descriptors / Indicators  Discomfort    Pain Type  Chronic pain     Pain Onset  More than a month ago         [name] Assessment - 06/17/17 0001      Assessment   Medical Diagnosis  left knee pain    Hand Dominance  Right    Next MD Visit  6 weeks    Prior Therapy  yes in the past      Precautions   Precautions  None      Restrictions   Weight Bearing Restrictions  No                  [facility] Adult [name] Treatment/Exercise - 06/17/17 0001      Knee/Hip Exercises: Aerobic   Nustep  L5 x15 min      Knee/Hip Exercises: Supine   Bridges  Strengthening;20 reps    Straight Leg Raises  AROM;Left;20 reps    Other Supine Knee/Hip Exercises  B clam green theraband x30 reps      Modalities   Modalities  Electrical Stimulation;Moist Heat      Moist Heat Therapy   Number Minutes Moist Heat  15 Minutes    Moist Heat Location  Other (comment) thigh      Electrical Stimulation   Electrical Stimulation Location  L lateral knee/ ITB    Electrical Stimulation Action  Pre-Mod    Electrical Stimulation Parameters  80-150 hz x15 min    Electrical Stimulation Goals  Pain;Tone      Manual Therapy   Manual Therapy  Soft tissue mobilization    Soft tissue mobilization  STW/MFR to L ITB/ lateral knee to reduce tone and pain in SL                  [name] Long Term Goals - 05/15/17 1424      [name] LONG TERM GOAL #1   Title  [name] will be indpendent in her HEP and progression.    Time  6    Period  Weeks    Status  New    Target Date  06/28/17      [name] LONG TERM GOAL #2   Title  [name] will improve her FOTO score from 52% limitation to </= 45% limitation    Time  6    Period  Weeks    Status  New    Target Date  06/28/17      [name] LONG TERM GOAL #3   Title  [name] will  be able to amb >/= 20 minutes with pain </= 3/10.     Time  6    Period  Weeks    Status  New    Target Date  06/28/17            Plan - 06/17/17 1547    Clinical Impression Statement  Sara [name] arrived to treatment with reports of improvement as she is not experiencing  pain as she did previously. Palpable TPs noted throughout posterior fibers of L ITB with intermittant discomfort reported by Sara [name] with manual therapy. Supine LLE strengthening completed today without reports of increased pain by Sara [name] and no signs of weakness. Iontophoresis skipped today secondary to improvement in Sara [name]'s condition. Normal modalities response noted following removal of the modalities.    Rehab Potential  Good    [name] Frequency  2x / week    [name] Duration  6 weeks    [name] Treatment/Interventions  ADLs/Self Care Home Management;Cryotherapy;Electrical Stimulation;Iontophoresis 40m/ml Dexamethasone;Moist Heat;Balance training;Therapeutic exercise;Therapeutic activities;Functional mobility training;Stair training;Gait training;Sara [name]/family education;Manual techniques;Passive range of motion;Taping    [name] Next Visit Plan  Place on hold at this time due to finances and improvement.    [name] Home Exercise Plan  hamstring stretch, seated heel slides    Consulted and Agree with Plan of Care  Sara [name]       Sara [name] will benefit from skilled therapeutic intervention in order to improve the following deficits and impairments:  Abnormal gait, Pain, Decreased strength, Impaired flexibility, Postural dysfunction, Difficulty walking, Decreased mobility  Visit Diagnosis: Chronic pain of left knee  Difficulty in walking, not elsewhere classified  Chronic left-sided low back pain without sciatica     Problem List Sara [name] Active Problem List   Diagnosis Date Noted  . Chronic left-sided low back pain with left-sided sciatica 05/10/2017  . Coronary artery disease involving native coronary artery of native heart without angina pectoris 03/20/2017  . LBBB (left bundle branch block) 03/20/2017  . Age related osteoporosis 10/17/2016  . Acute rhinosinusitis 10/17/2016  . Primary osteoarthritis of right hip 02/23/2016  . Hypokalemia 01/20/2015  . OA (osteoarthritis) of knee 12/22/2012  . [place]   . Atrial arrhythmia 07/03/2011  . OBSTRUCTIVE SLEEP APNEA 03/15/2010  . EDEMA 02/28/2009  . Hyperlipidemia 12/09/2008  . Essential hypertension 12/09/2008  . Coronary atherosclerosis 12/09/2008  . HEMORRHOIDS 12/09/2008  . GERD 12/09/2008  . PSORIASIS 12/09/2008  . Arthritis 12/09/2008  . VERTIGO 12/09/2008  . CARDIAC MURMUR 12/09/2008    KStandley Brooking PTA 06/17/2017, 3:50 PM Agreeing with PTA's plan to place [name] on hold due to subjectively [name] reporting improvements.  JKearney Hard [name] 07/08/17 8:57 AM  PHYSICAL THERAPY DISCHARGE SUMMARY  Visits from Start of Care: 6  Current functional level related to goals / functional outcomes: See above    Remaining deficits: See above   Education / Equipment: HEP Plan: Sara [name] agrees to discharge.  Sara [name] goals were not met. Sara [name] is being discharged due to being pleased with the current functional level.  ?????       [facility] [address] [address]Phone: [phone]  Fax:  [phone] Name: PYAILEN ZEMAITISMRN: [id number] of Birth: 1Mar 03, 1947 JKearney Hard [name], Sara [name] 01/18/20 12:12 PM

## 2017-06-25 ENCOUNTER — Ambulatory Visit
Admission: RE | Admit: 2017-06-25 | Discharge: 2017-06-25 | Disposition: A | Discharge status: Home / Self Care | Payer: Medicare Other | Source: Ambulatory Visit | Attending: Physician Assistant | Admitting: Physician Assistant

## 2017-06-25 DIAGNOSIS — Z1231 Encounter for screening mammogram for malignant neoplasm of breast: Secondary | ICD-10-CM | POA: Diagnosis not present

## 2017-07-02 ENCOUNTER — Other Ambulatory Visit: Payer: Self-pay | Admitting: Physician Assistant

## 2017-07-02 DIAGNOSIS — I10 Essential (primary) hypertension: Secondary | ICD-10-CM

## 2017-07-02 DIAGNOSIS — R609 Edema, unspecified: Secondary | ICD-10-CM

## 2017-08-02 ENCOUNTER — Other Ambulatory Visit: Payer: Self-pay | Admitting: Physician Assistant

## 2017-08-26 NOTE — Progress Notes (Signed)
Cardiology Office Note   Date:  08/28/2017   ID:  Sara [name], DOB 10-31-1945, MRN [id number]  PCP:  Terald Sleeper, PA-C  Cardiologist:   Minus Breeding, MD  Referring:  Terald Sleeper, PA-C  Chief Complaint  Patient presents with  . Coronary Artery Disease      History of Present Illness: Sara [name] is a 72 y.o. female who presents for follow up of CAD and AVNRT.  She had PCI to the LAD in 10/10.  In 12/12, she was admitted to [facility] with SVT and associated chest pain and elevated troponin.  Cath in 12/12 showed patent LAD stents. She had a right and a left heart cath in 2015 for dyspnea.  She had patent arteries.  She had normal right heart pressures.    She had AVNRT ablation 3/12.  She had Adenosine Cardiolite in 1/18  with no evidence for ischemia or infarction.     She has been under stress because her son is ill.  She is raising her 3 year old granddaughter.  She does go to the [facility] with her husband.  She has some back problems and has to do some chair exercises but she gets around fairly well and does well with this. The patient denies any new symptoms such as chest discomfort, neck or arm discomfort. There has been no new shortness of breath, PND or orthopnea. There have been no reported palpitations, presyncope or syncope.   Past Medical History:  Diagnosis Date  . Arthritis    Arthritis -hands -Bil. knee replacemnts  . AVNRT (AV nodal re-entry tachycardia) ([place])    s/p RFCA 09/2011  . Bronchitis    none recent  . CAD (coronary artery disease)    a. s/p Xience DES x 3 to LAD;  b. nuc study 02/27/10: EF 67% no ischemia;  c.  echo 11/12: Mild LVH, EF 42-59%, grade 1 diastolic dysfunction, moderate LAE;  d.  LHC 07/03/11: LAD stents patent, RCA 25%, EF 55-65% ;  e. Adeno. Myoview 5/14:  No ischemia, EF 68%  . Diverticular disease   . GERD (gastroesophageal reflux disease)   . Hemorrhoid   . Hepatitis    yellow jaundice as a child   . HTN (hypertension)   . Hx  of echocardiogram    a.  Echo (05/2011):  Mild LVH, EF 55-60%, Gr 1 DD, mod LAE.b. Echo (11/14):  Mild LVH, mild focal basal septal hypertrophy, EF 55-60%, Gr 1 DD, mild LAE, atrial septal aneurysm  . Hyperlipidemia   . Hypertension   . Impaired hearing    bilateral- no hearing aids  . Jaundice   . Myocardial infarction (HCC)    hx of x 2   . Obesity   . OSA (obstructive sleep apnea)    needs no cpap per patient   . Other psoriasis    ongoing- none at present.  . Vertigo     Past Surgical History:  Procedure Laterality Date  . ABDOMINAL HYSTERECTOMY    . BACK SURGERY     Dr. Lawernce Pitts  . CARDIAC CATHETERIZATION  08/23/2011   Ablation AV  Node  . COLONOSCOPY    . CORONARY ANGIOPLASTY     with stents 2009   . KNEE SURGERY     right  . LEFT AND RIGHT HEART CATHETERIZATION WITH CORONARY ANGIOGRAM N/A 08/18/2013   Procedure: LEFT AND RIGHT HEART CATHETERIZATION WITH CORONARY ANGIOGRAM;  Surgeon: Larey Dresser, MD;  Location[facility] CATH  LAB;  Service: Cardiovascular;  Laterality: N/A;  . LEFT HEART CATHETERIZATION WITH CORONARY ANGIOGRAM N/A 07/03/2011   Procedure: LEFT HEART CATHETERIZATION WITH CORONARY ANGIOGRAM;  Surgeon: Minus Breeding, MD;  Location: [facility] CATH LAB;  Service: Cardiovascular;  Laterality: N/A;  . REPAIR RECTOCELE    . SUPRAVENTRICULAR TACHYCARDIA ABLATION N/A 08/23/2011   Procedure: SUPRAVENTRICULAR TACHYCARDIA ABLATION;  Surgeon: Evans Lance, MD;  Location: [facility] CATH LAB;  Service: Cardiovascular;  Laterality: N/A;  . TOTAL HIP ARTHROPLASTY Right 02/23/2016   Procedure: RIGHT TOTAL HIP ARTHROPLASTY ANTERIOR APPROACH;  Surgeon: Rod Can, MD;  Location: [facility] ORS;  Service: Orthopedics;  Laterality: Right;  . TOTAL KNEE ARTHROPLASTY Left 12/22/2012   Procedure: LEFT TOTAL KNEE ARTHROPLASTY;  Surgeon: Gearlean Alf, MD;  Location: [facility] ORS;  Service: Orthopedics;  Laterality: Left;  . TUBAL LIGATION    . UPPER GASTROINTESTINAL ENDOSCOPY       Current Outpatient  Medications  Medication Sig Dispense Refill  . aspirin EC 81 MG tablet Take 81 mg by mouth daily.    Marland Kitchen atorvastatin (LIPITOR) 80 MG tablet Take 80 mg by mouth every other day.    . Calcium Carbonate-Vitamin D (SUPER CALCIUM 600 + D3 PO) Take 1 tablet by mouth daily.    Marland Kitchen diltiazem (CARDIZEM CD) 120 MG 24 hr capsule TAKE (1) CAPSULE DAILY 90 capsule 3  . fluticasone (FLONASE) 50 MCG/ACT nasal spray Place 1 spray into both nostrils 2 (two) times daily as needed for allergies or rhinitis. 16 g 6  . furosemide (LASIX) 20 MG tablet TAKE 1 TABLET DAILY 90 tablet 0  . hyoscyamine (LEVBID) 0.375 MG 12 hr tablet Take 1 tablet (0.375 mg total) by mouth 2 (two) times daily. 60 tablet 2  . losartan (COZAAR) 50 MG tablet TAKE 1 TABLET DAILY 90 tablet 0  . meclizine (ANTIVERT) 25 MG tablet Take 25 mg by mouth 2 (two) times daily as needed for dizziness. Reported on 12/05/2015    . nitroGLYCERIN (NITROSTAT) 0.4 MG SL tablet Place 1 tablet (0.4 mg total) under the tongue every 5 (five) minutes as needed for chest pain (x 3 doses). Reported on 12/05/2015 25 tablet 1  . omeprazole (PRILOSEC) 20 MG capsule TAKE (1) CAPSULE DAILY 90 capsule 0  . potassium chloride (K-DUR) 10 MEQ tablet TAKE 1 TABLET DAILY 90 tablet 0  . spironolactone (ALDACTONE) 25 MG tablet TAKE (1/2) TABLET TWICE DAILY. 90 tablet 0   No current facility-administered medications for this visit.     Allergies:   Nifedipine; Tape; Codeine; and Crestor [rosuvastatin]    ROS:  Please see the history of present illness.   Otherwise, review of systems are positive for none.   All other systems are reviewed and negative.    PHYSICAL EXAM: VS:  BP 107/73   Pulse 97   Ht 5\' 4"  (1.626 m)   Wt 254 lb (115.2 kg)   BMI 43.60 kg/m  , BMI Body mass index is 43.6 kg/m.  GENERAL:  Well appearing NECK:  No jugular venous distention, waveform within normal limits, carotid upstroke brisk and symmetric, no bruits, no thyromegaly LUNGS:  Clear to  auscultation bilaterally CHEST:  Unremarkable HEART:  PMI not displaced or sustained,S1 and S2 within normal limits, no S3, no S4, no clicks, no rubs, with no murmurs ABD:  Flat, positive bowel sounds normal in frequency in pitch, no bruits, no rebound, no guarding, no midline pulsatile mass, no hepatomegaly, no splenomegaly EXT:  2 plus pulses throughout, no edema,  no cyanosis no clubbing   EKG:  EKG is not ordered today.   Recent Labs: 10/17/2016: ALT 16; BUN 8; Creatinine, Ser 0.78; Hemoglobin 13.9; Platelets 178; Potassium 4.6; Sodium 143; TSH 2.500    Lipid Panel    Component Value Date/Time   CHOL 158 10/17/2016 0950   TRIG 79 10/17/2016 0950   HDL 52 10/17/2016 0950   CHOLHDL 3.0 10/17/2016 0950   CHOLHDL 3.0 07/11/2016 1507   VLDL 13 07/11/2016 1507   LDLCALC 90 10/17/2016 0950      Wt Readings from Last 3 Encounters:  08/28/17 254 lb (115.2 kg)  05/21/17 251 lb 12.8 oz (114.2 kg)  05/10/17 252 lb (114.3 kg)      Other studies Reviewed: Additional studies/ records that were reviewed today include:    Review of the above records demonstrates:  Please see elsewhere in the note.     ASSESSMENT AND PLAN:    CAD:    The patient has no new sypmtoms.  No further cardiovascular testing is indicated.  We will continue with aggressive risk reduction and meds as listed.  AVNRT:  She no longer feels this. No change in therapy is indicated.    OBESITY:    We have talked about specific diet and exercise plans.   DYSPNEA:    This is chronic and mild and likely related to weight.   LBBB:  This is chronic. No change in therapy.   Current medicines are reviewed at length with the patient today.  The patient does not have concerns regarding medicines.  The following changes have been made:    None  Labs/ tests ordered today include: None  No orders of the defined types were placed in this encounter.    Disposition:   FU with me in 12 months.     Signed, Minus Breeding, MD  08/28/2017 1:57 PM    [facility]

## 2017-08-27 ENCOUNTER — Telehealth: Payer: Self-pay | Admitting: Physician Assistant

## 2017-08-27 NOTE — Telephone Encounter (Signed)
What symptoms do you have? Ear stopped up runny nose eyes are watery and a little cough  How long have you been sick? A week  Have you been seen for this problem? yes  If your provider decides to give you a prescription, which pharmacy [facility] you [facility]   Patient informed that this information will be sent to the clinical staff for review and that they should receive a follow up call.

## 2017-08-27 NOTE — Telephone Encounter (Signed)
Left message for patient to call  To schedule an appointment.

## 2017-08-28 ENCOUNTER — Encounter: Payer: Self-pay | Admitting: Cardiology

## 2017-08-28 ENCOUNTER — Ambulatory Visit: Payer: Medicare Other | Admitting: Cardiology

## 2017-08-28 VITALS — BP 107/73 | HR 97 | Ht 64.0 in | Wt 254.0 lb

## 2017-08-28 DIAGNOSIS — I2510 Atherosclerotic heart disease of native coronary artery without angina pectoris: Secondary | ICD-10-CM

## 2017-08-28 DIAGNOSIS — I447 Left bundle-branch block, unspecified: Secondary | ICD-10-CM

## 2017-08-28 DIAGNOSIS — R0602 Shortness of breath: Secondary | ICD-10-CM

## 2017-08-28 NOTE — Patient Instructions (Signed)

## 2017-09-02 ENCOUNTER — Ambulatory Visit: Payer: Medicare Other | Admitting: Family Medicine

## 2017-09-02 ENCOUNTER — Encounter: Payer: Self-pay | Admitting: Physician Assistant

## 2017-09-02 ENCOUNTER — Ambulatory Visit (INDEPENDENT_AMBULATORY_CARE_PROVIDER_SITE_OTHER): Payer: Medicare HMO | Admitting: Physician Assistant

## 2017-09-02 VITALS — BP 115/73 | HR 84 | Temp 97.3°F | Ht 64.0 in | Wt 254.8 lb

## 2017-09-02 DIAGNOSIS — S80922A Unspecified superficial injury of left lower leg, initial encounter: Secondary | ICD-10-CM

## 2017-09-02 DIAGNOSIS — J0110 Acute frontal sinusitis, unspecified: Secondary | ICD-10-CM | POA: Diagnosis not present

## 2017-09-02 DIAGNOSIS — T148XXA Other injury of unspecified body region, initial encounter: Secondary | ICD-10-CM

## 2017-09-02 MED ORDER — AMOXICILLIN 500 MG PO CAPS: 500.0000 mg | ORAL_CAPSULE | Freq: Three times a day (TID) | ORAL | 0 refills | Status: DC

## 2017-09-02 MED ORDER — NAPROXEN 500 MG PO TBEC: 500.0000 mg | DELAYED_RELEASE_TABLET | Freq: Two times a day (BID) | ORAL | 0 refills | Status: DC

## 2017-09-02 NOTE — Progress Notes (Signed)
BP 115/73 (BP Location: Left Arm, Patient Position: Sitting, Cuff Size: Large)   Pulse 84   Temp (!) 97.3 F (36.3 C) (Oral)   Ht 5\' 4"  (1.626 m)   Wt 254 lb 12.8 oz (115.6 kg)   BMI 43.74 kg/m    Subjective:    Patient ID: Sara [name], female    DOB: 03-30-1946, 72 y.o.   MRN: [id number]  HPI: Sara [name] is a 72 y.o. female presenting on 09/02/2017 for Leg Pain (Left lower leg, tripped over drop cord this AM)  This morning was starting to fall and felt her left calf muscle have sharp pain. Is able to walk, but most pain with dorsiflexion on the foot.  Has not used anything for it yet.  This patient has had many days of sinus headache and postnasal drainage. There is copious drainage at times. Denies any fever at this time. There has been a history of sinus infections in the past.  No history of sinus surgery. There is cough at night. It has become more prevalent in recent days.  Past Medical History:  Diagnosis Date  . Arthritis    Arthritis -hands -Bil. knee replacemnts  . AVNRT (AV nodal re-entry tachycardia) ([place])    s/p RFCA 09/2011  . Bronchitis    none recent  . CAD (coronary artery disease)    a. s/p Xience DES x 3 to LAD;  b. nuc study 02/27/10: EF 67% no ischemia;  c.  echo 11/12: Mild LVH, EF 64-33%, grade 1 diastolic dysfunction, moderate LAE;  d.  LHC 07/03/11: LAD stents patent, RCA 25%, EF 55-65% ;  e. Adeno. Myoview 5/14:  No ischemia, EF 68%  . Diverticular disease   . GERD (gastroesophageal reflux disease)   . Hemorrhoid   . Hepatitis    yellow jaundice as a child   . HTN (hypertension)   . Hx of echocardiogram    a.  Echo (05/2011):  Mild LVH, EF 55-60%, Gr 1 DD, mod LAE.b. Echo (11/14):  Mild LVH, mild focal basal septal hypertrophy, EF 55-60%, Gr 1 DD, mild LAE, atrial septal aneurysm  . Hyperlipidemia   . Hypertension   . Impaired hearing    bilateral- no hearing aids  . Jaundice   . Myocardial infarction (HCC)    hx of x 2   . Obesity   . OSA  (obstructive sleep apnea)    needs no cpap per patient   . Other psoriasis    ongoing- none at present.  . Vertigo    Relevant past medical, surgical, family and social history reviewed and updated as indicated. Interim medical history since our last visit reviewed. Allergies and medications reviewed and updated. DATA REVIEWED: CHART IN EPIC  Family History reviewed for pertinent findings.  Review of Systems  Constitutional: Negative.   HENT: Negative.   Eyes: Negative.   Respiratory: Negative.   Gastrointestinal: Negative.   Genitourinary: Negative.   Musculoskeletal: Positive for gait problem and myalgias.    Allergies as of 09/02/2017      Reactions   Nifedipine Other (See Comments)   Brings blood pressure up really fast PROCARDIA   Tape    bleeding   Codeine Itching   Crestor [rosuvastatin] Other (See Comments)   myalgias      Medication List        Accurate as of 09/02/17  2:47 PM. Always use your most recent med list.  amoxicillin 500 MG capsule Commonly known as:  AMOXIL Take 1 capsule (500 mg total) by mouth 3 (three) times daily.   aspirin EC 81 MG tablet Take 81 mg by mouth daily.   atorvastatin 80 MG tablet Commonly known as:  LIPITOR Take 80 mg by mouth every other day.   diltiazem 120 MG 24 hr capsule Commonly known as:  CARDIZEM CD TAKE (1) CAPSULE DAILY   fluticasone 50 MCG/ACT nasal spray Commonly known as:  FLONASE Place 1 spray into both nostrils 2 (two) times daily as needed for allergies or rhinitis.   furosemide 20 MG tablet Commonly known as:  LASIX TAKE 1 TABLET DAILY   hyoscyamine 0.375 MG 12 hr tablet Commonly known as:  LEVBID Take 1 tablet (0.375 mg total) by mouth 2 (two) times daily.   losartan 50 MG tablet Commonly known as:  COZAAR TAKE 1 TABLET DAILY   meclizine 25 MG tablet Commonly known as:  ANTIVERT Take 25 mg by mouth 2 (two) times daily as needed for dizziness. Reported on 12/05/2015   naproxen 500  MG EC tablet Commonly known as:  EC NAPROSYN Take 1 tablet (500 mg total) by mouth 2 (two) times daily with a meal.   nitroGLYCERIN 0.4 MG SL tablet Commonly known as:  NITROSTAT Place 1 tablet (0.4 mg total) under the tongue every 5 (five) minutes as needed for chest pain (x 3 doses). Reported on 12/05/2015   omeprazole 20 MG capsule Commonly known as:  PRILOSEC TAKE (1) CAPSULE DAILY   potassium chloride 10 MEQ tablet Commonly known as:  K-DUR TAKE 1 TABLET DAILY   spironolactone 25 MG tablet Commonly known as:  ALDACTONE TAKE (1/2) TABLET TWICE DAILY.   SUPER CALCIUM 600 + D3 PO Take 1 tablet by mouth daily.          Objective:    BP 115/73 (BP Location: Left Arm, Patient Position: Sitting, Cuff Size: Large)   Pulse 84   Temp (!) 97.3 F (36.3 C) (Oral)   Ht 5\' 4"  (1.626 m)   Wt 254 lb 12.8 oz (115.6 kg)   BMI 43.74 kg/m   Allergies  Allergen Reactions  . Nifedipine Other (See Comments)    Brings blood pressure up really fast PROCARDIA  . Tape     bleeding  . Codeine Itching  . Crestor [Rosuvastatin] Other (See Comments)    myalgias    Wt Readings from Last 3 Encounters:  09/02/17 254 lb 12.8 oz (115.6 kg)  08/28/17 254 lb (115.2 kg)  05/21/17 251 lb 12.8 oz (114.2 kg)    Physical Exam  Constitutional: She is oriented to person, place, and time. She appears well-developed and well-nourished.  HENT:  Head: Normocephalic and atraumatic.  Right Ear: A middle ear effusion is present.  Left Ear: A middle ear effusion is present.  Nose: Mucosal edema present. Right sinus exhibits maxillary sinus tenderness and frontal sinus tenderness. Left sinus exhibits maxillary sinus tenderness and frontal sinus tenderness.  Mouth/Throat: Posterior oropharyngeal edema present.  Eyes: Conjunctivae and EOM are normal. Pupils are equal, round, and reactive to light.  Cardiovascular: Normal rate, regular rhythm, normal heart sounds and intact distal pulses.  Pulmonary/Chest:  Effort normal and breath sounds normal.  Abdominal: Soft. Bowel sounds are normal.  Musculoskeletal:       Left lower leg: She exhibits tenderness. She exhibits no swelling, no deformity and no laceration.       Legs: Neurological: She is alert and oriented to person,  place, and time. She has normal reflexes.  Skin: Skin is warm and dry. No rash noted.  Psychiatric: She has a normal mood and affect. Her behavior is normal. Judgment and thought content normal.        Assessment & Plan:   1. Muscle strain - naproxen (EC NAPROSYN) 500 MG EC tablet; Take 1 tablet (500 mg total) by mouth 2 (two) times daily with a meal.  Dispense: 20 tablet; Refill: 0  2. Acute non-recurrent frontal sinusitis - amoxicillin (AMOXIL) 500 MG capsule; Take 1 capsule (500 mg total) by mouth 3 (three) times daily.  Dispense: 30 capsule; Refill: 0   Continue all other maintenance medications as listed above.  Follow up plan: Return if symptoms worsen or fail to improve.  Educational handout given for [place] PA-C [place] [address]  [place], [place] [address] [phone]   09/02/2017, 2:47 PM

## 2017-09-02 NOTE — Patient Instructions (Signed)
In a few days you may receive a survey in the mail or online from [employer] regarding your visit with us today. Please take a moment to fill this out. Your feedback is very important to our whole office. It can help us better understand your needs as well as improve your experience and satisfaction. Thank you for taking your time to complete it. We care about you.  Yong Grieser, PA-C  

## 2017-09-20 ENCOUNTER — Other Ambulatory Visit: Payer: Self-pay | Admitting: Physician Assistant

## 2017-09-20 MED ORDER — CEFDINIR 300 MG PO CAPS: 300.0000 mg | ORAL_CAPSULE | Freq: Two times a day (BID) | ORAL | 0 refills | Status: DC

## 2017-09-20 NOTE — Telephone Encounter (Signed)
Patient aware.

## 2017-09-20 NOTE — Telephone Encounter (Signed)
Aware. 

## 2017-09-20 NOTE — Addendum Note (Signed)
Addended by: Thana Ates on: 09/20/2017 03:39 PM   Modules accepted: Orders

## 2017-09-20 NOTE — Telephone Encounter (Signed)
Prescription sent to pharmacy  [facility]

## 2017-10-01 ENCOUNTER — Other Ambulatory Visit: Payer: Self-pay | Admitting: *Deleted

## 2017-10-01 DIAGNOSIS — R609 Edema, unspecified: Secondary | ICD-10-CM

## 2017-10-01 DIAGNOSIS — I10 Essential (primary) hypertension: Secondary | ICD-10-CM

## 2017-10-01 MED ORDER — FUROSEMIDE 20 MG PO TABS: 20.0000 mg | ORAL_TABLET | Freq: Every day | ORAL | 0 refills | Status: DC

## 2017-10-01 MED ORDER — POTASSIUM CHLORIDE ER 10 MEQ PO TBCR: 10.0000 meq | EXTENDED_RELEASE_TABLET | Freq: Every day | ORAL | 0 refills | Status: DC

## 2017-10-09 DIAGNOSIS — E785 Hyperlipidemia, unspecified: Secondary | ICD-10-CM | POA: Diagnosis not present

## 2017-10-09 DIAGNOSIS — K219 Gastro-esophageal reflux disease without esophagitis: Secondary | ICD-10-CM | POA: Diagnosis not present

## 2017-10-09 DIAGNOSIS — I951 Orthostatic hypotension: Secondary | ICD-10-CM | POA: Diagnosis not present

## 2017-10-09 DIAGNOSIS — Z6841 Body Mass Index (BMI) 40.0 and over, adult: Secondary | ICD-10-CM | POA: Diagnosis not present

## 2017-10-09 DIAGNOSIS — I252 Old myocardial infarction: Secondary | ICD-10-CM | POA: Diagnosis not present

## 2017-10-09 DIAGNOSIS — I509 Heart failure, unspecified: Secondary | ICD-10-CM | POA: Diagnosis not present

## 2017-10-09 DIAGNOSIS — I110 Hypertensive heart disease with heart failure: Secondary | ICD-10-CM | POA: Diagnosis not present

## 2017-10-09 DIAGNOSIS — I499 Cardiac arrhythmia, unspecified: Secondary | ICD-10-CM | POA: Diagnosis not present

## 2017-10-09 DIAGNOSIS — I2510 Atherosclerotic heart disease of native coronary artery without angina pectoris: Secondary | ICD-10-CM | POA: Diagnosis not present

## 2017-10-11 ENCOUNTER — Telehealth: Payer: Self-pay | Admitting: Physician Assistant

## 2017-10-11 NOTE — Telephone Encounter (Signed)
All I did was refill the one on her list. No really sure why it is so much, is she in her doughnut hole?

## 2017-10-11 NOTE — Telephone Encounter (Signed)
Patient advised to call pharmacy and see why so expensive.

## 2017-10-28 ENCOUNTER — Other Ambulatory Visit: Payer: Self-pay | Admitting: Physician Assistant

## 2017-10-28 ENCOUNTER — Telehealth: Payer: Self-pay | Admitting: Physician Assistant

## 2017-10-28 MED ORDER — ALPRAZOLAM 0.5 MG PO TABS: 0.2500 mg | ORAL_TABLET | Freq: Two times a day (BID) | ORAL | 1 refills | Status: DC | PRN

## 2017-10-28 NOTE — Telephone Encounter (Signed)
Medication is sent in 

## 2017-10-29 NOTE — Telephone Encounter (Signed)
Patient aware.

## 2017-10-30 ENCOUNTER — Telehealth: Payer: Self-pay | Admitting: Physician Assistant

## 2017-10-30 NOTE — Telephone Encounter (Signed)
[facility]

## 2017-10-31 ENCOUNTER — Other Ambulatory Visit: Payer: Self-pay | Admitting: Physician Assistant

## 2017-10-31 NOTE — Telephone Encounter (Signed)
It was already sent to [facility] on 10/28/17, can you just call it in?

## 2017-11-01 NOTE — Telephone Encounter (Signed)
Rx called in to pharmacy. 

## 2017-11-07 ENCOUNTER — Other Ambulatory Visit: Payer: Self-pay | Admitting: Physician Assistant

## 2017-11-07 DIAGNOSIS — J019 Acute sinusitis, unspecified: Secondary | ICD-10-CM

## 2017-11-14 ENCOUNTER — Ambulatory Visit (INDEPENDENT_AMBULATORY_CARE_PROVIDER_SITE_OTHER): Payer: Medicare HMO | Admitting: Nurse Practitioner

## 2017-11-14 ENCOUNTER — Encounter: Payer: Self-pay | Admitting: Nurse Practitioner

## 2017-11-14 VITALS — BP 135/62 | HR 88 | Temp 98.4°F | Ht 64.0 in | Wt 255.0 lb

## 2017-11-14 DIAGNOSIS — N3000 Acute cystitis without hematuria: Secondary | ICD-10-CM | POA: Diagnosis not present

## 2017-11-14 DIAGNOSIS — R300 Dysuria: Secondary | ICD-10-CM | POA: Diagnosis not present

## 2017-11-14 LAB — URINALYSIS
Bilirubin, UA: NEGATIVE
Glucose, UA: NEGATIVE
Ketones, UA: NEGATIVE
NITRITE UA: POSITIVE — AB
PH UA: 5.5 (ref 5.0–7.5)
Specific Gravity, UA: 1.01 (ref 1.005–1.030)
UUROB: 0.2 mg/dL (ref 0.2–1.0)

## 2017-11-14 MED ORDER — DOXYCYCLINE HYCLATE 100 MG PO TABS: 100.0000 mg | ORAL_TABLET | Freq: Two times a day (BID) | ORAL | 0 refills | Status: DC

## 2017-11-14 NOTE — Progress Notes (Signed)
   Subjective:    Patient ID: Sara [name], female    DOB: 17-Nov-1945, 72 y.o.   MRN: [id number]  HPI Patient comes in today c/o dysuria and frequency and urgency. Having some suprapubic pain. Started 2 days ago.    Review of Systems  Respiratory: Negative.   Cardiovascular: Negative.   Genitourinary: Positive for dysuria, frequency, pelvic pain and urgency.  Neurological: Negative.   Psychiatric/Behavioral: Negative.   All other systems reviewed and are negative.      Objective:   Physical Exam  Constitutional: She is oriented to person, place, and time. She appears well-developed and well-nourished. No distress.  Cardiovascular: Normal rate and regular rhythm.  Pulmonary/Chest: Effort normal and breath sounds normal.  Abdominal: Soft. There is tenderness (suprapubic ain on palaption).  Genitourinary:  Genitourinary Comments: No CVA tenderness  Neurological: She is alert and oriented to person, place, and time.  Skin: Skin is warm.  Psychiatric: She has a normal mood and affect. Her behavior is normal. Judgment and thought content normal.    BP 135/62   Pulse 88   Temp 98.4 F (36.9 C) (Oral)   Ht 5\' 4"  (1.626 m)   Wt 255 lb (115.7 kg)   BMI 43.77 kg/m   UA  (+) nitrites and (2+) leuks      Assessment & Plan:   1. Dysuria   2. Acute cystitis without hematuria    Meds ordered this encounter  Medications  . doxycycline (VIBRA-TABS) 100 MG tablet    Sig: Take 1 tablet (100 mg total) by mouth 2 (two) times daily. 1 po bid    Dispense:  14 tablet    Refill:  0    Order Specific Question:   Supervising Provider    Answer:   Evette Doffing, CAROL L [[id number]]   Take medication as prescribe Cotton underwear Take shower not bath Cranberry juice, yogurt Force fluids AZO over the counter X2 days Culture pending RTO prn  Mary-Margaret Hassell Done, FNP

## 2017-11-14 NOTE — Patient Instructions (Signed)
Take medication as prescribe Cotton underwear Take shower not bath Cranberry juice, yogurt Force fluids AZO over the counter X2 days Culture pending RTO prn  

## 2017-11-16 LAB — URINE CULTURE

## 2017-11-26 NOTE — Progress Notes (Signed)
May use delsym 2 tsp BID for cough Cough drops as needed flonase nasal spray for congestion Tylenol for aches and pain and fever

## 2017-12-19 DIAGNOSIS — R69 Illness, unspecified: Secondary | ICD-10-CM | POA: Diagnosis not present

## 2017-12-24 ENCOUNTER — Ambulatory Visit (INDEPENDENT_AMBULATORY_CARE_PROVIDER_SITE_OTHER): Payer: Medicare HMO | Admitting: Physician Assistant

## 2017-12-24 ENCOUNTER — Encounter: Payer: Self-pay | Admitting: Physician Assistant

## 2017-12-24 VITALS — BP 133/73 | HR 80 | Temp 97.3°F | Ht 64.0 in | Wt 255.6 lb

## 2017-12-24 DIAGNOSIS — T148XXA Other injury of unspecified body region, initial encounter: Secondary | ICD-10-CM | POA: Diagnosis not present

## 2017-12-24 DIAGNOSIS — N3001 Acute cystitis with hematuria: Secondary | ICD-10-CM

## 2017-12-24 DIAGNOSIS — M9901 Segmental and somatic dysfunction of cervical region: Secondary | ICD-10-CM | POA: Diagnosis not present

## 2017-12-24 LAB — MICROSCOPIC EXAMINATION: WBC, UA: >30 /hpf — AB (ref 0–5)

## 2017-12-24 LAB — URINALYSIS, COMPLETE
BILIRUBIN UA: NEGATIVE
Glucose, UA: NEGATIVE
KETONES UA: NEGATIVE
Nitrite, UA: NEGATIVE
SPEC GRAV UA: 1.015 (ref 1.005–1.030)
Urobilinogen, Ur: 0.2 mg/dL (ref 0.2–1.0)
pH, UA: 7 (ref 5.0–7.5)

## 2017-12-24 MED ORDER — CYCLOBENZAPRINE HCL 5 MG PO TABS: 5.0000 mg | ORAL_TABLET | Freq: Three times a day (TID) | ORAL | 0 refills | Status: DC | PRN

## 2017-12-24 MED ORDER — SULFAMETHOXAZOLE-TRIMETHOPRIM 800-160 MG PO TABS: 1.0000 | ORAL_TABLET | Freq: Two times a day (BID) | ORAL | 0 refills | Status: DC

## 2017-12-24 MED ORDER — NAPROXEN 500 MG PO TBEC: 500.0000 mg | DELAYED_RELEASE_TABLET | Freq: Two times a day (BID) | ORAL | 0 refills | Status: DC

## 2017-12-24 NOTE — Progress Notes (Signed)
BP 133/73   Pulse 80   Temp (!) 97.3 F (36.3 C) (Oral)   Ht 5\' 4"  (1.626 m)   Wt 255 lb 9.6 oz (115.9 kg)   BMI 43.87 kg/m    Subjective:    Patient ID: Sara [name], female    DOB: 03-May-1946, 72 y.o.   MRN: [id number]  HPI: Sara [name] is a 72 y.o. female presenting on 12/24/2017 for Dysuria; trouble urinating; and Shoulder Pain (right )  This patient has had several days of dysuria, frequency and nocturia. There is also pain over the bladder in the suprapubic region, no back pain. Denies leakage or hematuria.  Denies fever or chills. No pain in flank area.  Also with cervical pain on the right side with pain from the occipital area down to the suprascapular area.   Past Medical History:  Diagnosis Date  . Arthritis    Arthritis -hands -Bil. knee replacemnts  . AVNRT (AV nodal re-entry tachycardia) ([place])    s/p RFCA 09/2011  . Bronchitis    none recent  . CAD (coronary artery disease)    a. s/p Xience DES x 3 to LAD;  b. nuc study 02/27/10: EF 67% no ischemia;  c.  echo 11/12: Mild LVH, EF 63-84%, grade 1 diastolic dysfunction, moderate LAE;  d.  LHC 07/03/11: LAD stents patent, RCA 25%, EF 55-65% ;  e. [place]. Myoview 5/14:  No ischemia, EF 68%  . Diverticular disease   . GERD (gastroesophageal reflux disease)   . Hemorrhoid   . Hepatitis    yellow jaundice as a child   . HTN (hypertension)   . Hx of echocardiogram    a.  Echo (05/2011):  Mild LVH, EF 55-60%, Gr 1 DD, mod LAE.b. Echo (11/14):  Mild LVH, mild focal basal septal hypertrophy, EF 55-60%, Gr 1 DD, mild LAE, atrial septal aneurysm  . Hyperlipidemia   . Hypertension   . Impaired hearing    bilateral- no hearing aids  . Jaundice   . Myocardial infarction (HCC)    hx of x 2   . Obesity   . OSA (obstructive sleep apnea)    needs no cpap per patient   . Other psoriasis    ongoing- none at present.  . Vertigo    Relevant past medical, surgical, family and social history reviewed and updated as indicated.  Interim medical history since our last visit reviewed. Allergies and medications reviewed and updated. DATA REVIEWED: CHART IN EPIC  Family History reviewed for pertinent findings.  Review of Systems  Constitutional: Negative.   HENT: Negative.   Eyes: Negative.   Respiratory: Negative.   Gastrointestinal: Negative.   Genitourinary: Positive for difficulty urinating, dysuria and urgency. Negative for flank pain.  Musculoskeletal: Positive for arthralgias, back pain, myalgias and neck pain.    Allergies as of 12/24/2017      Reactions   Nifedipine Other (See Comments)   Brings blood pressure up really fast PROCARDIA   Tape    bleeding   Codeine Itching   Crestor [rosuvastatin] Other (See Comments)   myalgias      Medication List        Accurate as of 12/24/17  6:12 PM. Always use your most recent med list.          ALPRAZolam 0.5 MG tablet Commonly known as:  XANAX Take 0.5 tablets (0.25 mg total) by mouth 2 (two) times daily as needed for anxiety.   aspirin EC 81 MG  tablet Take 81 mg by mouth daily.   atorvastatin 80 MG tablet Commonly known as:  LIPITOR Take 80 mg by mouth every other day.   cyclobenzaprine 5 MG tablet Commonly known as:  FLEXERIL Take 1 tablet (5 mg total) by mouth 3 (three) times daily as needed for muscle spasms.   diltiazem 120 MG 24 hr capsule Commonly known as:  CARDIZEM CD TAKE (1) CAPSULE DAILY   fluticasone 50 MCG/ACT nasal spray Commonly known as:  FLONASE SPRAY 1 SPRAY INTO EACH NOSTRIL TWICE A DAY   furosemide 20 MG tablet Commonly known as:  LASIX Take 1 tablet (20 mg total) by mouth daily.   hyoscyamine 0.375 MG 12 hr tablet Commonly known as:  LEVBID Take 1 tablet (0.375 mg total) by mouth 2 (two) times daily.   losartan 50 MG tablet Commonly known as:  COZAAR TAKE 1 TABLET DAILY   meclizine 25 MG tablet Commonly known as:  ANTIVERT Take 25 mg by mouth 2 (two) times daily as needed for dizziness. Reported on  12/05/2015   naproxen 500 MG EC tablet Commonly known as:  EC NAPROSYN Take 1 tablet (500 mg total) by mouth 2 (two) times daily with a meal.   nitroGLYCERIN 0.4 MG SL tablet Commonly known as:  NITROSTAT Place 1 tablet (0.4 mg total) under the tongue every 5 (five) minutes as needed for chest pain (x 3 doses). Reported on 12/05/2015   omeprazole 20 MG capsule Commonly known as:  PRILOSEC TAKE (1) CAPSULE DAILY   potassium chloride 10 MEQ tablet Commonly known as:  K-DUR Take 1 tablet (10 mEq total) by mouth daily.   spironolactone 25 MG tablet Commonly known as:  ALDACTONE TAKE (1/2) TABLET TWICE DAILY.   sulfamethoxazole-trimethoprim 800-160 MG tablet Commonly known as:  BACTRIM DS Take 1 tablet by mouth 2 (two) times daily.   SUPER CALCIUM 600 + D3 PO Take 1 tablet by mouth daily.          Objective:    BP 133/73   Pulse 80   Temp (!) 97.3 F (36.3 C) (Oral)   Ht 5\' 4"  (1.626 m)   Wt 255 lb 9.6 oz (115.9 kg)   BMI 43.87 kg/m   Allergies  Allergen Reactions  . Nifedipine Other (See Comments)    Brings blood pressure up really fast PROCARDIA  . Tape     bleeding  . Codeine Itching  . Crestor [Rosuvastatin] Other (See Comments)    myalgias    Wt Readings from Last 3 Encounters:  12/24/17 255 lb 9.6 oz (115.9 kg)  11/14/17 255 lb (115.7 kg)  09/02/17 254 lb 12.8 oz (115.6 kg)    Physical Exam  Constitutional: She is oriented to person, place, and time. She appears well-developed and well-nourished.  HENT:  Head: Normocephalic and atraumatic.  Eyes: Pupils are equal, round, and reactive to light. Conjunctivae are normal.  Cardiovascular: Normal rate, regular rhythm, normal heart sounds and intact distal pulses.  Pulmonary/Chest: Effort normal and breath sounds normal.  Abdominal: Soft. Bowel sounds are normal. She exhibits no distension and no mass. There is tenderness in the suprapubic area. There is no rebound, no guarding and no CVA tenderness.    Musculoskeletal:       Cervical back: She exhibits tenderness, pain and spasm.       Back:  Neurological: She is alert and oriented to person, place, and time. She has normal reflexes.  Skin: Skin is warm and dry. No rash noted.  Psychiatric: She has a normal mood and affect. Her behavior is normal. Judgment and thought content normal.    Results for orders placed or performed in visit on 12/24/17  Microscopic Examination  Result Value Ref Range   WBC, UA >30 (A) 0 - 5 /hpf   RBC, UA 3-10 (A) 0 - 2 /hpf   Epithelial Cells (non renal) 0-10 0 - 10 /hpf   Renal Epithel, UA 0-10 (A) None seen /hpf   Bacteria, UA Few None seen/Few  Urinalysis, Complete  Result Value Ref Range   Specific Gravity, UA 1.015 1.005 - 1.030   pH, UA 7.0 5.0 - 7.5   Color, UA Yellow Yellow   Appearance Ur Clear Clear   Leukocytes, UA 2+ (A) Negative   Protein, UA Trace (A) Negative/Trace   Glucose, UA Negative Negative   Ketones, UA Negative Negative   RBC, UA 2+ (A) Negative   Bilirubin, UA Negative Negative   Urobilinogen, Ur 0.2 0.2 - 1.0 mg/dL   Nitrite, UA Negative Negative   Microscopic Examination See below:       Assessment & Plan:   1. Acute cystitis with hematuria - cyclobenzaprine (FLEXERIL) 5 MG tablet; Take 1 tablet (5 mg total) by mouth 3 (three) times daily as needed for muscle spasms.  Dispense: 30 tablet; Refill: 0 - sulfamethoxazole-trimethoprim (BACTRIM DS) 800-160 MG tablet; Take 1 tablet by mouth 2 (two) times daily.  Dispense: 14 tablet; Refill: 0 - Urine Culture - Urinalysis, Complete - Microscopic Examination  2. Cervical somatic dysfunction - cyclobenzaprine (FLEXERIL) 5 MG tablet; Take 1 tablet (5 mg total) by mouth 3 (three) times daily as needed for muscle spasms.  Dispense: 30 tablet; Refill: 0 - sulfamethoxazole-trimethoprim (BACTRIM DS) 800-160 MG tablet; Take 1 tablet by mouth 2 (two) times daily.  Dispense: 14 tablet; Refill: 0  3. Muscle strain - naproxen (EC  NAPROSYN) 500 MG EC tablet; Take 1 tablet (500 mg total) by mouth 2 (two) times daily with a meal.  Dispense: 20 tablet; Refill: 0   Continue all other maintenance medications as listed above.  Follow up plan: No follow-ups on file.  Educational handout given for [place] PA-C [facility] [address]  [place], [place] [address] [phone]   12/24/2017, 6:12 PM

## 2017-12-26 LAB — URINE CULTURE

## 2018-01-06 ENCOUNTER — Other Ambulatory Visit: Payer: Self-pay | Admitting: Physician Assistant

## 2018-01-06 DIAGNOSIS — R609 Edema, unspecified: Secondary | ICD-10-CM

## 2018-01-06 DIAGNOSIS — I10 Essential (primary) hypertension: Secondary | ICD-10-CM

## 2018-02-09 ENCOUNTER — Other Ambulatory Visit: Payer: Self-pay | Admitting: Physician Assistant

## 2018-02-09 DIAGNOSIS — J019 Acute sinusitis, unspecified: Secondary | ICD-10-CM

## 2018-03-15 ENCOUNTER — Other Ambulatory Visit: Payer: Self-pay | Admitting: Physician Assistant

## 2018-04-04 ENCOUNTER — Other Ambulatory Visit: Payer: Self-pay | Admitting: Physician Assistant

## 2018-04-04 ENCOUNTER — Other Ambulatory Visit: Payer: Self-pay

## 2018-04-04 MED ORDER — LOSARTAN POTASSIUM 50 MG PO TABS: 50.0000 mg | ORAL_TABLET | Freq: Every day | ORAL | 0 refills | Status: DC

## 2018-04-08 ENCOUNTER — Ambulatory Visit (INDEPENDENT_AMBULATORY_CARE_PROVIDER_SITE_OTHER): Payer: Medicare HMO | Admitting: Physician Assistant

## 2018-04-08 ENCOUNTER — Encounter: Payer: Self-pay | Admitting: Physician Assistant

## 2018-04-08 VITALS — BP 138/75 | HR 70 | Temp 96.7°F | Ht 64.0 in | Wt 256.2 lb

## 2018-04-08 DIAGNOSIS — R600 Localized edema: Secondary | ICD-10-CM

## 2018-04-08 DIAGNOSIS — I10 Essential (primary) hypertension: Secondary | ICD-10-CM | POA: Diagnosis not present

## 2018-04-08 DIAGNOSIS — E782 Mixed hyperlipidemia: Secondary | ICD-10-CM | POA: Diagnosis not present

## 2018-04-08 DIAGNOSIS — R7309 Other abnormal glucose: Secondary | ICD-10-CM | POA: Diagnosis not present

## 2018-04-08 LAB — BAYER DCA HB A1C WAIVED: HB A1C (BAYER DCA - WAIVED): 6.5 % (ref <7.0)

## 2018-04-08 MED ORDER — OMEPRAZOLE 20 MG PO CPDR: 20.0000 mg | DELAYED_RELEASE_CAPSULE | Freq: Every day | ORAL | 3 refills | Status: DC

## 2018-04-08 MED ORDER — ATORVASTATIN CALCIUM 80 MG PO TABS: 80.0000 mg | ORAL_TABLET | Freq: Every day | ORAL | 3 refills | Status: DC

## 2018-04-08 MED ORDER — FUROSEMIDE 20 MG PO TABS: 20.0000 mg | ORAL_TABLET | Freq: Every day | ORAL | 3 refills | Status: DC

## 2018-04-08 MED ORDER — DILTIAZEM HCL ER COATED BEADS 120 MG PO CP24: ORAL_CAPSULE | ORAL | 3 refills | Status: DC

## 2018-04-08 MED ORDER — SPIRONOLACTONE 25 MG PO TABS: 12.5000 mg | ORAL_TABLET | Freq: Two times a day (BID) | ORAL | 3 refills | Status: DC

## 2018-04-08 MED ORDER — POTASSIUM CHLORIDE ER 10 MEQ PO TBCR: 10.0000 meq | EXTENDED_RELEASE_TABLET | Freq: Every day | ORAL | 3 refills | Status: DC

## 2018-04-08 MED ORDER — LOSARTAN POTASSIUM 50 MG PO TABS: 50.0000 mg | ORAL_TABLET | Freq: Every day | ORAL | 3 refills | Status: DC

## 2018-04-08 MED ORDER — HYDROCORTISONE 2.5 % EX OINT: TOPICAL_OINTMENT | Freq: Two times a day (BID) | CUTANEOUS | 5 refills | Status: DC

## 2018-04-09 ENCOUNTER — Telehealth: Payer: Self-pay | Admitting: Physician Assistant

## 2018-04-09 LAB — CMP14+EGFR
A/G RATIO: 1.5 (ref 1.2–2.2)
ALT: 15 IU/L (ref 0–32)
AST: 22 IU/L (ref 0–40)
Albumin: 3.8 g/dL (ref 3.5–4.8)
Alkaline Phosphatase: 80 IU/L (ref 39–117)
BUN/Creatinine Ratio: 18 (ref 12–28)
BUN: 13 mg/dL (ref 8–27)
Bilirubin Total: 0.4 mg/dL (ref 0.0–1.2)
CALCIUM: 9.2 mg/dL (ref 8.7–10.3)
CO2: 22 mmol/L (ref 20–29)
CREATININE: 0.72 mg/dL (ref 0.57–1.00)
Chloride: 106 mmol/L (ref 96–106)
GFR, EST AFRICAN AMERICAN: 97 mL/min/{1.73_m2} (ref >59)
GFR, EST NON AFRICAN AMERICAN: 84 mL/min/{1.73_m2} (ref >59)
GLOBULIN, TOTAL: 2.5 g/dL (ref 1.5–4.5)
Glucose: 134 mg/dL — ABNORMAL HIGH (ref 65–99)
Potassium: 4.1 mmol/L (ref 3.5–5.2)
SODIUM: 142 mmol/L (ref 134–144)
TOTAL PROTEIN: 6.3 g/dL (ref 6.0–8.5)

## 2018-04-09 LAB — LIPID PANEL
CHOL/HDL RATIO: 3 ratio (ref 0.0–4.4)
Cholesterol, Total: 162 mg/dL (ref 100–199)
HDL: 54 mg/dL (ref >39)
LDL Calculated: 93 mg/dL (ref 0–99)
TRIGLYCERIDES: 77 mg/dL (ref 0–149)
VLDL Cholesterol Cal: 15 mg/dL (ref 5–40)

## 2018-04-09 LAB — CBC WITH DIFFERENTIAL/PLATELET
BASOS ABS: 0 10*3/uL (ref 0.0–0.2)
BASOS: 0 %
EOS (ABSOLUTE): 0.3 10*3/uL (ref 0.0–0.4)
Eos: 6 %
Hematocrit: 42.5 % (ref 34.0–46.6)
Hemoglobin: 14.1 g/dL (ref 11.1–15.9)
IMMATURE GRANS (ABS): 0 10*3/uL (ref 0.0–0.1)
IMMATURE GRANULOCYTES: 0 %
LYMPHS: 42 %
Lymphocytes Absolute: 2.1 10*3/uL (ref 0.7–3.1)
MCH: 31.8 pg (ref 26.6–33.0)
MCHC: 33.2 g/dL (ref 31.5–35.7)
MCV: 96 fL (ref 79–97)
Monocytes Absolute: 0.4 10*3/uL (ref 0.1–0.9)
Monocytes: 7 %
NEUTROS PCT: 45 %
Neutrophils Absolute: 2.3 10*3/uL (ref 1.4–7.0)
PLATELETS: 181 10*3/uL (ref 150–450)
RBC: 4.43 x10E6/uL (ref 3.77–5.28)
RDW: 14.4 % (ref 12.3–15.4)
WBC: 5.1 10*3/uL (ref 3.4–10.8)

## 2018-04-09 LAB — TSH: TSH: 2.44 u[IU]/mL (ref 0.450–4.500)

## 2018-04-09 NOTE — Telephone Encounter (Signed)
Patient aware of results.

## 2018-04-10 NOTE — Progress Notes (Signed)
BP 138/75   Pulse 70   Temp (!) 96.7 F (35.9 C) (Oral)   Ht _0  (1.626 m)   Wt 256 lb 3.2 oz (116.2 kg)   BMI 43.98 kg/m    Subjective:    Patient ID: Sara [name], female    DOB: 1946/04/10, 72 y.o.   MRN: [id number]  HPI: Sara [name] is a 72 y.o. female presenting on 04/08/2018 for Hyperlipidemia (6 month follow up ) and Hypertension This patient comes in for periodic recheck on medications and conditions including hypertension, CAD, OA, hyperlipidemia, elevated glucose. She has been stable overall. Her son died in the past few months due to ALS.   All medications are reviewed today. There are no reports of any problems with the medications. All of the medical conditions are reviewed and updated.  Lab work is reviewed and will be ordered as medically necessary. There are no new problems reported with today's visit.    Past Medical History:  Diagnosis Date  . Arthritis    Arthritis -hands -Bil. knee replacemnts  . AVNRT (AV nodal re-entry tachycardia) ([facility])    s/p RFCA 09/2011  . Bronchitis    none recent  . CAD (coronary artery disease)    a. s/p Xience DES x 3 to LAD;  b. nuc study 02/27/10: EF 67% no ischemia;  c.  echo 11/12: Mild LVH, EF 27-03%, grade 1 diastolic dysfunction, moderate LAE;  d.  LHC 07/03/11: LAD stents patent, RCA 25%, EF 55-65% ;  e. [place]. Myoview 5/14:  No ischemia, EF 68%  . Diverticular disease   . GERD (gastroesophageal reflux disease)   . Hemorrhoid   . Hepatitis    yellow jaundice as a child   . HTN (hypertension)   . Hx of echocardiogram    a.  Echo (05/2011):  Mild LVH, EF 55-60%, Gr 1 DD, mod LAE.b. Echo (11/14):  Mild LVH, mild focal basal septal hypertrophy, EF 55-60%, Gr 1 DD, mild LAE, atrial septal aneurysm  . Hyperlipidemia   . Hypertension   . Impaired hearing    bilateral- no hearing aids  . Jaundice   . Myocardial infarction (HCC)    hx of x 2   . Obesity   . OSA (obstructive sleep apnea)    needs no cpap per patient   .  Other psoriasis    ongoing- none at present.  . Vertigo    Relevant past medical, surgical, family and social history reviewed and updated as indicated. Interim medical history since our last visit reviewed. Allergies and medications reviewed and updated. DATA REVIEWED: CHART IN EPIC  Family History reviewed for pertinent findings.  Review of Systems  Constitutional: Negative.  Negative for activity change, fatigue and fever.  HENT: Negative.   Eyes: Negative.   Respiratory: Negative.  Negative for cough.   Cardiovascular: Negative.  Negative for chest pain.  Gastrointestinal: Negative.  Negative for abdominal pain.  Endocrine: Negative.   Genitourinary: Negative.  Negative for dysuria.  Musculoskeletal: Negative.   Skin: Negative.   Neurological: Negative.     Allergies as of 04/08/2018      Reactions   Nifedipine Other (See Comments)   Brings blood pressure up really fast PROCARDIA   Tape    bleeding   Codeine Itching   Crestor [rosuvastatin] Other (See Comments)   myalgias      Medication List        Accurate as of 04/08/18 11:59 PM. Always use your most  recent med list.          ALPRAZolam 0.5 MG tablet Commonly known as:  XANAX Take 0.5 tablets (0.25 mg total) by mouth 2 (two) times daily as needed for anxiety.   aspirin EC 81 MG tablet Take 81 mg by mouth daily.   atorvastatin 80 MG tablet Commonly known as:  LIPITOR Take 1 tablet (80 mg total) by mouth daily.   cyclobenzaprine 5 MG tablet Commonly known as:  FLEXERIL Take 1 tablet (5 mg total) by mouth 3 (three) times daily as needed for muscle spasms.   diltiazem 120 MG 24 hr capsule Commonly known as:  CARDIZEM CD TAKE (1) CAPSULE DAILY   fluticasone 50 MCG/ACT nasal spray Commonly known as:  FLONASE SPRAY 1 SPRAY INTO EACH NOSTRIL TWICE A DAY   furosemide 20 MG tablet Commonly known as:  LASIX Take 1 tablet (20 mg total) by mouth daily.   hydrocortisone 2.5 % ointment Apply topically 2  (two) times daily.   hyoscyamine 0.375 MG 12 hr tablet Commonly known as:  LEVBID Take 1 tablet (0.375 mg total) by mouth 2 (two) times daily.   losartan 50 MG tablet Commonly known as:  COZAAR Take 1 tablet (50 mg total) by mouth daily.   meclizine 25 MG tablet Commonly known as:  ANTIVERT Take 25 mg by mouth 2 (two) times daily as needed for dizziness. Reported on 12/05/2015   naproxen 500 MG EC tablet Commonly known as:  EC NAPROSYN Take 1 tablet (500 mg total) by mouth 2 (two) times daily with a meal.   nitroGLYCERIN 0.4 MG SL tablet Commonly known as:  NITROSTAT Place 1 tablet (0.4 mg total) under the tongue every 5 (five) minutes as needed for chest pain (x 3 doses). Reported on 12/05/2015   omeprazole 20 MG capsule Commonly known as:  PRILOSEC Take 1 capsule (20 mg total) by mouth daily.   potassium chloride 10 MEQ tablet Commonly known as:  K-DUR Take 1 tablet (10 mEq total) by mouth daily.   spironolactone 25 MG tablet Commonly known as:  ALDACTONE Take 0.5 tablets (12.5 mg total) by mouth 2 (two) times daily.   SUPER CALCIUM 600 + D3 PO Take 1 tablet by mouth daily.          Objective:    BP 138/75   Pulse 70   Temp (!) 96.7 F (35.9 C) (Oral)   Ht _0  (1.626 m)   Wt 256 lb 3.2 oz (116.2 kg)   BMI 43.98 kg/m   Allergies  Allergen Reactions  . Nifedipine Other (See Comments)    Brings blood pressure up really fast PROCARDIA  . Tape     bleeding  . Codeine Itching  . Crestor [Rosuvastatin] Other (See Comments)    myalgias    Wt Readings from Last 3 Encounters:  04/08/18 256 lb 3.2 oz (116.2 kg)  12/24/17 255 lb 9.6 oz (115.9 kg)  11/14/17 255 lb (115.7 kg)    Physical Exam  Constitutional: She is oriented to person, place, and time. She appears well-developed and well-nourished.  HENT:  Head: Normocephalic and atraumatic.  Eyes: Pupils are equal, round, and reactive to light. Conjunctivae and EOM are normal.  Cardiovascular: Normal rate,  regular rhythm, normal heart sounds and intact distal pulses.  Pulmonary/Chest: Effort normal and breath sounds normal.  Abdominal: Soft. Bowel sounds are normal.  Neurological: She is alert and oriented to person, place, and time. She has normal reflexes.  Skin: Skin is  warm and dry. No rash noted.  Psychiatric: She has a normal mood and affect. Her behavior is normal. Judgment and thought content normal.    Results for orders placed or performed in visit on 04/08/18  CBC with Differential/Platelet  Result Value Ref Range   WBC 5.1 3.4 - 10.8 x10E3/uL   RBC 4.43 3.77 - 5.28 x10E6/uL   Hemoglobin 14.1 11.1 - 15.9 g/dL   Hematocrit 42.5 34.0 - 46.6 %   MCV 96 79 - 97 fL   MCH 31.8 26.6 - 33.0 pg   MCHC 33.2 31.5 - 35.7 g/dL   RDW 14.4 12.3 - 15.4 %   Platelets 181 150 - 450 x10E3/uL   Neutrophils 45 Not Estab. %   Lymphs 42 Not Estab. %   Monocytes 7 Not Estab. %   Eos 6 Not Estab. %   Basos 0 Not Estab. %   Neutrophils Absolute 2.3 1.4 - 7.0 x10E3/uL   Lymphocytes Absolute 2.1 0.7 - 3.1 x10E3/uL   Monocytes Absolute 0.4 0.1 - 0.9 x10E3/uL   EOS (ABSOLUTE) 0.3 0.0 - 0.4 x10E3/uL   Basophils Absolute 0.0 0.0 - 0.2 x10E3/uL   Immature Granulocytes 0 Not Estab. %   Immature Grans (Abs) 0.0 0.0 - 0.1 x10E3/uL  CMP14+EGFR  Result Value Ref Range   Glucose 134 (H) 65 - 99 mg/dL   BUN 13 8 - 27 mg/dL   Creatinine, Ser 0.72 0.57 - 1.00 mg/dL   GFR calc non Af Amer 84 >59 mL/min/1.73   GFR calc Af Amer 97 >59 mL/min/1.73   BUN/Creatinine Ratio 18 12 - 28   Sodium 142 134 - 144 mmol/L   Potassium 4.1 3.5 - 5.2 mmol/L   Chloride 106 96 - 106 mmol/L   CO2 22 20 - 29 mmol/L   Calcium 9.2 8.7 - 10.3 mg/dL   Total Protein 6.3 6.0 - 8.5 g/dL   Albumin 3.8 3.5 - 4.8 g/dL   Globulin, Total 2.5 1.5 - 4.5 g/dL   Albumin/Globulin Ratio 1.5 1.2 - 2.2   Bilirubin Total 0.4 0.0 - 1.2 mg/dL   Alkaline Phosphatase 80 39 - 117 IU/L   AST 22 0 - 40 IU/L   ALT 15 0 - 32 IU/L  Lipid panel    Result Value Ref Range   Cholesterol, Total 162 100 - 199 mg/dL   Triglycerides 77 0 - 149 mg/dL   HDL 54 >39 mg/dL   VLDL Cholesterol Cal 15 5 - 40 mg/dL   LDL Calculated 93 0 - 99 mg/dL   Chol/HDL Ratio 3.0 0.0 - 4.4 ratio  TSH  Result Value Ref Range   TSH 2.440 0.450 - 4.500 uIU/mL  Bayer DCA Hb A1c Waived  Result Value Ref Range   HB A1C (BAYER DCA - WAIVED) 6.5 <7.0 %      Assessment & Plan:   1. Essential hypertension - diltiazem (CARDIZEM CD) 120 MG 24 hr capsule; TAKE (1) CAPSULE DAILY  Dispense: 90 capsule; Refill: 3 - losartan (COZAAR) 50 MG tablet; Take 1 tablet (50 mg total) by mouth daily.  Dispense: 90 tablet; Refill: 3 - furosemide (LASIX) 20 MG tablet; Take 1 tablet (20 mg total) by mouth daily.  Dispense: 90 tablet; Refill: 3 - CBC with Differential/Platelet - CMP14+EGFR - Lipid panel - TSH - Bayer DCA Hb A1c Waived  2. Edema - spironolactone (ALDACTONE) 25 MG tablet; Take 0.5 tablets (12.5 mg total) by mouth 2 (two) times daily.  Dispense: 90 tablet; Refill: 3 -  furosemide (LASIX) 20 MG tablet; Take 1 tablet (20 mg total) by mouth daily.  Dispense: 90 tablet; Refill: 3  3. Mixed hyperlipidemia - atorvastatin (LIPITOR) 80 MG tablet; Take 1 tablet (80 mg total) by mouth daily.  Dispense: 90 tablet; Refill: 3 - Lipid panel - TSH - Bayer DCA Hb A1c Waived   Continue all other maintenance medications as listed above.  Follow up plan: Return in about 6 months (around 10/07/2018) for recheck.  Educational handout given for [place] PA-C [facility] [address]  [place], [place] [address] [phone]   04/10/2018, 9:07 PM

## 2018-04-28 DIAGNOSIS — M1812 Unilateral primary osteoarthritis of first carpometacarpal joint, left hand: Secondary | ICD-10-CM | POA: Diagnosis not present

## 2018-04-28 DIAGNOSIS — M1811 Unilateral primary osteoarthritis of first carpometacarpal joint, right hand: Secondary | ICD-10-CM | POA: Diagnosis not present

## 2018-04-28 DIAGNOSIS — M79641 Pain in right hand: Secondary | ICD-10-CM | POA: Diagnosis not present

## 2018-04-28 DIAGNOSIS — M79645 Pain in left finger(s): Secondary | ICD-10-CM | POA: Diagnosis not present

## 2018-05-01 ENCOUNTER — Ambulatory Visit (INDEPENDENT_AMBULATORY_CARE_PROVIDER_SITE_OTHER): Payer: Medicare HMO | Admitting: *Deleted

## 2018-05-01 DIAGNOSIS — Z23 Encounter for immunization: Secondary | ICD-10-CM

## 2018-05-07 ENCOUNTER — Other Ambulatory Visit: Payer: Self-pay | Admitting: Physician Assistant

## 2018-05-07 DIAGNOSIS — J019 Acute sinusitis, unspecified: Secondary | ICD-10-CM

## 2018-05-23 ENCOUNTER — Other Ambulatory Visit: Payer: Self-pay | Admitting: Physician Assistant

## 2018-05-23 DIAGNOSIS — Z1231 Encounter for screening mammogram for malignant neoplasm of breast: Secondary | ICD-10-CM

## 2018-05-31 ENCOUNTER — Other Ambulatory Visit: Payer: Self-pay | Admitting: Physician Assistant

## 2018-05-31 DIAGNOSIS — M9901 Segmental and somatic dysfunction of cervical region: Secondary | ICD-10-CM

## 2018-05-31 DIAGNOSIS — N3001 Acute cystitis with hematuria: Secondary | ICD-10-CM

## 2018-06-06 DIAGNOSIS — H5201 Hypermetropia, right eye: Secondary | ICD-10-CM | POA: Diagnosis not present

## 2018-06-06 DIAGNOSIS — H52203 Unspecified astigmatism, bilateral: Secondary | ICD-10-CM | POA: Diagnosis not present

## 2018-06-06 DIAGNOSIS — H5212 Myopia, left eye: Secondary | ICD-10-CM | POA: Diagnosis not present

## 2018-06-06 DIAGNOSIS — H2513 Age-related nuclear cataract, bilateral: Secondary | ICD-10-CM | POA: Diagnosis not present

## 2018-06-07 ENCOUNTER — Other Ambulatory Visit: Payer: Self-pay | Admitting: Physician Assistant

## 2018-06-07 DIAGNOSIS — M9901 Segmental and somatic dysfunction of cervical region: Secondary | ICD-10-CM

## 2018-06-07 DIAGNOSIS — N3001 Acute cystitis with hematuria: Secondary | ICD-10-CM

## 2018-06-09 DIAGNOSIS — R69 Illness, unspecified: Secondary | ICD-10-CM | POA: Diagnosis not present

## 2018-06-18 ENCOUNTER — Other Ambulatory Visit: Payer: Self-pay | Admitting: Physician Assistant

## 2018-06-18 DIAGNOSIS — N3001 Acute cystitis with hematuria: Secondary | ICD-10-CM

## 2018-06-18 DIAGNOSIS — M9901 Segmental and somatic dysfunction of cervical region: Secondary | ICD-10-CM

## 2018-06-20 ENCOUNTER — Other Ambulatory Visit: Payer: Self-pay | Admitting: *Deleted

## 2018-06-20 MED ORDER — MECLIZINE HCL 25 MG PO TABS: 25.0000 mg | ORAL_TABLET | Freq: Two times a day (BID) | ORAL | 1 refills | Status: DC | PRN

## 2018-06-20 MED ORDER — HYOSCYAMINE SULFATE ER 0.375 MG PO TB12: 0.3750 mg | ORAL_TABLET | Freq: Two times a day (BID) | ORAL | 2 refills | Status: DC

## 2018-06-20 NOTE — Telephone Encounter (Signed)
Last seen 04/08/18  Sara [name]

## 2018-06-20 NOTE — Telephone Encounter (Signed)
Last seen 04/08/18 with Particia Nearing, no future appt scheduled.

## 2018-07-03 ENCOUNTER — Ambulatory Visit: Payer: Medicare HMO

## 2018-07-10 ENCOUNTER — Other Ambulatory Visit: Payer: Self-pay | Admitting: Physician Assistant

## 2018-07-10 ENCOUNTER — Ambulatory Visit
Admission: RE | Admit: 2018-07-10 | Discharge: 2018-07-10 | Disposition: A | Discharge status: Home / Self Care | Payer: Medicare HMO | Source: Ambulatory Visit | Attending: Physician Assistant | Admitting: Physician Assistant

## 2018-07-10 DIAGNOSIS — Z1231 Encounter for screening mammogram for malignant neoplasm of breast: Secondary | ICD-10-CM | POA: Diagnosis not present

## 2018-07-30 ENCOUNTER — Other Ambulatory Visit: Payer: Self-pay | Admitting: *Deleted

## 2018-07-30 MED ORDER — LOSARTAN POTASSIUM 25 MG PO TABS: 25.0000 mg | ORAL_TABLET | Freq: Two times a day (BID) | ORAL | 1 refills | Status: DC

## 2018-07-30 NOTE — Progress Notes (Signed)
[facility] called and stated losartan 50mg  is on backordered and needed [place] to change to losartan 25mg  BID. Rx sent to pharmacy.

## 2018-08-07 ENCOUNTER — Encounter: Payer: Self-pay | Admitting: Pediatrics

## 2018-08-07 ENCOUNTER — Telehealth: Payer: Self-pay | Admitting: Physician Assistant

## 2018-08-07 ENCOUNTER — Ambulatory Visit (INDEPENDENT_AMBULATORY_CARE_PROVIDER_SITE_OTHER): Payer: Medicare HMO | Admitting: Pediatrics

## 2018-08-07 VITALS — BP 138/76 | HR 70 | Temp 97.2°F | Ht 64.0 in | Wt 261.0 lb

## 2018-08-07 DIAGNOSIS — R6889 Other general symptoms and signs: Secondary | ICD-10-CM

## 2018-08-07 DIAGNOSIS — H65193 Other acute nonsuppurative otitis media, bilateral: Secondary | ICD-10-CM

## 2018-08-07 DIAGNOSIS — H65113 Acute and subacute allergic otitis media (mucoid) (sanguinous) (serous), bilateral: Secondary | ICD-10-CM | POA: Diagnosis not present

## 2018-08-07 LAB — VERITOR FLU A/B WAIVED
Influenza A: NEGATIVE
Influenza B: NEGATIVE

## 2018-08-07 MED ORDER — AZITHROMYCIN 250 MG PO TABS: ORAL_TABLET | ORAL | 0 refills | Status: DC

## 2018-08-07 NOTE — Progress Notes (Signed)
  Subjective:   Patient ID: Jaysie Benthall, female    DOB: 09-Oct-1945, 73 y.o.   MRN: [id number] CC: Cough  HPI: Sara [name] is a 73 y.o. female   Symptoms started 2 weeks ago.  Started with a cough.  Now having some sinus congestion, cough mostly dry.  Some sore throat off and on.  Appetite is been okay.  No fevers that she knows of.  Relevant past medical, surgical, family and social history reviewed. Allergies and medications reviewed and updated. Social History   Tobacco Use  Smoking Status Never Smoker  Smokeless Tobacco Never Used   ROS: Per HPI   Objective:    BP 138/76   Pulse 70   Temp (!) 97.2 F (36.2 C) (Oral)   Ht 5\' 4"  (1.626 m)   Wt 261 lb (118.4 kg)   SpO2 95%   BMI 44.80 kg/m   Wt Readings from Last 3 Encounters:  08/07/18 261 lb (118.4 kg)  04/08/18 256 lb 3.2 oz (116.2 kg)  12/24/17 255 lb 9.6 oz (115.9 kg)    Gen: NAD, alert, cooperative with exam, NCAT EYES: EOMI, no conjunctival injection, or no icterus ENT:  TMs red, bulging bilaterally with white-yellow effusions.  OP without erythema LYMPH: no cervical LAD CV: NRRR, normal S1/S2, no murmur, distal pulses 2+ b/l Resp: CTABL, no wheezes, normal WOB Ext: No edema, warm Neuro: Alert and oriented MSK: normal muscle bulk  Assessment & Plan:  [place] was seen today for cough.  Diagnoses and all orders for this visit:  Acute mucoid otitis media of both ears Symptom care, return precautions discussed.  Start below. -     azithromycin (ZITHROMAX) 250 MG tablet; Take 2 the first day and then one each day after.  Flu-like symptoms -     Veritor Flu A/B Waived   Follow up plan: Return if symptoms worsen or fail to improve. Assunta Found, MD [place]

## 2018-08-07 NOTE — Telephone Encounter (Signed)
Scheduled

## 2018-08-12 DIAGNOSIS — H2512 Age-related nuclear cataract, left eye: Secondary | ICD-10-CM | POA: Diagnosis not present

## 2018-08-12 DIAGNOSIS — H25812 Combined forms of age-related cataract, left eye: Secondary | ICD-10-CM | POA: Diagnosis not present

## 2018-08-26 NOTE — Progress Notes (Signed)
Cardiology Office Note   Date:  08/27/2018   ID:  Jakala, Herford Apr 01, 1946, MRN [id number]  PCP:  Terald Sleeper, PA-C  Cardiologist:   Minus Breeding, MD  Referring:  Terald Sleeper, PA-C   Chief Complaint  Patient presents with  . Shortness of Breath     History of Present Illness: Sara [name] is a 73 y.o. female who presents for follow up of CAD and AVNRT.  She had PCI to the LAD in 10/10.  In 12/12, she was admitted to [facility] with SVT and associated chest pain and elevated troponin.  Cath in 12/12 showed patent LAD stents. She had a right and a left heart cath in 2015 for dyspnea.  She had patent arteries.  She had normal right heart pressures.    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  She had Adenosine Cardiolite in 08/09/16  with no evidence for ischemia or infarction.     Since I last saw her her son died.  She is raising her 90 year old granddaughter.  She has increasing dyspnea but this is chronic since her previous work-up as above.  She thinks it is mildly progressive but her weight is gone up.  She still goes to the [facility] and does some exercising.  She is not describing any acute changes in her dyspnea.  She is not having any PND or orthopnea.  Is not having any palpitations, presyncope or syncope.  She denies any chest pressure, neck or arm discomfort.   Past Medical History:  Diagnosis Date  . Arthritis    Arthritis -hands -Bil. knee replacemnts  . AVNRT (AV nodal re-entry tachycardia) ([place])    s/p RFCA 09/2011  . Bronchitis    none recent  . CAD (coronary artery disease)    a. s/p Xience DES x 3 to LAD;  b. nuc study 02/27/10: EF 67% no ischemia;  c.  echo 11/12: Mild LVH, EF 08-65%, grade 1 diastolic dysfunction, moderate LAE;  d.  LHC 07/03/11: LAD stents patent, RCA 25%, EF 55-65% ;  e. Adeno. Myoview 5/14:  No ischemia, EF 68%  . Diverticular disease   . GERD (gastroesophageal reflux disease)   . Hemorrhoid   . Hepatitis    yellow jaundice as a  child   . HTN (hypertension)   . Hx of echocardiogram    a.  Echo (05/2011):  Mild LVH, EF 55-60%, Gr 1 DD, mod LAE.b. Echo (11/14):  Mild LVH, mild focal basal septal hypertrophy, EF 55-60%, Gr 1 DD, mild LAE, atrial septal aneurysm  . Hyperlipidemia   . Hypertension   . Impaired hearing    bilateral- no hearing aids  . Jaundice   . Myocardial infarction (HCC)    hx of x 2   . Obesity   . OSA (obstructive sleep apnea)    needs no cpap per patient   . Other psoriasis    ongoing- none at present.  . Vertigo     Past Surgical History:  Procedure Laterality Date  . ABDOMINAL HYSTERECTOMY    . BACK SURGERY     Dr. Lawernce Pitts  . CARDIAC CATHETERIZATION  08/23/2011   Ablation AV  Node  . COLONOSCOPY    . CORONARY ANGIOPLASTY     with stents 2009   . KNEE SURGERY     right  . LEFT AND RIGHT HEART CATHETERIZATION WITH CORONARY ANGIOGRAM N/A 08/18/2013   Procedure: LEFT AND RIGHT HEART CATHETERIZATION WITH CORONARY ANGIOGRAM;  Surgeon:  Larey Dresser, MD;  Location: [facility] CATH LAB;  Service: Cardiovascular;  Laterality: N/A;  . LEFT HEART CATHETERIZATION WITH CORONARY ANGIOGRAM N/A 07/03/2011   Procedure: LEFT HEART CATHETERIZATION WITH CORONARY ANGIOGRAM;  Surgeon: Minus Breeding, MD;  Location: [facility] CATH LAB;  Service: Cardiovascular;  Laterality: N/A;  . REPAIR RECTOCELE    . SUPRAVENTRICULAR TACHYCARDIA ABLATION N/A 08/23/2011   Procedure: SUPRAVENTRICULAR TACHYCARDIA ABLATION;  Surgeon: Evans Lance, MD;  Location: [facility] CATH LAB;  Service: Cardiovascular;  Laterality: N/A;  . TOTAL HIP ARTHROPLASTY Right 02/23/2016   Procedure: RIGHT TOTAL HIP ARTHROPLASTY ANTERIOR APPROACH;  Surgeon: Rod Can, MD;  Location: [facility] ORS;  Service: Orthopedics;  Laterality: Right;  . TOTAL KNEE ARTHROPLASTY Left 12/22/2012   Procedure: LEFT TOTAL KNEE ARTHROPLASTY;  Surgeon: Gearlean Alf, MD;  Location: [facility] ORS;  Service: Orthopedics;  Laterality: Left;  . TUBAL LIGATION    . UPPER GASTROINTESTINAL  ENDOSCOPY       Current Outpatient Medications  Medication Sig Dispense Refill  . ALPRAZolam (XANAX) 0.5 MG tablet TAKE 1 TABLET BY MOUTH TWICE A DAY AS NEEDED FOR ANXIETY 60 tablet 0  . aspirin EC 81 MG tablet Take 81 mg by mouth daily.    Marland Kitchen atorvastatin (LIPITOR) 80 MG tablet Take 1 tablet (80 mg total) by mouth daily. (Patient taking differently: Take 80 mg by mouth every other day. ) 90 tablet 3  . Calcium Carbonate-Vitamin D (SUPER CALCIUM 600 + D3 PO) Take 1 tablet by mouth daily.    . cyclobenzaprine (FLEXERIL) 5 MG tablet TAKE 1 TABLET BY MOUTH THREE TIMES A DAY AS NEEDED FOR MUSCLE SPASMS 30 tablet 0  . diltiazem (CARDIZEM CD) 120 MG 24 hr capsule TAKE (1) CAPSULE DAILY 90 capsule 3  . fluticasone (FLONASE) 50 MCG/ACT nasal spray SPRAY 1 SPRAY INTO EACH NOSTRIL TWICE A DAY 48 g 0  . furosemide (LASIX) 20 MG tablet Take 1 tablet (20 mg total) by mouth daily. 90 tablet 3  . hydrocortisone 2.5 % ointment Apply topically 2 (two) times daily. 30 g 5  . hyoscyamine (LEVBID) 0.375 MG 12 hr tablet Take 1 tablet (0.375 mg total) by mouth 2 (two) times daily. 60 tablet 2  . losartan (COZAAR) 25 MG tablet Take 1 tablet (25 mg total) by mouth 2 (two) times daily. 180 tablet 1  . meclizine (ANTIVERT) 25 MG tablet Take 1 tablet (25 mg total) by mouth 2 (two) times daily as needed for dizziness. Reported on 12/05/2015 30 tablet 1  . naproxen (EC NAPROSYN) 500 MG EC tablet Take 1 tablet (500 mg total) by mouth 2 (two) times daily with a meal. 20 tablet 0  . nitroGLYCERIN (NITROSTAT) 0.4 MG SL tablet Place 1 tablet (0.4 mg total) under the tongue every 5 (five) minutes as needed for chest pain (x 3 doses). Reported on 12/05/2015 25 tablet 1  . omeprazole (PRILOSEC) 20 MG capsule Take 1 capsule (20 mg total) by mouth daily. 90 capsule 3  . potassium chloride (KLOR-CON 10) 10 MEQ tablet Take 1 tablet (10 mEq total) by mouth daily. 90 tablet 3  . spironolactone (ALDACTONE) 25 MG tablet Take 0.5 tablets  (12.5 mg total) by mouth 2 (two) times daily. 90 tablet 3   No current facility-administered medications for this visit.     Allergies:   Nifedipine; Tape; Codeine; and Crestor [rosuvastatin]    ROS:  Please see the history of present illness.   Otherwise, review of systems are positive for none.  All other systems are reviewed and negative.    PHYSICAL EXAM: VS:  BP (!) 150/78   Pulse 91   Ht 5\' 4"  (1.626 m)   Wt 262 lb (118.8 kg)   BMI 44.97 kg/m  , BMI Body mass index is 44.97 kg/m.  GENERAL:  Well appearing NECK:  No jugular venous distention, waveform within normal limits, carotid upstroke brisk and symmetric, no bruits, no thyromegaly LUNGS:  Clear to auscultation bilaterally CHEST:  Unremarkable HEART:  PMI not displaced or sustained,S1 and S2 within normal limits, no S3, no S4, no clicks, no rubs, no murmurs ABD:  Flat, positive bowel sounds normal in frequency in pitch, no bruits, no rebound, no guarding, no midline pulsatile mass, no hepatomegaly, no splenomegaly EXT:  2 plus pulses throughout, no edema, no cyanosis no clubbing   EKG:  EKG is ordered today. Sinus rhythm, rate 91, left axis deviation, interventricular conduction delay.  No change from previous.  Recent Labs: 04/08/2018: ALT 15; BUN 13; Creatinine, Ser 0.72; Hemoglobin 14.1; Platelets 181; Potassium 4.1; Sodium 142; TSH 2.440    Lipid Panel    Component Value Date/Time   CHOL 162 04/08/2018 1429   TRIG 77 04/08/2018 1429   HDL 54 04/08/2018 1429   CHOLHDL 3.0 04/08/2018 1429   CHOLHDL 3.0 07/11/2016 1507   VLDL 13 07/11/2016 1507   LDLCALC 93 04/08/2018 1429      Wt Readings from Last 3 Encounters:  08/27/18 262 lb (118.8 kg)  08/07/18 261 lb (118.4 kg)  04/08/18 256 lb 3.2 oz (116.2 kg)      Other studies Reviewed: Additional studies/ records that were reviewed today include: Labs    Review of the above records demonstrates:  See below   ASSESSMENT AND PLAN:   CAD:    She has no  new symptoms of obstructive coronary disease.  She needs aggressive risk reduction.  AVNRT: She is had no symptomatic recurrence of arrhythmia.  No change in therapy.  OBESITY:    She eats to rice crispy treats for breakfast every morning.  I asked her to stop that.  We talked about specifics for diet and exercise.  DYSPNEA:   I think this is probably multifactorial.  I do not think further work-up is indicated.  I think this is probably related to weight and deconditioning in large part.  LBBB:  This is chronic.    DYSLIPIDEMIA: Her LDL is mildly elevated at 93 with an HDL of 54.  However, if she gets back on a better diet and we talked about specifics I think she will be down to target where she has been before.  HTN: This is unusual.  Her blood pressure is usually well controlled and I reviewed previous readings.  No change in therapy.  Again I suggest weight loss.  Current medicines are reviewed at length with the patient today.  The patient does not have concerns regarding medicines.  The following changes have been made:    None  Labs/ tests ordered today include:   None  Orders Placed This Encounter  Procedures  . EKG 12-Lead     Disposition:   FU with me in 12 months.     Signed, Minus Breeding, MD  08/27/2018 3:49 PM    [place]

## 2018-08-27 ENCOUNTER — Encounter: Payer: Self-pay | Admitting: Cardiology

## 2018-08-27 ENCOUNTER — Ambulatory Visit: Payer: Medicare HMO | Admitting: Cardiology

## 2018-08-27 VITALS — BP 150/78 | HR 91 | Ht 64.0 in | Wt 262.0 lb

## 2018-08-27 DIAGNOSIS — E785 Hyperlipidemia, unspecified: Secondary | ICD-10-CM

## 2018-08-27 DIAGNOSIS — I2510 Atherosclerotic heart disease of native coronary artery without angina pectoris: Secondary | ICD-10-CM | POA: Diagnosis not present

## 2018-08-27 DIAGNOSIS — R0600 Dyspnea, unspecified: Secondary | ICD-10-CM

## 2018-08-27 DIAGNOSIS — I10 Essential (primary) hypertension: Secondary | ICD-10-CM

## 2018-08-27 DIAGNOSIS — E1169 Type 2 diabetes mellitus with other specified complication: Secondary | ICD-10-CM | POA: Insufficient documentation

## 2018-08-27 NOTE — Patient Instructions (Signed)
Medication Instructions:  The current medical regimen is effective;  continue present plan and medications.  If you need a refill on your cardiac medications before your next appointment, please call your pharmacy.   Follow-Up: Follow up in 1 year with Dr. Hochrein.  You will receive a letter in the mail 2 months before you are due.  Please call us when you receive this letter to schedule your follow up appointment.  Thank you for choosing [facility]!!     

## 2018-09-01 DIAGNOSIS — D2310 Other benign neoplasm of skin of unspecified eyelid, including canthus: Secondary | ICD-10-CM | POA: Diagnosis not present

## 2018-09-01 DIAGNOSIS — D23111 Other benign neoplasm of skin of right upper eyelid, including canthus: Secondary | ICD-10-CM | POA: Diagnosis not present

## 2018-11-21 ENCOUNTER — Encounter: Payer: Self-pay | Admitting: Nurse Practitioner

## 2018-11-21 ENCOUNTER — Ambulatory Visit (INDEPENDENT_AMBULATORY_CARE_PROVIDER_SITE_OTHER): Payer: Medicare HMO | Admitting: Nurse Practitioner

## 2018-11-21 ENCOUNTER — Other Ambulatory Visit: Payer: Self-pay

## 2018-11-21 DIAGNOSIS — J0100 Acute maxillary sinusitis, unspecified: Secondary | ICD-10-CM | POA: Diagnosis not present

## 2018-11-21 MED ORDER — AMOXICILLIN-POT CLAVULANATE 875-125 MG PO TABS: 1.0000 | ORAL_TABLET | Freq: Two times a day (BID) | ORAL | 0 refills | Status: DC

## 2018-11-21 NOTE — Progress Notes (Signed)
Virtual Visit via telephone Note  I connected with Sara [name] on 11/21/18 at 11:50 AM by telephone and verified that I am speaking with the correct person using two identifiers. Sara [name] is currently located at home and no one is currently with her during visit. The provider, Mary-Margaret Hassell Done, FNP is located in their office at time of visit.  I discussed the limitations, risks, security and privacy concerns of performing an evaluation and management service by telephone and the availability of in person appointments. I also discussed with the patient that there may be a patient responsible charge related to this service. The patient expressed understanding and agreed to proceed.   History and Present Illness:   Chief Complaint: Nasal Congestion   HPI Patient calls in today c/o feeling like she has fluid in her ear and has slight cough. Denies fever, no facial pain.  Started 1 week ago and has gotten worse today.    Review of Systems  Constitutional: Negative for chills and fever.  HENT: Positive for congestion, ear pain and sinus pain. Negative for ear discharge and sore throat.   Eyes: Negative.   Respiratory: Positive for cough (slight productive) and sputum production.   Cardiovascular: Negative.   Genitourinary: Negative.   Skin: Negative.   Neurological: Negative.   Psychiatric/Behavioral: Negative.   All other systems reviewed and are negative.    Observations/Objective: Alert and oriented- answers all questions appropriately No distress Voice hoarse  Assessment and Plan: Sara [name] in today with chief complaint of Nasal Congestion   1. Acute non-recurrent maxillary sinusitis 1. Take meds as prescribed 2. Use a cool mist humidifier especially during the winter months and when heat has been humid. 3. Use saline nose sprays frequently 4. Saline irrigations of the nose can be very helpful if done frequently.  * 4X daily for 1 week*   * Use of a nettie pot can be helpful with this. Follow directions with this* 5. Drink plenty of fluids 6. Keep thermostat turn down low 7.For any cough or congestion  Use  Mucinex D- regular strength or max strength is fine   * Children- consult with Pharmacist for dosing 8. For fever or aces or pains- take tylenol or ibuprofen appropriate for age and weight.  * for fevers greater than 101 orally you may alternate ibuprofen and tylenol every  3 hours.   Meds ordered this encounter  Medications  . amoxicillin-clavulanate (AUGMENTIN) 875-125 MG tablet    Sig: Take 1 tablet by mouth 2 (two) times daily.    Dispense:  14 tablet    Refill:  0    Order Specific Question:   Supervising Provider    Answer:   Caryl Pina A [[id number]]       Follow Up Instructions: prn    I discussed the assessment and treatment plan with the patient. The patient was provided an opportunity to ask questions and all were answered. The patient agreed with the plan and demonstrated an understanding of the instructions.   The patient was advised to call back or seek an in-person evaluation if the symptoms worsen or if the condition fails to improve as anticipated.  The above assessment and management plan was discussed with the patient. The patient verbalized understanding of and has agreed to the management plan. Patient is aware to call the clinic if symptoms persist or worsen. Patient is aware when to return to the clinic for a follow-up visit. Patient educated on when  it is appropriate to go to the emergency department.   Time call ended:  12:00  I provided 10 minutes of non-face-to-face time during this encounter.    Mary-Margaret Hassell Done, FNP

## 2018-12-16 ENCOUNTER — Ambulatory Visit (INDEPENDENT_AMBULATORY_CARE_PROVIDER_SITE_OTHER): Payer: Medicare HMO | Admitting: Family Medicine

## 2018-12-16 ENCOUNTER — Encounter: Payer: Self-pay | Admitting: Family Medicine

## 2018-12-16 ENCOUNTER — Other Ambulatory Visit: Payer: Self-pay

## 2018-12-16 DIAGNOSIS — N760 Acute vaginitis: Secondary | ICD-10-CM | POA: Diagnosis not present

## 2018-12-16 MED ORDER — FLUCONAZOLE 150 MG PO TABS: 150.0000 mg | ORAL_TABLET | Freq: Once | ORAL | 1 refills | Status: AC

## 2018-12-16 NOTE — Progress Notes (Signed)
Virtual Visit via telephone Note Due to COVID-19, visit is conducted virtually and was requested by patient. This visit type was conducted due to national recommendations for restrictions regarding the COVID-19 Pandemic (e.g. social distancing) in an effort to limit this patient's exposure and mitigate transmission in our community. All issues noted in this document were discussed and addressed.  A physical exam was not performed with this format.   I connected with Sara [name] on 12/16/18 at 1415 by telephone and verified that I am speaking with the correct person using two identifiers. Sara [name] is currently located at home and no one is currently with them during visit. The provider, Monia Pouch, FNP is located in their office at time of visit.  I discussed the limitations, risks, security and privacy concerns of performing an evaluation and management service by telephone and the availability of in person appointments. I also discussed with the patient that there may be a patient responsible charge related to this service. The patient expressed understanding and agreed to proceed.  Subjective:  Patient ID: Sara [name], female    DOB: 06-Aug-1945, 73 y.o.   MRN: [id number]  Chief Complaint:  Vaginal Discharge   HPI: Sara [name] is a 73 y.o. female presenting on 12/16/2018 for Vaginal Discharge   Pt reports vaginal irritation, pruritis, burning, and discharge. Pt states this started about 3-4 days ago and it not getting better. She denies fever, chills, abdominal pain, dysuria, urinary frequency, or flank pain. No confusion or weakness. She state the discharge is white. She has recently been on Augmentin for sinusitis.   Vaginal Discharge  The patient's primary symptoms include genital itching and vaginal discharge. The patient's pertinent negatives include no genital lesions, genital odor, genital rash, missed menses, pelvic pain or vaginal bleeding. This  is a new problem. The current episode started in the past 7 days. The problem occurs constantly. The problem has been unchanged. The patient is experiencing no pain. The problem affects both sides. She is not pregnant. Pertinent negatives include no abdominal pain, anorexia, back pain, chills, constipation, diarrhea, discolored urine, dysuria, fever, flank pain, frequency, headaches, hematuria, joint pain, joint swelling, nausea, painful intercourse, rash, sore throat, urgency or vomiting. The vaginal discharge was white. There has been no bleeding. Nothing aggravates the symptoms. She has tried nothing for the symptoms. She is postmenopausal.     Relevant past medical, surgical, family, and social history reviewed and updated as indicated.  Allergies and medications reviewed and updated.   Past Medical History:  Diagnosis Date  . Arthritis    Arthritis -hands -Bil. knee replacemnts  . AVNRT (AV nodal re-entry tachycardia) ([place])    s/p RFCA 09/2011  . Bronchitis    none recent  . CAD (coronary artery disease)    a. s/p Xience DES x 3 to LAD;  b. nuc study 02/27/10: EF 67% no ischemia;  c.  echo 11/12: Mild LVH, EF 99-37%, grade 1 diastolic dysfunction, moderate LAE;  d.  LHC 07/03/11: LAD stents patent, RCA 25%, EF 55-65% ;  e. Adeno. Myoview 5/14:  No ischemia, EF 68%  . Diverticular disease   . GERD (gastroesophageal reflux disease)   . Hemorrhoid   . Hepatitis    yellow jaundice as a child   . HTN (hypertension)   . Hx of echocardiogram    a.  Echo (05/2011):  Mild LVH, EF 55-60%, Gr 1 DD, mod LAE.b. Echo (11/14):  Mild LVH, mild focal basal septal  hypertrophy, EF 55-60%, Gr 1 DD, mild LAE, atrial septal aneurysm  . Hyperlipidemia   . Hypertension   . Impaired hearing    bilateral- no hearing aids  . Jaundice   . Myocardial infarction (HCC)    hx of x 2   . Obesity   . OSA (obstructive sleep apnea)    needs no cpap per patient   . Other psoriasis    ongoing- none at present.  .  Vertigo     Past Surgical History:  Procedure Laterality Date  . ABDOMINAL HYSTERECTOMY    . BACK SURGERY     Dr. Lawernce Pitts  . CARDIAC CATHETERIZATION  08/23/2011   Ablation AV  Node  . COLONOSCOPY    . CORONARY ANGIOPLASTY     with stents 2009   . KNEE SURGERY     right  . LEFT AND RIGHT HEART CATHETERIZATION WITH CORONARY ANGIOGRAM N/A 08/18/2013   Procedure: LEFT AND RIGHT HEART CATHETERIZATION WITH CORONARY ANGIOGRAM;  Surgeon: Larey Dresser, MD;  Location: [facility] CATH LAB;  Service: Cardiovascular;  Laterality: N/A;  . LEFT HEART CATHETERIZATION WITH CORONARY ANGIOGRAM N/A 07/03/2011   Procedure: LEFT HEART CATHETERIZATION WITH CORONARY ANGIOGRAM;  Surgeon: Minus Breeding, MD;  Location: [facility] CATH LAB;  Service: Cardiovascular;  Laterality: N/A;  . REPAIR RECTOCELE    . SUPRAVENTRICULAR TACHYCARDIA ABLATION N/A 08/23/2011   Procedure: SUPRAVENTRICULAR TACHYCARDIA ABLATION;  Surgeon: Evans Lance, MD;  Location: [facility] CATH LAB;  Service: Cardiovascular;  Laterality: N/A;  . TOTAL HIP ARTHROPLASTY Right 02/23/2016   Procedure: RIGHT TOTAL HIP ARTHROPLASTY ANTERIOR APPROACH;  Surgeon: Rod Can, MD;  Location: [facility] ORS;  Service: Orthopedics;  Laterality: Right;  . TOTAL KNEE ARTHROPLASTY Left 12/22/2012   Procedure: LEFT TOTAL KNEE ARTHROPLASTY;  Surgeon: Gearlean Alf, MD;  Location: [facility] ORS;  Service: Orthopedics;  Laterality: Left;  . TUBAL LIGATION    . UPPER GASTROINTESTINAL ENDOSCOPY      Social History   Socioeconomic History  . Marital status: Married    Spouse name: Not on file  . Number of children: 3  . Years of education: Not on file  . Highest education level: Not on file  Occupational History  . Occupation: hairdresser  Social Needs  . Financial resource strain: Not on file  . Food insecurity:    Worry: Not on file    Inability: Not on file  . Transportation needs:    Medical: Not on file    Non-medical: Not on file  Tobacco Use  . Smoking status: Never Smoker   . Smokeless tobacco: Never Used  Substance and Sexual Activity  . Alcohol use: No  . Drug use: No  . Sexual activity: Not Currently    Birth control/protection: Post-menopausal  Lifestyle  . Physical activity:    Days per week: Not on file    Minutes per session: Not on file  . Stress: Not on file  Relationships  . Social connections:    Talks on phone: Not on file    Gets together: Not on file    Attends religious service: Not on file    Active member of club or organization: Not on file    Attends meetings of clubs or organizations: Not on file    Relationship status: Not on file  . Intimate partner violence:    Fear of current or ex partner: Not on file    Emotionally abused: Not on file    Physically abused: Not on file  Forced sexual activity: Not on file  Other Topics Concern  . Not on file  Social History Narrative  . Not on file    Outpatient Encounter Medications as of 12/16/2018  Medication Sig  . ALPRAZolam (XANAX) 0.5 MG tablet TAKE 1 TABLET BY MOUTH TWICE A DAY AS NEEDED FOR ANXIETY  . amoxicillin-clavulanate (AUGMENTIN) 875-125 MG tablet Take 1 tablet by mouth 2 (two) times daily.  Marland Kitchen aspirin EC 81 MG tablet Take 81 mg by mouth daily.  Marland Kitchen atorvastatin (LIPITOR) 80 MG tablet Take 1 tablet (80 mg total) by mouth daily. (Patient taking differently: Take 80 mg by mouth every other day. )  . Calcium Carbonate-Vitamin D (SUPER CALCIUM 600 + D3 PO) Take 1 tablet by mouth daily.  . cyclobenzaprine (FLEXERIL) 5 MG tablet TAKE 1 TABLET BY MOUTH THREE TIMES A DAY AS NEEDED FOR MUSCLE SPASMS  . diltiazem (CARDIZEM CD) 120 MG 24 hr capsule TAKE (1) CAPSULE DAILY  . fluconazole (DIFLUCAN) 150 MG tablet Take 1 tablet (150 mg total) by mouth once for 1 dose.  . fluticasone (FLONASE) 50 MCG/ACT nasal spray SPRAY 1 SPRAY INTO EACH NOSTRIL TWICE A DAY  . furosemide (LASIX) 20 MG tablet Take 1 tablet (20 mg total) by mouth daily.  . hydrocortisone 2.5 % ointment Apply topically 2  (two) times daily.  . hyoscyamine (LEVBID) 0.375 MG 12 hr tablet Take 1 tablet (0.375 mg total) by mouth 2 (two) times daily.  Marland Kitchen losartan (COZAAR) 25 MG tablet Take 1 tablet (25 mg total) by mouth 2 (two) times daily.  . meclizine (ANTIVERT) 25 MG tablet Take 1 tablet (25 mg total) by mouth 2 (two) times daily as needed for dizziness. Reported on 12/05/2015  . naproxen (EC NAPROSYN) 500 MG EC tablet Take 1 tablet (500 mg total) by mouth 2 (two) times daily with a meal.  . nitroGLYCERIN (NITROSTAT) 0.4 MG SL tablet Place 1 tablet (0.4 mg total) under the tongue every 5 (five) minutes as needed for chest pain (x 3 doses). Reported on 12/05/2015  . omeprazole (PRILOSEC) 20 MG capsule Take 1 capsule (20 mg total) by mouth daily.  . potassium chloride (KLOR-CON 10) 10 MEQ tablet Take 1 tablet (10 mEq total) by mouth daily.  Marland Kitchen spironolactone (ALDACTONE) 25 MG tablet Take 0.5 tablets (12.5 mg total) by mouth 2 (two) times daily.   No facility-administered encounter medications on file as of 12/16/2018.     Allergies  Allergen Reactions  . Nifedipine Other (See Comments)    Brings blood pressure up really fast PROCARDIA  . Tape     bleeding  . Codeine Itching  . Crestor [Rosuvastatin] Other (See Comments)    myalgias    Review of Systems  Constitutional: Negative for chills, fatigue and fever.  HENT: Negative for sore throat.   Respiratory: Negative for cough and shortness of breath.   Cardiovascular: Negative for chest pain, palpitations and leg swelling.  Gastrointestinal: Negative for abdominal pain, anorexia, constipation, diarrhea, nausea and vomiting.  Genitourinary: Positive for vaginal discharge. Negative for decreased urine volume, difficulty urinating, dyspareunia, dysuria, enuresis, flank pain, frequency, genital sores, hematuria, menstrual problem, missed menses, pelvic pain, urgency, vaginal bleeding and vaginal pain.  Musculoskeletal: Negative for back pain and joint pain.  Skin:  Negative for rash.  Neurological: Negative for weakness and headaches.  Psychiatric/Behavioral: Negative for confusion.  All other systems reviewed and are negative.        Observations/Objective: No vital signs or physical exam, this was  a telephone or virtual health encounter.  Pt alert and oriented, answers all questions appropriately, and able to speak in full sentences.    Assessment and Plan: Sara [name] was seen today for vaginal discharge.  Diagnoses and all orders for this visit:  Vulvovaginitis Due to recent antibiotic use and reported symptoms this is likely vaginal candidiasis. Will treat with oral Diflucan. Report any new or worsening symptoms. Medications as prescribed.  -     fluconazole (DIFLUCAN) 150 MG tablet; Take 1 tablet (150 mg total) by mouth once for 1 dose.     Follow Up Instructions: Return if symptoms worsen or fail to improve.    I discussed the assessment and treatment plan with the patient. The patient was provided an opportunity to ask questions and all were answered. The patient agreed with the plan and demonstrated an understanding of the instructions.   The patient was advised to call back or seek an in-person evaluation if the symptoms worsen or if the condition fails to improve as anticipated.  The above assessment and management plan was discussed with the patient. The patient verbalized understanding of and has agreed to the management plan. Patient is aware to call the clinic if symptoms persist or worsen. Patient is aware when to return to the clinic for a follow-up visit. Patient educated on when it is appropriate to go to the emergency department.    I provided 15 minutes of non-face-to-face time during this encounter. The call started at 1415. The call ended at 1425. The other time was used for coordination of care.    Monia Pouch, FNP-C [facility] [address] [place], [place] [address] [phone]

## 2018-12-31 ENCOUNTER — Ambulatory Visit (INDEPENDENT_AMBULATORY_CARE_PROVIDER_SITE_OTHER): Payer: Medicare HMO | Admitting: Family Medicine

## 2018-12-31 ENCOUNTER — Encounter: Payer: Self-pay | Admitting: Family Medicine

## 2018-12-31 ENCOUNTER — Other Ambulatory Visit: Payer: Self-pay

## 2018-12-31 DIAGNOSIS — B001 Herpesviral vesicular dermatitis: Secondary | ICD-10-CM | POA: Diagnosis not present

## 2018-12-31 DIAGNOSIS — R109 Unspecified abdominal pain: Secondary | ICD-10-CM

## 2018-12-31 MED ORDER — HYOSCYAMINE SULFATE ER 0.375 MG PO TB12: 0.3750 mg | ORAL_TABLET | Freq: Two times a day (BID) | ORAL | 2 refills | Status: DC

## 2018-12-31 MED ORDER — VALACYCLOVIR HCL 1 G PO TABS: 2000.0000 mg | ORAL_TABLET | Freq: Every day | ORAL | 0 refills | Status: AC

## 2018-12-31 NOTE — Progress Notes (Signed)
Virtual Visit via telephone Note Due to COVID-19, visit is conducted virtually and was requested by patient. This visit type was conducted due to national recommendations for restrictions regarding the COVID-19 Pandemic (e.g. social distancing) in an effort to limit this patient's exposure and mitigate transmission in our community. All issues noted in this document were discussed and addressed.  A physical exam was not performed with this format.   I connected with Sara [name] on 12/31/18 at 1430 by telephone and verified that I am speaking with the correct [name] using two identifiers. Sara [name] is currently located at home and no one is currently with them during visit. The provider, Monia Pouch, FNP is located in their office at time of visit.  I discussed the limitations, risks, security and privacy concerns of performing an evaluation and management service by telephone and the availability of in [name] appointments. I also discussed with the patient that there may be a patient responsible charge related to this service. The patient expressed understanding and agreed to proceed.  Subjective:  Patient ID: Sara [name], female    DOB: 09-26-1945, 73 y.o.   MRN: [id number]  Chief Complaint:  Mouth Lesions and Abdominal Pain   HPI: Sara [name] is a 73 y.o. female presenting on 12/31/2018 for Mouth Lesions and Abdominal Pain   Pt reports she has a "fever blister" to her lower lip. States this started yesterday and is not getting better. States she has tried topicals without relief of symptoms. No drainage or erythema. No fever, chills, headaches, or weakness.  States she also has IBS and is out of her medications for the stomach cramps. States she has had IBS for a long time. No hematochezia or melena. No hemoptysis, nausea, or vomiting. Just abdominal cramps with bowel movements.     Relevant past medical, surgical, family, and social history reviewed and  updated as indicated.  Allergies and medications reviewed and updated.   Past Medical History:  Diagnosis Date  . Arthritis    Arthritis -hands -Bil. knee replacemnts  . AVNRT (AV nodal re-entry tachycardia) ([place])    s/p RFCA 09/2011  . Bronchitis    none recent  . CAD (coronary artery disease)    a. s/p Xience DES x 3 to LAD;  b. nuc study 02/27/10: EF 67% no ischemia;  c.  echo 11/12: Mild LVH, EF 27-74%, grade 1 diastolic dysfunction, moderate LAE;  d.  LHC 07/03/11: LAD stents patent, RCA 25%, EF 55-65% ;  e. Adeno. Myoview 5/14:  No ischemia, EF 68%  . Diverticular disease   . GERD (gastroesophageal reflux disease)   . Hemorrhoid   . Hepatitis    yellow jaundice as a child   . HTN (hypertension)   . Hx of echocardiogram    a.  Echo (05/2011):  Mild LVH, EF 55-60%, Gr 1 DD, mod LAE.b. Echo (11/14):  Mild LVH, mild focal basal septal hypertrophy, EF 55-60%, Gr 1 DD, mild LAE, atrial septal aneurysm  . Hyperlipidemia   . Hypertension   . Impaired hearing    bilateral- no hearing aids  . Jaundice   . Myocardial infarction (HCC)    hx of x 2   . Obesity   . OSA (obstructive sleep apnea)    needs no cpap per patient   . Other psoriasis    ongoing- none at present.  . Vertigo     Past Surgical History:  Procedure Laterality Date  . ABDOMINAL HYSTERECTOMY    .  BACK SURGERY     Dr. Lawernce Pitts  . CARDIAC CATHETERIZATION  08/23/2011   Ablation AV  Node  . COLONOSCOPY    . CORONARY ANGIOPLASTY     with stents 2009   . KNEE SURGERY     right  . LEFT AND RIGHT HEART CATHETERIZATION WITH CORONARY ANGIOGRAM N/A 08/18/2013   Procedure: LEFT AND RIGHT HEART CATHETERIZATION WITH CORONARY ANGIOGRAM;  Surgeon: Larey Dresser, MD;  Location: [facility] CATH LAB;  Service: Cardiovascular;  Laterality: N/A;  . LEFT HEART CATHETERIZATION WITH CORONARY ANGIOGRAM N/A 07/03/2011   Procedure: LEFT HEART CATHETERIZATION WITH CORONARY ANGIOGRAM;  Surgeon: Minus Breeding, MD;  Location[facility] C[facility] LAB;   Service: Cardiovascular;  Laterality: N/A;  . REPAIR RECTOCELE    . SUPRAVENTRICULAR TACHYCARDIA ABLATION N/A 08/23/2011   Procedure: SUPRAVENTRICULAR TACHYCARDIA ABLATION;  Surgeon: Evans Lance, MD;  Location: [facility] CATH LAB;  Service: Cardiovascular;  Laterality: N/A;  . TOTAL HIP ARTHROPLASTY Right 02/23/2016   Procedure: RIGHT TOTAL HIP ARTHROPLASTY ANTERIOR APPROACH;  Surgeon: Rod Can, MD;  Location: [facility];  Service: Orthopedics;  Laterality: Right;  . TOTAL KNEE ARTHROPLASTY Left 12/22/2012   Procedure: LEFT TOTAL KNEE ARTHROPLASTY;  Surgeon: Gearlean Alf, MD;  Location: [facility] ORS;  Service: Orthopedics;  Laterality: Left;  . TUBAL LIGATION    . UPPER GASTROINTESTINAL ENDOSCOPY      Social History   Socioeconomic History  . Marital status: Married    Spouse name: Not on file  . Number of children: 3  . Years of education: Not on file  . Highest education level: Not on file  Occupational History  . Occupation: hairdresser  Social Needs  . Financial resource strain: Not on file  . Food insecurity:    Worry: Not on file    Inability: Not on file  . Transportation needs:    Medical: Not on file    Non-medical: Not on file  Tobacco Use  . Smoking status: Never Smoker  . Smokeless tobacco: Never Used  Substance and Sexual Activity  . Alcohol use: No  . Drug use: No  . Sexual activity: Not Currently    Birth control/protection: Post-menopausal  Lifestyle  . Physical activity:    Days per week: Not on file    Minutes per session: Not on file  . Stress: Not on file  Relationships  . Social connections:    Talks on phone: Not on file    Gets together: Not on file    Attends religious service: Not on file    Active member of club or organization: Not on file    Attends meetings of clubs or organizations: Not on file    Relationship status: Not on file  . Intimate partner violence:    Fear of current or ex partner: Not on file    Emotionally abused: Not on file     Physically abused: Not on file    Forced sexual activity: Not on file  Other Topics Concern  . Not on file  Social History Narrative  . Not on file    Outpatient Encounter Medications as of 12/31/2018  Medication Sig  . ALPRAZolam (XANAX) 0.5 MG tablet TAKE 1 TABLET BY MOUTH TWICE A DAY AS NEEDED FOR ANXIETY  . amoxicillin-clavulanate (AUGMENTIN) 875-125 MG tablet Take 1 tablet by mouth 2 (two) times daily.  Marland Kitchen aspirin EC 81 MG tablet Take 81 mg by mouth daily.  Marland Kitchen atorvastatin (LIPITOR) 80 MG tablet Take 1 tablet (80 mg total)  by mouth daily. (Patient taking differently: Take 80 mg by mouth every other day. )  . Calcium Carbonate-Vitamin D (SUPER CALCIUM 600 + D3 PO) Take 1 tablet by mouth daily.  . cyclobenzaprine (FLEXERIL) 5 MG tablet TAKE 1 TABLET BY MOUTH THREE TIMES A DAY AS NEEDED FOR MUSCLE SPASMS  . diltiazem (CARDIZEM CD) 120 MG 24 hr capsule TAKE (1) CAPSULE DAILY  . fluticasone (FLONASE) 50 MCG/ACT nasal spray SPRAY 1 SPRAY INTO EACH NOSTRIL TWICE A DAY  . furosemide (LASIX) 20 MG tablet Take 1 tablet (20 mg total) by mouth daily.  . hydrocortisone 2.5 % ointment Apply topically 2 (two) times daily.  . hyoscyamine (LEVBID) 0.375 MG 12 hr tablet Take 1 tablet (0.375 mg total) by mouth 2 (two) times daily.  Marland Kitchen losartan (COZAAR) 25 MG tablet Take 1 tablet (25 mg total) by mouth 2 (two) times daily.  . meclizine (ANTIVERT) 25 MG tablet Take 1 tablet (25 mg total) by mouth 2 (two) times daily as needed for dizziness. Reported on 12/05/2015  . naproxen (EC NAPROSYN) 500 MG EC tablet Take 1 tablet (500 mg total) by mouth 2 (two) times daily with a meal.  . nitroGLYCERIN (NITROSTAT) 0.4 MG SL tablet Place 1 tablet (0.4 mg total) under the tongue every 5 (five) minutes as needed for chest pain (x 3 doses). Reported on 12/05/2015  . omeprazole (PRILOSEC) 20 MG capsule Take 1 capsule (20 mg total) by mouth daily.  . potassium chloride (KLOR-CON 10) 10 MEQ tablet Take 1 tablet (10 mEq total) by  mouth daily.  Marland Kitchen spironolactone (ALDACTONE) 25 MG tablet Take 0.5 tablets (12.5 mg total) by mouth 2 (two) times daily.  . valACYclovir (VALTREX) 1000 MG tablet Take 2 tablets (2,000 mg total) by mouth daily for 2 days.  . [DISCONTINUED] hyoscyamine (LEVBID) 0.375 MG 12 hr tablet Take 1 tablet (0.375 mg total) by mouth 2 (two) times daily.   No facility-administered encounter medications on file as of 12/31/2018.     Allergies  Allergen Reactions  . Nifedipine Other (See Comments)    Brings blood pressure up really fast PROCARDIA  . Tape     bleeding  . Codeine Itching  . Crestor [Rosuvastatin] Other (See Comments)    myalgias    Review of Systems  Constitutional: Negative for activity change, appetite change, chills, diaphoresis, fatigue, fever and unexpected weight change.  HENT: Positive for mouth sores. Negative for congestion and hearing loss.   Eyes: Negative for photophobia and visual disturbance.  Respiratory: Negative for cough, shortness of breath and wheezing.   Cardiovascular: Negative for chest pain, palpitations and leg swelling.  Gastrointestinal: Positive for abdominal pain. Negative for abdominal distention, anal bleeding, blood in stool, nausea and vomiting.  Genitourinary: Negative for decreased urine volume, difficulty urinating, dysuria, flank pain, frequency, genital sores, hematuria and urgency.  Musculoskeletal: Negative for arthralgias, joint swelling and myalgias.  Skin: Negative for color change.  Neurological: Negative for dizziness, weakness, light-headedness and headaches.  Psychiatric/Behavioral: Negative for confusion.  All other systems reviewed and are negative.        Observations/Objective: No vital signs or physical exam, this was a telephone or virtual health encounter.  Pt alert and oriented, answers all questions appropriately, and able to speak in full sentences.    Assessment and Plan: Sara [name] was seen today for mouth lesions and  abdominal pain.  Diagnoses and all orders for this visit:  Cold sore Symptomatic care discussed. Medications as prescribed. Report any new or  worsening symptoms.  -     valACYclovir (VALTREX) 1000 MG tablet; Take 2 tablets (2,000 mg total) by mouth daily for 2 days.  Abdominal cramps Refill needed on Levbid. Refill sent. Report any new or worsening symptoms.  -     hyoscyamine (LEVBID) 0.375 MG 12 hr tablet; Take 1 tablet (0.375 mg total) by mouth 2 (two) times daily.     Follow Up Instructions: Return if symptoms worsen or fail to improve.    I discussed the assessment and treatment plan with the patient. The patient was provided an opportunity to ask questions and all were answered. The patient agreed with the plan and demonstrated an understanding of the instructions.   The patient was advised to call back or seek an in-[name] evaluation if the symptoms worsen or if the condition fails to improve as anticipated.  The above assessment and management plan was discussed with the patient. The patient verbalized understanding of and has agreed to the management plan. Patient is aware to call the clinic if symptoms persist or worsen. Patient is aware when to return to the clinic for a follow-up visit. Patient educated on when it is appropriate to go to the emergency department.    I provided 15 minutes of non-face-to-face time during this encounter. The call started at 1430. The call ended at 1445. The other time was used for coordination of care.    Monia Pouch, FNP-C [facility] [address] [place], [place] [address] [phone]

## 2019-01-12 ENCOUNTER — Other Ambulatory Visit: Payer: Self-pay | Admitting: Physician Assistant

## 2019-01-12 DIAGNOSIS — E782 Mixed hyperlipidemia: Secondary | ICD-10-CM

## 2019-01-12 MED ORDER — ATORVASTATIN CALCIUM 80 MG PO TABS: 80.0000 mg | ORAL_TABLET | Freq: Every day | ORAL | 0 refills | Status: DC

## 2019-01-12 NOTE — Telephone Encounter (Signed)
30 day RF sent. Appt made for 02/03/19

## 2019-01-12 NOTE — Telephone Encounter (Signed)
What is the name of the medication? atorvastatin (LIPITOR) 80 MG tablet  Have you contacted your pharmacy to request a refill? yes  Which pharmacy would you like this sent to? [facility]   Patient notified that their request is being sent to the clinical staff for review and that they should receive a call once it is complete. If they do not receive a call within 24 hours they can check with their pharmacy or our office.

## 2019-01-19 DIAGNOSIS — M79645 Pain in left finger(s): Secondary | ICD-10-CM | POA: Diagnosis not present

## 2019-01-19 DIAGNOSIS — M1812 Unilateral primary osteoarthritis of first carpometacarpal joint, left hand: Secondary | ICD-10-CM | POA: Diagnosis not present

## 2019-01-19 DIAGNOSIS — M79641 Pain in right hand: Secondary | ICD-10-CM | POA: Diagnosis not present

## 2019-01-19 DIAGNOSIS — M1811 Unilateral primary osteoarthritis of first carpometacarpal joint, right hand: Secondary | ICD-10-CM | POA: Diagnosis not present

## 2019-01-28 ENCOUNTER — Telehealth: Payer: Self-pay | Admitting: Physician Assistant

## 2019-01-28 ENCOUNTER — Ambulatory Visit (INDEPENDENT_AMBULATORY_CARE_PROVIDER_SITE_OTHER): Payer: Medicare HMO | Admitting: Physician Assistant

## 2019-01-28 ENCOUNTER — Other Ambulatory Visit: Payer: Self-pay | Admitting: Physician Assistant

## 2019-01-28 ENCOUNTER — Other Ambulatory Visit: Payer: Self-pay

## 2019-01-28 ENCOUNTER — Encounter: Payer: Self-pay | Admitting: Physician Assistant

## 2019-01-28 ENCOUNTER — Ambulatory Visit: Payer: Medicare HMO | Admitting: Physician Assistant

## 2019-01-28 VITALS — BP 151/90 | HR 90 | Temp 99.0°F | Ht 64.0 in | Wt 249.2 lb

## 2019-01-28 DIAGNOSIS — E782 Mixed hyperlipidemia: Secondary | ICD-10-CM

## 2019-01-28 DIAGNOSIS — I2510 Atherosclerotic heart disease of native coronary artery without angina pectoris: Secondary | ICD-10-CM

## 2019-01-28 DIAGNOSIS — N3001 Acute cystitis with hematuria: Secondary | ICD-10-CM

## 2019-01-28 DIAGNOSIS — R300 Dysuria: Secondary | ICD-10-CM

## 2019-01-28 DIAGNOSIS — I10 Essential (primary) hypertension: Secondary | ICD-10-CM

## 2019-01-28 LAB — MICROSCOPIC EXAMINATION: WBC, UA: >30 /hpf — AB (ref 0–5)

## 2019-01-28 LAB — URINALYSIS, COMPLETE
Bilirubin, UA: NEGATIVE
Glucose, UA: NEGATIVE
Nitrite, UA: POSITIVE — AB
Specific Gravity, UA: 1.02 (ref 1.005–1.030)
Urobilinogen, Ur: 1 mg/dL (ref 0.2–1.0)
pH, UA: 5.5 (ref 5.0–7.5)

## 2019-01-28 MED ORDER — SULFAMETHOXAZOLE-TRIMETHOPRIM 800-160 MG PO TABS: 1.0000 | ORAL_TABLET | Freq: Two times a day (BID) | ORAL | 0 refills | Status: DC

## 2019-01-28 NOTE — Progress Notes (Signed)
BP (!) 151/90   Pulse 90   Temp 99 F (37.2 C) (Oral)   Ht 5\' 4"  (1.626 m)   Wt 249 lb 3.2 oz (113 kg)   BMI 42.78 kg/m    Subjective:    Patient ID: Sara [name], female    DOB: 1946-03-16, 73 y.o.   MRN: [id number]  HPI: Lucilia Yanni is a 73 y.o. female presenting on 01/28/2019 for Urinary Tract Infection  This patient has had several days of dysuria, frequency and nocturia. There is also pain over the bladder in the suprapubic region, no back pain. Denies leakage or hematuria.  Denies fever or chills. No pain in flank area.  Patient states that she had cut her perineum with her fingernail when she was wiping with paper in the paper split.  She has had continued irritation at that area.  She denies any pus.  Past Medical History:  Diagnosis Date  . Arthritis    Arthritis -hands -Bil. knee replacemnts  . AVNRT (AV nodal re-entry tachycardia) ([place])    s/p RFCA 09/2011  . Bronchitis    none recent  . CAD (coronary artery disease)    a. s/p Xience DES x 3 to LAD;  b. nuc study 02/27/10: EF 67% no ischemia;  c.  echo 11/12: Mild LVH, EF 95-63%, grade 1 diastolic dysfunction, moderate LAE;  d.  LHC 07/03/11: LAD stents patent, RCA 25%, EF 55-65% ;  e. [place]. Myoview 5/14:  No ischemia, EF 68%  . Diverticular disease   . GERD (gastroesophageal reflux disease)   . Hemorrhoid   . Hepatitis    yellow jaundice as a child   . HTN (hypertension)   . Hx of echocardiogram    a.  Echo (05/2011):  Mild LVH, EF 55-60%, Gr 1 DD, mod LAE.b. Echo (11/14):  Mild LVH, mild focal basal septal hypertrophy, EF 55-60%, Gr 1 DD, mild LAE, atrial septal aneurysm  . Hyperlipidemia   . Hypertension   . Impaired hearing    bilateral- no hearing aids  . Jaundice   . Myocardial infarction (HCC)    hx of x 2   . Obesity   . OSA (obstructive sleep apnea)    needs no cpap per patient   . Other psoriasis    ongoing- none at present.  . Vertigo    Relevant past medical, surgical, family and  social history reviewed and updated as indicated. Interim medical history since our last visit reviewed. Allergies and medications reviewed and updated. DATA REVIEWED: CHART IN EPIC  Family History reviewed for pertinent findings.  Review of Systems  Constitutional: Negative.   HENT: Negative.   Eyes: Negative.   Respiratory: Negative.   Gastrointestinal: Negative.   Genitourinary: Positive for difficulty urinating, dysuria, frequency and hematuria.    Allergies as of 01/28/2019      Reactions   Nifedipine Other (See Comments)   Brings blood pressure up really fast PROCARDIA   Tape    bleeding   Codeine Itching   Crestor [rosuvastatin] Other (See Comments)   myalgias      Medication List       Accurate as of January 28, 2019 12:01 PM. If you have any questions, ask your nurse or doctor.        STOP taking these medications   amoxicillin-clavulanate 875-125 MG tablet Commonly known as: AUGMENTIN Stopped by: Terald Sleeper, PA-C     TAKE these medications   ALPRAZolam 0.5 MG tablet Commonly  known as: XANAX TAKE 1 TABLET BY MOUTH TWICE A DAY AS NEEDED FOR ANXIETY   aspirin EC 81 MG tablet Take 81 mg by mouth daily.   atorvastatin 80 MG tablet Commonly known as: LIPITOR Take 1 tablet (80 mg total) by mouth daily. (Needs to be seen before next refill)   cyclobenzaprine 5 MG tablet Commonly known as: FLEXERIL TAKE 1 TABLET BY MOUTH THREE TIMES A DAY AS NEEDED FOR MUSCLE SPASMS   diltiazem 120 MG 24 hr capsule Commonly known as: CARDIZEM CD TAKE (1) CAPSULE DAILY   fluticasone 50 MCG/ACT nasal spray Commonly known as: FLONASE SPRAY 1 SPRAY INTO EACH NOSTRIL TWICE A DAY   furosemide 20 MG tablet Commonly known as: LASIX Take 1 tablet (20 mg total) by mouth daily.   hydrocortisone 2.5 % ointment Apply topically 2 (two) times daily.   hyoscyamine 0.375 MG 12 hr tablet Commonly known as: Levbid Take 1 tablet (0.375 mg total) by mouth 2 (two) times daily.    losartan 25 MG tablet Commonly known as: COZAAR Take 1 tablet (25 mg total) by mouth 2 (two) times daily.   meclizine 25 MG tablet Commonly known as: ANTIVERT Take 1 tablet (25 mg total) by mouth 2 (two) times daily as needed for dizziness. Reported on 12/05/2015   naproxen 500 MG EC tablet Commonly known as: EC NAPROSYN Take 1 tablet (500 mg total) by mouth 2 (two) times daily with a meal.   nitroGLYCERIN 0.4 MG SL tablet Commonly known as: NITROSTAT Place 1 tablet (0.4 mg total) under the tongue every 5 (five) minutes as needed for chest pain (x 3 doses). Reported on 12/05/2015   omeprazole 20 MG capsule Commonly known as: PRILOSEC Take 1 capsule (20 mg total) by mouth daily.   potassium chloride 10 MEQ tablet Commonly known as: Klor-Con 10 Take 1 tablet (10 mEq total) by mouth daily.   spironolactone 25 MG tablet Commonly known as: ALDACTONE Take 0.5 tablets (12.5 mg total) by mouth 2 (two) times daily.   sulfamethoxazole-trimethoprim 800-160 MG tablet Commonly known as: Bactrim DS Take 1 tablet by mouth 2 (two) times daily. Started by: Terald Sleeper, PA-C   SUPER CALCIUM 600 + D3 PO Take 1 tablet by mouth daily.          Objective:    BP (!) 151/90   Pulse 90   Temp 99 F (37.2 C) (Oral)   Ht 5\' 4"  (1.626 m)   Wt 249 lb 3.2 oz (113 kg)   BMI 42.78 kg/m   Allergies  Allergen Reactions  . Nifedipine Other (See Comments)    Brings blood pressure up really fast PROCARDIA  . Tape     bleeding  . Codeine Itching  . Crestor [Rosuvastatin] Other (See Comments)    myalgias    Wt Readings from Last 3 Encounters:  01/28/19 249 lb 3.2 oz (113 kg)  08/27/18 262 lb (118.8 kg)  08/07/18 261 lb (118.4 kg)    Physical Exam Constitutional:      Appearance: She is well-developed.  HENT:     Head: Normocephalic and atraumatic.  Eyes:     Conjunctiva/sclera: Conjunctivae normal.     Pupils: Pupils are equal, round, and reactive to light.  Cardiovascular:      Rate and Rhythm: Normal rate and regular rhythm.     Heart sounds: Normal heart sounds.  Pulmonary:     Effort: Pulmonary effort is normal.     Breath sounds: Normal breath sounds.  Abdominal:     General: Bowel sounds are normal. There is no distension.     Palpations: Abdomen is soft. There is no mass.     Tenderness: There is abdominal tenderness in the suprapubic area. There is no guarding or rebound.  Skin:    General: Skin is warm and dry.     Findings: No rash.  Neurological:     Mental Status: She is alert and oriented to person, place, and time.     Deep Tendon Reflexes: Reflexes are normal and symmetric.  Psychiatric:        Behavior: Behavior normal.        Thought Content: Thought content normal.        Judgment: Judgment normal.     Results for orders placed or performed in visit on 08/07/18  Veritor Flu A/B Waived  Result Value Ref Range   Influenza A Negative Negative   Influenza B Negative Negative      Assessment & Plan:   1. Dysuria - Urine Culture - Urinalysis, Complete  2. Acute cystitis with hematuria - sulfamethoxazole-trimethoprim (BACTRIM DS) 800-160 MG tablet; Take 1 tablet by mouth 2 (two) times daily.  Dispense: 20 tablet; Refill: 0   Continue all other maintenance medications as listed above.  Follow up plan: No follow-ups on file.  Educational handout given for uti  Terald Sleeper PA-C [place] [address]  [place], [place] [address] [phone]   01/28/2019, 12:01 PM

## 2019-01-28 NOTE — Patient Instructions (Signed)
Urinary Tract Infection, Adult A urinary tract infection (UTI) is an infection of any part of the urinary tract. The urinary tract includes:  The kidneys.  The ureters.  The bladder.  The urethra. These organs make, store, and get rid of pee (urine) in the body. What are the causes? This is caused by germs (bacteria) in your genital area. These germs grow and cause swelling (inflammation) of your urinary tract. What increases the risk? You are more likely to develop this condition if:  You have a small, thin tube (catheter) to drain pee.  You cannot control when you pee or poop (incontinence).  You are female, and: ? You use these methods to prevent pregnancy: ? A medicine that kills sperm (spermicide). ? A device that blocks sperm (diaphragm). ? You have low levels of a female hormone (estrogen). ? You are pregnant.  You have genes that add to your risk.  You are sexually active.  You take antibiotic medicines.  You have trouble peeing because of: ? A prostate that is bigger than normal, if you are female. ? A blockage in the part of your body that drains pee from the bladder (urethra). ? A kidney stone. ? A nerve condition that affects your bladder (neurogenic bladder). ? Not getting enough to drink. ? Not peeing often enough.  You have other conditions, such as: ? Diabetes. ? A weak disease-fighting system (immune system). ? Sickle cell disease. ? Gout. ? Injury of the spine. What are the signs or symptoms? Symptoms of this condition include:  Needing to pee right away (urgently).  Peeing often.  Peeing small amounts often.  Pain or burning when peeing.  Blood in the pee.  Pee that smells bad or not like normal.  Trouble peeing.  Pee that is cloudy.  Fluid coming from the vagina, if you are female.  Pain in the belly or lower back. Other symptoms include:  Throwing up (vomiting).  No urge to eat.  Feeling mixed up (confused).  Being tired  and grouchy (irritable).  A fever.  Watery poop (diarrhea). How is this treated? This condition may be treated with:  Antibiotic medicine.  Other medicines.  Drinking enough water. Follow these instructions at home:  Medicines  Take over-the-counter and prescription medicines only as told by your doctor.  If you were prescribed an antibiotic medicine, take it as told by your doctor. Do not stop taking it even if you start to feel better. General instructions  Make sure you: ? Pee until your bladder is empty. ? Do not hold pee for a long time. ? Empty your bladder after sex. ? Wipe from front to back after pooping if you are a female. Use each tissue one time when you wipe.  Drink enough fluid to keep your pee pale yellow.  Keep all follow-up visits as told by your doctor. This is important. Contact a doctor if:  You do not get better after 1-2 days.  Your symptoms go away and then come back. Get help right away if:  You have very bad back pain.  You have very bad pain in your lower belly.  You have a fever.  You are sick to your stomach (nauseous).  You are throwing up. Summary  A urinary tract infection (UTI) is an infection of any part of the urinary tract.  This condition is caused by germs in your genital area.  There are many risk factors for a UTI. These include having a small, thin   tube to drain pee and not being able to control when you pee or poop.  Treatment includes antibiotic medicines for germs.  Drink enough fluid to keep your pee pale yellow. This information is not intended to replace advice given to you by your health care provider. Make sure you discuss any questions you have with your health care provider. Document Released: 12/26/2007 Document Revised: 06/26/2018 Document Reviewed: 01/16/2018 Elsevier Patient Education  2020 [employer]  

## 2019-01-28 NOTE — Telephone Encounter (Signed)
ordered

## 2019-01-29 NOTE — Telephone Encounter (Signed)
Patient aware.

## 2019-01-30 ENCOUNTER — Other Ambulatory Visit: Payer: Self-pay | Admitting: Physician Assistant

## 2019-01-30 LAB — URINE CULTURE

## 2019-01-30 MED ORDER — CIPROFLOXACIN HCL 500 MG PO TABS: 500.0000 mg | ORAL_TABLET | Freq: Two times a day (BID) | ORAL | 0 refills | Status: DC

## 2019-02-02 ENCOUNTER — Other Ambulatory Visit: Payer: Medicare HMO

## 2019-02-02 ENCOUNTER — Other Ambulatory Visit: Payer: Self-pay

## 2019-02-02 DIAGNOSIS — E782 Mixed hyperlipidemia: Secondary | ICD-10-CM | POA: Diagnosis not present

## 2019-02-02 DIAGNOSIS — I10 Essential (primary) hypertension: Secondary | ICD-10-CM | POA: Diagnosis not present

## 2019-02-02 DIAGNOSIS — I2510 Atherosclerotic heart disease of native coronary artery without angina pectoris: Secondary | ICD-10-CM

## 2019-02-02 LAB — BAYER DCA HB A1C WAIVED: HB A1C (BAYER DCA - WAIVED): 6.1 % (ref <7.0)

## 2019-02-03 ENCOUNTER — Other Ambulatory Visit: Payer: Self-pay

## 2019-02-03 ENCOUNTER — Ambulatory Visit (INDEPENDENT_AMBULATORY_CARE_PROVIDER_SITE_OTHER): Payer: Medicare HMO | Admitting: Physician Assistant

## 2019-02-03 ENCOUNTER — Encounter: Payer: Self-pay | Admitting: Physician Assistant

## 2019-02-03 DIAGNOSIS — I10 Essential (primary) hypertension: Secondary | ICD-10-CM | POA: Diagnosis not present

## 2019-02-03 DIAGNOSIS — R600 Localized edema: Secondary | ICD-10-CM

## 2019-02-03 DIAGNOSIS — J019 Acute sinusitis, unspecified: Secondary | ICD-10-CM | POA: Diagnosis not present

## 2019-02-03 DIAGNOSIS — E782 Mixed hyperlipidemia: Secondary | ICD-10-CM

## 2019-02-03 LAB — CBC WITH DIFFERENTIAL/PLATELET
Basophils Absolute: 0 10*3/uL (ref 0.0–0.2)
Basos: 1 %
EOS (ABSOLUTE): 0.2 10*3/uL (ref 0.0–0.4)
Eos: 3 %
Hematocrit: 42.1 % (ref 34.0–46.6)
Hemoglobin: 14.6 g/dL (ref 11.1–15.9)
Immature Grans (Abs): 0 10*3/uL (ref 0.0–0.1)
Immature Granulocytes: 0 %
Lymphocytes Absolute: 2.3 10*3/uL (ref 0.7–3.1)
Lymphs: 41 %
MCH: 32.2 pg (ref 26.6–33.0)
MCHC: 34.7 g/dL (ref 31.5–35.7)
MCV: 93 fL (ref 79–97)
Monocytes Absolute: 0.4 10*3/uL (ref 0.1–0.9)
Monocytes: 8 %
Neutrophils Absolute: 2.6 10*3/uL (ref 1.4–7.0)
Neutrophils: 47 %
Platelets: 159 10*3/uL (ref 150–450)
RBC: 4.53 x10E6/uL (ref 3.77–5.28)
RDW: 12.9 % (ref 11.7–15.4)
WBC: 5.6 10*3/uL (ref 3.4–10.8)

## 2019-02-03 LAB — LIPID PANEL
Chol/HDL Ratio: 2.9 ratio (ref 0.0–4.4)
Cholesterol, Total: 151 mg/dL (ref 100–199)
HDL: 52 mg/dL (ref >39)
LDL Calculated: 87 mg/dL (ref 0–99)
Triglycerides: 62 mg/dL (ref 0–149)
VLDL Cholesterol Cal: 12 mg/dL (ref 5–40)

## 2019-02-03 LAB — CMP14+EGFR
ALT: 17 IU/L (ref 0–32)
AST: 25 IU/L (ref 0–40)
Albumin/Globulin Ratio: 1.6 (ref 1.2–2.2)
Albumin: 3.7 g/dL (ref 3.7–4.7)
Alkaline Phosphatase: 74 IU/L (ref 39–117)
BUN/Creatinine Ratio: 15 (ref 12–28)
BUN: 13 mg/dL (ref 8–27)
Bilirubin Total: 1 mg/dL (ref 0.0–1.2)
CO2: 18 mmol/L — ABNORMAL LOW (ref 20–29)
Calcium: 9 mg/dL (ref 8.7–10.3)
Chloride: 109 mmol/L — ABNORMAL HIGH (ref 96–106)
Creatinine, Ser: 0.87 mg/dL (ref 0.57–1.00)
GFR calc Af Amer: 76 mL/min/{1.73_m2} (ref >59)
GFR calc non Af Amer: 66 mL/min/{1.73_m2} (ref >59)
Globulin, Total: 2.3 g/dL (ref 1.5–4.5)
Glucose: 117 mg/dL — ABNORMAL HIGH (ref 65–99)
Potassium: 4.1 mmol/L (ref 3.5–5.2)
Sodium: 140 mmol/L (ref 134–144)
Total Protein: 6 g/dL (ref 6.0–8.5)

## 2019-02-03 LAB — TSH: TSH: 1.91 u[IU]/mL (ref 0.450–4.500)

## 2019-02-03 MED ORDER — DILTIAZEM HCL ER COATED BEADS 120 MG PO CP24: ORAL_CAPSULE | ORAL | 3 refills | Status: DC

## 2019-02-03 MED ORDER — POTASSIUM CHLORIDE ER 10 MEQ PO TBCR: 10.0000 meq | EXTENDED_RELEASE_TABLET | Freq: Every day | ORAL | 3 refills | Status: DC

## 2019-02-03 MED ORDER — OMEPRAZOLE 20 MG PO CPDR: 20.0000 mg | DELAYED_RELEASE_CAPSULE | Freq: Every day | ORAL | 3 refills | Status: DC

## 2019-02-03 MED ORDER — FUROSEMIDE 20 MG PO TABS: 20.0000 mg | ORAL_TABLET | Freq: Every day | ORAL | 3 refills | Status: DC

## 2019-02-03 MED ORDER — LOSARTAN POTASSIUM 25 MG PO TABS: 25.0000 mg | ORAL_TABLET | Freq: Two times a day (BID) | ORAL | 3 refills | Status: DC

## 2019-02-03 MED ORDER — SPIRONOLACTONE 25 MG PO TABS: 12.5000 mg | ORAL_TABLET | Freq: Two times a day (BID) | ORAL | 3 refills | Status: DC

## 2019-02-03 MED ORDER — FLUTICASONE PROPIONATE 50 MCG/ACT NA SUSP: NASAL | 3 refills | Status: DC

## 2019-02-03 MED ORDER — ATORVASTATIN CALCIUM 80 MG PO TABS: 80.0000 mg | ORAL_TABLET | Freq: Every day | ORAL | 3 refills | Status: DC

## 2019-02-03 NOTE — Progress Notes (Signed)
BP 114/70   Pulse 79   Temp (!) 97.1 F (36.2 C) (Oral)   Ht _0  (1.626 m)   Wt 250 lb 3.2 oz (113.5 kg)   BMI 42.95 kg/m    Subjective:    Patient ID: Sara [name], female    DOB: Apr 28, 1946, 73 y.o.   MRN: [id number]  HPI: Sara [name] is a 73 y.o. female presenting on 02/03/2019 for Hypertension, Hyperlipidemia, and Medical Management of Chronic Issues (6 month )  This patient comes in for 81-monthrecheck on her chronic medical conditions that do include hypertension, coronary artery disease, GERD, sciatica, arthritis, hyperlipidemia, mildly elevated glucose without the diagnosis of diabetes.  I reviewed all of her labs with her today.  They look extremely good.  Her cholesterol is well controlled.  Her A1c went down from 6.5-6.1.  I commended her on her efforts.  Please continue to do this.  The remainder of the labs look very good.  And we can plan to recheck things in 6 months.  She will continue to see her specialist.  She is having some more pain in her left thumb, which is badly affected by arthritis.  She [place]o [place]  He does injections to the joint.  They have had a discussion that she might need to have surgery on this joint in the future.   Past Medical History:  Diagnosis Date  . Arthritis    Arthritis -hands -Bil. knee replacemnts  . AVNRT (AV nodal re-entry tachycardia) ([place]    s/p RFCA 09/2011  . Bronchitis    none recent  . CAD (coronary artery disease)    a. s/p Xience DES x 3 to LAD;  b. nuc study 02/27/10: EF 67% no ischemia;  c.  echo 11/12: Mild LVH, EF 576-16% grade 1 diastolic dysfunction, moderate LAE;  d.  LHC 07/03/11: LAD stents patent, RCA 25%, EF 55-65% ;  e. [place]. Myoview 5/14:  No ischemia, EF 68%  . Diverticular disease   . GERD (gastroesophageal reflux disease)   . Hemorrhoid   . Hepatitis    yellow jaundice as a child   . HTN (hypertension)   . Hx of echocardiogram    a.  Echo (05/2011):  Mild LVH, EF  55-60%, Gr 1 DD, mod LAE.b. Echo (11/14):  Mild LVH, mild focal basal septal hypertrophy, EF 55-60%, Gr 1 DD, mild LAE, atrial septal aneurysm  . Hyperlipidemia   . Hypertension   . Impaired hearing    bilateral- no hearing aids  . Jaundice   . Myocardial infarction (HCC)    hx of x 2   . Obesity   . OSA (obstructive sleep apnea)    needs no cpap per patient   . Other psoriasis    ongoing- none at present.  . Vertigo    Relevant past medical, surgical, family and social history reviewed and updated as indicated. Interim medical history since our last visit reviewed. Allergies and medications reviewed and updated. DATA REVIEWED: CHART IN EPIC  Family History reviewed for pertinent findings.  Review of Systems  Constitutional: Negative.  Negative for fatigue and unexpected weight change.  HENT: Negative.   Eyes: Negative.   Respiratory: Negative.  Negative for shortness of breath.   Gastrointestinal: Negative.   Genitourinary: Negative.   Musculoskeletal: Positive for arthralgias and joint swelling.    Allergies as of 02/03/2019      Reactions   Nifedipine Other (See Comments)  Brings blood pressure up really fast PROCARDIA   Tape    bleeding   Codeine Itching   Crestor [rosuvastatin] Other (See Comments)   myalgias      Medication List       Accurate as of February 03, 2019 12:51 PM. If you have any questions, ask your nurse or doctor.        ALPRAZolam 0.5 MG tablet Commonly known as: XANAX TAKE 1 TABLET BY MOUTH TWICE A DAY AS NEEDED FOR ANXIETY   aspirin EC 81 MG tablet Take 81 mg by mouth daily.   atorvastatin 80 MG tablet Commonly known as: LIPITOR Take 1 tablet (80 mg total) by mouth daily. What changed: additional instructions Changed by: Terald Sleeper, PA-C   ciprofloxacin 500 MG tablet Commonly known as: Cipro Take 1 tablet (500 mg total) by mouth 2 (two) times daily.   cyclobenzaprine 5 MG tablet Commonly known as: FLEXERIL TAKE 1 TABLET BY  MOUTH THREE TIMES A DAY AS NEEDED FOR MUSCLE SPASMS   diltiazem 120 MG 24 hr capsule Commonly known as: CARDIZEM CD TAKE (1) CAPSULE DAILY   fluticasone 50 MCG/ACT nasal spray Commonly known as: FLONASE SPRAY 1 SPRAY INTO EACH NOSTRIL TWICE A DAY   furosemide 20 MG tablet Commonly known as: LASIX Take 1 tablet (20 mg total) by mouth daily.   hydrocortisone 2.5 % ointment Apply topically 2 (two) times daily.   hyoscyamine 0.375 MG 12 hr tablet Commonly known as: Levbid Take 1 tablet (0.375 mg total) by mouth 2 (two) times daily.   losartan 25 MG tablet Commonly known as: COZAAR Take 1 tablet (25 mg total) by mouth 2 (two) times daily.   meclizine 25 MG tablet Commonly known as: ANTIVERT Take 1 tablet (25 mg total) by mouth 2 (two) times daily as needed for dizziness. Reported on 12/05/2015   naproxen 500 MG EC tablet Commonly known as: EC NAPROSYN Take 1 tablet (500 mg total) by mouth 2 (two) times daily with a meal.   nitroGLYCERIN 0.4 MG SL tablet Commonly known as: NITROSTAT Place 1 tablet (0.4 mg total) under the tongue every 5 (five) minutes as needed for chest pain (x 3 doses). Reported on 12/05/2015   omeprazole 20 MG capsule Commonly known as: PRILOSEC Take 1 capsule (20 mg total) by mouth daily.   potassium chloride 10 MEQ tablet Commonly known as: Klor-Con 10 Take 1 tablet (10 mEq total) by mouth daily.   spironolactone 25 MG tablet Commonly known as: ALDACTONE Take 0.5 tablets (12.5 mg total) by mouth 2 (two) times daily.   SUPER CALCIUM 600 + D3 PO Take 1 tablet by mouth daily.          Objective:    BP 114/70   Pulse 79   Temp (!) 97.1 F (36.2 C) (Oral)   Ht _0  (1.626 m)   Wt 250 lb 3.2 oz (113.5 kg)   BMI 42.95 kg/m   Allergies  Allergen Reactions  . Nifedipine Other (See Comments)    Brings blood pressure up really fast PROCARDIA  . Tape     bleeding  . Codeine Itching  . Crestor [Rosuvastatin] Other (See Comments)    myalgias     Wt Readings from Last 3 Encounters:  02/03/19 250 lb 3.2 oz (113.5 kg)  01/28/19 249 lb 3.2 oz (113 kg)  08/27/18 262 lb (118.8 kg)    Physical Exam Constitutional:      Appearance: She is well-developed.  HENT:  Head: Normocephalic and atraumatic.  Eyes:     Conjunctiva/sclera: Conjunctivae normal.     Pupils: Pupils are equal, round, and reactive to light.  Cardiovascular:     Rate and Rhythm: Normal rate and regular rhythm.     Heart sounds: Normal heart sounds.  Pulmonary:     Effort: Pulmonary effort is normal.     Breath sounds: Normal breath sounds.  Abdominal:     General: Bowel sounds are normal.     Palpations: Abdomen is soft.  Skin:    General: Skin is warm and dry.     Findings: No rash.  Neurological:     Mental Status: She is alert and oriented to person, place, and time.     Deep Tendon Reflexes: Reflexes are normal and symmetric.  Psychiatric:        Behavior: Behavior normal.        Thought Content: Thought content normal.        Judgment: Judgment normal.     Results for orders placed or performed in visit on 02/02/19  Bayer DCA Hb A1c Waived  Result Value Ref Range   HB A1C (BAYER DCA - WAIVED) 6.1 <7.0 %  TSH  Result Value Ref Range   TSH 1.910 0.450 - 4.500 uIU/mL  Lipid panel  Result Value Ref Range   Cholesterol, Total 151 100 - 199 mg/dL   Triglycerides 62 0 - 149 mg/dL   HDL 52 >39 mg/dL   VLDL Cholesterol Cal 12 5 - 40 mg/dL   LDL Calculated 87 0 - 99 mg/dL   Chol/HDL Ratio 2.9 0.0 - 4.4 ratio  CMP14+EGFR  Result Value Ref Range   Glucose 117 (H) 65 - 99 mg/dL   BUN 13 8 - 27 mg/dL   Creatinine, Ser 0.87 0.57 - 1.00 mg/dL   GFR calc non Af Amer 66 >59 mL/min/1.73   GFR calc Af Amer 76 >59 mL/min/1.73   BUN/Creatinine Ratio 15 12 - 28   Sodium 140 134 - 144 mmol/L   Potassium 4.1 3.5 - 5.2 mmol/L   Chloride 109 (H) 96 - 106 mmol/L   CO2 18 (L) 20 - 29 mmol/L   Calcium 9.0 8.7 - 10.3 mg/dL   Total Protein 6.0 6.0 - 8.5  g/dL   Albumin 3.7 3.7 - 4.7 g/dL   Globulin, Total 2.3 1.5 - 4.5 g/dL   Albumin/Globulin Ratio 1.6 1.2 - 2.2   Bilirubin Total 1.0 0.0 - 1.2 mg/dL   Alkaline Phosphatase 74 39 - 117 IU/L   AST 25 0 - 40 IU/L   ALT 17 0 - 32 IU/L  CBC with Differential/Platelet  Result Value Ref Range   WBC 5.6 3.4 - 10.8 x10E3/uL   RBC 4.53 3.77 - 5.28 x10E6/uL   Hemoglobin 14.6 11.1 - 15.9 g/dL   Hematocrit 42.1 34.0 - 46.6 %   MCV 93 79 - 97 fL   MCH 32.2 26.6 - 33.0 pg   MCHC 34.7 31.5 - 35.7 g/dL   RDW 12.9 11.7 - 15.4 %   Platelets 159 150 - 450 x10E3/uL   Neutrophils 47 Not Estab. %   Lymphs 41 Not Estab. %   Monocytes 8 Not Estab. %   Eos 3 Not Estab. %   Basos 1 Not Estab. %   Neutrophils Absolute 2.6 1.4 - 7.0 x10E3/uL   Lymphocytes Absolute 2.3 0.7 - 3.1 x10E3/uL   Monocytes Absolute 0.4 0.1 - 0.9 x10E3/uL   EOS (ABSOLUTE) 0.2 0.0 -  0.4 x10E3/uL   Basophils Absolute 0.0 0.0 - 0.2 x10E3/uL   Immature Granulocytes 0 Not Estab. %   Immature Grans (Abs) 0.0 0.0 - 0.1 x10E3/uL      Assessment & Plan:   1. Mixed hyperlipidemia - atorvastatin (LIPITOR) 80 MG tablet; Take 1 tablet (80 mg total) by mouth daily.  Dispense: 90 tablet; Refill: 3  2. Essential hypertension - diltiazem (CARDIZEM CD) 120 MG 24 hr capsule; TAKE (1) CAPSULE DAILY  Dispense: 90 capsule; Refill: 3 - furosemide (LASIX) 20 MG tablet; Take 1 tablet (20 mg total) by mouth daily.  Dispense: 90 tablet; Refill: 3  3. Localized edema - furosemide (LASIX) 20 MG tablet; Take 1 tablet (20 mg total) by mouth daily.  Dispense: 90 tablet; Refill: 3 - spironolactone (ALDACTONE) 25 MG tablet; Take 0.5 tablets (12.5 mg total) by mouth 2 (two) times daily.  Dispense: 90 tablet; Refill: 3  4. Acute rhinosinusitis - fluticasone (FLONASE) 50 MCG/ACT nasal spray; SPRAY 1 SPRAY INTO EACH NOSTRIL TWICE A DAY  Dispense: 48 g; Refill: 3   Continue all other maintenance medications as listed above.  Follow up plan: Return in about  6 months (around 08/06/2019).  Educational handout given for [place] PA-C [facility] [facility] [address]  [place], [place] [address] [phone]   02/03/2019, 12:51 PM

## 2019-02-04 ENCOUNTER — Other Ambulatory Visit: Payer: Self-pay | Admitting: Physician Assistant

## 2019-02-04 DIAGNOSIS — E782 Mixed hyperlipidemia: Secondary | ICD-10-CM

## 2019-02-11 DIAGNOSIS — R69 Illness, unspecified: Secondary | ICD-10-CM | POA: Diagnosis not present

## 2019-03-19 DIAGNOSIS — H2511 Age-related nuclear cataract, right eye: Secondary | ICD-10-CM | POA: Diagnosis not present

## 2019-04-07 ENCOUNTER — Encounter: Payer: Self-pay | Admitting: Family Medicine

## 2019-04-07 ENCOUNTER — Other Ambulatory Visit: Payer: Self-pay

## 2019-04-07 ENCOUNTER — Ambulatory Visit (INDEPENDENT_AMBULATORY_CARE_PROVIDER_SITE_OTHER): Payer: Medicare HMO | Admitting: Family Medicine

## 2019-04-07 DIAGNOSIS — B9689 Other specified bacterial agents as the cause of diseases classified elsewhere: Secondary | ICD-10-CM

## 2019-04-07 DIAGNOSIS — J988 Other specified respiratory disorders: Secondary | ICD-10-CM

## 2019-04-07 MED ORDER — AMOXICILLIN-POT CLAVULANATE 875-125 MG PO TABS: 1.0000 | ORAL_TABLET | Freq: Two times a day (BID) | ORAL | 0 refills | Status: DC

## 2019-04-07 MED ORDER — BENZONATATE 100 MG PO CAPS: 100.0000 mg | ORAL_CAPSULE | Freq: Three times a day (TID) | ORAL | 0 refills | Status: DC | PRN

## 2019-04-07 MED ORDER — PREDNISONE 10 MG (21) PO TBPK: ORAL_TABLET | ORAL | 0 refills | Status: DC

## 2019-04-07 NOTE — Progress Notes (Signed)
Virtual Visit via Telephone Note  I connected with Sara [name] on 04/07/19 at 8:46 AM by telephone and verified that I am speaking with the correct person using two identifiers. Sara [name] is currently located at home and nobody is currently with her during this visit. The provider, Loman Brooklyn, FNP is located in their home at time of visit.  I discussed the limitations, risks, security and privacy concerns of performing an evaluation and management service by telephone and the availability of in person appointments. I also discussed with the patient that there may be a patient responsible charge related to this service. The patient expressed understanding and agreed to proceed.  Subjective: PCP: Terald Sleeper, PA-C  Chief Complaint  Patient presents with  . Sinus Problem   Patient complains of cough and runny nose. Cough is productive of thick yellow sputum. Additional symptoms include head congestion, postnasal drainage and wheezing. Onset of symptoms was 2 weeks ago, gradually worsening since that time. She is drinking plenty of fluids. Evaluation to date: none. Treatment to date: nasal steroids. She does not hve a history of asthma, COPD, or allergies. She does not smoke. Patient has not had recent close contact with someone who has tested positive for COVID-19. Patient is concerned as she is scheduled to have eye surgery next week and needs to be well prior to the surgery.    ROS: Per HPI  Current Outpatient Medications:  .  ALPRAZolam (XANAX) 0.5 MG tablet, TAKE 1 TABLET BY MOUTH TWICE A DAY AS NEEDED FOR ANXIETY, Disp: 60 tablet, Rfl: 0 .  aspirin EC 81 MG tablet, Take 81 mg by mouth daily., Disp: , Rfl:  .  atorvastatin (LIPITOR) 80 MG tablet, Take 1 tablet (80 mg total) by mouth daily., Disp: 90 tablet, Rfl: 3 .  Calcium Carbonate-Vitamin D (SUPER CALCIUM 600 + D3 PO), Take 1 tablet by mouth daily., Disp: , Rfl:  .  ciprofloxacin (CIPRO) 500 MG tablet, Take 1  tablet (500 mg total) by mouth 2 (two) times daily., Disp: 20 tablet, Rfl: 0 .  cyclobenzaprine (FLEXERIL) 5 MG tablet, TAKE 1 TABLET BY MOUTH THREE TIMES A DAY AS NEEDED FOR MUSCLE SPASMS, Disp: 30 tablet, Rfl: 0 .  diltiazem (CARDIZEM CD) 120 MG 24 hr capsule, TAKE (1) CAPSULE DAILY, Disp: 90 capsule, Rfl: 3 .  fluticasone (FLONASE) 50 MCG/ACT nasal spray, SPRAY 1 SPRAY INTO EACH NOSTRIL TWICE A DAY, Disp: 48 g, Rfl: 3 .  furosemide (LASIX) 20 MG tablet, Take 1 tablet (20 mg total) by mouth daily., Disp: 90 tablet, Rfl: 3 .  hydrocortisone 2.5 % ointment, Apply topically 2 (two) times daily., Disp: 30 g, Rfl: 5 .  hyoscyamine (LEVBID) 0.375 MG 12 hr tablet, Take 1 tablet (0.375 mg total) by mouth 2 (two) times daily., Disp: 60 tablet, Rfl: 2 .  losartan (COZAAR) 25 MG tablet, Take 1 tablet (25 mg total) by mouth 2 (two) times daily., Disp: 180 tablet, Rfl: 3 .  meclizine (ANTIVERT) 25 MG tablet, Take 1 tablet (25 mg total) by mouth 2 (two) times daily as needed for dizziness. Reported on 12/05/2015, Disp: 30 tablet, Rfl: 1 .  naproxen (EC NAPROSYN) 500 MG EC tablet, Take 1 tablet (500 mg total) by mouth 2 (two) times daily with a meal., Disp: 20 tablet, Rfl: 0 .  nitroGLYCERIN (NITROSTAT) 0.4 MG SL tablet, Place 1 tablet (0.4 mg total) under the tongue every 5 (five) minutes as needed for chest pain (x  3 doses). Reported on 12/05/2015, Disp: 25 tablet, Rfl: 1 .  omeprazole (PRILOSEC) 20 MG capsule, Take 1 capsule (20 mg total) by mouth daily., Disp: 90 capsule, Rfl: 3 .  potassium chloride (KLOR-CON 10) 10 MEQ tablet, Take 1 tablet (10 mEq total) by mouth daily., Disp: 90 tablet, Rfl: 3 .  spironolactone (ALDACTONE) 25 MG tablet, Take 0.5 tablets (12.5 mg total) by mouth 2 (two) times daily., Disp: 90 tablet, Rfl: 3  Allergies  Allergen Reactions  . Nifedipine Other (See Comments)    Brings blood pressure up really fast PROCARDIA  . Tape     bleeding  . Codeine Itching  . Crestor [Rosuvastatin]  Other (See Comments)    myalgias   Past Medical History:  Diagnosis Date  . Arthritis    Arthritis -hands -Bil. knee replacemnts  . AVNRT (AV nodal re-entry tachycardia) ([place])    s/p RFCA 09/2011  . Bronchitis    none recent  . CAD (coronary artery disease)    a. s/p Xience DES x 3 to LAD;  b. nuc study 02/27/10: EF 67% no ischemia;  c.  echo 11/12: Mild LVH, EF A999333, grade 1 diastolic dysfunction, moderate LAE;  d.  LHC 07/03/11: LAD stents patent, RCA 25%, EF 55-65% ;  e. Adeno. Myoview 5/14:  No ischemia, EF 68%  . Diverticular disease   . GERD (gastroesophageal reflux disease)   . Hemorrhoid   . Hepatitis    yellow jaundice as a child   . HTN (hypertension)   . Hx of echocardiogram    a.  Echo (05/2011):  Mild LVH, EF 55-60%, Gr 1 DD, mod LAE.b. Echo (11/14):  Mild LVH, mild focal basal septal hypertrophy, EF 55-60%, Gr 1 DD, mild LAE, atrial septal aneurysm  . Hyperlipidemia   . Hypertension   . Impaired hearing    bilateral- no hearing aids  . Jaundice   . Myocardial infarction (HCC)    hx of x 2   . Obesity   . OSA (obstructive sleep apnea)    needs no cpap per patient   . Other psoriasis    ongoing- none at present.  . Vertigo     Observations/Objective: A&O  No respiratory distress or wheezing audible over the phone Mood, judgement, and thought processes all WNL  Assessment and Plan: 1. Bacterial respiratory infection - Continue Flonase daily. Drink plenty of fluids.  - amoxicillin-clavulanate (AUGMENTIN) 875-125 MG tablet; Take 1 tablet by mouth 2 (two) times daily.  Dispense: 14 tablet; Refill: 0 - predniSONE (STERAPRED UNI-PAK 21 TAB) 10 MG (21) TBPK tablet; Use as directed on back of pill pack  Dispense: 21 tablet; Refill: 0 - benzonatate (TESSALON PERLES) 100 MG capsule; Take 1 capsule (100 mg total) by mouth 3 (three) times daily as needed for cough.  Dispense: 30 capsule; Refill: 0  Follow Up Instructions:  I discussed the assessment and treatment plan  with the patient. The patient was provided an opportunity to ask questions and all were answered. The patient agreed with the plan and demonstrated an understanding of the instructions.   The patient was advised to call back or seek an in-person evaluation if the symptoms worsen or if the condition fails to improve as anticipated.  The above assessment and management plan was discussed with the patient. The patient verbalized understanding of and has agreed to the management plan. Patient is aware to call the clinic if symptoms persist or worsen. Patient is aware when to return to the  clinic for a follow-up visit. Patient educated on when it is appropriate to go to the emergency department.   Time call ended: 8:54  I provided 10 minutes of non-face-to-face time during this encounter.  Hendricks Limes, MSN, APRN, FNP-C [facility] 04/07/19

## 2019-04-14 DIAGNOSIS — H25811 Combined forms of age-related cataract, right eye: Secondary | ICD-10-CM | POA: Diagnosis not present

## 2019-04-14 DIAGNOSIS — H2511 Age-related nuclear cataract, right eye: Secondary | ICD-10-CM | POA: Diagnosis not present

## 2019-05-04 ENCOUNTER — Ambulatory Visit (INDEPENDENT_AMBULATORY_CARE_PROVIDER_SITE_OTHER): Payer: Medicare HMO

## 2019-05-04 ENCOUNTER — Other Ambulatory Visit: Payer: Self-pay

## 2019-05-04 DIAGNOSIS — Z23 Encounter for immunization: Secondary | ICD-10-CM | POA: Diagnosis not present

## 2019-05-11 ENCOUNTER — Other Ambulatory Visit: Payer: Self-pay

## 2019-05-11 DIAGNOSIS — Z20822 Contact with and (suspected) exposure to covid-19: Secondary | ICD-10-CM

## 2019-05-13 LAB — NOVEL CORONAVIRUS, NAA: SARS-CoV-2, NAA: NOT DETECTED

## 2019-06-08 ENCOUNTER — Other Ambulatory Visit: Payer: Self-pay | Admitting: Physician Assistant

## 2019-06-08 DIAGNOSIS — Z1231 Encounter for screening mammogram for malignant neoplasm of breast: Secondary | ICD-10-CM

## 2019-06-26 ENCOUNTER — Telehealth: Payer: Self-pay | Admitting: Physician Assistant

## 2019-06-26 ENCOUNTER — Encounter: Payer: Self-pay | Admitting: Family Medicine

## 2019-06-26 ENCOUNTER — Other Ambulatory Visit: Payer: Self-pay

## 2019-06-26 ENCOUNTER — Ambulatory Visit (INDEPENDENT_AMBULATORY_CARE_PROVIDER_SITE_OTHER): Payer: Medicare HMO | Admitting: Family Medicine

## 2019-06-26 DIAGNOSIS — Z20822 Contact with and (suspected) exposure to covid-19: Secondary | ICD-10-CM

## 2019-06-26 DIAGNOSIS — J988 Other specified respiratory disorders: Secondary | ICD-10-CM | POA: Diagnosis not present

## 2019-06-26 DIAGNOSIS — B9689 Other specified bacterial agents as the cause of diseases classified elsewhere: Secondary | ICD-10-CM | POA: Diagnosis not present

## 2019-06-26 MED ORDER — AMOXICILLIN-POT CLAVULANATE 875-125 MG PO TABS: 1.0000 | ORAL_TABLET | Freq: Two times a day (BID) | ORAL | 0 refills | Status: DC

## 2019-06-26 MED ORDER — PREDNISONE 10 MG PO TABS: ORAL_TABLET | ORAL | 0 refills | Status: DC

## 2019-06-26 NOTE — Telephone Encounter (Signed)
Pt scheduled with Dr Livia Snellen for televisit today.

## 2019-06-26 NOTE — Progress Notes (Signed)
Subjective:    Patient ID: Sara [name], female    DOB: 1945/09/20, 73 y.o.   MRN: [id number]   HPI: Sara [name] is a 73 y.o. female presenting for Symptoms include congestion, facial pain, nasal congestion, non productive cough, post nasal drip and sinus pressure. There is no fever, chills, or sweats. Onset of symptoms was a few days ago, gradually worsening since that time.     Depression screen [facility] 2/9 02/03/2019 01/28/2019 08/07/2018 04/08/2018 12/24/2017  Decreased Interest 0 0 0 2 0  Down, Depressed, Hopeless 0 0 0 2 0  PHQ - 2 Score 0 0 0 4 0  Altered sleeping - - 2 3 -  Tired, decreased energy - - 3 2 -  Change in appetite - - 0 1 -  Feeling bad or failure about yourself  - - 0 0 -  Trouble concentrating - - 0 2 -  Moving slowly or fidgety/restless - - 0 1 -  Suicidal thoughts - - 0 0 -  PHQ-9 Score - - 5 13 -  Difficult doing work/chores - - Somewhat difficult - -     Relevant past medical, surgical, family and social history reviewed and updated as indicated.  Interim medical history since our last visit reviewed. Allergies and medications reviewed and updated.  ROS:  Review of Systems  Constitutional: Negative for activity change, appetite change, chills and fever.  HENT: Positive for congestion, postnasal drip, rhinorrhea and sinus pressure. Negative for ear discharge, ear pain, hearing loss, nosebleeds, sneezing and trouble swallowing.   Respiratory: Negative for chest tightness and shortness of breath.   Cardiovascular: Negative for chest pain and palpitations.  Skin: Negative for rash.     Social History   Tobacco Use  Smoking Status Never Smoker  Smokeless Tobacco Never Used       Objective:     Wt Readings from Last 3 Encounters:  02/03/19 250 lb 3.2 oz (113.5 kg)  01/28/19 249 lb 3.2 oz (113 kg)  08/27/18 262 lb (118.8 kg)     Exam deferred. Pt. Harbor[place] due to COVID 19. Phone visit performed.   Assessment & Plan:   1.  Bacterial respiratory infection     Meds ordered this encounter  Medications  . amoxicillin-clavulanate (AUGMENTIN) 875-125 MG tablet    Sig: Take 1 tablet by mouth 2 (two) times daily.    Dispense:  14 tablet    Refill:  0  . predniSONE (DELTASONE) 10 MG tablet    Sig: Take 5 daily for 2 days followed by 4,3,2 and 1 for 2 days each.    Dispense:  30 tablet    Refill:  0    No orders of the defined types were placed in this encounter.     Diagnoses and all orders for this visit:  Bacterial respiratory infection -     amoxicillin-clavulanate (AUGMENTIN) 875-125 MG tablet; Take 1 tablet by mouth 2 (two) times daily. -     Cancel: COVID  Other orders -     predniSONE (DELTASONE) 10 MG tablet; Take 5 daily for 2 days followed by 4,3,2 and 1 for 2 days each.    Virtual Visit via telephone Note  I discussed the limitations, risks, security and privacy concerns of performing an evaluation and management service by telephone and the availability of in person appointments. The patient was identified with two identifiers. Pt.expressed understanding and agreed to proceed. Pt. Is at home. Dr. Livia Snellen is in his  office.  Follow Up Instructions:   I discussed the assessment and treatment plan with the patient. The patient was provided an opportunity to ask questions and all were answered. The patient agreed with the plan and demonstrated an understanding of the instructions.   The patient was advised to call back or seek an in-person evaluation if the symptoms worsen or if the condition fails to improve as anticipated.   Total minutes including chart review and phone contact time: 12   Follow up plan: Return if symptoms worsen or fail to improve.  Claretta Fraise, MD [place]

## 2019-06-26 NOTE — Telephone Encounter (Signed)
Patient called stating that she had a televisit with Britney on 04/07/19 and was prescribed some medicine for Sinus'. Wants to know if that medicine can be prescribed to her again because she is having the same issues now and says the medicine really helped her last time. Wants nurse to call her back.

## 2019-06-27 LAB — NOVEL CORONAVIRUS, NAA: SARS-CoV-2, NAA: DETECTED — AB

## 2019-06-28 ENCOUNTER — Telehealth: Payer: Self-pay | Admitting: Unknown Physician Specialty

## 2019-06-28 NOTE — Telephone Encounter (Signed)
Mild nasal congestions for on week.  Discussed with patient about Covid symptoms and the use of bamlanivimab, a monoclonal antibody infusion for those with mild to moderate Covid symptoms and at a high risk of hospitalization.  Pt is qualified for this infusion at the [facility] infusion center due to Age > 65    After discussing the infusion's costs, potential benefits and side effects, the patient has decided to decline treatment with monoclonal antibodies.

## 2019-07-08 ENCOUNTER — Ambulatory Visit (INDEPENDENT_AMBULATORY_CARE_PROVIDER_SITE_OTHER): Payer: Medicare HMO | Admitting: Physician Assistant

## 2019-07-08 ENCOUNTER — Encounter: Payer: Self-pay | Admitting: Physician Assistant

## 2019-07-08 DIAGNOSIS — J40 Bronchitis, not specified as acute or chronic: Secondary | ICD-10-CM | POA: Diagnosis not present

## 2019-07-08 DIAGNOSIS — U071 COVID-19: Secondary | ICD-10-CM | POA: Diagnosis not present

## 2019-07-08 MED ORDER — AZITHROMYCIN 250 MG PO TABS: ORAL_TABLET | ORAL | 0 refills | Status: DC

## 2019-07-08 MED ORDER — ALBUTEROL SULFATE HFA 108 (90 BASE) MCG/ACT IN AERS: 2.0000 | INHALATION_SPRAY | Freq: Four times a day (QID) | RESPIRATORY_TRACT | 1 refills | Status: DC | PRN

## 2019-07-08 MED ORDER — PREDNISONE 10 MG (48) PO TBPK: ORAL_TABLET | ORAL | 0 refills | Status: DC

## 2019-07-08 NOTE — Progress Notes (Signed)
Telephone visit  Subjective: [id number], COVID infection PCP: Terald Sleeper, PA-C [id number] Sara [name] is a 73 y.o. female calls for telephone consult today. Patient provides verbal consent for consult held via phone.  Patient is identified with 2 separate identifiers.  At this time the entire area is on COVID-19 social distancing and stay home orders are in place.  Patient is of higher risk and therefore we are performing this by a virtual method.  Location of patient: home Location of provider: HOME Others present for call: no  She was diagnosed with COVID over 3 weeks ago. She has continued to be sick and coughing and wheezing, especially in the last 2 days.Patient with several days of progressing upper respiratory and bronchial symptoms.Cough that is productive throughout the day and severe at night. There is occasional wheezing after coughing. Sometimes there is slight dyspnea on exertion. It is productive mucus that is yellow in color. Denies any blood.    ROS: Per HPI  Allergies  Allergen Reactions  . Nifedipine Other (See Comments)    Brings blood pressure up really fast PROCARDIA  . Tape     bleeding  . Codeine Itching  . Crestor [Rosuvastatin] Other (See Comments)    myalgias   Past Medical History:  Diagnosis Date  . Arthritis    Arthritis -hands -Bil. knee replacemnts  . 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 Bronchitis    none recent  . CAD (coronary artery disease)    a. s/p Xience DES x 3 to LAD;  b. nuc study 02/27/10: EF 67% no ischemia;  c.  echo 11/12: Mild LVH, EF A999333, grade 1 diastolic dysfunction, moderate LAE;  d.  LHC 07/03/11: LAD stents patent, RCA 25%, EF 55-65% ;  e. Adeno. Myoview 5/14:  No ischemia, EF 68%  . Diverticular disease   . GERD (gastroesophageal reflux disease)   . Hemorrhoid   . Hepatitis    yellow jaundice as a child   . HTN (hypertension)   . Hx of echocardiogram    a.  Echo (05/2011):   Mild LVH, EF 55-60%, Gr 1 DD, mod LAE.b. Echo (11/14):  Mild LVH, mild focal basal septal hypertrophy, EF 55-60%, Gr 1 DD, mild LAE, atrial septal aneurysm  . Hyperlipidemia   . Hypertension   . Impaired hearing    bilateral- no hearing aids  . Jaundice   . Myocardial infarction (HCC)    hx of x 2   . Obesity   . OSA (obstructive sleep apnea)    needs no cpap per patient   . Other psoriasis    ongoing- none at present.  . Vertigo     Current Outpatient Medications:  .  albuterol (VENTOLIN HFA) 108 (90 Base) MCG/ACT inhaler, Inhale 2 puffs into the lungs every 6 (six) hours as needed for wheezing or shortness of breath., Disp: 18 g, Rfl: 1 .  ALPRAZolam (XANAX) 0.5 MG tablet, TAKE 1 TABLET BY MOUTH TWICE A DAY AS NEEDED FOR ANXIETY, Disp: 60 tablet, Rfl: 0 .  amoxicillin-clavulanate (AUGMENTIN) 875-125 MG tablet, Take 1 tablet by mouth 2 (two) times daily., Disp: 14 tablet, Rfl: 0 .  aspirin EC 81 MG tablet, Take 81 mg by mouth daily., Disp: , Rfl:  .  atorvastatin (LIPITOR) 80 MG tablet, Take 1 tablet (80 mg total) by mouth daily., Disp: 90 tablet, Rfl: 3 .  azithromycin (ZITHROMAX Z-PAK) 250 MG tablet, Take as directed, Disp:  6 each, Rfl: 0 .  benzonatate (TESSALON PERLES) 100 MG capsule, Take 1 capsule (100 mg total) by mouth 3 (three) times daily as needed for cough., Disp: 30 capsule, Rfl: 0 .  Calcium Carbonate-Vitamin D (SUPER CALCIUM 600 + D3 PO), Take 1 tablet by mouth daily., Disp: , Rfl:  .  cyclobenzaprine (FLEXERIL) 5 MG tablet, TAKE 1 TABLET BY MOUTH THREE TIMES A DAY AS NEEDED FOR MUSCLE SPASMS, Disp: 30 tablet, Rfl: 0 .  diltiazem (CARDIZEM CD) 120 MG 24 hr capsule, TAKE (1) CAPSULE DAILY, Disp: 90 capsule, Rfl: 3 .  fluticasone (FLONASE) 50 MCG/ACT nasal spray, SPRAY 1 SPRAY INTO EACH NOSTRIL TWICE A DAY, Disp: 48 g, Rfl: 3 .  furosemide (LASIX) 20 MG tablet, Take 1 tablet (20 mg total) by mouth daily., Disp: 90 tablet, Rfl: 3 .  hydrocortisone 2.5 % ointment, Apply  topically 2 (two) times daily., Disp: 30 g, Rfl: 5 .  hyoscyamine (LEVBID) 0.375 MG 12 hr tablet, Take 1 tablet (0.375 mg total) by mouth 2 (two) times daily., Disp: 60 tablet, Rfl: 2 .  losartan (COZAAR) 25 MG tablet, Take 1 tablet (25 mg total) by mouth 2 (two) times daily., Disp: 180 tablet, Rfl: 3 .  meclizine (ANTIVERT) 25 MG tablet, Take 1 tablet (25 mg total) by mouth 2 (two) times daily as needed for dizziness. Reported on 12/05/2015, Disp: 30 tablet, Rfl: 1 .  naproxen (EC NAPROSYN) 500 MG EC tablet, Take 1 tablet (500 mg total) by mouth 2 (two) times daily with a meal., Disp: 20 tablet, Rfl: 0 .  nitroGLYCERIN (NITROSTAT) 0.4 MG SL tablet, Place 1 tablet (0.4 mg total) under the tongue every 5 (five) minutes as needed for chest pain (x 3 doses). Reported on 12/05/2015, Disp: 25 tablet, Rfl: 1 .  omeprazole (PRILOSEC) 20 MG capsule, Take 1 capsule (20 mg total) by mouth daily., Disp: 90 capsule, Rfl: 3 .  potassium chloride (KLOR-CON 10) 10 MEQ tablet, Take 1 tablet (10 mEq total) by mouth daily., Disp: 90 tablet, Rfl: 3 .  predniSONE (STERAPRED UNI-PAK 48 TAB) 10 MG (48) TBPK tablet, Take as directed for 12 days, Disp: 48 tablet, Rfl: 0 .  spironolactone (ALDACTONE) 25 MG tablet, Take 0.5 tablets (12.5 mg total) by mouth 2 (two) times daily., Disp: 90 tablet, Rfl: 3  Assessment/ Plan: 73 y.o. female   1. COVID-19 virus infection - albuterol (VENTOLIN HFA) 108 (90 Base) MCG/ACT inhaler; Inhale 2 puffs into the lungs every 6 (six) hours as needed for wheezing or shortness of breath.  Dispense: 18 g; Refill: 1 - azithromycin (ZITHROMAX Z-PAK) 250 MG tablet; Take as directed  Dispense: 6 each; Refill: 0 - predniSONE (STERAPRED UNI-PAK 48 TAB) 10 MG (48) TBPK tablet; Take as directed for 12 days  Dispense: 48 tablet; Refill: 0  2. Bronchitis with wheezing - albuterol (VENTOLIN HFA) 108 (90 Base) MCG/ACT inhaler; Inhale 2 puffs into the lungs every 6 (six) hours as needed for wheezing or  shortness of breath.  Dispense: 18 g; Refill: 1 - azithromycin (ZITHROMAX Z-PAK) 250 MG tablet; Take as directed  Dispense: 6 each; Refill: 0 - predniSONE (STERAPRED UNI-PAK 48 TAB) 10 MG (48) TBPK tablet; Take as directed for 12 days  Dispense: 48 tablet; Refill: 0   No follow-ups on file.  Continue all other maintenance medications as listed above.  Start time: 3:35 PM End time: 3:46 PM  Meds ordered this encounter  Medications  . albuterol (VENTOLIN HFA) 108 (90 Base) MCG/ACT  inhaler    Sig: Inhale 2 puffs into the lungs every 6 (six) hours as needed for wheezing or shortness of breath.    Dispense:  18 g    Refill:  1    Order Specific Question:   Supervising Provider    Answer:   Janora Norlander [id number]  . azithromycin (ZITHROMAX Z-PAK) 250 MG tablet    Sig: Take as directed    Dispense:  6 each    Refill:  0    Order Specific Question:   Supervising Provider    Answer:   Janora Norlander [id number]  . predniSONE (STERAPRED UNI-PAK 48 TAB) 10 MG (48) TBPK tablet    Sig: Take as directed for 12 days    Dispense:  48 tablet    Refill:  0    Order Specific Question:   Supervising Provider    Answer:   Janora Norlander [id number]    Particia Nearing PA-C [place] [phone]

## 2019-07-14 DIAGNOSIS — E785 Hyperlipidemia, unspecified: Secondary | ICD-10-CM | POA: Diagnosis not present

## 2019-07-14 DIAGNOSIS — I110 Hypertensive heart disease with heart failure: Secondary | ICD-10-CM | POA: Diagnosis not present

## 2019-07-14 DIAGNOSIS — I509 Heart failure, unspecified: Secondary | ICD-10-CM | POA: Diagnosis not present

## 2019-07-14 DIAGNOSIS — I499 Cardiac arrhythmia, unspecified: Secondary | ICD-10-CM | POA: Diagnosis not present

## 2019-07-14 DIAGNOSIS — I2510 Atherosclerotic heart disease of native coronary artery without angina pectoris: Secondary | ICD-10-CM | POA: Diagnosis not present

## 2019-07-14 DIAGNOSIS — J988 Other specified respiratory disorders: Secondary | ICD-10-CM | POA: Diagnosis not present

## 2019-07-14 DIAGNOSIS — I252 Old myocardial infarction: Secondary | ICD-10-CM | POA: Diagnosis not present

## 2019-07-14 DIAGNOSIS — E261 Secondary hyperaldosteronism: Secondary | ICD-10-CM | POA: Diagnosis not present

## 2019-07-14 DIAGNOSIS — K219 Gastro-esophageal reflux disease without esophagitis: Secondary | ICD-10-CM | POA: Diagnosis not present

## 2019-07-24 DIAGNOSIS — U071 COVID-19: Secondary | ICD-10-CM

## 2019-07-24 HISTORY — DX: COVID-19: U07.1

## 2019-08-03 ENCOUNTER — Ambulatory Visit: Payer: Medicare HMO

## 2019-08-07 ENCOUNTER — Other Ambulatory Visit: Payer: Self-pay | Admitting: Physician Assistant

## 2019-08-07 ENCOUNTER — Telehealth: Payer: Self-pay | Admitting: Physician Assistant

## 2019-08-07 DIAGNOSIS — J40 Bronchitis, not specified as acute or chronic: Secondary | ICD-10-CM

## 2019-08-07 DIAGNOSIS — U071 COVID-19: Secondary | ICD-10-CM

## 2019-08-07 MED ORDER — BUDESONIDE-FORMOTEROL FUMARATE 80-4.5 MCG/ACT IN AERO: 2.0000 | INHALATION_SPRAY | Freq: Two times a day (BID) | RESPIRATORY_TRACT | 3 refills | Status: DC

## 2019-08-07 NOTE — Telephone Encounter (Signed)
Aware. 

## 2019-08-07 NOTE — Telephone Encounter (Signed)
Sent symbicort, zpak

## 2019-08-07 NOTE — Telephone Encounter (Signed)
Please advise on request for machine.

## 2019-08-07 NOTE — Telephone Encounter (Signed)
Patient called to see if Sara [name] could prescribe her a nebulizer for her breathing. Says since having COVID (10 wks ago), Sara [name] has prescribed her ALBUTEROL on 07/08/19 but says it is not really helping.

## 2019-08-07 NOTE — Telephone Encounter (Signed)
She is willing to try another inhaler.  Please send one to pharmacy.  She still has cough and yellow sputum.

## 2019-08-07 NOTE — Telephone Encounter (Signed)
The nebulizer generally has albuterol as the main medication.  If albuterol alone is not really helping I would recommend adding another controller medication like Symbicort or Dulera.

## 2019-08-10 DIAGNOSIS — R69 Illness, unspecified: Secondary | ICD-10-CM | POA: Diagnosis not present

## 2019-08-12 DIAGNOSIS — Z961 Presence of intraocular lens: Secondary | ICD-10-CM | POA: Diagnosis not present

## 2019-09-10 ENCOUNTER — Ambulatory Visit: Payer: Medicare HMO

## 2019-10-05 DIAGNOSIS — H1132 Conjunctival hemorrhage, left eye: Secondary | ICD-10-CM | POA: Diagnosis not present

## 2019-11-27 DIAGNOSIS — Z6841 Body Mass Index (BMI) 40.0 and over, adult: Secondary | ICD-10-CM | POA: Diagnosis not present

## 2019-11-27 DIAGNOSIS — K219 Gastro-esophageal reflux disease without esophagitis: Secondary | ICD-10-CM | POA: Diagnosis not present

## 2019-11-27 DIAGNOSIS — I509 Heart failure, unspecified: Secondary | ICD-10-CM | POA: Diagnosis not present

## 2019-11-27 DIAGNOSIS — I252 Old myocardial infarction: Secondary | ICD-10-CM | POA: Diagnosis not present

## 2019-11-27 DIAGNOSIS — I2510 Atherosclerotic heart disease of native coronary artery without angina pectoris: Secondary | ICD-10-CM | POA: Diagnosis not present

## 2019-11-27 DIAGNOSIS — I499 Cardiac arrhythmia, unspecified: Secondary | ICD-10-CM | POA: Diagnosis not present

## 2019-11-27 DIAGNOSIS — Z008 Encounter for other general examination: Secondary | ICD-10-CM | POA: Diagnosis not present

## 2019-11-27 DIAGNOSIS — I110 Hypertensive heart disease with heart failure: Secondary | ICD-10-CM | POA: Diagnosis not present

## 2019-11-27 DIAGNOSIS — E261 Secondary hyperaldosteronism: Secondary | ICD-10-CM | POA: Diagnosis not present

## 2019-11-27 DIAGNOSIS — E785 Hyperlipidemia, unspecified: Secondary | ICD-10-CM | POA: Diagnosis not present

## 2019-12-11 ENCOUNTER — Other Ambulatory Visit: Payer: Self-pay

## 2019-12-11 ENCOUNTER — Ambulatory Visit (INDEPENDENT_AMBULATORY_CARE_PROVIDER_SITE_OTHER): Payer: Medicare HMO | Admitting: Family Medicine

## 2019-12-11 ENCOUNTER — Encounter: Payer: Self-pay | Admitting: Family Medicine

## 2019-12-11 VITALS — BP 115/70 | HR 75 | Temp 98.2°F | Ht 64.0 in | Wt 256.4 lb

## 2019-12-11 DIAGNOSIS — R7303 Prediabetes: Secondary | ICD-10-CM | POA: Diagnosis not present

## 2019-12-11 DIAGNOSIS — E782 Mixed hyperlipidemia: Secondary | ICD-10-CM | POA: Diagnosis not present

## 2019-12-11 DIAGNOSIS — E119 Type 2 diabetes mellitus without complications: Secondary | ICD-10-CM

## 2019-12-11 DIAGNOSIS — I10 Essential (primary) hypertension: Secondary | ICD-10-CM

## 2019-12-11 DIAGNOSIS — Z7689 Persons encountering health services in other specified circumstances: Secondary | ICD-10-CM | POA: Diagnosis not present

## 2019-12-11 DIAGNOSIS — I2510 Atherosclerotic heart disease of native coronary artery without angina pectoris: Secondary | ICD-10-CM

## 2019-12-11 LAB — BAYER DCA HB A1C WAIVED: HB A1C (BAYER DCA - WAIVED): 6.5 % (ref <7.0)

## 2019-12-11 MED ORDER — NITROGLYCERIN 0.4 MG SL SUBL: 0.4000 mg | SUBLINGUAL_TABLET | SUBLINGUAL | 1 refills | Status: DC | PRN

## 2019-12-11 NOTE — Progress Notes (Signed)
Subjective: CC:HTN, HLD, est care PCP: Janora Norlander, DO Sara [name] is a 74 y.o. female presenting to clinic today for:  1. HTN, HLD, CAD, PreDM Patient reports compliance with her atorvastatin, diltiazem, furosemide, Cozaar, spironolactone and potassium.  She is followed by Dr. Percival Spanish.  She has medical history significant for 2 MIs in the past and 3 stents.  Denies any chest pain, shortness of breath, lower extremity edema, change in exercise tolerance.  No headaches.  No dizziness.  2.  Panic attacks Patient reports extremely rare use of Xanax.  No recent refills because she has not needed this.  ROS: Per HPI  Allergies  Allergen Reactions  . Nifedipine Other (See Comments)    Brings blood pressure up really fast PROCARDIA  . Tape     bleeding  . Codeine Itching  . Crestor [Rosuvastatin] Other (See Comments)    myalgias   Past Medical History:  Diagnosis Date  . Arthritis    Arthritis -hands -Bil. knee replacemnts  . AVNRT (AV nodal re-entry tachycardia) ([place])    s/p RFCA 09/2011  . Bronchitis    none recent  . CAD (coronary artery disease)    a. s/p Xience DES x 3 to LAD;  b. nuc study 02/27/10: EF 67% no ischemia;  c.  echo 11/12: Mild LVH, EF 22-02%, grade 1 diastolic dysfunction, moderate LAE;  d.  LHC 07/03/11: LAD stents patent, RCA 25%, EF 55-65% ;  e. [place]. Myoview 5/14:  No ischemia, EF 68%  . Diverticular disease   . GERD (gastroesophageal reflux disease)   . Hemorrhoid   . Hepatitis    yellow jaundice as a child   . HTN (hypertension)   . Hx of echocardiogram    a.  Echo (05/2011):  Mild LVH, EF 55-60%, Gr 1 DD, mod LAE.b. Echo (11/14):  Mild LVH, mild focal basal septal hypertrophy, EF 55-60%, Gr 1 DD, mild LAE, atrial septal aneurysm  . Hyperlipidemia   . Hypertension   . Impaired hearing    bilateral- no hearing aids  . Jaundice   . Myocardial infarction (HCC)    hx of x 2   . Obesity   . OSA (obstructive sleep apnea)    needs  no cpap per patient   . Other psoriasis    ongoing- none at present.  . Vertigo     Current Outpatient Medications:  .  albuterol (VENTOLIN HFA) 108 (90 Base) MCG/ACT inhaler, Inhale 2 puffs into the lungs every 6 (six) hours as needed for wheezing or shortness of breath., Disp: 18 g, Rfl: 1 .  ALPRAZolam (XANAX) 0.5 MG tablet, TAKE 1 TABLET BY MOUTH TWICE A DAY AS NEEDED FOR ANXIETY, Disp: 60 tablet, Rfl: 0 .  aspirin EC 81 MG tablet, Take 81 mg by mouth daily., Disp: , Rfl:  .  atorvastatin (LIPITOR) 80 MG tablet, Take 1 tablet (80 mg total) by mouth daily., Disp: 90 tablet, Rfl: 3 .  budesonide-formoterol (SYMBICORT) 80-4.5 MCG/ACT inhaler, Inhale 2 puffs into the lungs 2 (two) times daily., Disp: 1 Inhaler, Rfl: 3 .  Calcium Carbonate-Vitamin D (SUPER CALCIUM 600 + D3 PO), Take 1 tablet by mouth daily., Disp: , Rfl:  .  cyclobenzaprine (FLEXERIL) 5 MG tablet, TAKE 1 TABLET BY MOUTH THREE TIMES A DAY AS NEEDED FOR MUSCLE SPASMS, Disp: 30 tablet, Rfl: 0 .  diclofenac Sodium (VOLTAREN) 1 % GEL, Voltaren 1 % topical gel, Disp: , Rfl:  .  diltiazem (CARDIZEM CD) 120  MG 24 hr capsule, TAKE (1) CAPSULE DAILY, Disp: 90 capsule, Rfl: 3 .  fluticasone (FLONASE) 50 MCG/ACT nasal spray, SPRAY 1 SPRAY INTO EACH NOSTRIL TWICE A DAY, Disp: 48 g, Rfl: 3 .  furosemide (LASIX) 20 MG tablet, Take 1 tablet (20 mg total) by mouth daily., Disp: 90 tablet, Rfl: 3 .  hydrocortisone 2.5 % ointment, Apply topically 2 (two) times daily., Disp: 30 g, Rfl: 5 .  hyoscyamine (LEVBID) 0.375 MG 12 hr tablet, Take 1 tablet (0.375 mg total) by mouth 2 (two) times daily., Disp: 60 tablet, Rfl: 2 .  losartan (COZAAR) 25 MG tablet, Take 1 tablet (25 mg total) by mouth 2 (two) times daily., Disp: 180 tablet, Rfl: 3 .  meclizine (ANTIVERT) 25 MG tablet, Take 1 tablet (25 mg total) by mouth 2 (two) times daily as needed for dizziness. Reported on 12/05/2015, Disp: 30 tablet, Rfl: 1 .  nitroGLYCERIN (NITROSTAT) 0.4 MG SL tablet,  Place 1 tablet (0.4 mg total) under the tongue every 5 (five) minutes as needed for chest pain (x 3 doses). Reported on 12/05/2015, Disp: 25 tablet, Rfl: 1 .  omeprazole (PRILOSEC) 20 MG capsule, Take 1 capsule (20 mg total) by mouth daily., Disp: 90 capsule, Rfl: 3 .  potassium chloride (KLOR-CON 10) 10 MEQ tablet, Take 1 tablet (10 mEq total) by mouth daily., Disp: 90 tablet, Rfl: 3 .  spironolactone (ALDACTONE) 25 MG tablet, Take 0.5 tablets (12.5 mg total) by mouth 2 (two) times daily., Disp: 90 tablet, Rfl: 3 Social History   Socioeconomic History  . Marital status: Married    Spouse name: Not on file  . Number of children: 3  . Years of education: Not on file  . Highest education level: Not on file  Occupational History  . Occupation: hairdresser  Tobacco Use  . Smoking status: Never Smoker  . Smokeless tobacco: Never Used  Substance and Sexual Activity  . Alcohol use: No  . Drug use: No  . Sexual activity: Not Currently    Birth control/protection: Post-menopausal  Other Topics Concern  . Not on file  Social History Narrative  . Not on file   Social Determinants of Health   Financial Resource Strain:   . Difficulty of Paying Living Expenses:   Food Insecurity:   . Worried About Charity fundraiser in the Last Year:   . Arboriculturist in the Last Year:   Transportation Needs:   . Film/video editor (Medical):   Marland Kitchen Lack of Transportation (Non-Medical):   Physical Activity:   . Days of Exercise per Week:   . Minutes of Exercise per Session:   Stress:   . Feeling of Stress :   Social Connections:   . Frequency of Communication with Friends and Family:   . Frequency of Social Gatherings with Friends and Family:   . Attends Religious Services:   . Active Member of Clubs or Organizations:   . Attends Archivist Meetings:   Marland Kitchen Marital Status:   Intimate Partner Violence:   . Fear of Current or Ex-Partner:   . Emotionally Abused:   Marland Kitchen Physically Abused:     . Sexually Abused:    Family History  Problem Relation Age of Onset  . Aneurysm Father   . Stroke Mother   . Hypertension Mother   . Heart failure Mother   . Breast cancer Mother   . Seizures Brother   . Muscular dystrophy Daughter  dx as infant, passed age 68  . Seizures Son   . Colon cancer Neg Hx     Objective: Office vital signs reviewed. BP 115/70   Pulse 75   Temp 98.2 F (36.8 C)   Ht '5\' 4"'  (1.626 m)   Wt 256 lb 6.4 oz (116.3 kg)   SpO2 93%   BMI 44.01 kg/m   Physical Examination:  General: Awake, alert, well nourished, No acute distress HEENT: Normal, sclera white, MMM Cardio: regular rate and rhythm, S1S2 heard, no murmurs appreciated Pulm: clear to auscultation bilaterally, no wheezes, rhonchi or rales; normal work of breathing on room air Extremities: warm, well perfused, No edema, cyanosis or clubbing; +2 pulses bilaterally MSK: slow gait and normal station Psych: Mood stable, speech normal, affect appropriate, pleasant and interactive  Assessment/ Plan: 73 y.o. female   1. Coronary artery disease involving native coronary artery of native heart without angina pectoris Check nonfasting labs - CMP14+EGFR - Lipid Panel - TSH  2. Essential hypertension Controlled - CMP14+EGFR  3. Mixed hyperlipidemia Nonfasting labs - CMP14+EGFR - Lipid Panel - TSH  4. Establishing care with new doctor, encounter for  5. Prediabetes, New onset DM A1c with now technically diabetes at 6.5.  Need to discuss this further with patient.  No medications needed at this time but will need to start doing other preventative care measures now that she is in the diabetic range. - Bayer DCA Hb A1c Waived   No orders of the defined types were placed in this encounter.  No orders of the defined types were placed in this encounter.    Janora Norlander, DO [place] [phone]

## 2019-12-12 LAB — CMP14+EGFR
ALT: 12 IU/L (ref 0–32)
AST: 21 IU/L (ref 0–40)
Albumin/Globulin Ratio: 1.7 (ref 1.2–2.2)
Albumin: 3.7 g/dL (ref 3.7–4.7)
Alkaline Phosphatase: 74 IU/L (ref 48–121)
BUN/Creatinine Ratio: 20 (ref 12–28)
BUN: 17 mg/dL (ref 8–27)
Bilirubin Total: 0.9 mg/dL (ref 0.0–1.2)
CO2: 20 mmol/L (ref 20–29)
Calcium: 8.9 mg/dL (ref 8.7–10.3)
Chloride: 107 mmol/L — ABNORMAL HIGH (ref 96–106)
Creatinine, Ser: 0.85 mg/dL (ref 0.57–1.00)
GFR calc Af Amer: 78 mL/min/{1.73_m2} (ref >59)
GFR calc non Af Amer: 68 mL/min/{1.73_m2} (ref >59)
Globulin, Total: 2.2 g/dL (ref 1.5–4.5)
Glucose: 102 mg/dL — ABNORMAL HIGH (ref 65–99)
Potassium: 4.2 mmol/L (ref 3.5–5.2)
Sodium: 142 mmol/L (ref 134–144)
Total Protein: 5.9 g/dL — ABNORMAL LOW (ref 6.0–8.5)

## 2019-12-12 LAB — LIPID PANEL
Chol/HDL Ratio: 2.9 ratio (ref 0.0–4.4)
Cholesterol, Total: 147 mg/dL (ref 100–199)
HDL: 50 mg/dL (ref >39)
LDL Chol Calc (NIH): 79 mg/dL (ref 0–99)
Triglycerides: 95 mg/dL (ref 0–149)
VLDL Cholesterol Cal: 18 mg/dL (ref 5–40)

## 2019-12-12 LAB — TSH: TSH: 1.37 u[IU]/mL (ref 0.450–4.500)

## 2019-12-14 ENCOUNTER — Telehealth: Payer: Self-pay | Admitting: Family Medicine

## 2019-12-14 NOTE — Telephone Encounter (Signed)
Advised pt to bring copy of vaccine card so we can document it in chart. Pt voiced understanding.

## 2019-12-25 NOTE — Progress Notes (Signed)
Result letter sent to patient

## 2019-12-28 NOTE — Progress Notes (Signed)
PATIENT AWARE

## 2020-02-01 ENCOUNTER — Other Ambulatory Visit: Payer: Self-pay | Admitting: Family Medicine

## 2020-02-01 DIAGNOSIS — Z1231 Encounter for screening mammogram for malignant neoplasm of breast: Secondary | ICD-10-CM

## 2020-02-08 DIAGNOSIS — R69 Illness, unspecified: Secondary | ICD-10-CM | POA: Diagnosis not present

## 2020-02-10 DIAGNOSIS — M1811 Unilateral primary osteoarthritis of first carpometacarpal joint, right hand: Secondary | ICD-10-CM | POA: Diagnosis not present

## 2020-02-10 DIAGNOSIS — M180 Bilateral primary osteoarthritis of first carpometacarpal joints: Secondary | ICD-10-CM | POA: Diagnosis not present

## 2020-02-10 DIAGNOSIS — M79645 Pain in left finger(s): Secondary | ICD-10-CM | POA: Diagnosis not present

## 2020-02-10 DIAGNOSIS — M79641 Pain in right hand: Secondary | ICD-10-CM | POA: Diagnosis not present

## 2020-02-10 DIAGNOSIS — M1812 Unilateral primary osteoarthritis of first carpometacarpal joint, left hand: Secondary | ICD-10-CM | POA: Diagnosis not present

## 2020-02-15 ENCOUNTER — Ambulatory Visit
Admission: RE | Admit: 2020-02-15 | Discharge: 2020-02-15 | Disposition: A | Discharge status: Home / Self Care | Payer: Medicare HMO | Source: Ambulatory Visit | Attending: Family Medicine | Admitting: Family Medicine

## 2020-02-15 ENCOUNTER — Other Ambulatory Visit: Payer: Self-pay

## 2020-02-15 DIAGNOSIS — Z1231 Encounter for screening mammogram for malignant neoplasm of breast: Secondary | ICD-10-CM

## 2020-02-17 ENCOUNTER — Ambulatory Visit (INDEPENDENT_AMBULATORY_CARE_PROVIDER_SITE_OTHER): Payer: Medicare HMO | Admitting: Family Medicine

## 2020-02-17 ENCOUNTER — Encounter: Payer: Self-pay | Admitting: Family Medicine

## 2020-02-17 DIAGNOSIS — R3989 Other symptoms and signs involving the genitourinary system: Secondary | ICD-10-CM

## 2020-02-17 DIAGNOSIS — R399 Unspecified symptoms and signs involving the genitourinary system: Secondary | ICD-10-CM

## 2020-02-17 DIAGNOSIS — T3695XA Adverse effect of unspecified systemic antibiotic, initial encounter: Secondary | ICD-10-CM | POA: Diagnosis not present

## 2020-02-17 DIAGNOSIS — B379 Candidiasis, unspecified: Secondary | ICD-10-CM | POA: Diagnosis not present

## 2020-02-17 LAB — URINALYSIS, COMPLETE
Bilirubin, UA: NEGATIVE
Glucose, UA: NEGATIVE
Ketones, UA: NEGATIVE
Nitrite, UA: NEGATIVE
Protein,UA: NEGATIVE
RBC, UA: NEGATIVE
Specific Gravity, UA: 1.02 (ref 1.005–1.030)
Urobilinogen, Ur: 0.2 mg/dL (ref 0.2–1.0)
pH, UA: 5 (ref 5.0–7.5)

## 2020-02-17 LAB — MICROSCOPIC EXAMINATION
Bacteria, UA: NONE SEEN
RBC, Urine: NONE SEEN /hpf (ref 0–2)

## 2020-02-17 MED ORDER — FLUCONAZOLE 150 MG PO TABS: 150.0000 mg | ORAL_TABLET | Freq: Once | ORAL | 0 refills | Status: AC

## 2020-02-17 MED ORDER — CEFDINIR 300 MG PO CAPS: 300.0000 mg | ORAL_CAPSULE | Freq: Two times a day (BID) | ORAL | 0 refills | Status: AC

## 2020-02-17 NOTE — Progress Notes (Signed)
Virtual Visit via Telephone Note  I connected with Sara [name] on 02/17/20 at 4:56 PM by telephone and verified that I am speaking with the correct person using two identifiers. Sara [name] is currently located at home and her husband is currently with her during this visit. The provider, Loman Brooklyn, FNP is located in their office at time of visit.  I discussed the limitations, risks, security and privacy concerns of performing an evaluation and management service by telephone and the availability of in person appointments. I also discussed with the patient that there may be a patient responsible charge related to this service. The patient expressed understanding and agreed to proceed.  Subjective: PCP: Janora Norlander, DO  Chief Complaint  Patient presents with   Urinary Tract Infection   Urinary Tract Infection: Patient complains of dysuria, frequency and inability to void. She has had symptoms for 1 week. Patient denies back pain and fever. Patient does have a history of recurrent UTI.  Patient does not have a history of pyelonephritis. She tried to treat herself at home with Azo and a partial prescription of Ampicillin and reports it was getting better but when she ran out of antibiotics it started getting bad again.    ROS: Per HPI  Current Outpatient Medications:    albuterol (VENTOLIN HFA) 108 (90 Base) MCG/ACT inhaler, Inhale 2 puffs into the lungs every 6 (six) hours as needed for wheezing or shortness of breath., Disp: 18 g, Rfl: 1   ALPRAZolam (XANAX) 0.5 MG tablet, TAKE 1 TABLET BY MOUTH TWICE A DAY AS NEEDED FOR ANXIETY, Disp: 60 tablet, Rfl: 0   aspirin EC 81 MG tablet, Take 81 mg by mouth daily., Disp: , Rfl:    atorvastatin (LIPITOR) 80 MG tablet, Take 1 tablet (80 mg total) by mouth daily., Disp: 90 tablet, Rfl: 3   budesonide-formoterol (SYMBICORT) 80-4.5 MCG/ACT inhaler, Inhale 2 puffs into the lungs 2 (two) times daily., Disp: 1 Inhaler,  Rfl: 3   Calcium Carbonate-Vitamin D (SUPER CALCIUM 600 + D3 PO), Take 1 tablet by mouth daily., Disp: , Rfl:    cyclobenzaprine (FLEXERIL) 5 MG tablet, TAKE 1 TABLET BY MOUTH THREE TIMES A DAY AS NEEDED FOR MUSCLE SPASMS, Disp: 30 tablet, Rfl: 0   diclofenac Sodium (VOLTAREN) 1 % GEL, Voltaren 1 % topical gel, Disp: , Rfl:    diltiazem (CARDIZEM CD) 120 MG 24 hr capsule, TAKE (1) CAPSULE DAILY, Disp: 90 capsule, Rfl: 3   fluticasone (FLONASE) 50 MCG/ACT nasal spray, SPRAY 1 SPRAY INTO EACH NOSTRIL TWICE A DAY, Disp: 48 g, Rfl: 3   furosemide (LASIX) 20 MG tablet, Take 1 tablet (20 mg total) by mouth daily., Disp: 90 tablet, Rfl: 3   hydrocortisone 2.5 % ointment, Apply topically 2 (two) times daily., Disp: 30 g, Rfl: 5   hyoscyamine (LEVBID) 0.375 MG 12 hr tablet, Take 1 tablet (0.375 mg total) by mouth 2 (two) times daily., Disp: 60 tablet, Rfl: 2   losartan (COZAAR) 25 MG tablet, Take 1 tablet (25 mg total) by mouth 2 (two) times daily., Disp: 180 tablet, Rfl: 3   meclizine (ANTIVERT) 25 MG tablet, Take 1 tablet (25 mg total) by mouth 2 (two) times daily as needed for dizziness. Reported on 12/05/2015, Disp: 30 tablet, Rfl: 1   nitroGLYCERIN (NITROSTAT) 0.4 MG SL tablet, Place 1 tablet (0.4 mg total) under the tongue every 5 (five) minutes as needed for chest pain (x 3 doses). Reported on 12/05/2015, Disp:  25 tablet, Rfl: 1   omeprazole (PRILOSEC) 20 MG capsule, Take 1 capsule (20 mg total) by mouth daily., Disp: 90 capsule, Rfl: 3   potassium chloride (KLOR-CON 10) 10 MEQ tablet, Take 1 tablet (10 mEq total) by mouth daily., Disp: 90 tablet, Rfl: 3   spironolactone (ALDACTONE) 25 MG tablet, Take 0.5 tablets (12.5 mg total) by mouth 2 (two) times daily., Disp: 90 tablet, Rfl: 3  Allergies  Allergen Reactions   Nifedipine Other (See Comments)    Brings blood pressure up really fast PROCARDIA   Tape     bleeding   Codeine Itching   Crestor [Rosuvastatin] Other (See Comments)     myalgias   Past Medical History:  Diagnosis Date   Arthritis    Arthritis -hands -Bil. knee replacemnts   AVNRT (AV nodal re-entry tachycardia) ([place])    s/p RFCA 09/2011   Bronchitis    none recent   CAD (coronary artery disease)    a. s/p Xience DES x 3 to LAD;  b. nuc study 02/27/10: EF 67% no ischemia;  c.  echo 11/12: Mild LVH, EF 94-17%, grade 1 diastolic dysfunction, moderate LAE;  d.  LHC 07/03/11: LAD stents patent, RCA 25%, EF 55-65% ;  e. [place]. Myoview 5/14:  No ischemia, EF 68%   Diverticular disease    GERD (gastroesophageal reflux disease)    Hemorrhoid    Hepatitis    yellow jaundice as a child    HTN (hypertension)    Hx of echocardiogram    a.  Echo (05/2011):  Mild LVH, EF 55-60%, Gr 1 DD, mod LAE.b. Echo (11/14):  Mild LVH, mild focal basal septal hypertrophy, EF 55-60%, Gr 1 DD, mild LAE, atrial septal aneurysm   Hyperlipidemia    Hypertension    Impaired hearing    bilateral- no hearing aids   Jaundice    Myocardial infarction (HCC)    hx of x 2    Obesity    OSA (obstructive sleep apnea)    needs no cpap per patient    Other psoriasis    ongoing- none at present.   Vertigo     Observations/Objective: A&O  No respiratory distress or wheezing audible over the phone Mood, judgement, and thought processes all WNL   Assessment and Plan: 1. Suspected UTI - cefdinir (OMNICEF) 300 MG capsule; Take 1 capsule (300 mg total) by mouth 2 (two) times daily for 10 days.  Dispense: 20 capsule; Refill: 0  2. UTI symptoms - Urine Culture - Urinalysis, Complete: Urine dipstick shows positive for leukocytes.  Micro exam: 0-5 WBC's per HPF and no bacteria. - Microscopic Examination  3. Antibiotic-induced yeast infection - fluconazole (DIFLUCAN) 150 MG tablet; Take 1 tablet (150 mg total) by mouth once for 1 dose. May repeat after 3 days if needed.  Dispense: 2 tablet; Refill: 0   Follow Up Instructions: I discussed the assessment and  treatment plan with the patient. The patient was provided an opportunity to ask questions and all were answered. The patient agreed with the plan and demonstrated an understanding of the instructions.   The patient was advised to call back or seek an in-person evaluation if the symptoms worsen or if the condition fails to improve as anticipated.  The above assessment and management plan was discussed with the patient. The patient verbalized understanding of and has agreed to the management plan. Patient is aware to call the clinic if symptoms persist or worsen. Patient is aware when to return  to the clinic for a follow-up visit. Patient educated on when it is appropriate to go to the emergency department.   Time call ended: 5:02 PM  I provided 8 minutes of non-face-to-face time during this encounter.  Hendricks Limes, MSN, APRN, FNP-C [facility] 02/17/20

## 2020-02-19 ENCOUNTER — Other Ambulatory Visit: Payer: Self-pay | Admitting: *Deleted

## 2020-02-19 DIAGNOSIS — E782 Mixed hyperlipidemia: Secondary | ICD-10-CM

## 2020-02-19 DIAGNOSIS — I10 Essential (primary) hypertension: Secondary | ICD-10-CM

## 2020-02-19 LAB — URINE CULTURE

## 2020-02-19 MED ORDER — ATORVASTATIN CALCIUM 80 MG PO TABS: 80.0000 mg | ORAL_TABLET | Freq: Every day | ORAL | 0 refills | Status: DC

## 2020-02-19 MED ORDER — DILTIAZEM HCL ER COATED BEADS 120 MG PO CP24: ORAL_CAPSULE | ORAL | 0 refills | Status: DC

## 2020-02-22 ENCOUNTER — Other Ambulatory Visit: Payer: Self-pay | Admitting: *Deleted

## 2020-02-22 DIAGNOSIS — J019 Acute sinusitis, unspecified: Secondary | ICD-10-CM

## 2020-02-22 MED ORDER — FLUTICASONE PROPIONATE 50 MCG/ACT NA SUSP: NASAL | 1 refills | Status: DC

## 2020-02-26 ENCOUNTER — Other Ambulatory Visit: Payer: Self-pay | Admitting: *Deleted

## 2020-02-26 MED ORDER — LOSARTAN POTASSIUM 25 MG PO TABS: 25.0000 mg | ORAL_TABLET | Freq: Two times a day (BID) | ORAL | 0 refills | Status: DC

## 2020-02-29 ENCOUNTER — Other Ambulatory Visit: Payer: Self-pay | Admitting: *Deleted

## 2020-02-29 MED ORDER — POTASSIUM CHLORIDE ER 10 MEQ PO TBCR: 10.0000 meq | EXTENDED_RELEASE_TABLET | Freq: Every day | ORAL | 0 refills | Status: DC

## 2020-03-14 ENCOUNTER — Other Ambulatory Visit: Payer: Self-pay

## 2020-03-14 MED ORDER — OMEPRAZOLE 20 MG PO CPDR: 20.0000 mg | DELAYED_RELEASE_CAPSULE | Freq: Every day | ORAL | 1 refills | Status: DC

## 2020-03-16 ENCOUNTER — Other Ambulatory Visit: Payer: Self-pay | Admitting: *Deleted

## 2020-03-16 DIAGNOSIS — R600 Localized edema: Secondary | ICD-10-CM

## 2020-03-16 MED ORDER — SPIRONOLACTONE 25 MG PO TABS: 12.5000 mg | ORAL_TABLET | Freq: Two times a day (BID) | ORAL | 0 refills | Status: DC

## 2020-03-23 ENCOUNTER — Other Ambulatory Visit: Payer: Self-pay | Admitting: Family Medicine

## 2020-03-23 DIAGNOSIS — R600 Localized edema: Secondary | ICD-10-CM

## 2020-03-23 NOTE — Telephone Encounter (Signed)
LMOVM refill was sent to pharmacy on 03/16/20 #[phone]

## 2020-03-23 NOTE — Telephone Encounter (Incomplete)
  Prescription Request  03/23/2020  What is the name of the medication or equipment? spironolactone (ALDACTONE) 25 MG tablet  Have you contacted your pharmacy to request a refill? (if applicable) ***  Which pharmacy would you [facility] this sent to? [facility]   Patient notified that their request is being sent to the clinical staff for review and that they should receive a response within 2 business days.

## 2020-04-01 ENCOUNTER — Other Ambulatory Visit: Payer: Self-pay | Admitting: Family Medicine

## 2020-04-01 DIAGNOSIS — I10 Essential (primary) hypertension: Secondary | ICD-10-CM

## 2020-04-01 DIAGNOSIS — R600 Localized edema: Secondary | ICD-10-CM

## 2020-04-06 ENCOUNTER — Other Ambulatory Visit: Payer: Self-pay

## 2020-04-06 ENCOUNTER — Ambulatory Visit (INDEPENDENT_AMBULATORY_CARE_PROVIDER_SITE_OTHER): Payer: Medicare HMO

## 2020-04-06 DIAGNOSIS — M810 Age-related osteoporosis without current pathological fracture: Secondary | ICD-10-CM | POA: Diagnosis not present

## 2020-04-07 DIAGNOSIS — Z780 Asymptomatic menopausal state: Secondary | ICD-10-CM | POA: Diagnosis not present

## 2020-04-13 ENCOUNTER — Telehealth: Payer: Self-pay | Admitting: *Deleted

## 2020-04-13 NOTE — Telephone Encounter (Signed)
-----   Message from Janora Norlander, DO sent at 04/13/2020  5:44 PM EDT ----- Please contact patient with Dexa results. T score -2.7.  This indicates osteoporosis.  Would like to see her in office to check Vit D and discuss treatment options.  Please schedule appt.

## 2020-04-13 NOTE — Telephone Encounter (Signed)
Left message with patient's husband for patient to call back for Dexa scan results

## 2020-04-15 ENCOUNTER — Telehealth: Payer: Self-pay

## 2020-04-15 ENCOUNTER — Telehealth: Payer: Self-pay | Admitting: Family Medicine

## 2020-04-15 ENCOUNTER — Other Ambulatory Visit: Payer: Self-pay

## 2020-04-15 ENCOUNTER — Other Ambulatory Visit: Payer: Medicare HMO

## 2020-04-15 DIAGNOSIS — M810 Age-related osteoporosis without current pathological fracture: Secondary | ICD-10-CM

## 2020-04-15 NOTE — Telephone Encounter (Signed)
Called patient; left message to call back.

## 2020-04-15 NOTE — Telephone Encounter (Signed)
Pt rc for dexa results.

## 2020-04-15 NOTE — Telephone Encounter (Signed)
-----   Message from Janora Norlander, DO sent at 04/13/2020  5:44 PM EDT ----- Please contact patient with Dexa results. T score -2.7.  This indicates osteoporosis.  Would like to see her in office to check Vit D and discuss treatment options.  Please schedule appt.

## 2020-04-15 NOTE — Telephone Encounter (Signed)
Called patient and informed of results.

## 2020-04-16 LAB — VITAMIN D 25 HYDROXY (VIT D DEFICIENCY, FRACTURES): Vit D, 25-Hydroxy: 42.2 ng/mL (ref 30.0–100.0)

## 2020-04-19 ENCOUNTER — Ambulatory Visit (INDEPENDENT_AMBULATORY_CARE_PROVIDER_SITE_OTHER): Payer: Medicare HMO | Admitting: Pharmacist

## 2020-04-19 DIAGNOSIS — E119 Type 2 diabetes mellitus without complications: Secondary | ICD-10-CM | POA: Diagnosis not present

## 2020-04-19 MED ORDER — ALENDRONATE SODIUM 70 MG PO TABS: 70.0000 mg | ORAL_TABLET | ORAL | 11 refills | Status: DC

## 2020-04-19 NOTE — Progress Notes (Signed)
    04/19/2020 Name: Sara [name] MRN: [id number] DOB: [birth year]  S:  22yoF Presents for osteoporosis evaluation, education, and management Patient was referred and last seen by Primary Care Provider on 02/17/20.  Current Height:   5'4"     Max Lifetime Height:  5'6" Current Weight:   256lbs      Ethnicity:Caucasian   HPI: Does pt already have a diagnosis of:  Osteoporosis?  Yes  Back Pain?  Yes       Kyphosis?  No Prior fracture?  No Med(s) for Osteoporosis/Osteopenia:  Calcium/vitD Med(s) previously tried for Osteoporosis/Osteopenia:  n/a                                                             PMH: Age at menopause:  n/a Hysterectomy?  Yes Oophorectomy?  No HRT? No Steroid Use?  No Thyroid med?  No History of cancer?  No History of digestive disorders (ie Crohn's)?  No Current or previous eating disorders?  No Last Vitamin D Result:  42 Last GFR Result: 68 (12/11/2019)   FH/SH: Family history of osteoporosis?  No Parent with history of hip fracture?  No Family history of breast cancer?  No Exercise?  No Smoking?  No Alcohol?  No    Calcium Assessment Calcium Intake  # of servings/day  Calcium mg  Milk (8 oz) 0.5  x  300  = 150  Yogurt (4 oz) 0 x  200 = 0  Cheese (1 oz) 0.3 x  200 = 60  Other Calcium sources   250mg   Ca supplement 1200mg  = 1200mg    Estimated calcium intake per day 1660mg     DEXA Results 04/06/2020 FINDINGS: AP LUMBAR SPINE L1, L3, L4  Bone Mineral Density (BMD):  0.893 g/cm2  Young Adult T-Score:  -2.4  Z-Score:  -1.8  LEFT FEMUR NECK  Bone Mineral Density (BMD):  0.816 g/cm2  Young Adult T-Score: -1.6  Z-Score:  -0.4  LEFT FOREARM (1/3 RADIUS)  Bone Mineral Density (BMD):  0.649 g/cm2  Young Adult T Score:  -2.7  Z Score:  -0.4  FRAX 10 year estimate: FRAX: World Health Organization FRAX assessment of absolute fracture risk is not calculated for this patient because the patient  has Osteoporosis.  Bone mineral density has decreased in the lumbar spine by 8.5% since 05/21/2017.  Assessment: osteoporosis  Recommendations: 1.  Start  alendronate (FOSAMAX) 70mg  weekly; GFR 68  2.  continue calcium 1200mg  daily through supplementation or diet. Continue vitamin D 3.  continue weight bearing exercise - 30 minutes at least 4 days per week.   4.  Counseled and educated about fall risk and prevention.  Recheck DEXA:  2 years  Time spent counseling patient:  15 minutes   Regina Eck, PharmD, BCPS Clinical Pharmacist, [place]  [facility] Phone [phone]

## 2020-05-02 ENCOUNTER — Telehealth: Payer: Self-pay

## 2020-05-12 NOTE — Telephone Encounter (Signed)
Called patient back regarding medications Reviewed fosamax Patient verbalizes understanding

## 2020-05-18 ENCOUNTER — Other Ambulatory Visit: Payer: Self-pay | Admitting: Family Medicine

## 2020-05-18 DIAGNOSIS — I10 Essential (primary) hypertension: Secondary | ICD-10-CM

## 2020-05-18 DIAGNOSIS — E782 Mixed hyperlipidemia: Secondary | ICD-10-CM

## 2020-05-24 ENCOUNTER — Ambulatory Visit: Payer: Medicare HMO

## 2020-05-25 ENCOUNTER — Ambulatory Visit (INDEPENDENT_AMBULATORY_CARE_PROVIDER_SITE_OTHER): Payer: Medicare HMO

## 2020-05-25 ENCOUNTER — Other Ambulatory Visit: Payer: Self-pay

## 2020-05-25 DIAGNOSIS — Z23 Encounter for immunization: Secondary | ICD-10-CM

## 2020-05-26 DIAGNOSIS — H52203 Unspecified astigmatism, bilateral: Secondary | ICD-10-CM | POA: Diagnosis not present

## 2020-05-26 DIAGNOSIS — Z961 Presence of intraocular lens: Secondary | ICD-10-CM | POA: Diagnosis not present

## 2020-06-11 ENCOUNTER — Other Ambulatory Visit: Payer: Self-pay | Admitting: Family Medicine

## 2020-06-11 DIAGNOSIS — E782 Mixed hyperlipidemia: Secondary | ICD-10-CM

## 2020-06-11 DIAGNOSIS — I10 Essential (primary) hypertension: Secondary | ICD-10-CM

## 2020-06-15 ENCOUNTER — Other Ambulatory Visit: Payer: Self-pay | Admitting: Family Medicine

## 2020-06-15 DIAGNOSIS — R600 Localized edema: Secondary | ICD-10-CM

## 2020-06-30 ENCOUNTER — Encounter: Payer: Self-pay | Admitting: Family Medicine

## 2020-06-30 ENCOUNTER — Ambulatory Visit (INDEPENDENT_AMBULATORY_CARE_PROVIDER_SITE_OTHER): Payer: Medicare HMO | Admitting: Family Medicine

## 2020-06-30 DIAGNOSIS — J40 Bronchitis, not specified as acute or chronic: Secondary | ICD-10-CM

## 2020-06-30 DIAGNOSIS — J329 Chronic sinusitis, unspecified: Secondary | ICD-10-CM

## 2020-06-30 MED ORDER — PREDNISONE 10 MG PO TABS: ORAL_TABLET | ORAL | 0 refills | Status: DC

## 2020-06-30 MED ORDER — AMOXICILLIN-POT CLAVULANATE 875-125 MG PO TABS: 1.0000 | ORAL_TABLET | Freq: Two times a day (BID) | ORAL | 0 refills | Status: DC

## 2020-06-30 NOTE — Progress Notes (Signed)
Subjective:    Patient ID: Sara [name], female    DOB: 12-01-1945, 74 y.o.   MRN: [id number]   HPI: Sara [name] is a 74 y.o. female presenting for cough. Head and ears stopped up. PND with hacking up purulent phlegm. More dyspneic than usual. Not using the ventolin MDI.  Cough is productive of copious purulent sputum.    Depression screen [facility] 2/9 02/03/2019 01/28/2019 08/07/2018 04/08/2018 12/24/2017  Decreased Interest 0 0 0 2 0  Down, Depressed, Hopeless 0 0 0 2 0  PHQ - 2 Score 0 0 0 4 0  Altered sleeping - - 2 3 -  Tired, decreased energy - - 3 2 -  Change in appetite - - 0 1 -  Feeling bad or failure about yourself  - - 0 0 -  Trouble concentrating - - 0 2 -  Moving slowly or fidgety/restless - - 0 1 -  Suicidal thoughts - - 0 0 -  PHQ-9 Score - - 5 13 -  Difficult doing work/chores - - Somewhat difficult - -  Some recent data might be hidden     Relevant past medical, surgical, family and social history reviewed and updated as indicated.  Interim medical history since our last visit reviewed. Allergies and medications reviewed and updated.  ROS:  Review of Systems  Constitutional: Negative for activity change, appetite change, chills and fever.  HENT: Positive for congestion, postnasal drip, rhinorrhea and sinus pressure. Negative for ear discharge, ear pain, hearing loss, nosebleeds, sneezing and trouble swallowing.   Respiratory: Positive for cough and shortness of breath. Negative for chest tightness.   Cardiovascular: Negative for chest pain and palpitations.  Skin: Negative for rash.     Social History   Tobacco Use  Smoking Status Never Smoker  Smokeless Tobacco Never Used       Objective:     Wt Readings from Last 3 Encounters:  12/11/19 256 lb 6.4 oz (116.3 kg)  02/03/19 250 lb 3.2 oz (113.5 kg)  01/28/19 249 lb 3.2 oz (113 kg)     Exam deferred. Pt. Harboring due to COVID 19. Phone visit performed.   Assessment & Plan:   1.  Sinobronchitis     Meds ordered this encounter  Medications  . amoxicillin-clavulanate (AUGMENTIN) 875-125 MG tablet    Sig: Take 1 tablet by mouth 2 (two) times daily. Take all of this medication    Dispense:  20 tablet    Refill:  0  . predniSONE (DELTASONE) 10 MG tablet    Sig: Take 5 daily for 2 days followed by 4,3,2 and 1 for 2 days each.    Dispense:  30 tablet    Refill:  0    No orders of the defined types were placed in this encounter.     Diagnoses and all orders for this visit:  Sinobronchitis  Other orders -     amoxicillin-clavulanate (AUGMENTIN) 875-125 MG tablet; Take 1 tablet by mouth 2 (two) times daily. Take all of this medication -     predniSONE (DELTASONE) 10 MG tablet; Take 5 daily for 2 days followed by 4,3,2 and 1 for 2 days each.    Virtual Visit via telephone Note  I discussed the limitations, risks, security and privacy concerns of performing an evaluation and management service by telephone and the availability of in person appointments. The patient was identified with two identifiers. Pt.expressed understanding and agreed to proceed. Pt. Is at home. Dr. Livia Snellen  is in his office.  Follow Up Instructions:   I discussed the assessment and treatment plan with the patient. The patient was provided an opportunity to ask questions and all were answered. The patient agreed with the plan and demonstrated an understanding of the instructions.   The patient was advised to call back or seek an in-person evaluation if the symptoms worsen or if the condition fails to improve as anticipated.   Total minutes including chart review and phone contact time: 17   Follow up plan: Return if symptoms worsen or fail to improve.  Claretta Fraise, MD [place]

## 2020-07-01 ENCOUNTER — Other Ambulatory Visit: Payer: Self-pay | Admitting: Family Medicine

## 2020-07-01 DIAGNOSIS — R600 Localized edema: Secondary | ICD-10-CM

## 2020-07-01 DIAGNOSIS — I10 Essential (primary) hypertension: Secondary | ICD-10-CM

## 2020-07-12 ENCOUNTER — Telehealth: Payer: Self-pay

## 2020-07-12 MED ORDER — FLUCONAZOLE 150 MG PO TABS: 150.0000 mg | ORAL_TABLET | Freq: Once | ORAL | 0 refills | Status: AC

## 2020-07-12 NOTE — Telephone Encounter (Signed)
Pt c/o vaginal burning. She denies itching or an increase in discharge. She states that she has a h/o yeast infections after taking an ATB.

## 2020-07-12 NOTE — Telephone Encounter (Signed)
Pt informed

## 2020-07-12 NOTE — Telephone Encounter (Signed)
Diflucan sent to pharmacy.

## 2020-07-27 ENCOUNTER — Other Ambulatory Visit: Payer: Self-pay | Admitting: Family Medicine

## 2020-07-27 DIAGNOSIS — R600 Localized edema: Secondary | ICD-10-CM

## 2020-07-27 DIAGNOSIS — I10 Essential (primary) hypertension: Secondary | ICD-10-CM

## 2020-08-15 ENCOUNTER — Telehealth: Payer: Self-pay

## 2020-08-15 DIAGNOSIS — R600 Localized edema: Secondary | ICD-10-CM

## 2020-08-15 MED ORDER — SPIRONOLACTONE 25 MG PO TABS: 12.5000 mg | ORAL_TABLET | Freq: Two times a day (BID) | ORAL | 0 refills | Status: DC

## 2020-08-15 NOTE — Telephone Encounter (Signed)
Aware refill sent to [facility]

## 2020-08-23 ENCOUNTER — Other Ambulatory Visit: Payer: Self-pay

## 2020-08-23 ENCOUNTER — Ambulatory Visit (INDEPENDENT_AMBULATORY_CARE_PROVIDER_SITE_OTHER): Payer: Medicare HMO | Admitting: Family Medicine

## 2020-08-23 ENCOUNTER — Encounter: Payer: Self-pay | Admitting: Family Medicine

## 2020-08-23 VITALS — BP 112/69 | HR 70 | Temp 97.7°F | Ht 64.0 in | Wt 255.0 lb

## 2020-08-23 DIAGNOSIS — J40 Bronchitis, not specified as acute or chronic: Secondary | ICD-10-CM

## 2020-08-23 DIAGNOSIS — E785 Hyperlipidemia, unspecified: Secondary | ICD-10-CM | POA: Diagnosis not present

## 2020-08-23 DIAGNOSIS — E119 Type 2 diabetes mellitus without complications: Secondary | ICD-10-CM | POA: Diagnosis not present

## 2020-08-23 DIAGNOSIS — M810 Age-related osteoporosis without current pathological fracture: Secondary | ICD-10-CM | POA: Diagnosis not present

## 2020-08-23 DIAGNOSIS — I152 Hypertension secondary to endocrine disorders: Secondary | ICD-10-CM | POA: Diagnosis not present

## 2020-08-23 DIAGNOSIS — E1159 Type 2 diabetes mellitus with other circulatory complications: Secondary | ICD-10-CM

## 2020-08-23 DIAGNOSIS — L409 Psoriasis, unspecified: Secondary | ICD-10-CM

## 2020-08-23 DIAGNOSIS — E1169 Type 2 diabetes mellitus with other specified complication: Secondary | ICD-10-CM

## 2020-08-23 DIAGNOSIS — Z7985 Long-term (current) use of injectable non-insulin antidiabetic drugs: Secondary | ICD-10-CM | POA: Insufficient documentation

## 2020-08-23 LAB — CMP14+EGFR
ALT: 12 IU/L (ref 0–32)
AST: 16 IU/L (ref 0–40)
Albumin/Globulin Ratio: 1.5 (ref 1.2–2.2)
Albumin: 3.7 g/dL (ref 3.7–4.7)
Alkaline Phosphatase: 72 IU/L (ref 44–121)
BUN/Creatinine Ratio: 15 (ref 12–28)
BUN: 11 mg/dL (ref 8–27)
Bilirubin Total: 1.1 mg/dL (ref 0.0–1.2)
CO2: 20 mmol/L (ref 20–29)
Calcium: 8.9 mg/dL (ref 8.7–10.3)
Chloride: 105 mmol/L (ref 96–106)
Creatinine, Ser: 0.75 mg/dL (ref 0.57–1.00)
GFR calc Af Amer: 90 mL/min/{1.73_m2} (ref >59)
GFR calc non Af Amer: 78 mL/min/{1.73_m2} (ref >59)
Globulin, Total: 2.4 g/dL (ref 1.5–4.5)
Glucose: 141 mg/dL — ABNORMAL HIGH (ref 65–99)
Potassium: 4.2 mmol/L (ref 3.5–5.2)
Sodium: 140 mmol/L (ref 134–144)
Total Protein: 6.1 g/dL (ref 6.0–8.5)

## 2020-08-23 LAB — BAYER DCA HB A1C WAIVED: HB A1C (BAYER DCA - WAIVED): 6.5 % (ref <7.0)

## 2020-08-23 MED ORDER — FUROSEMIDE 20 MG PO TABS: 20.0000 mg | ORAL_TABLET | Freq: Every day | ORAL | 0 refills | Status: DC

## 2020-08-23 MED ORDER — SPIRONOLACTONE 25 MG PO TABS: 12.5000 mg | ORAL_TABLET | Freq: Two times a day (BID) | ORAL | 3 refills | Status: DC

## 2020-08-23 MED ORDER — ATORVASTATIN CALCIUM 80 MG PO TABS: 80.0000 mg | ORAL_TABLET | ORAL | 3 refills | Status: DC

## 2020-08-23 MED ORDER — POTASSIUM CHLORIDE ER 10 MEQ PO TBCR: 10.0000 meq | EXTENDED_RELEASE_TABLET | Freq: Every day | ORAL | 3 refills | Status: DC

## 2020-08-23 MED ORDER — BUDESONIDE-FORMOTEROL FUMARATE 80-4.5 MCG/ACT IN AERO: 2.0000 | INHALATION_SPRAY | Freq: Two times a day (BID) | RESPIRATORY_TRACT | 3 refills | Status: AC

## 2020-08-23 MED ORDER — OMEPRAZOLE 20 MG PO CPDR: 20.0000 mg | DELAYED_RELEASE_CAPSULE | Freq: Every day | ORAL | 1 refills | Status: DC

## 2020-08-23 MED ORDER — NITROGLYCERIN 0.4 MG SL SUBL: 0.4000 mg | SUBLINGUAL_TABLET | SUBLINGUAL | 1 refills | Status: AC | PRN

## 2020-08-23 MED ORDER — DILTIAZEM HCL ER COATED BEADS 120 MG PO CP24: 120.0000 mg | ORAL_CAPSULE | Freq: Every day | ORAL | 3 refills | Status: DC

## 2020-08-23 MED ORDER — BETAMETHASONE VALERATE 0.1 % EX OINT: 1.0000 "application " | TOPICAL_OINTMENT | Freq: Two times a day (BID) | CUTANEOUS | 0 refills | Status: DC | PRN

## 2020-08-23 MED ORDER — ALBUTEROL SULFATE HFA 108 (90 BASE) MCG/ACT IN AERS: 2.0000 | INHALATION_SPRAY | Freq: Four times a day (QID) | RESPIRATORY_TRACT | 1 refills | Status: DC | PRN

## 2020-08-23 NOTE — Patient Instructions (Signed)
Betamethasone added for psoriasis.  Ok to use up to twice daily as needed for psoriasis rash.  Diabetes Mellitus and Standards of [place] with and managing diabetes (diabetes mellitus) can be complicated. Your diabetes treatment may be managed by a team of health care providers, including:  A physician who specializes in diabetes (endocrinologist). You might also have visits with a nurse practitioner or physician assistant.  Nurses.  A registered dietitian.  A certified diabetes care and education specialist.  An exercise specialist.  A pharmacist.  An eye doctor.  A foot specialist (podiatrist).  A dental care provider.  A primary care provider.  A mental health care provider. How to manage your diabetes You can do many things to successfully manage your diabetes. Your health care providers will follow guidelines to help you get the best quality of care. Here are general guidelines for your diabetes management plan. Your health care providers may give you more specific instructions. Physical exams When you are diagnosed with diabetes, and each year after that, your health care provider will ask about your medical and family history. You will have a physical exam, which may include:  Measuring your height, weight, and body mass index (BMI).  Checking your blood pressure. This will be done at every routine medical visit. Your target blood pressure may vary depending on your medical conditions, your age, and other factors.  A thyroid exam.  A skin exam.  Screening for nerve damage (peripheral neuropathy). This may include checking the pulse in your legs and feet and the level of sensation in your hands and feet.  A foot exam to inspect the structure and skin of your feet, including checking for cuts, bruises, redness, blisters, sores, or other problems.  Screening for blood vessel (vascular) problems. This may include checking the pulse in your legs and feet and  checking your temperature. Blood tests Depending on your treatment plan and your personal needs, you may have the following tests:  Hemoglobin A1C (HbA1C). This test provides information about blood sugar (glucose) control over the previous 2-3 months. It is used to adjust your treatment plan, if needed. This test will be done: ? At least 2 times a year, if you are meeting your treatment goals. ? 4 times a year, if you are not meeting your treatment goals or if your goals have changed.  Lipid testing, including total cholesterol, LDL and HDL cholesterol, and triglyceride levels. ? The goal for LDL is less than 100 mg/dL (5.5 mmol/L). If you are at high risk for complications, the goal is less than 70 mg/dL (3.9 mmol/L). ? The goal for HDL is 40 mg/dL (2.2 mmol/L) or higher for men, and 50 mg/dL (2.8 mmol/L) or higher for women. An HDL cholesterol of 60 mg/dL (3.3 mmol/L) or higher gives some protection against heart disease. ? The goal for triglycerides is less than 150 mg/dL (8.3 mmol/L).  Liver function tests.  Kidney function tests.  Thyroid function tests.   Dental and eye exams  Visit your dentist two times a year.  If you have type 1 diabetes, your health care provider may recommend an eye exam within 5 years after you are diagnosed, and then once a year after your first exam. ? For children with type 1 diabetes, the health care provider may recommend an eye exam when your child is age 83 or older and has had diabetes for 3-5 years. After the first exam, your child should get an eye exam once a  year.  If you have type 2 diabetes, your health care provider may recommend an eye exam as soon as you are diagnosed, and then every 1-2 years after your first exam.   Immunizations  A yearly flu (influenza) vaccine is recommended annually for everyone 6 months or older. This is especially important if you have diabetes.  The pneumonia (pneumococcal) vaccine is recommended for everyone 2  years or older who has diabetes. If you are age 11 or older, you may get the pneumonia vaccine as a series of two separate shots.  The hepatitis B vaccine is recommended for adults shortly after being diagnosed with diabetes. Adults and children with diabetes should receive all other vaccines according to age-specific recommendations from the Centers for Disease Control and Prevention (CDC). Mental and emotional health Screening for symptoms of eating disorders, anxiety, and depression is recommended at the time of diagnosis and after as needed. If your screening shows that you have symptoms, you may need more evaluation. You may work with a mental health care provider. Follow these instructions at home: Treatment plan You will monitor your blood glucose levels and may give yourself insulin. Your treatment plan will be reviewed at every medical visit. You and your health care provider will discuss:  How you are taking your medicines, including insulin.  Any side effects you have.  Your blood glucose level target goals.  How often you monitor your blood glucose level.  Lifestyle habits, such as activity level and tobacco, alcohol, and substance use. Education Your health care provider will assess how well you are monitoring your blood glucose levels and whether you are taking your insulin and medicines correctly. He or she may refer you to:  A certified diabetes care and education specialist to manage your diabetes throughout your life, starting at diagnosis.  A registered dietitian who can create and review your personal nutrition plan.  An exercise specialist who can discuss your activity level and exercise plan. General instructions  Take over-the-counter and prescription medicines only as told by your health care provider.  Keep all follow-up visits. This is important. Where to find support There are many diabetes support networks, including:  American Diabetes Association  (ADA): diabetes.org  Defeat Diabetes Foundation: defeatdiabetes.org Where to find more information  American Diabetes Association (ADA): www.diabetes.org  Association of Diabetes Care & Education Specialists (ADCES): diabeteseducator.org  International Diabetes Federation (IDF): https://www.munoz-bell.org/ Summary  Managing diabetes (diabetes mellitus) can be complicated. Your diabetes treatment may be managed by a team of health care providers.  Your health care providers follow guidelines to help you get the best quality care.  You should have physical exams, blood tests, blood pressure monitoring, immunizations, and screening tests regularly. Stay updated on how to manage your diabetes.  Your health care providers may also give you more specific instructions based on your individual health. This information is not intended to replace advice given to you by your health care provider. Make sure you discuss any questions you have with your health care provider. Document Revised: 01/14/2020 Document Reviewed: 01/14/2020 Elsevier Patient Education  [place].

## 2020-08-23 NOTE — Progress Notes (Signed)
Subjective: CC: new onset DM PCP: Raliegh Ip, DO WUJ:[id number] Sara [name] is a 75 y.o. female presenting to clinic today for:  1. Type 2 Diabetes with hypertension, hyperlipidemia:  New onset type 2 diabetes after having to A1c is at 6.5.  She is currently not on any medications for her sugar.  She is on atorvastatin 80 mg every other day.  She is unable take daily due to myalgias.  She is compliant with her Cardizem, Cozaar, Aldactone and potassium.  She is had no issues with hyperkalemia or abnormal EKGs due to electrolytes in the past.  She is managed by Dr. Antoine Poche but has not seen him in the last year.  She uses Lasix for swelling on a fairly regular basis.  Denies any chest pain, visual disturbance.  Sometimes she has shortness of breath but this has been present since she was sick with COVID in November 2020.  She uses her Symbicort and albuterol intermittently for this and requests a refill today.  Last eye exam: Up-to-date but did not inform them that she was a diabetic so formal retinal exam has not been performed Last foot exam: Needs today Last A1c:  Lab Results  Component Value Date   HGBA1C 6.5 12/11/2019   Nephropathy screen indicated?:  On ARB Last flu, zoster and/or pneumovax:  Immunization History  Administered Date(s) Administered  . Fluad Quad(high Dose 65+) 05/04/2019, 05/25/2020  . Influenza Whole 04/22/2009  . Influenza, High Dose Seasonal PF 05/01/2018  . Influenza,inj,Quad PF,6+ Mos 04/25/2015  . Influenza-Unspecified 05/18/2014, 07/05/2016, 05/13/2017  . Moderna Sars-Covid-2 Vaccination 10/12/2019, 11/09/2019  . Pneumococcal Conjugate-13 06/15/2014  . Pneumococcal Polysaccharide-23 04/22/2008, 05/04/2019  . Tdap 04/22/2013  . Zoster 08/03/2010   2. Osteoporosis Compliant with Fosamax.  Last DEXA scan was in 03/2020.  Vitamin D and calcium are normal.  No formal weightbearing exercises.  Unrestricted diet.  3.  Psoriasis Patient reports psoriatic  plaques along the left elbow and right arch of her foot.  She uses over-the-counter products but notes that the symptoms persist.  The prednisone patient is on for her upper respiratory infection recently actually was very helpful but the flares continue to occur intermittently, sometimes being absent for up to 3 months.  She has current flare now  ROS: Per HPI  Allergies  Allergen Reactions  . Nifedipine Other (See Comments)    Brings blood pressure up really fast PROCARDIA  . Tape     bleeding  . Codeine Itching  . Crestor [Rosuvastatin] Other (See Comments)    myalgias   Past Medical History:  Diagnosis Date  . Arthritis    Arthritis -hands -Bil. knee replacemnts  . 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 Bronchitis    none recent  . CAD (coronary artery disease)    a. s/p Xience DES x 3 to LAD;  b. nuc study 02/27/10: EF 67% no ischemia;  c.  echo 11/12: Mild LVH, EF 55-65%, grade 1 diastolic dysfunction, moderate LAE;  d.  LHC 07/03/11: LAD stents patent, RCA 25%, EF 55-65% ;  e. [place]. Myoview 5/14:  No ischemia, EF 68%  . Diverticular disease   . GERD (gastroesophageal reflux disease)   . Hemorrhoid   . Hepatitis    yellow jaundice as a child   . HTN (hypertension)   . Hx of echocardiogram    a.  Echo (05/2011):  Mild LVH, EF 55-60%, Gr 1 DD, mod LAE.b. Echo (11/14):  Mild LVH, mild focal basal septal hypertrophy, EF 55-60%, Gr 1 DD, mild LAE, atrial septal aneurysm  . Hyperlipidemia   . Hypertension   . Impaired hearing    bilateral- no hearing aids  . Jaundice   . Myocardial infarction (HCC)    hx of x 2   . Obesity   . OSA (obstructive sleep apnea)    needs no cpap per patient   . Other psoriasis    ongoing- none at present.  . Vertigo     Current Outpatient Medications:  .  albuterol (VENTOLIN HFA) 108 (90 Base) MCG/ACT inhaler, Inhale 2 puffs into the lungs every 6 (six) hours as needed for wheezing or shortness of breath., Disp: 18  g, Rfl: 1 .  alendronate (FOSAMAX) 70 MG tablet, Take 1 tablet (70 mg total) by mouth every 7 (seven) days. Take with a full glass of water on an empty stomach., Disp: 4 tablet, Rfl: 11 .  ALPRAZolam (XANAX) 0.5 MG tablet, TAKE 1 TABLET BY MOUTH TWICE A DAY AS NEEDED FOR ANXIETY, Disp: 60 tablet, Rfl: 0 .  amoxicillin-clavulanate (AUGMENTIN) 875-125 MG tablet, Take 1 tablet by mouth 2 (two) times daily. Take all of this medication, Disp: 20 tablet, Rfl: 0 .  aspirin EC 81 MG tablet, Take 81 mg by mouth daily., Disp: , Rfl:  .  atorvastatin (LIPITOR) 80 MG tablet, TAKE 1 TABLET (80 MG TOTAL) BY MOUTH DAILY. (NEEDS TO BE SEEN BEFORE NEXT REFILL), Disp: 30 tablet, Rfl: 0 .  budesonide-formoterol (SYMBICORT) 80-4.5 MCG/ACT inhaler, Inhale 2 puffs into the lungs 2 (two) times daily., Disp: 1 Inhaler, Rfl: 3 .  Calcium Carbonate-Vitamin D (SUPER CALCIUM 600 + D3 PO), Take 1 tablet by mouth daily., Disp: , Rfl:  .  cyclobenzaprine (FLEXERIL) 5 MG tablet, TAKE 1 TABLET BY MOUTH THREE TIMES A DAY AS NEEDED FOR MUSCLE SPASMS, Disp: 30 tablet, Rfl: 0 .  diclofenac Sodium (VOLTAREN) 1 % GEL, Voltaren 1 % topical gel, Disp: , Rfl:  .  diltiazem (CARDIZEM CD) 120 MG 24 hr capsule, TAKE 1 CAPSULE BY MOUTH EVERY DAY (NEEDS TO BE SEEN BEFORE NEXT REFILL), Disp: 30 capsule, Rfl: 0 .  fluticasone (FLONASE) 50 MCG/ACT nasal spray, SPRAY 1 SPRAY INTO EACH NOSTRIL TWICE A DAY, Disp: 48 g, Rfl: 1 .  furosemide (LASIX) 20 MG tablet, Take 1 tablet (20 mg total) by mouth daily., Disp: 30 tablet, Rfl: 0 .  hydrocortisone 2.5 % ointment, Apply topically 2 (two) times daily., Disp: 30 g, Rfl: 5 .  hyoscyamine (LEVBID) 0.375 MG 12 hr tablet, Take 1 tablet (0.375 mg total) by mouth 2 (two) times daily., Disp: 60 tablet, Rfl: 2 .  losartan (COZAAR) 25 MG tablet, Take 1 tablet (25 mg total) by mouth 2 (two) times daily., Disp: 180 tablet, Rfl: 0 .  meclizine (ANTIVERT) 25 MG tablet, Take 1 tablet (25 mg total) by mouth 2 (two) times  daily as needed for dizziness. Reported on 12/05/2015, Disp: 30 tablet, Rfl: 1 .  nitroGLYCERIN (NITROSTAT) 0.4 MG SL tablet, Place 1 tablet (0.4 mg total) under the tongue every 5 (five) minutes as needed for chest pain (x 3 doses). Reported on 12/05/2015, Disp: 25 tablet, Rfl: 1 .  omeprazole (PRILOSEC) 20 MG capsule, Take 1 capsule (20 mg total) by mouth daily., Disp: 90 capsule, Rfl: 1 .  potassium chloride (KLOR-CON 10) 10 MEQ tablet, Take 1 tablet (10 mEq total) by mouth daily., Disp: 90 tablet, Rfl: 0 .  predniSONE (DELTASONE)  10 MG tablet, Take 5 daily for 2 days followed by 4,3,2 and 1 for 2 days each., Disp: 30 tablet, Rfl: 0 .  spironolactone (ALDACTONE) 25 MG tablet, Take 0.5 tablets (12.5 mg total) by mouth 2 (two) times daily., Disp: 30 tablet, Rfl: 0 Social History   Socioeconomic History  . Marital status: Married    Spouse name: Not on file  . Number of children: 3  . Years of education: Not on file  . Highest education level: Not on file  Occupational History  . Occupation: hairdresser  Tobacco Use  . Smoking status: Never Smoker  . Smokeless tobacco: Never Used  Vaping Use  . Vaping Use: Never used  Substance and Sexual Activity  . Alcohol use: No  . Drug use: No  . Sexual activity: Not Currently    Birth control/protection: Post-menopausal  Other Topics Concern  . Not on file  Social History Narrative  . Not on file   Social Determinants of Health   Financial Resource Strain: Not on file  Food Insecurity: Not on file  Transportation Needs: Not on file  Physical Activity: Not on file  Stress: Not on file  Social Connections: Not on file  Intimate Partner Violence: Not on file   Family History  Problem Relation Age of Onset  . Aneurysm Father   . Stroke Mother   . Hypertension Mother   . Heart failure Mother   . Breast cancer Mother   . Seizures Brother   . Muscular dystrophy Daughter        dx as infant, passed age 19  . Seizures Son   . Colon  cancer Neg Hx     Objective: Office vital signs reviewed. BP 112/69   Pulse 70   Temp 97.7 F (36.5 C) (Temporal)   Ht 5\' 4"  (1.626 m)   Wt 255 lb (115.7 kg)   SpO2 95%   BMI 43.77 kg/m   Physical Examination:  General: Awake, alert, well nourished, obese. No acute distress HEENT: Normal; sclera white Cardio: regular rate and rhythm, S1S2 heard, no murmurs appreciated Pulm: clear to auscultation bilaterally, no wheezes, rhonchi or rales; normal work of breathing on room air Extremities: warm, well perfused, No edema, cyanosis or clubbing; +2 pulses bilaterally MSK: normal gait and station Skin: Scaly, psoriatic plaques noted along the right arch of the foot and left elbow Neuro: See diabetic foot exam Diabetic Foot Exam - Simple   Simple Foot Form Diabetic Foot exam was performed with the following findings: Yes 08/23/2020  8:43 AM  Visual Inspection See comments: Yes Sensation Testing Intact to touch and monofilament testing bilaterally: Yes Pulse Check Posterior Tibialis and Dorsalis pulse intact bilaterally: Yes Comments Psoriatic plaques noted along the arches of bilateral feet     Assessment/ Plan: 75 y.o. female   New onset type 2 diabetes mellitus (HCC) - Plan: Bayer DCA Hb A1c Waived  Hypertension associated with diabetes (HCC) - Plan: CMP14+EGFR, diltiazem (CARDIZEM CD) 120 MG 24 hr capsule, furosemide (LASIX) 20 MG tablet, spironolactone (ALDACTONE) 25 MG tablet  Hyperlipidemia associated with type 2 diabetes mellitus (HCC) - Plan: CMP14+EGFR, atorvastatin (LIPITOR) 80 MG tablet  Age-related osteoporosis without current pathological fracture - Plan: CMP14+EGFR  Psoriasis - Plan: betamethasone valerate ointment (VALISONE) 0.1 %  Bronchitis with wheezing - Plan: budesonide-formoterol (SYMBICORT) 80-4.5 MCG/ACT inhaler, albuterol (VENTOLIN HFA) 108 (90 Base) MCG/ACT inhaler  Her diabetes is diet controlled.  I stressed the importance of diabetic eye exam,  foot exam.  I reinforced need to restrict carbohydrates in the implications of uncontrolled blood sugars on her eyes, kidneys, nerves and ultimately cardiovascular system.  I offered a referral to dietitian today but she declined this.  She is up-to-date on pneumococcal vaccination.  Her blood pressures controlled.  Continue current regimen.  Check CMP given use of spironolactone, potassium and ARB.  She has a risk of hyperkalemia, though her Lasix probably reduces this risk some.  We will plan for fasting lipid panel at next visit.  Have updated her statin to reflect current usage of every other day due to myalgia  Continue Fosamax for osteoporosis.  Check CMP.  Plan for vitamin D at next visit  Betamethasone was prescribed for her psoriatic plaques.  I renewed her Symbicort and encouraged her to use more regularly to see if this changes her breathing patterns.  If she continues to have dyspnea on exertion, she may consider checkup again with Dr. Antoine Poche but I suspect that physical deconditioning and morbid obesity are likely playing a part.  Orders Placed This Encounter  Procedures  . Bayer DCA Hb A1c Waived  . CMP14+EGFR   Meds ordered this encounter  Medications  . betamethasone valerate ointment (VALISONE) 0.1 %    Sig: Apply 1 application topically 2 (two) times daily as needed (psoriasis). (DO NOT apply to face, groin or axilla)    Dispense:  30 g    Refill:  0  . atorvastatin (LIPITOR) 80 MG tablet    Sig: Take 1 tablet (80 mg total) by mouth every other day.    Dispense:  45 tablet    Refill:  3  . budesonide-formoterol (SYMBICORT) 80-4.5 MCG/ACT inhaler    Sig: Inhale 2 puffs into the lungs 2 (two) times daily.    Dispense:  1 each    Refill:  3  . albuterol (VENTOLIN HFA) 108 (90 Base) MCG/ACT inhaler    Sig: Inhale 2 puffs into the lungs every 6 (six) hours as needed for wheezing or shortness of breath.    Dispense:  18 g    Refill:  1  . diltiazem (CARDIZEM CD) 120 MG  24 hr capsule    Sig: Take 1 capsule (120 mg total) by mouth daily.    Dispense:  90 capsule    Refill:  3  . furosemide (LASIX) 20 MG tablet    Sig: Take 1 tablet (20 mg total) by mouth daily.    Dispense:  30 tablet    Refill:  0  . omeprazole (PRILOSEC) 20 MG capsule    Sig: Take 1 capsule (20 mg total) by mouth daily.    Dispense:  90 capsule    Refill:  1  . nitroGLYCERIN (NITROSTAT) 0.4 MG SL tablet    Sig: Place 1 tablet (0.4 mg total) under the tongue every 5 (five) minutes as needed for chest pain (x 3 doses). Reported on 12/05/2015    Dispense:  25 tablet    Refill:  1  . potassium chloride (KLOR-CON 10) 10 MEQ tablet    Sig: Take 1 tablet (10 mEq total) by mouth daily.    Dispense:  90 tablet    Refill:  3  . spironolactone (ALDACTONE) 25 MG tablet    Sig: Take 0.5 tablets (12.5 mg total) by mouth 2 (two) times daily.    Dispense:  90 tablet    Refill:  3     Keshonna Valvo Hulen Skains, DO [facility] [phone]

## 2020-08-24 ENCOUNTER — Telehealth: Payer: Self-pay

## 2020-08-24 NOTE — Telephone Encounter (Signed)
Patient aware.

## 2020-08-24 NOTE — Telephone Encounter (Signed)
Hyoscamine can WORSEN constipation.  It is indicated in abdominal cramping but should only be used if having diarrhea. Increase fluids, fiber to help regulate bowels

## 2020-09-21 ENCOUNTER — Other Ambulatory Visit: Payer: Self-pay | Admitting: Family Medicine

## 2020-09-21 DIAGNOSIS — I152 Hypertension secondary to endocrine disorders: Secondary | ICD-10-CM

## 2020-09-21 DIAGNOSIS — E1159 Type 2 diabetes mellitus with other circulatory complications: Secondary | ICD-10-CM

## 2020-09-22 ENCOUNTER — Other Ambulatory Visit: Payer: Self-pay

## 2020-09-22 ENCOUNTER — Ambulatory Visit (INDEPENDENT_AMBULATORY_CARE_PROVIDER_SITE_OTHER): Payer: Medicare HMO | Admitting: Family Medicine

## 2020-09-22 ENCOUNTER — Encounter: Payer: Self-pay | Admitting: Family Medicine

## 2020-09-22 VITALS — BP 111/63 | HR 84 | Temp 97.4°F | Ht 64.0 in | Wt 258.4 lb

## 2020-09-22 DIAGNOSIS — M7989 Other specified soft tissue disorders: Secondary | ICD-10-CM | POA: Diagnosis not present

## 2020-09-22 NOTE — Progress Notes (Signed)
Subjective: CC: Lump PCP: Janora Norlander, DO BZJ:IRCVE Sara [name] is a 75 y.o. female presenting to clinic today for:  1.  Lump behind ear Patient reports that she was being seen by her dentist recently who noticed a lump behind her left ear.  Patient does report that this has been present for years and she is had previous evaluation and it was not thought to be anything significant.  She denies any pain, growth, discharge.  She simply wants it evaluated to make sure it is nothing of concern.   ROS: Per HPI  Allergies  Allergen Reactions   Nifedipine Other (See Comments)    Brings blood pressure up really fast PROCARDIA   Tape     bleeding   Codeine Itching   Crestor [Rosuvastatin] Other (See Comments)    myalgias   Past Medical History:  Diagnosis Date   Arthritis    Arthritis -hands -Bil. 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 s/p Xience DES x 3 to LAD;  b. nuc study 02/27/10: EF 67% no ischemia;  c.  echo 11/12: Mild LVH, EF 93-81%, grade 1 diastolic dysfunction, moderate LAE;  d.  LHC 07/03/11: LAD stents patent, RCA 25%, EF 55-65% ;  e. [place]. Myoview 5/14:  No ischemia, EF 68%   Diverticular disease    GERD (gastroesophageal reflux disease)    Hemorrhoid    Hepatitis    yellow jaundice as a child    HTN (hypertension)    Hx of echocardiogram    a.  Echo (05/2011):  Mild LVH, EF 55-60%, Gr 1 DD, mod LAE.b. Echo (11/14):  Mild LVH, mild focal basal septal hypertrophy, EF 55-60%, Gr 1 DD, mild LAE, atrial septal aneurysm   Hyperlipidemia    Hypertension    Impaired hearing    bilateral- no hearing aids   Jaundice    Myocardial infarction (HCC)    hx of x 2    Obesity    OSA (obstructive sleep apnea)    needs no cpap per patient    Other psoriasis    ongoing- none at present.   Vertigo     Current Outpatient Medications:     albuterol (VENTOLIN HFA) 108 (90 Base) MCG/ACT inhaler, Inhale 2 puffs into the lungs every 6 (six) hours as needed for wheezing or shortness of breath., Disp: 18 g, Rfl: 1   alendronate (FOSAMAX) 70 MG tablet, Take 1 tablet (70 mg total) by mouth every 7 (seven) days. Take with a full glass of water on an empty stomach., Disp: 4 tablet, Rfl: 11   ALPRAZolam (XANAX) 0.5 MG tablet, TAKE 1 TABLET BY MOUTH TWICE A DAY AS NEEDED FOR ANXIETY, Disp: 60 tablet, Rfl: 0   aspirin EC 81 MG tablet, Take 81 mg by mouth daily., Disp: , Rfl:    atorvastatin (LIPITOR) 80 MG tablet, Take 1 tablet (80 mg total) by mouth every other day., Disp: 45 tablet, Rfl: 3   betamethasone valerate ointment (VALISONE) 0.1 %, Apply 1 application topically 2 (two) times daily as needed (psoriasis). (DO NOT apply to face, groin or axilla), Disp: 30 g, Rfl: 0   budesonide-formoterol (SYMBICORT) 80-4.5 MCG/ACT inhaler, Inhale 2 puffs into the lungs 2 (two) times daily., Disp: 1 each, Rfl: 3   Calcium Carbonate-Vitamin D (SUPER CALCIUM 600 + D3 PO), Take 1  tablet by mouth daily., Disp: , Rfl:    cyclobenzaprine (FLEXERIL) 5 MG tablet, TAKE 1 TABLET BY MOUTH THREE TIMES A DAY AS NEEDED FOR MUSCLE SPASMS, Disp: 30 tablet, Rfl: 0   diclofenac Sodium (VOLTAREN) 1 % GEL, Voltaren 1 % topical gel, Disp: , Rfl:    diltiazem (CARDIZEM CD) 120 MG 24 hr capsule, Take 1 capsule (120 mg total) by mouth daily., Disp: 90 capsule, Rfl: 3   fluticasone (FLONASE) 50 MCG/ACT nasal spray, SPRAY 1 SPRAY INTO EACH NOSTRIL TWICE A DAY, Disp: 48 g, Rfl: 1   furosemide (LASIX) 20 MG tablet, TAKE 1 TABLET BY MOUTH EVERY DAY, Disp: 30 tablet, Rfl: 0   hyoscyamine (LEVBID) 0.375 MG 12 hr tablet, Take 1 tablet (0.375 mg total) by mouth 2 (two) times daily., Disp: 60 tablet, Rfl: 2   losartan (COZAAR) 25 MG tablet, Take 1 tablet (25 mg total) by mouth 2 (two) times daily., Disp: 180 tablet, Rfl: 0   meclizine (ANTIVERT) 25 MG tablet, Take 1  tablet (25 mg total) by mouth 2 (two) times daily as needed for dizziness. Reported on 12/05/2015, Disp: 30 tablet, Rfl: 1   nitroGLYCERIN (NITROSTAT) 0.4 MG SL tablet, Place 1 tablet (0.4 mg total) under the tongue every 5 (five) minutes as needed for chest pain (x 3 doses). Reported on 12/05/2015, Disp: 25 tablet, Rfl: 1   omeprazole (PRILOSEC) 20 MG capsule, Take 1 capsule (20 mg total) by mouth daily., Disp: 90 capsule, Rfl: 1   potassium chloride (KLOR-CON 10) 10 MEQ tablet, Take 1 tablet (10 mEq total) by mouth daily., Disp: 90 tablet, Rfl: 3   spironolactone (ALDACTONE) 25 MG tablet, Take 0.5 tablets (12.5 mg total) by mouth 2 (two) times daily., Disp: 90 tablet, Rfl: 3 Social History   Socioeconomic History   Marital status: Married    Spouse name: Not on file   Number of children: 3   Years of education: Not on file   Highest education level: Not on file  Occupational History   Occupation: hairdresser  Tobacco Use   Smoking status: Never Smoker   Smokeless tobacco: Never Used  Scientific laboratory technician Use: Never used  Substance and Sexual Activity   Alcohol use: No   Drug use: No   Sexual activity: Not Currently    Birth control/protection: Post-menopausal  Other Topics Concern   Not on file  Social History Narrative   Not on file   Social Determinants of Health   Financial Resource Strain: Not on file  Food Insecurity: Not on file  Transportation Needs: Not on file  Physical Activity: Not on file  Stress: Not on file  Social Connections: Not on file  Intimate Partner Violence: Not on file   Family History  Problem Relation Age of Onset   Aneurysm Father    Stroke Mother    Hypertension Mother    Heart failure Mother    Breast cancer Mother    Seizures Brother    Muscular dystrophy Daughter        dx as infant, passed age 73   Seizures Son    Colon cancer Neg Hx     Objective: Office vital signs reviewed. BP 111/63    Pulse 84     Temp (!) 97.4 F (36.3 C) (Temporal)    Ht 5\' 4"  (1.626 m)    Wt 258 lb 6.4 oz (117.2 kg)    SpO2 97%    BMI 44.35 kg/m   Physical  Examination:  General: Awake, alert, well nourished, No acute distress HEENT: Golf ball sized soft tissue rubbery mass noted along the left mastoid area.  It is well-circumscribed, mobile and nontender.  Assessment/ Plan: 75 y.o. female   Mass of soft tissue of neck - Plan: US Soft Tissue Head/Neck (NON-THYROID)  Lesion seems consistent with a lipoma, albeit somewhat unusual to have it over this area given lack of significant fatty tissue.  We have ordered an ultrasound of the soft tissue to further evaluate and I discussed with patient that if it was inconclusive we would proceed with more advanced imaging.  She voiced good understanding and appreciation for today's visit.  Handout on lipoma provided.  We will follow-up pending results  No orders of the defined types were placed in this encounter.  No orders of the defined types were placed in this encounter.    Janora Norlander, DO [place] [phone]

## 2020-09-22 NOTE — Patient Instructions (Signed)
I think that this is a lipoma.  The location is somewhat unusual as there is not very much fat at the base of the head.  That is what lipomas are made of.  We will order an ultrasound to further evaluate and if it is inconclusive we can determine whether or not you need a CAT scan or MRI.  Lipomas are benign but we will rule out any other nonbenign processes   Lipoma  A lipoma is a noncancerous (benign) tumor that is made up of fat cells. This is a very common type of soft-tissue growth. Lipomas are usually found under the skin (subcutaneous). They may occur in any tissue of the body that contains fat. Common areas for lipomas to appear include the back, arms, shoulders, buttocks, and thighs. Lipomas grow slowly, and they are usually painless. Most lipomas do not cause problems and do not require treatment. What are the causes? The cause of this condition is not known. What increases the risk? You are more likely to develop this condition if:  You are 41-75 years old.  You have a family history of lipomas. What are the signs or symptoms? A lipoma usually appears as a small, round bump under the skin. In most cases, the lump will:  Feel soft or rubbery.  Not cause pain or other symptoms. However, if a lipoma is located in an area where it pushes on nerves, it can become painful or cause other symptoms. How is this diagnosed? A lipoma can usually be diagnosed with a physical exam. You may also have tests to confirm the diagnosis and to rule out other conditions. Tests may include:  Imaging tests, such as a CT scan or an MRI.  Removal of a tissue sample to be looked at under a microscope (biopsy). How is this treated? Treatment for this condition depends on the size of the lipoma and whether it is causing any symptoms.  For small lipomas that are not causing problems, no treatment is needed.  If a lipoma is bigger or it causes problems, surgery may be done to remove the lipoma. Lipomas  can also be removed to improve appearance. Most often, the procedure is done after applying a medicine that numbs the area (local anesthetic).  Liposuction may be done to reduce the size of the lipoma before it is removed through surgery, or it may be done to remove the lipoma. Lipomas are removed with this method in order to limit incision size and scarring. A liposuction tube is inserted through a small incision into the lipoma, and the contents of the lipoma are removed through the tube with suction. Follow these instructions at home:  Watch your lipoma for any changes.  Keep all follow-up visits as told by your health care provider. This is important. Contact a health care provider if:  Your lipoma becomes larger or hard.  Your lipoma becomes painful, red, or increasingly swollen. These could be signs of infection or a more serious condition. Get help right away if:  You develop tingling or numbness in an area near the lipoma. This could indicate that the lipoma is causing nerve damage. Summary  A lipoma is a noncancerous tumor that is made up of fat cells.  Most lipomas do not cause problems and do not require treatment.  If a lipoma is bigger or it causes problems, surgery may be done to remove the lipoma.  Contact a health care provider if your lipoma becomes larger or hard, or if it  becomes painful, red, or increasingly swollen. Pain, redness, and swelling could be signs of infection or a more serious condition. This information is not intended to replace advice given to you by your health care provider. Make sure you discuss any questions you have with your health care provider. Document Revised: 02/23/2019 Document Reviewed: 02/23/2019 Elsevier Patient Education  [place].

## 2020-10-04 ENCOUNTER — Other Ambulatory Visit: Payer: Medicare HMO

## 2020-10-04 ENCOUNTER — Ambulatory Visit
Admission: RE | Admit: 2020-10-04 | Discharge: 2020-10-04 | Disposition: A | Discharge status: Home / Self Care | Payer: Medicare HMO | Source: Ambulatory Visit | Attending: Family Medicine | Admitting: Family Medicine

## 2020-10-04 DIAGNOSIS — R221 Localized swelling, mass and lump, neck: Secondary | ICD-10-CM | POA: Diagnosis not present

## 2020-10-04 DIAGNOSIS — M7989 Other specified soft tissue disorders: Secondary | ICD-10-CM

## 2020-10-20 ENCOUNTER — Other Ambulatory Visit: Payer: Self-pay | Admitting: Family Medicine

## 2020-10-20 DIAGNOSIS — I152 Hypertension secondary to endocrine disorders: Secondary | ICD-10-CM

## 2020-10-20 DIAGNOSIS — E1159 Type 2 diabetes mellitus with other circulatory complications: Secondary | ICD-10-CM

## 2020-10-28 DIAGNOSIS — I252 Old myocardial infarction: Secondary | ICD-10-CM | POA: Diagnosis not present

## 2020-10-28 DIAGNOSIS — J45909 Unspecified asthma, uncomplicated: Secondary | ICD-10-CM | POA: Diagnosis not present

## 2020-10-28 DIAGNOSIS — G8929 Other chronic pain: Secondary | ICD-10-CM | POA: Diagnosis not present

## 2020-10-28 DIAGNOSIS — I2510 Atherosclerotic heart disease of native coronary artery without angina pectoris: Secondary | ICD-10-CM | POA: Diagnosis not present

## 2020-10-28 DIAGNOSIS — E785 Hyperlipidemia, unspecified: Secondary | ICD-10-CM | POA: Diagnosis not present

## 2020-10-28 DIAGNOSIS — I10 Essential (primary) hypertension: Secondary | ICD-10-CM | POA: Diagnosis not present

## 2020-10-28 DIAGNOSIS — Z6841 Body Mass Index (BMI) 40.0 and over, adult: Secondary | ICD-10-CM | POA: Diagnosis not present

## 2020-10-28 DIAGNOSIS — K219 Gastro-esophageal reflux disease without esophagitis: Secondary | ICD-10-CM | POA: Diagnosis not present

## 2020-10-28 DIAGNOSIS — M1990 Unspecified osteoarthritis, unspecified site: Secondary | ICD-10-CM | POA: Diagnosis not present

## 2020-12-05 ENCOUNTER — Other Ambulatory Visit: Payer: Self-pay | Admitting: Family Medicine

## 2020-12-05 DIAGNOSIS — J40 Bronchitis, not specified as acute or chronic: Secondary | ICD-10-CM

## 2020-12-22 ENCOUNTER — Other Ambulatory Visit: Payer: Self-pay | Admitting: Family Medicine

## 2021-01-04 DIAGNOSIS — Z23 Encounter for immunization: Secondary | ICD-10-CM | POA: Diagnosis not present

## 2021-01-09 ENCOUNTER — Encounter: Payer: Self-pay | Admitting: Nurse Practitioner

## 2021-01-11 ENCOUNTER — Ambulatory Visit (INDEPENDENT_AMBULATORY_CARE_PROVIDER_SITE_OTHER): Payer: Medicare HMO | Admitting: *Deleted

## 2021-01-11 ENCOUNTER — Other Ambulatory Visit: Payer: Self-pay

## 2021-01-11 DIAGNOSIS — Z23 Encounter for immunization: Secondary | ICD-10-CM

## 2021-01-16 DIAGNOSIS — K5900 Constipation, unspecified: Secondary | ICD-10-CM | POA: Diagnosis not present

## 2021-01-16 DIAGNOSIS — K5792 Diverticulitis of intestine, part unspecified, without perforation or abscess without bleeding: Secondary | ICD-10-CM | POA: Diagnosis not present

## 2021-01-16 DIAGNOSIS — R197 Diarrhea, unspecified: Secondary | ICD-10-CM | POA: Diagnosis not present

## 2021-01-20 ENCOUNTER — Other Ambulatory Visit: Payer: Self-pay

## 2021-01-20 ENCOUNTER — Encounter (HOSPITAL_COMMUNITY): Payer: Self-pay | Admitting: *Deleted

## 2021-01-20 ENCOUNTER — Emergency Department (HOSPITAL_COMMUNITY): Payer: Medicare HMO

## 2021-01-20 ENCOUNTER — Emergency Department (HOSPITAL_COMMUNITY)
Admission: EM | Admit: 2021-01-20 | Discharge: 2021-01-20 | Disposition: A | Discharge status: Home / Self Care | Payer: Medicare HMO | Attending: Emergency Medicine | Admitting: Emergency Medicine

## 2021-01-20 DIAGNOSIS — Z7982 Long term (current) use of aspirin: Secondary | ICD-10-CM | POA: Diagnosis not present

## 2021-01-20 DIAGNOSIS — R1031 Right lower quadrant pain: Secondary | ICD-10-CM | POA: Diagnosis present

## 2021-01-20 DIAGNOSIS — Z79899 Other long term (current) drug therapy: Secondary | ICD-10-CM | POA: Insufficient documentation

## 2021-01-20 DIAGNOSIS — R630 Anorexia: Secondary | ICD-10-CM | POA: Diagnosis not present

## 2021-01-20 DIAGNOSIS — E119 Type 2 diabetes mellitus without complications: Secondary | ICD-10-CM | POA: Diagnosis not present

## 2021-01-20 DIAGNOSIS — Z96652 Presence of left artificial knee joint: Secondary | ICD-10-CM | POA: Diagnosis not present

## 2021-01-20 DIAGNOSIS — Z96641 Presence of right artificial hip joint: Secondary | ICD-10-CM | POA: Insufficient documentation

## 2021-01-20 DIAGNOSIS — K5730 Diverticulosis of large intestine without perforation or abscess without bleeding: Secondary | ICD-10-CM | POA: Diagnosis not present

## 2021-01-20 DIAGNOSIS — K5732 Diverticulitis of large intestine without perforation or abscess without bleeding: Secondary | ICD-10-CM | POA: Diagnosis not present

## 2021-01-20 DIAGNOSIS — Z955 Presence of coronary angioplasty implant and graft: Secondary | ICD-10-CM | POA: Diagnosis not present

## 2021-01-20 DIAGNOSIS — K5792 Diverticulitis of intestine, part unspecified, without perforation or abscess without bleeding: Secondary | ICD-10-CM

## 2021-01-20 DIAGNOSIS — K449 Diaphragmatic hernia without obstruction or gangrene: Secondary | ICD-10-CM | POA: Diagnosis not present

## 2021-01-20 DIAGNOSIS — I2510 Atherosclerotic heart disease of native coronary artery without angina pectoris: Secondary | ICD-10-CM | POA: Insufficient documentation

## 2021-01-20 DIAGNOSIS — Z8616 Personal history of COVID-19: Secondary | ICD-10-CM | POA: Insufficient documentation

## 2021-01-20 DIAGNOSIS — K5900 Constipation, unspecified: Secondary | ICD-10-CM | POA: Diagnosis not present

## 2021-01-20 DIAGNOSIS — I10 Essential (primary) hypertension: Secondary | ICD-10-CM | POA: Insufficient documentation

## 2021-01-20 DIAGNOSIS — K219 Gastro-esophageal reflux disease without esophagitis: Secondary | ICD-10-CM | POA: Diagnosis not present

## 2021-01-20 LAB — URINALYSIS, ROUTINE W REFLEX MICROSCOPIC
Bilirubin Urine: NEGATIVE
Glucose, UA: NEGATIVE mg/dL
Hgb urine dipstick: NEGATIVE
Ketones, ur: 5 mg/dL — AB
Nitrite: NEGATIVE
Protein, ur: NEGATIVE mg/dL
Specific Gravity, Urine: 1.015 (ref 1.005–1.030)
pH: 6 (ref 5.0–8.0)

## 2021-01-20 LAB — COMPREHENSIVE METABOLIC PANEL
ALT: 20 U/L (ref 0–44)
AST: 29 U/L (ref 15–41)
Albumin: 3.5 g/dL (ref 3.5–5.0)
Alkaline Phosphatase: 54 U/L (ref 38–126)
Anion gap: 7 (ref 5–15)
BUN: 9 mg/dL (ref 8–23)
CO2: 23 mmol/L (ref 22–32)
Calcium: 8.7 mg/dL — ABNORMAL LOW (ref 8.9–10.3)
Chloride: 104 mmol/L (ref 98–111)
Creatinine, Ser: 0.63 mg/dL (ref 0.44–1.00)
GFR, Estimated: >60 mL/min (ref >60)
Glucose, Bld: 125 mg/dL — ABNORMAL HIGH (ref 70–99)
Potassium: 3.9 mmol/L (ref 3.5–5.1)
Sodium: 134 mmol/L — ABNORMAL LOW (ref 135–145)
Total Bilirubin: 0.9 mg/dL (ref 0.3–1.2)
Total Protein: 6.5 g/dL (ref 6.5–8.1)

## 2021-01-20 LAB — CBC
HCT: 40.4 % (ref 36.0–46.0)
Hemoglobin: 13.8 g/dL (ref 12.0–15.0)
MCH: 33.1 pg (ref 26.0–34.0)
MCHC: 34.2 g/dL (ref 30.0–36.0)
MCV: 96.9 fL (ref 80.0–100.0)
Platelets: 189 10*3/uL (ref 150–400)
RBC: 4.17 MIL/uL (ref 3.87–5.11)
RDW: 13.1 % (ref 11.5–15.5)
WBC: 7.7 10*3/uL (ref 4.0–10.5)
nRBC: 0 % (ref 0.0–0.2)

## 2021-01-20 LAB — LIPASE, BLOOD: Lipase: 32 U/L (ref 11–51)

## 2021-01-20 MED ORDER — METRONIDAZOLE 500 MG PO TABS
500.0000 mg | ORAL_TABLET | Freq: Once | ORAL | Status: AC
  Administered 2021-01-20: 500 mg via ORAL
  Filled 2021-01-20: qty 1

## 2021-01-20 MED ORDER — CIPROFLOXACIN HCL 500 MG PO TABS: 500.0000 mg | ORAL_TABLET | Freq: Two times a day (BID) | ORAL | 0 refills | Status: DC

## 2021-01-20 MED ORDER — METRONIDAZOLE 500 MG PO TABS: 500.0000 mg | ORAL_TABLET | Freq: Four times a day (QID) | ORAL | 0 refills | Status: DC

## 2021-01-20 MED ORDER — CIPROFLOXACIN HCL 250 MG PO TABS
500.0000 mg | ORAL_TABLET | Freq: Once | ORAL | Status: AC
  Administered 2021-01-20: 500 mg via ORAL
  Filled 2021-01-20: qty 2

## 2021-01-20 MED ORDER — IOHEXOL 300 MG/ML  SOLN
100.0000 mL | Freq: Once | INTRAMUSCULAR | Status: AC | PRN
  Administered 2021-01-20: 100 mL via INTRAVENOUS

## 2021-01-20 MED ORDER — HYDROCODONE-ACETAMINOPHEN 5-325 MG PO TABS: 1.0000 | ORAL_TABLET | Freq: Four times a day (QID) | ORAL | 0 refills | Status: DC | PRN

## 2021-01-20 NOTE — ED Triage Notes (Signed)
Abdominal pain with constipation for 6 weeks intermittent

## 2021-01-20 NOTE — ED Provider Notes (Signed)
[facility] EMERGENCY DEPARTMENT Provider Note   CSN: [id number] Arrival date & time: 01/20/21  1636     History Chief Complaint  Patient presents with   Abdominal Pain    Sara [name] is a 75 y.o. female.  Patient complains of abdominal pain off and on for the lower part of her abdomen for a number days.  No fevers no chills  The history is provided by the patient and medical records. No language interpreter was used.  Abdominal Pain Pain location:  RLQ and LLQ Pain quality: aching   Pain radiates to:  Does not radiate Pain severity:  Moderate Onset quality:  Sudden Duration: 2 to 3 days. Timing:  Constant Progression:  Waxing and waning Chronicity:  New Context: not alcohol use   Relieved by:  Nothing Worsened by:  Nothing Associated symptoms: no chest pain, no cough, no diarrhea, no fatigue and no hematuria       Past Medical History:  Diagnosis Date   Arthritis    Arthritis -hands -Bil. knee replacemnts   AVNRT (AV nodal re-entry tachycardia) ([place])    s/p RFCA 09/2011   Bronchitis    none recent   CAD (coronary artery disease)    a. s/p Xience DES x 3 to LAD;  b. nuc study 02/27/10: EF 67% no ischemia;  c.  echo 11/12: Mild LVH, EF 12-45%, grade 1 diastolic dysfunction, moderate LAE;  d.  LHC 07/03/11: LAD stents patent, RCA 25%, EF 55-65% ;  e. [place]. Myoview 5/14:  No ischemia, EF 68%   Diverticular disease    GERD (gastroesophageal reflux disease)    Hemorrhoid    Hepatitis    yellow jaundice as a child    HTN (hypertension)    Hx of echocardiogram    a.  Echo (05/2011):  Mild LVH, EF 55-60%, Gr 1 DD, mod LAE.b. Echo (11/14):  Mild LVH, mild focal basal septal hypertrophy, EF 55-60%, Gr 1 DD, mild LAE, atrial septal aneurysm   Hyperlipidemia    Hypertension    Impaired hearing    bilateral- no hearing aids   Jaundice    Myocardial infarction (HCC)    hx of x 2    Obesity    OSA (obstructive sleep apnea)    needs no cpap per patient    Other  psoriasis    ongoing- none at present.   Vertigo     Patient Active Problem List   Diagnosis Date Noted   New onset type 2 diabetes mellitus ([place]) 08/23/2020   COVID-19 virus infection 07/08/2019   Hyperlipidemia associated with type 2 diabetes mellitus ([place]) 08/27/2018   Morbid obesity ([place]) 08/27/2018   Chronic left-sided low back pain with left-sided sciatica 05/10/2017   Coronary artery disease involving native coronary artery of native heart without angina pectoris 03/20/2017   LBBB (left bundle branch block) 03/20/2017   Age related osteoporosis 10/17/2016   Acute rhinosinusitis 10/17/2016   Primary osteoarthritis of right hip 02/23/2016   Hypokalemia 01/20/2015   OA (osteoarthritis) of knee 12/22/2012   AVNRT (AV nodal re-entry tachycardia) ([place])    Atrial arrhythmia 07/03/2011   OBSTRUCTIVE SLEEP APNEA 03/15/2010   EDEMA 02/28/2009   Hyperlipidemia 12/09/2008   Hypertension associated with diabetes ([place]) 12/09/2008   Coronary atherosclerosis 12/09/2008   HEMORRHOIDS 12/09/2008   GERD 12/09/2008   PSORIASIS 12/09/2008   Arthritis 12/09/2008   VERTIGO 12/09/2008   CARDIAC MURMUR 12/09/2008    Past Surgical History:  Procedure Laterality Date  ABDOMINAL HYSTERECTOMY     BACK SURGERY     Dr. Lawernce Pitts   CARDIAC CATHETERIZATION  08/23/2011   Ablation AV  Node   COLONOSCOPY     CORONARY ANGIOPLASTY     with stents 2009    KNEE SURGERY     right   LEFT AND RIGHT HEART CATHETERIZATION WITH CORONARY ANGIOGRAM N/A 08/18/2013   Procedure: LEFT AND RIGHT HEART CATHETERIZATION WITH CORONARY ANGIOGRAM;  Surgeon: Larey Dresser, MD;  Location[facility] LAB;  Service: Cardiovascular;  Laterality: N/A;   LEFT HEART CATHETERIZATION WITH CORONARY ANGIOGRAM N/A 07/03/2011   Procedure: LEFT HEART CATHETERIZATION WITH CORONARY ANGIOGRAM;  Surgeon: Minus Breeding, MD;  Location: [facility] CATH LAB;  Service: Cardiovascular;  Laterality: N/A;   REPAIR RECTOCELE     SUPRAVENTRICULAR  TACHYCARDIA ABLATION N/A 08/23/2011   Procedure: SUPRAVENTRICULAR TACHYCARDIA ABLATION;  Surgeon: Evans Lance, MD;  Location: [facility] CATH LAB;  Service: Cardiovascular;  Laterality: N/A;   TOTAL HIP ARTHROPLASTY Right 02/23/2016   Procedure: RIGHT TOTAL HIP ARTHROPLASTY ANTERIOR APPROACH;  Surgeon: Rod Can, MD;  Location: [facility] ORS;  Service: Orthopedics;  Laterality: Right;   TOTAL KNEE ARTHROPLASTY Left 12/22/2012   Procedure: LEFT TOTAL KNEE ARTHROPLASTY;  Surgeon: Gearlean Alf, MD;  Location: [facility] ORS;  Service: Orthopedics;  Laterality: Left;   TUBAL LIGATION     UPPER GASTROINTESTINAL ENDOSCOPY       OB History   No obstetric history on file.     Family History  Problem Relation Age of Onset   Aneurysm Father    Stroke Mother    Hypertension Mother    Heart failure Mother    Breast cancer Mother    Seizures Brother    Muscular dystrophy Daughter        dx as infant, passed age 41   Seizures Son    Colon cancer Neg Hx     Social History   Tobacco Use   Smoking status: Never   Smokeless tobacco: Never  Vaping Use   Vaping Use: Never used  Substance Use Topics   Alcohol use: No   Drug use: No    Home Medications Prior to Admission medications   Medication Sig Start Date End Date Taking? Authorizing Provider  albuterol (VENTOLIN HFA) 108 (90 Base) MCG/ACT inhaler TAKE 2 PUFFS BY MOUTH EVERY 6 HOURS AS NEEDED FOR WHEEZE OR SHORTNESS OF BREATH Patient taking differently: Inhale 2 puffs into the lungs every 6 (six) hours as needed for wheezing or shortness of breath. 12/05/20  Yes Ronnie Doss M, DO  alendronate (FOSAMAX) 70 MG tablet Take 1 tablet (70 mg total) by mouth every 7 (seven) days. Take with a full glass of water on an empty stomach. 04/19/20  Yes Gottschalk, Ashly M, DO  ALPRAZolam (XANAX) 0.5 MG tablet TAKE 1 TABLET BY MOUTH TWICE A DAY AS NEEDED FOR ANXIETY Patient taking differently: Take 0.5 mg by mouth 2 (two) times daily as needed for anxiety or  sleep. 06/20/18  Yes Hawks, Christy A, FNP  aspirin EC 81 MG tablet Take 81 mg by mouth daily.   Yes [provider]  atorvastatin (LIPITOR) 80 MG tablet Take 1 tablet (80 mg total) by mouth every other day. 08/23/20  Yes Gottschalk, Leatrice Jewels M, DO  betamethasone valerate ointment (VALISONE) 0.1 % Apply 1 application topically 2 (two) times daily as needed (psoriasis). (DO NOT apply to face, groin or axilla) 08/23/20  Yes Gottschalk, Ashly M, DO  budesonide-formoterol (SYMBICORT) 80-4.5 MCG/ACT  inhaler Inhale 2 puffs into the lungs 2 (two) times daily. 08/23/20  Yes Ronnie Doss M, DO  Calcium Carbonate-Vitamin D (SUPER CALCIUM 600 + D3 PO) Take 1 tablet by mouth daily.   Yes [provider]  ciprofloxacin (CIPRO) 500 MG tablet Take 1 tablet (500 mg total) by mouth 2 (two) times daily. One po bid x 7 days 01/20/21  Yes Milton Ferguson, MD  diclofenac Sodium (VOLTAREN) 1 % GEL Apply 2 g topically at bedtime.   Yes [provider]  diltiazem (CARDIZEM CD) 120 MG 24 hr capsule Take 1 capsule (120 mg total) by mouth daily. 08/23/20  Yes Gottschalk, Ashly M, DO  fluticasone (FLONASE) 50 MCG/ACT nasal spray SPRAY 1 SPRAY INTO EACH NOSTRIL TWICE A DAY 02/22/20  Yes Gottschalk, Ashly M, DO  furosemide (LASIX) 20 MG tablet TAKE 1 TABLET BY MOUTH EVERY DAY 10/20/20  Yes Ronnie Doss M, DO  HYDROcodone-acetaminophen (NORCO/VICODIN) 5-325 MG tablet Take 1 tablet by mouth every 6 (six) hours as needed for moderate pain. 01/20/21  Yes Milton Ferguson, MD  losartan (COZAAR) 25 MG tablet TAKE 1 TABLET BY MOUTH TWICE A DAY 12/22/20  Yes Gottschalk, Ashly M, DO  metroNIDAZOLE (FLAGYL) 500 MG tablet Take 1 tablet (500 mg total) by mouth 4 (four) times daily. One po bid x 7 days 01/20/21  Yes Milton Ferguson, MD  nitroGLYCERIN (NITROSTAT) 0.4 MG SL tablet Place 1 tablet (0.4 mg total) under the tongue every 5 (five) minutes as needed for chest pain (x 3 doses). Reported on 12/05/2015 08/23/20  Yes Ronnie Doss M, DO  omeprazole (PRILOSEC) 20 MG capsule Take 1 capsule (20 mg total) by mouth daily. 08/23/20  Yes Gottschalk, Leatrice Jewels M, DO  polyethylene glycol powder (GLYCOLAX/MIRALAX) 17 GM/SCOOP powder Take 0.5 Containers by mouth daily. 01/16/21  Yes [provider]  potassium chloride (KLOR-CON 10) 10 MEQ tablet Take 1 tablet (10 mEq total) by mouth daily. 08/23/20  Yes Ronnie Doss M, DO  spironolactone (ALDACTONE) 25 MG tablet Take 0.5 tablets (12.5 mg total) by mouth 2 (two) times daily. 08/23/20  Yes Gottschalk, Ashly M, DO  cyclobenzaprine (FLEXERIL) 5 MG tablet TAKE 1 TABLET BY MOUTH THREE TIMES A DAY AS NEEDED FOR MUSCLE SPASMS Patient not taking: No sig reported 06/20/18   Evelina Dun A, FNP  hyoscyamine (LEVBID) 0.375 MG 12 hr tablet Take 1 tablet (0.375 mg total) by mouth 2 (two) times daily. Patient not taking: No sig reported 12/31/18   Baruch Gouty, FNP  meclizine (ANTIVERT) 25 MG tablet Take 1 tablet (25 mg total) by mouth 2 (two) times daily as needed for dizziness. Reported on 12/05/2015 Patient not taking: No sig reported 06/20/18   Evelina Dun A, FNP    Allergies    Nifedipine, Tape, Codeine, and Crestor [rosuvastatin]  Review of Systems   Review of Systems  Constitutional:  Negative for appetite change and fatigue.  HENT:  Negative for congestion, ear discharge and sinus pressure.   Eyes:  Negative for discharge.  Respiratory:  Negative for cough.   Cardiovascular:  Negative for chest pain.  Gastrointestinal:  Positive for abdominal pain. Negative for diarrhea.  Genitourinary:  Negative for frequency and hematuria.  Musculoskeletal:  Negative for back pain.  Skin:  Negative for rash.  Neurological:  Negative for seizures and headaches.  Psychiatric/Behavioral:  Negative for hallucinations.    Physical Exam Updated Vital Signs BP 127/68   Pulse 90   Temp 98.4 F (36.9 C) (Oral)  Resp 18   SpO2 94%   Physical Exam Vitals and nursing note reviewed.   Constitutional:      Appearance: Normal appearance. She is well-developed.  HENT:     Head: Normocephalic.  Eyes:     General: No scleral icterus.    Conjunctiva/sclera: Conjunctivae normal.  Neck:     Thyroid: No thyromegaly.  Cardiovascular:     Rate and Rhythm: Normal rate and regular rhythm.     Heart sounds: No murmur heard.   No friction rub. No gallop.  Pulmonary:     Breath sounds: No stridor. No wheezing or rales.  Chest:     Chest wall: No tenderness.  Abdominal:     General: There is no distension.     Tenderness: There is no abdominal tenderness. There is no rebound.     Comments: Moderate tenderness lower abdomen  Musculoskeletal:        General: Normal range of motion.     Cervical back: Neck supple.  Lymphadenopathy:     Cervical: No cervical adenopathy.  Skin:    Findings: No erythema or rash.  Neurological:     Mental Status: She is alert and oriented to person, place, and time.     Motor: No abnormal muscle tone.     Coordination: Coordination normal.  Psychiatric:        Behavior: Behavior normal.    ED Results / Procedures / Treatments   Labs (all labs ordered are listed, but only abnormal results are displayed) Labs Reviewed  COMPREHENSIVE METABOLIC PANEL - Abnormal; Notable for the following components:      Result Value   Sodium 134 (*)    Glucose, Bld 125 (*)    Calcium 8.7 (*)    All other components within normal limits  URINALYSIS, ROUTINE W REFLEX MICROSCOPIC - Abnormal; Notable for the following components:   Ketones, ur 5 (*)    Leukocytes,Ua TRACE (*)    Bacteria, UA RARE (*)    All other components within normal limits  LIPASE, BLOOD  CBC    EKG None  Radiology CT ABDOMEN PELVIS W CONTRAST  Result Date: 01/20/2021 CLINICAL DATA:  Abdominal abscess/infection suspected. Constipation. Loss of appetite. EXAM: CT ABDOMEN AND PELVIS WITH CONTRAST TECHNIQUE: Multidetector CT imaging of the abdomen and pelvis was performed using  the standard protocol following bolus administration of intravenous contrast. CONTRAST:  153mL OMNIPAQUE IOHEXOL 300 MG/ML  SOLN COMPARISON:  None. FINDINGS: Lower chest: No acute abnormality.  Likely tiny hiatal hernia. Hepatobiliary: No focal liver abnormality. No gallstones, gallbladder wall thickening, or pericholecystic fluid. No biliary dilatation. Pancreas: No focal lesion. Normal pancreatic contour. No surrounding inflammatory changes. No main pancreatic ductal dilatation. Spleen: Normal in size without focal abnormality. Adrenals/Urinary Tract: No adrenal nodule bilaterally. Bilateral kidneys enhance symmetrically. No hydronephrosis. No hydroureter. The urinary bladder is grossly unremarkable in poorly visualized due to streak artifact originating from right femoral surgical hardware. Stomach/Bowel: Stomach is within normal limits. No evidence of bowel wall thickening or dilatation. Diffuse sigmoid diverticulosis with associated mild bowel wall thickening and pericolonic fat stranding along the mid to distal sigmoid. No definite free pelvic gas or intramural abscess formation. Appendix appears normal. Vascular/Lymphatic: A left splenorenal shunt is noted. No recanalized paraumbilical vein. No abdominal aorta or iliac aneurysm. Mild to moderate atherosclerotic plaque of the aorta and its branches. No abdominal, pelvic, or inguinal lymphadenopathy. Reproductive: Limited evaluation due to streak artifact originating from a right femoral surgical hardware. Status post hysterectomy.  No adnexal masses. Other: No intraperitoneal free fluid. No intraperitoneal free gas. No organized fluid collection. Musculoskeletal: No abdominal wall hernia or abnormality. No suspicious lytic or blastic osseous lesions. No acute displaced fracture. Multilevel degenerative changes of the spine. Status post right hip total arthroplasty is partially visualized. IMPRESSION: 1. Diffuse sigmoid diverticulosis with uncomplicated acute  diverticulitis. 2. Left splenorenal shunt of unclear etiology. 3. Tiny hiatal hernia. 4.  Aortic Atherosclerosis (ICD10-I70.0). Electronically Signed   By: Iven Finn M.D.   On: 01/20/2021 22:47    Procedures Procedures   Medications Ordered in ED Medications  ciprofloxacin (CIPRO) tablet 500 mg (has no administration in time range)  metroNIDAZOLE (FLAGYL) tablet 500 mg (has no administration in time range)  iohexol (OMNIPAQUE) 300 MG/ML solution 100 mL (100 mLs Intravenous Contrast Given 01/20/21 2212)    ED Course  I have reviewed the triage vital signs and the nursing notes.  Pertinent labs & imaging results that were available during my care of the patient were reviewed by me and considered in my medical decision making (see chart for details).    MDM Rules/Calculators/A&P                          Patient with uncomplicated diverticulitis she will be treated with Flagyl Cipro and hydrocodone for pain and follow-up with GI Final Clinical Impression(s) / ED Diagnoses Final diagnoses:  Diverticulitis    Rx / DC Orders ED Discharge Orders          Ordered    ciprofloxacin (CIPRO) 500 MG tablet  2 times daily        01/20/21 2257    metroNIDAZOLE (FLAGYL) 500 MG tablet  4 times daily        01/20/21 2257    HYDROcodone-acetaminophen (NORCO/VICODIN) 5-325 MG tablet  Every 6 hours PRN        01/20/21 2257             Milton Ferguson, MD 01/24/21 1052

## 2021-01-20 NOTE — Discharge Instructions (Addendum)
Follow-up with your gastroenterologist as planned.  Return sooner if any problem

## 2021-02-08 ENCOUNTER — Encounter: Payer: Self-pay | Admitting: Nurse Practitioner

## 2021-02-08 ENCOUNTER — Ambulatory Visit: Payer: Medicare HMO | Admitting: Nurse Practitioner

## 2021-02-08 VITALS — BP 110/70 | HR 80 | Ht 64.5 in | Wt 252.0 lb

## 2021-02-08 DIAGNOSIS — K5732 Diverticulitis of large intestine without perforation or abscess without bleeding: Secondary | ICD-10-CM | POA: Diagnosis not present

## 2021-02-08 NOTE — Progress Notes (Signed)
ASSESSMENT AND PLAN    # 75 yo female with recent episode of uncomplicated sigmoid diverticulitis. Pain resolved with antibiotics. --She took antibiotics as directed for a few days but since feeling better has reduced dose to about one pill a day which she is still taking ( after 20 days).  Advised her to discontinue antibiotics.  --Patient will call our office for any recurrent abdominal pain/bowel changes.   # Constipation associated with diverticulitis, resolved.  --She typically doesn't have problems with constipation so no need to continue daily Miralax. She is having 2-3 BMs a day. Can use Miralax on as needed basis    # Colon cancer screening. Her 10 year screening colonoscopy is due April 2023. She will probably need an office visit beforehand to make sure she is still willing and able to safely undergo the procedure   HISTORY OF PRESENT ILLNESS     Chief Complaint : diverticulitis  Sara [name] is a 75 y.o. female ,known to Dr. Carlean Purl ( 2013) for evaluation of constipation. She has a past medical history significant for diverticulitis, obesity, hypertension, hyperlipidemia , DM2, CAD status post PCI, OSA, psoriasis, osteoporosis. See PMH below for any additional history.   Patient comes in for evaluation of recent diverticulitis.  Approximately two weeks ago she had severe constipation and LLQ pain. She went to urgent care. Their note mentions diarrhea but patient says she was not having diarrhea.  Urgent Care recommended Miralax. She was also given antiobiotics for presumed diverticulitis. She took the antibiotics as directed but by the 4th day was still having lower abdominal pain despite resolution of constipation. . In ED her labs were unremarkable. CT scan w/ contrast showed uncomplicated sigmoid diverticulitis. The ED extended the course of Cipro Flagyl. Once feeling better patient reduced dose of antibiotics and is still taking one or so pills a day.  She is still  taking daily Miralax  PREVIOUS EVALUATIONS:   April 2013 Colonoscopy -diverticulosis  01/20/2021 CT scan abdomen and pelvis with contrast IMPRESSION: 1. Diffuse sigmoid diverticulosis with uncomplicated acute diverticulitis. 2. Left splenorenal shunt of unclear etiology. 3. Tiny hiatal hernia. 4.  Aortic Atherosclerosis (ICD10-I70.0).    Past Medical History:  Diagnosis Date   Arthritis    Arthritis -hands -Bil. 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 s/p Xience DES x 3 to LAD;  b. nuc study 02/27/10: EF 67% no ischemia;  c.  echo 11/12: Mild LVH, EF 64-40%, grade 1 diastolic dysfunction, moderate LAE;  d.  LHC 07/03/11: LAD stents patent, RCA 25%, EF 55-65% ;  e. [place]. Myoview 5/14:  No ischemia, EF 68%   Diverticular disease    GERD (gastroesophageal reflux disease)    Hemorrhoid    Hepatitis    yellow jaundice as a child    HTN (hypertension)    Hx of echocardiogram    a.  Echo (05/2011):  Mild LVH, EF 55-60%, Gr 1 DD, mod LAE.b. Echo (11/14):  Mild LVH, mild focal basal septal hypertrophy, EF 55-60%, Gr 1 DD, mild LAE, atrial septal aneurysm   Hyperlipidemia    Hypertension    Impaired hearing    bilateral- no hearing aids   Jaundice    Myocardial infarction (HCC)    hx of x 2    Obesity    OSA (obstructive sleep  apnea)    needs no cpap per patient    Other psoriasis    ongoing- none at present.   Vertigo      Past Surgical History:  Procedure Laterality Date   ABDOMINAL HYSTERECTOMY     BACK SURGERY     Dr. Lawernce Pitts   CARDIAC CATHETERIZATION  08/23/2011   Ablation AV  Node   COLONOSCOPY     CORONARY ANGIOPLASTY     with stents 2009    KNEE SURGERY     right   LEFT AND RIGHT HEART CATHETERIZATION WITH CORONARY ANGIOGRAM N/A 08/18/2013   Procedure: LEFT AND RIGHT HEART CATHETERIZATION WITH CORONARY ANGIOGRAM;  Surgeon: Larey Dresser, MD;  Location:  [facility] CATH LAB;  Service: Cardiovascular;  Laterality: N/A;   LEFT HEART CATHETERIZATION WITH CORONARY ANGIOGRAM N/A 07/03/2011   Procedure: LEFT HEART CATHETERIZATION WITH CORONARY ANGIOGRAM;  Surgeon: Minus Breeding, MD;  Location: [facility] CATH LAB;  Service: Cardiovascular;  Laterality: N/A;   REPAIR RECTOCELE     SUPRAVENTRICULAR TACHYCARDIA ABLATION N/A 08/23/2011   Procedure: SUPRAVENTRICULAR TACHYCARDIA ABLATION;  Surgeon: Evans Lance, MD;  Location: [facility] CATH LAB;  Service: Cardiovascular;  Laterality: N/A;   TOTAL HIP ARTHROPLASTY Right 02/23/2016   Procedure: RIGHT TOTAL HIP ARTHROPLASTY ANTERIOR APPROACH;  Surgeon: Rod Can, MD;  Location: [facility] ORS;  Service: Orthopedics;  Laterality: Right;   TOTAL KNEE ARTHROPLASTY Left 12/22/2012   Procedure: LEFT TOTAL KNEE ARTHROPLASTY;  Surgeon: Gearlean Alf, MD;  Location: [facility] ORS;  Service: Orthopedics;  Laterality: Left;   TUBAL LIGATION     UPPER GASTROINTESTINAL ENDOSCOPY     Family History  Problem Relation Age of Onset   Aneurysm Father    Stroke Mother    Hypertension Mother    Heart failure Mother    Breast cancer Mother    Seizures Brother    Muscular dystrophy Daughter        dx as infant, passed age 33   Seizures Son    Colon cancer Neg Hx    Social History   Tobacco Use   Smoking status: Never   Smokeless tobacco: Never  Vaping Use   Vaping Use: Never used  Substance Use Topics   Alcohol use: No   Drug use: No   Current Outpatient Medications  Medication Sig Dispense Refill   albuterol (VENTOLIN HFA) 108 (90 Base) MCG/ACT inhaler TAKE 2 PUFFS BY MOUTH EVERY 6 HOURS AS NEEDED FOR WHEEZE OR SHORTNESS OF BREATH (Patient taking differently: Inhale 2 puffs into the lungs every 6 (six) hours as needed for wheezing or shortness of breath.) 18 each 1   alendronate (FOSAMAX) 70 MG tablet Take 1 tablet (70 mg total) by mouth every 7 (seven) days. Take with a full glass of water on an empty stomach. 4 tablet 11   ALPRAZolam  (XANAX) 0.5 MG tablet TAKE 1 TABLET BY MOUTH TWICE A DAY AS NEEDED FOR ANXIETY (Patient taking differently: Take 0.5 mg by mouth 2 (two) times daily as needed for anxiety or sleep.) 60 tablet 0   aspirin EC 81 MG tablet Take 81 mg by mouth daily.     atorvastatin (LIPITOR) 80 MG tablet Take 1 tablet (80 mg total) by mouth every other day. 45 tablet 3   betamethasone valerate ointment (VALISONE) 0.1 % Apply 1 application topically 2 (two) times daily as needed (psoriasis). (DO NOT apply to face, groin or axilla) 30 g 0   budesonide-formoterol (SYMBICORT) 80-4.5 MCG/ACT inhaler Inhale 2 puffs into  the lungs 2 (two) times daily. 1 each 3   Calcium Carbonate-Vitamin D (SUPER CALCIUM 600 + D3 PO) Take 1 tablet by mouth daily.     ciprofloxacin (CIPRO) 500 MG tablet Take 1 tablet (500 mg total) by mouth 2 (two) times daily. One po bid x 7 days 28 tablet 0   cyclobenzaprine (FLEXERIL) 5 MG tablet TAKE 1 TABLET BY MOUTH THREE TIMES A DAY AS NEEDED FOR MUSCLE SPASMS 30 tablet 0   diclofenac Sodium (VOLTAREN) 1 % GEL Apply 2 g topically at bedtime.     diltiazem (CARDIZEM CD) 120 MG 24 hr capsule Take 1 capsule (120 mg total) by mouth daily. 90 capsule 3   fluticasone (FLONASE) 50 MCG/ACT nasal spray SPRAY 1 SPRAY INTO EACH NOSTRIL TWICE A DAY 48 g 1   furosemide (LASIX) 20 MG tablet TAKE 1 TABLET BY MOUTH EVERY DAY 30 tablet 4   HYDROcodone-acetaminophen (NORCO/VICODIN) 5-325 MG tablet Take 1 tablet by mouth every 6 (six) hours as needed for moderate pain. 20 tablet 0   hyoscyamine (LEVBID) 0.375 MG 12 hr tablet Take 1 tablet (0.375 mg total) by mouth 2 (two) times daily. 60 tablet 2   losartan (COZAAR) 25 MG tablet TAKE 1 TABLET BY MOUTH TWICE A DAY 180 tablet 0   meclizine (ANTIVERT) 25 MG tablet Take 1 tablet (25 mg total) by mouth 2 (two) times daily as needed for dizziness. Reported on 12/05/2015 30 tablet 1   metroNIDAZOLE (FLAGYL) 500 MG tablet Take 1 tablet (500 mg total) by mouth 4 (four) times daily.  One po bid x 7 days 56 tablet 0   nitroGLYCERIN (NITROSTAT) 0.4 MG SL tablet Place 1 tablet (0.4 mg total) under the tongue every 5 (five) minutes as needed for chest pain (x 3 doses). Reported on 12/05/2015 25 tablet 1   omeprazole (PRILOSEC) 20 MG capsule Take 1 capsule (20 mg total) by mouth daily. 90 capsule 1   polyethylene glycol powder (GLYCOLAX/MIRALAX) 17 GM/SCOOP powder Take 0.5 Containers by mouth daily.     potassium chloride (KLOR-CON 10) 10 MEQ tablet Take 1 tablet (10 mEq total) by mouth daily. 90 tablet 3   spironolactone (ALDACTONE) 25 MG tablet Take 0.5 tablets (12.5 mg total) by mouth 2 (two) times daily. 90 tablet 3   No current facility-administered medications for this visit.   Allergies  Allergen Reactions   Nifedipine Other (See Comments)    Brings blood pressure up really fast PROCARDIA   Tape     bleeding   Codeine Itching   Crestor [Rosuvastatin] Other (See Comments)    myalgias     Review of Systems: All systems reviewed and negative except where noted in HPI.    PHYSICAL EXAM :    Wt Readings from Last 3 Encounters:  02/08/21 252 lb (114.3 kg)  09/22/20 258 lb 6.4 oz (117.2 kg)  08/23/20 255 lb (115.7 kg)    BP 110/70   Pulse 80   Ht 5' 4.5" (1.638 m)   Wt 252 lb (114.3 kg)   BMI 42.59 kg/m  Constitutional:  Pleasant female in no acute distress. Psychiatric: Normal mood and affect. Behavior is normal. EENT: Pupils normal.  Conjunctivae are normal. No scleral icterus. Neck supple.  Cardiovascular: Normal rate, regular rhythm. No edema Pulmonary/chest: Effort normal and breath sounds normal. No wheezing, rales or rhonchi. Abdominal: Soft, nondistended, mild LLQ tenderness. Bowel sounds active throughout. There are no masses palpable. No hepatomegaly. Neurological: Alert and oriented to person place  and time. Skin: Skin is warm and dry. No rashes noted.  Tye Savoy, NP  02/08/2021, 10:39 AM

## 2021-02-08 NOTE — Patient Instructions (Addendum)
If you are age 76 or older, your body mass index should be between 23-30. Your Body mass index is 42.59 kg/m. If this is out of the aforementioned range listed, please consider follow up with your Primary Care Provider.  The [facility] providers would like to encourage you to use [facility] to communicate with providers for non-urgent requests or questions.  Due to long hold times on the telephone, sending your provider a message by [facility] may be faster and more efficient way to get a response. Please allow 48 business hours for a response.  Please remember that this is for non-urgent requests/questions.  Please stop taking the Cipro and Flagyl. Call us if recurrent abdominal pain.  Miralax- Dissolve one capful in 8 ounces of water and drink before bed.  It was great seeing you today! Thank you for entrusting me with your care and choosing [facility].  Tye Savoy, NP

## 2021-02-13 ENCOUNTER — Ambulatory Visit (INDEPENDENT_AMBULATORY_CARE_PROVIDER_SITE_OTHER): Payer: Medicare HMO | Admitting: Nurse Practitioner

## 2021-02-13 ENCOUNTER — Encounter: Payer: Self-pay | Admitting: Nurse Practitioner

## 2021-02-13 DIAGNOSIS — B373 Candidiasis of vulva and vagina: Secondary | ICD-10-CM

## 2021-02-13 DIAGNOSIS — B3731 Acute candidiasis of vulva and vagina: Secondary | ICD-10-CM

## 2021-02-13 MED ORDER — FLUCONAZOLE 150 MG PO TABS: 150.0000 mg | ORAL_TABLET | Freq: Once | ORAL | 0 refills | Status: AC

## 2021-02-13 NOTE — Progress Notes (Signed)
   Virtual Visit  Note Due to COVID-19 pandemic this visit was conducted virtually. This visit type was conducted due to national recommendations for restrictions regarding the COVID-19 Pandemic (e.g. social distancing, sheltering in place) in an effort to limit this patient's exposure and mitigate transmission in our community. All issues noted in this document were discussed and addressed.  A physical exam was not performed with this format.  I connected with Sara [name] on 02/13/21 at 3:16 by telephone and verified that I am speaking with the correct person using two identifiers. Sara [name] is currently located at home and no one is currently with her during visit. The provider, Mary-Margaret Hassell Done, FNP is located in their office at time of visit.  I discussed the limitations, risks, security and privacy concerns of performing an evaluation and management service by telephone and the availability of in person appointments. I also discussed with the patient that there may be a patient responsible charge related to this service. The patient expressed understanding and agreed to proceed.   History and Present Illness:   Chief Complaint: yeast infection  HPI Patient has been on antibiotic and now she has vaginal itching and discharge.     Review of Systems  Constitutional: Negative.  Negative for diaphoresis and weight loss.  Eyes:  Negative for blurred vision, double vision and pain.  Respiratory:  Negative for shortness of breath.   Cardiovascular:  Negative for chest pain, palpitations, orthopnea and leg swelling.  Gastrointestinal:  Negative for abdominal pain.  Skin:  Negative for rash.  Neurological:  Negative for dizziness, sensory change, loss of consciousness, weakness and headaches.  Endo/Heme/Allergies:  Negative for polydipsia. Does not bruise/bleed easily.  Psychiatric/Behavioral:  Negative for memory loss. The patient does not have insomnia.   All other  systems reviewed and are negative.   Observations/Objective: Alert and oriented- answers all questions appropriately No distress   Assessment and Plan: Sara [name] in today with chief complaint of Vaginitis   1. Vaginal candidiasis Avoid bubble baths  Meds ordered this encounter  Medications   fluconazole (DIFLUCAN) 150 MG tablet    Sig: Take 1 tablet (150 mg total) by mouth once for 1 dose.    Dispense:  1 tablet    Refill:  0    Order Specific Question:   Supervising Provider    Answer:   Caryl Pina A [id number]       Follow Up Instructions: prn    I discussed the assessment and treatment plan with the patient. The patient was provided an opportunity to ask questions and all were answered. The patient agreed with the plan and demonstrated an understanding of the instructions.   The patient was advised to call back or seek an in-person evaluation if the symptoms worsen or if the condition fails to improve as anticipated.  The above assessment and management plan was discussed with the patient. The patient verbalized understanding of and has agreed to the management plan. Patient is aware to call the clinic if symptoms persist or worsen. Patient is aware when to return to the clinic for a follow-up visit. Patient educated on when it is appropriate to go to the emergency department.   Time call ended:  3:27  I provided 11 minutes of  non face-to-face time during this encounter.    Mary-Margaret Hassell Done, FNP

## 2021-02-14 ENCOUNTER — Telehealth: Payer: Self-pay

## 2021-02-14 ENCOUNTER — Other Ambulatory Visit: Payer: Self-pay

## 2021-02-14 DIAGNOSIS — R935 Abnormal findings on diagnostic imaging of other abdominal regions, including retroperitoneum: Secondary | ICD-10-CM

## 2021-02-14 NOTE — Telephone Encounter (Signed)
Called to set up the appointment for the colonoscopy. No answer. Left her a message to call soon.  Tentatively have her with Pre-visit on 02/22/21 at 9:00 am and colonoscopy 03/10/21 at 11:30 am. Waiting to confirm with the patient. Order for the doppler u/s entered. Radiology scheduling will contact her directly to schedule this.

## 2021-02-14 NOTE — Telephone Encounter (Signed)
Spoke with the patient. She agrees to the appointments as scheduled.

## 2021-02-14 NOTE — Telephone Encounter (Signed)
-----   Message from [name], NP sent at 02/13/2021  4:01 PM EDT ----- [name]  I called patient and told her you would be calling in a couple of day. Will you schedule her for a colonoscopy with Carlean Purl ( sometimes in mid or late August) to evaluate diverticulitis. 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 Thanks

## 2021-02-24 ENCOUNTER — Encounter: Payer: Self-pay | Admitting: Family Medicine

## 2021-02-24 ENCOUNTER — Ambulatory Visit (INDEPENDENT_AMBULATORY_CARE_PROVIDER_SITE_OTHER): Payer: Medicare HMO | Admitting: Family Medicine

## 2021-02-24 ENCOUNTER — Other Ambulatory Visit: Payer: Self-pay

## 2021-02-24 VITALS — BP 116/79 | HR 79 | Temp 97.6°F | Ht 64.5 in | Wt 253.0 lb

## 2021-02-24 DIAGNOSIS — Z0001 Encounter for general adult medical examination with abnormal findings: Secondary | ICD-10-CM

## 2021-02-24 DIAGNOSIS — I152 Hypertension secondary to endocrine disorders: Secondary | ICD-10-CM

## 2021-02-24 DIAGNOSIS — E1159 Type 2 diabetes mellitus with other circulatory complications: Secondary | ICD-10-CM

## 2021-02-24 DIAGNOSIS — Z0000 Encounter for general adult medical examination without abnormal findings: Secondary | ICD-10-CM

## 2021-02-24 DIAGNOSIS — E1169 Type 2 diabetes mellitus with other specified complication: Secondary | ICD-10-CM

## 2021-02-24 DIAGNOSIS — L409 Psoriasis, unspecified: Secondary | ICD-10-CM

## 2021-02-24 DIAGNOSIS — F439 Reaction to severe stress, unspecified: Secondary | ICD-10-CM | POA: Diagnosis not present

## 2021-02-24 DIAGNOSIS — E119 Type 2 diabetes mellitus without complications: Secondary | ICD-10-CM | POA: Diagnosis not present

## 2021-02-24 DIAGNOSIS — L299 Pruritus, unspecified: Secondary | ICD-10-CM

## 2021-02-24 DIAGNOSIS — E785 Hyperlipidemia, unspecified: Secondary | ICD-10-CM | POA: Diagnosis not present

## 2021-02-24 DIAGNOSIS — Z23 Encounter for immunization: Secondary | ICD-10-CM

## 2021-02-24 DIAGNOSIS — R69 Illness, unspecified: Secondary | ICD-10-CM | POA: Diagnosis not present

## 2021-02-24 DIAGNOSIS — M810 Age-related osteoporosis without current pathological fracture: Secondary | ICD-10-CM

## 2021-02-24 LAB — BAYER DCA HB A1C WAIVED: HB A1C (BAYER DCA - WAIVED): 6.5 % (ref <7.0)

## 2021-02-24 MED ORDER — BETAMETHASONE VALERATE 0.1 % EX OINT: 1.0000 "application " | TOPICAL_OINTMENT | Freq: Two times a day (BID) | CUTANEOUS | 2 refills | Status: DC | PRN

## 2021-02-24 NOTE — Progress Notes (Signed)
Sara [name] is a 75 y.o. female presents to office today for annual physical exam examination.    Concerns today include:  1. Type 2 Diabetes (diet controlled) with hypertension, hyperlipidemia:  Admits that she has been eating some ice cream lately so she was not sure of the sugar would be well controlled or not.  She is compliant with her blood pressure medications, cholesterol medication.  No chest pain.  She has chronic shortness of breath on exertion and this has been present for many years.  Is unchanged.  She has appointment with Dr. Percival Spanish soon.  Last eye exam: needs.  Will be scheduling with Dr. Gillian Scarce soon. Last foot exam: UTD Last A1c:  Lab Results  Component Value Date   HGBA1C 6.5 08/23/2020   Nephropathy screen indicated?: UTD  2.  Stress at home Patient reports overall she does fairly well.  She does admit to some increased stress surrounding her granddaughter, who has resided with her for several years now after having quit school prior to graduating high school.  Her granddaughter is working at [employer] but her grandmother wants better for her and has encouraged her to take classes at [employer].  She otherwise notes that she does enjoy time with her grandchildren  3.  Ear itching/psoriasis Continues to experience ear itching bilaterally.  Not currently using any drops or medications for this.  She has known psoriasis.  She was using betamethasone valerate but notes it was over $45 for small little tube so she has not continued using this going forward.  She continues to have psoriasis along the feet bilaterally and elbows bilaterally.  Occupation: retired  Diet: not restricted, Exercise: no structured Last eye exam: needs Last dental exam: UTD Last colonoscopy: UTD Last mammogram: UTD Last pap smear: n/a Refills needed today:  Immunizations needed:Shingles Immunization History  Administered Date(s) Administered   Fluad Quad(high Dose 65+) 05/04/2019,  05/25/2020   Influenza Whole 04/22/2009   Influenza, High Dose Seasonal PF 05/01/2018   Influenza,inj,Quad PF,6+ Mos 04/25/2015   Influenza-Unspecified 05/18/2014, 07/05/2016, 05/13/2017   Moderna Sars-Covid-2 Vaccination 10/12/2019, 11/09/2019, 06/01/2020, 01/04/2021   Pneumococcal Conjugate-13 06/15/2014   Pneumococcal Polysaccharide-23 04/22/2008, 05/04/2019   Tdap 04/22/2013, 01/11/2021   Zoster, Live 08/03/2010     Past Medical History:  Diagnosis Date   Arthritis    Arthritis -hands -Bil. 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 s/p Xience DES x 3 to LAD;  b. nuc study 02/27/10: EF 67% no ischemia;  c.  echo 11/12: Mild LVH, EF [id number], grade 1 diastolic dysfunction, moderate LAE;  d.  LHC 07/03/11: LAD stents patent, RCA 25%, EF 55-65% ;  e. [place]. Myoview 5/14:  No ischemia, EF 68%   Diverticular disease    GERD (gastroesophageal reflux disease)    Hemorrhoid    Hepatitis    yellow jaundice as a child    HTN (hypertension)    Hx of echocardiogram    a.  Echo (05/2011):  Mild LVH, EF 55-60%, Gr 1 DD, mod LAE.b. Echo (11/14):  Mild LVH, mild focal basal septal hypertrophy, EF 55-60%, Gr 1 DD, mild LAE, atrial septal aneurysm   Hyperlipidemia    Hypertension    Impaired hearing    bilateral- no hearing aids   Jaundice    Myocardial infarction (HCC)    hx of  x 2    Obesity    OSA (obstructive sleep apnea)    needs no cpap per patient    Other psoriasis    ongoing- none at present.   Vertigo    Social History   Socioeconomic History   Marital status: Married    Spouse name: Not on file   Number of children: 3   Years of education: Not on file   Highest education level: Not on file  Occupational History   Occupation: hairdresser  Tobacco Use   Smoking status: Never   Smokeless tobacco: Never  Vaping Use   Vaping Use: Never used  Substance and Sexual  Activity   Alcohol use: No   Drug use: No   Sexual activity: Not Currently    Birth control/protection: Post-menopausal  Other Topics Concern   Not on file  Social History Narrative   Not on file   Social Determinants of Health   Financial Resource Strain: Not on file  Food Insecurity: Not on file  Transportation Needs: Not on file  Physical Activity: Not on file  Stress: Not on file  Social Connections: Not on file  Intimate Partner Violence: Not on file   Past Surgical History:  Procedure Laterality Date   ABDOMINAL HYSTERECTOMY     BACK SURGERY     Dr. Lawernce Pitts   CARDIAC CATHETERIZATION  08/23/2011   Ablation AV  Node   COLONOSCOPY     CORONARY ANGIOPLASTY     with stents 2009    KNEE SURGERY     right   LEFT AND RIGHT HEART CATHETERIZATION WITH CORONARY ANGIOGRAM N/A 08/18/2013   Procedure: LEFT AND RIGHT HEART CATHETERIZATION WITH CORONARY ANGIOGRAM;  Surgeon: Larey Dresser, MD;  Location: [facility] CATH LAB;  Service: Cardiovascular;  Laterality: N/A;   LEFT HEART CATHETERIZATION WITH CORONARY ANGIOGRAM N/A 07/03/2011   Procedure: LEFT HEART CATHETERIZATION WITH CORONARY ANGIOGRAM;  Surgeon: Minus Breeding, MD;  Location: [facility] CATH LAB;  Service: Cardiovascular;  Laterality: N/A;   REPAIR RECTOCELE     SUPRAVENTRICULAR TACHYCARDIA ABLATION N/A 08/23/2011   Procedure: SUPRAVENTRICULAR TACHYCARDIA ABLATION;  Surgeon: Evans Lance, MD;  Location: [facility] CATH LAB;  Service: Cardiovascular;  Laterality: N/A;   TOTAL HIP ARTHROPLASTY Right 02/23/2016   Procedure: RIGHT TOTAL HIP ARTHROPLASTY ANTERIOR APPROACH;  Surgeon: Rod Can, MD;  Location: [facility] ORS;  Service: Orthopedics;  Laterality: Right;   TOTAL KNEE ARTHROPLASTY Left 12/22/2012   Procedure: LEFT TOTAL KNEE ARTHROPLASTY;  Surgeon: Gearlean Alf, MD;  Location: [facility] ORS;  Service: Orthopedics;  Laterality: Left;   TUBAL LIGATION     UPPER GASTROINTESTINAL ENDOSCOPY     Family History  Problem Relation Age of Onset   Aneurysm  Father    Stroke Mother    Hypertension Mother    Heart failure Mother    Breast cancer Mother    Seizures Brother    Muscular dystrophy Daughter        dx as infant, passed age 10   Seizures Son    Colon cancer Neg Hx     Current Outpatient Medications:    albuterol (VENTOLIN HFA) 108 (90 Base) MCG/ACT inhaler, TAKE 2 PUFFS BY MOUTH EVERY 6 HOURS AS NEEDED FOR WHEEZE OR SHORTNESS OF BREATH (Patient taking differently: Inhale 2 puffs into the lungs every 6 (six) hours as needed for wheezing or shortness of breath.), Disp: 18 each, Rfl: 1   alendronate (FOSAMAX) 70 MG tablet, Take 1 tablet (70 mg total) by mouth  every 7 (seven) days. Take with a full glass of water on an empty stomach., Disp: 4 tablet, Rfl: 11   ALPRAZolam (XANAX) 0.5 MG tablet, TAKE 1 TABLET BY MOUTH TWICE A DAY AS NEEDED FOR ANXIETY (Patient taking differently: Take 0.5 mg by mouth 2 (two) times daily as needed for anxiety or sleep.), Disp: 60 tablet, Rfl: 0   aspirin EC 81 MG tablet, Take 81 mg by mouth daily., Disp: , Rfl:    atorvastatin (LIPITOR) 80 MG tablet, Take 1 tablet (80 mg total) by mouth every other day., Disp: 45 tablet, Rfl: 3   betamethasone valerate ointment (VALISONE) 0.1 %, Apply 1 application topically 2 (two) times daily as needed (psoriasis). (DO NOT apply to face, groin or axilla), Disp: 30 g, Rfl: 0   budesonide-formoterol (SYMBICORT) 80-4.5 MCG/ACT inhaler, Inhale 2 puffs into the lungs 2 (two) times daily., Disp: 1 each, Rfl: 3   Calcium Carbonate-Vitamin D (SUPER CALCIUM 600 + D3 PO), Take 1 tablet by mouth daily., Disp: , Rfl:    ciprofloxacin (CIPRO) 500 MG tablet, Take 1 tablet (500 mg total) by mouth 2 (two) times daily. One po bid x 7 days, Disp: 28 tablet, Rfl: 0   cyclobenzaprine (FLEXERIL) 5 MG tablet, TAKE 1 TABLET BY MOUTH THREE TIMES A DAY AS NEEDED FOR MUSCLE SPASMS, Disp: 30 tablet, Rfl: 0   diclofenac Sodium (VOLTAREN) 1 % GEL, Apply 2 g topically at bedtime., Disp: , Rfl:     diltiazem (CARDIZEM CD) 120 MG 24 hr capsule, Take 1 capsule (120 mg total) by mouth daily., Disp: 90 capsule, Rfl: 3   fluticasone (FLONASE) 50 MCG/ACT nasal spray, SPRAY 1 SPRAY INTO EACH NOSTRIL TWICE A DAY, Disp: 48 g, Rfl: 1   furosemide (LASIX) 20 MG tablet, TAKE 1 TABLET BY MOUTH EVERY DAY, Disp: 30 tablet, Rfl: 4   HYDROcodone-acetaminophen (NORCO/VICODIN) 5-325 MG tablet, Take 1 tablet by mouth every 6 (six) hours as needed for moderate pain., Disp: 20 tablet, Rfl: 0   hyoscyamine (LEVBID) 0.375 MG 12 hr tablet, Take 1 tablet (0.375 mg total) by mouth 2 (two) times daily., Disp: 60 tablet, Rfl: 2   losartan (COZAAR) 25 MG tablet, TAKE 1 TABLET BY MOUTH TWICE A DAY, Disp: 180 tablet, Rfl: 0   meclizine (ANTIVERT) 25 MG tablet, Take 1 tablet (25 mg total) by mouth 2 (two) times daily as needed for dizziness. Reported on 12/05/2015, Disp: 30 tablet, Rfl: 1   metroNIDAZOLE (FLAGYL) 500 MG tablet, Take 1 tablet (500 mg total) by mouth 4 (four) times daily. One po bid x 7 days, Disp: 56 tablet, Rfl: 0   nitroGLYCERIN (NITROSTAT) 0.4 MG SL tablet, Place 1 tablet (0.4 mg total) under the tongue every 5 (five) minutes as needed for chest pain (x 3 doses). Reported on 12/05/2015, Disp: 25 tablet, Rfl: 1   omeprazole (PRILOSEC) 20 MG capsule, Take 1 capsule (20 mg total) by mouth daily., Disp: 90 capsule, Rfl: 1   polyethylene glycol powder (GLYCOLAX/MIRALAX) 17 GM/SCOOP powder, Take 0.5 Containers by mouth daily., Disp: , Rfl:    potassium chloride (KLOR-CON 10) 10 MEQ tablet, Take 1 tablet (10 mEq total) by mouth daily., Disp: 90 tablet, Rfl: 3   spironolactone (ALDACTONE) 25 MG tablet, Take 0.5 tablets (12.5 mg total) by mouth 2 (two) times daily., Disp: 90 tablet, Rfl: 3  Allergies  Allergen Reactions   Nifedipine Other (See Comments)    Brings blood pressure up really fast PROCARDIA   Tape  bleeding   Codeine Itching   Crestor [Rosuvastatin] Other (See Comments)    myalgias      ROS: Review of Systems Pertinent items noted in HPI and remainder of comprehensive ROS otherwise negative.    Physical exam BP 116/79   Pulse 79   Temp 97.6 F (36.4 C)   Ht 5' 4.5" (1.638 m)   Wt 253 lb (114.8 kg)   SpO2 (!) 86%   BMI 42.76 kg/m  General appearance: alert, cooperative, appears stated age, and morbidly obese Head: Normocephalic, without obvious abnormality, atraumatic Eyes: negative findings: lids and lashes normal, conjunctivae and sclerae normal, corneas clear, and pupils equal, round, reactive to light and accomodation Ears: normal TM's and external auditory canal with slight dermatitis Nose: Nares normal. Septum midline. Mucosa normal. No drainage or sinus tenderness. Throat:  Previous caps noted.  Oropharynx without masses Neck: no adenopathy, no carotid bruit, supple, symmetrical, trachea midline, and thyroid not enlarged, symmetric, no tenderness/mass/nodules Back: symmetric, no curvature. ROM normal. No CVA tenderness. Lungs: clear to auscultation bilaterally Heart: regular rate and rhythm, S1, S2 normal, no murmur, click, rub or gallop Abdomen: soft, non-tender; bowel sounds normal; no masses,  no organomegaly Extremities: extremities normal, atraumatic, no cyanosis or edema Pulses: 2+ and symmetric Skin: Psoriatic plaques noted along bilateral elbows and bilateral arches of feet.  Mild dermatitis noted in the external auditory canal Lymph nodes: Cervical, supraclavicular, and axillary nodes normal. Neurologic: Alert and oriented X 3, normal strength and tone. Normal symmetric reflexes. Normal coordination and gait Psych: Mood stable, speech normal Depression screen [facility] 2/9 02/24/2021 08/23/2020 02/03/2019 01/28/2019 08/07/2018  Decreased Interest 2 0 0 0 0  Down, Depressed, Hopeless 0 0 0 0 0  PHQ - 2 Score 2 0 0 0 0  Altered sleeping 0 0 - - 2  Tired, decreased energy 2 0 - - 3  Change in appetite 2 0 - - 0  Feeling bad or failure about yourself  0 0 - -  0  Trouble concentrating 2 0 - - 0  Moving slowly or fidgety/restless 0 0 - - 0  Suicidal thoughts 0 0 - - 0  PHQ-9 Score 8 0 - - 5  Difficult doing work/chores - - - - Somewhat difficult  Some recent data might be hidden   GAD 7 : Generalized Anxiety Score 02/24/2021 05/21/2017  Nervous, Anxious, on Edge 1 0  Control/stop worrying 1 0  Worry too much - different things 1 0  Trouble relaxing 2 0  Restless 0 0  Easily annoyed or irritable 1 0  Afraid - awful might happen 1 0  Total GAD 7 Score 7 0  Anxiety Difficulty - Not difficult at all   Assessment/ Plan: Sara [name] here for annual physical exam.   Annual physical exam  Diet-controlled diabetes mellitus ([place]) - Plan: Bayer DCA Hb A1c Waived, Lipid Panel  Hypertension associated with diabetes ([place])  Hyperlipidemia associated with type 2 diabetes mellitus ([place]) - Plan: Lipid Panel  Age-related osteoporosis without current pathological fracture - Plan: VITAMIN D 25 Hydroxy (Vit-D Deficiency, Fractures)  Psoriasis - Plan: betamethasone valerate ointment (VALISONE) 0.1 %  Ear itching  Stress at home - Plan: AMB Referral to [place]  Shingles vaccination was administered today.  A1c shows diet-controlled diabetes.  Reinforced good lifestyle choices, adequate exercise  Blood pressure well controlled.  No changes  Continue statin.  Fasting lipid panel obtained  Vitamin D level ordered.  Continue Fosamax  Betamethasone  was renewed for her psoriasis and I have given her a good Rx coupon to help offset some of the cost  With regards to her ear itching, it appears that she has a mild dermatitis going on.  We discussed OTC remedies to assist with this.  She does have stress at home with regards to the granddaughter who resides with her.  We will have CCM reach out to her for any stress management.  Counseled on healthy lifestyle choices, including diet (rich in fruits, vegetables and lean meats and  low in salt and simple carbohydrates) and exercise (at least 30 minutes of moderate physical activity daily).  Patient to follow up in 6 months  Jezebel Pollet M. Lajuana Ripple, DO

## 2021-02-25 LAB — VITAMIN D 25 HYDROXY (VIT D DEFICIENCY, FRACTURES): Vit D, 25-Hydroxy: 37.7 ng/mL (ref 30.0–100.0)

## 2021-02-27 ENCOUNTER — Other Ambulatory Visit: Payer: Self-pay

## 2021-02-27 ENCOUNTER — Telehealth: Payer: Self-pay

## 2021-02-27 ENCOUNTER — Ambulatory Visit (HOSPITAL_COMMUNITY)
Admission: RE | Admit: 2021-02-27 | Discharge: 2021-02-27 | Disposition: A | Discharge status: Home / Self Care | Payer: Medicare HMO | Source: Ambulatory Visit | Attending: Nurse Practitioner | Admitting: Nurse Practitioner

## 2021-02-27 DIAGNOSIS — R935 Abnormal findings on diagnostic imaging of other abdominal regions, including retroperitoneum: Secondary | ICD-10-CM | POA: Diagnosis not present

## 2021-02-27 DIAGNOSIS — I81 Portal vein thrombosis: Secondary | ICD-10-CM | POA: Diagnosis not present

## 2021-02-27 NOTE — Chronic Care Management (AMB) (Signed)
  Chronic Care Management   Note  02/27/2021 Name: Raeonna Milo MRN: [id number] DOB: 11-25-1945  Nely Anjanette Gilkey is a 76 y.o. year old female who is a primary care patient of Janora Norlander, DO. I reached out to [employer] by phone today in response to a referral sent by Ms. Amena Mancel Bale Schriver's PCP, Janora Norlander, DO      Ms. Robicheaux was given information about Chronic Care Management services today including:  CCM service includes personalized support from designated clinical staff supervised by her physician, including individualized plan of care and coordination with other care providers 24/7 contact phone numbers for assistance for urgent and routine care needs. Service will only be billed when office clinical staff spend 20 minutes or more in a month to coordinate care. Only one practitioner may furnish and bill the service in a calendar month. The patient may stop CCM services at any time (effective at the end of the month) by phone call to the office staff. The patient will be responsible for cost sharing (co-pay) of up to 20% of the service fee (after annual deductible is met).  Patient did not agree to enrollment in care management services and does not wish to consider at this time.  Follow up plan: Patient declines further follow up engagement by the care management team. Appropriate care team members and provider have been notified via electronic communication.   Noreene Larsson, [place], [place], [place] [address] Direct Dial: [phone] [email or web address] Website: [email or web address]

## 2021-02-28 ENCOUNTER — Telehealth: Payer: Self-pay | Admitting: *Deleted

## 2021-02-28 ENCOUNTER — Ambulatory Visit (AMBULATORY_SURGERY_CENTER): Payer: Medicare HMO | Admitting: *Deleted

## 2021-02-28 ENCOUNTER — Other Ambulatory Visit: Payer: Self-pay

## 2021-02-28 VITALS — Ht 64.0 in | Wt 253.0 lb

## 2021-02-28 DIAGNOSIS — L03031 Cellulitis of right toe: Secondary | ICD-10-CM | POA: Diagnosis not present

## 2021-02-28 DIAGNOSIS — R935 Abnormal findings on diagnostic imaging of other abdominal regions, including retroperitoneum: Secondary | ICD-10-CM

## 2021-02-28 DIAGNOSIS — M79674 Pain in right toe(s): Secondary | ICD-10-CM | POA: Diagnosis not present

## 2021-02-28 LAB — LIPID PANEL
Chol/HDL Ratio: 3.5 ratio (ref 0.0–4.4)
Cholesterol, Total: 179 mg/dL (ref 100–199)
HDL: 51 mg/dL (ref >39)
LDL Chol Calc (NIH): 114 mg/dL — ABNORMAL HIGH (ref 0–99)
Triglycerides: 75 mg/dL (ref 0–149)
VLDL Cholesterol Cal: 14 mg/dL (ref 5–40)

## 2021-02-28 LAB — SPECIMEN STATUS REPORT

## 2021-02-28 NOTE — Progress Notes (Signed)
Pt's previsit is done over the phone and all paperwork (prep instructions, blank consent form to just read over) sent to patient.  Pt's name and DOB verified at the beginning of the previsit.  Pt denies any difficulty with ambulating.   Pt denies constipation at this time   No trouble with anesthesia, denies being told they were difficult to intubate, or hx/fam hx of malignant hyperthermia per pt.  Pt states she is SOB "all the time, even with moving around just a little bit."  Does not wear oxygen at home, but uses inhalers.  Dr. Carlean Purl made aware of this.   No egg or soy allergy  No home oxygen use   No diet medications taken

## 2021-02-28 NOTE — Telephone Encounter (Signed)
Dr. Carlean Purl,  I just wanted to make you aware of this-  Pt states she is SOB "all the time, even with moving around just a little bit."  Does not wear oxygen at home, but uses inhalers. I just wanted you know before her procedure, which is 03-10-21.  Thanks, [employer]

## 2021-03-01 ENCOUNTER — Encounter: Payer: Self-pay | Admitting: Internal Medicine

## 2021-03-01 NOTE — Telephone Encounter (Signed)
I called the patient and reviewed the chart.  She does have chronic dyspnea on exertion she does not seem to have orthopnea though she is a side sleeper.  We both think her obesity is most likely the culprit for her chronic dyspnea.  She did have COVID last year and uses a as needed inhaler since then but much less than she used to and those issues are better.  She did not see Dr. Percival Spanish last year but is seeing him 1 week from today which is 2 days prior to her colonoscopy.  I doubt he would say she needs testing prior to her colonoscopy so we will keep it scheduled but I have asked her to mention this to Dr. Percival Spanish and I am copying him so that if he think she needs any preprocedural testing before her colonoscopy we can then adjust and reschedule.

## 2021-03-06 NOTE — Progress Notes (Addendum)
Cardiology Office Note   Date:  03/08/2021   ID:  Sara, [name] [birth year], MRN [id number]  PCP:  Janora Norlander, DO  Cardiologist:   Minus Breeding, MD  Referring:  Janora Norlander, DO   CC:  SOB    History of Present Illness: Sara [name] is a 75 y.o. female who presents for follow up of CAD and AVNRT.  She had PCI to the LAD in 10/10.  In 12/12, she was admitted to [facility] with SVT and associated chest pain and elevated troponin.  Cath in 12/12 showed patent LAD stents. She had a right and a left heart cath in 2015 for dyspnea.  She had patent arteries.  She had normal right heart pressures.    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  She had Adenosine Cardiolite in 08/09/16  with no evidence for ischemia or infarction.     Since I last saw her she has had no new cardiovascular symptoms.  I do see that she is having a GI work-up and colonoscopy.  I saw a CT that mentions a splenorenal shunt.  She is very limited she says her activity because of back pain.  She does have chronic shortness of breath.  However, I will back her previous notes and this seems to have been a longstanding complaint for which we did a heart catheterization in 2015 and a stress test in 2018.  On careful questioning she does not really think this is any different.  She short of breath going up a flight of stairs.  She does have some orthopnea but this is not consistent.  She has not describing nightly PND or orthopnea.  She is not having any new chest pressure, neck or arm discomfort.  Her weights have been relatively stable and she is morbidly obese.  She does not have chest pain that causes her to take nitroglycerin.    Past Medical History:  Diagnosis Date   Arthritis    Arthritis -hands -Bil. knee replacemnts   AVNRT (AV nodal re-entry tachycardia) ([place])    s/p RFCA 09/2011   Bronchitis    none recent   CAD (coronary artery disease)    a. s/p Xience DES x 3 to LAD;  b. nuc study  02/27/10: EF 67% no ischemia;  c.  echo 11/12: Mild LVH, EF [id number], grade 1 diastolic dysfunction, moderate LAE;  d.  LHC 07/03/11: LAD stents patent, RCA 25%, EF 55-65% ;  e. Adeno. Myoview 5/14:  No ischemia, EF 68%   COVID-19 2021   Depression    Diverticular disease    GERD (gastroesophageal reflux disease)    Heart murmur    Hemorrhoid    Hepatitis    yellow jaundice as a child    HTN (hypertension)    Hx of echocardiogram    a.  Echo (05/2011):  Mild LVH, EF 55-60%, Gr 1 DD, mod LAE.b. Echo (11/14):  Mild LVH, mild focal basal septal hypertrophy, EF 55-60%, Gr 1 DD, mild LAE, atrial septal aneurysm   Hyperlipidemia    Hypertension    Impaired hearing    bilateral- no hearing aids   Jaundice    Myocardial infarction (HCC)    hx of x 2 3-4 years ago per pt   Obesity    OSA (obstructive sleep apnea)    needs no cpap per patient pt denies   Other psoriasis    ongoing- none at present.   Sleep apnea  Vertigo     Past Surgical History:  Procedure Laterality Date   ABDOMINAL HYSTERECTOMY     BACK SURGERY     Dr. Lawernce Pitts   CARDIAC CATHETERIZATION  08/23/2011   Ablation AV  Node   CATARACT EXTRACTION Bilateral    COLONOSCOPY     CORONARY ANGIOPLASTY     with stents 2009    KNEE SURGERY     right   LEFT AND RIGHT HEART CATHETERIZATION WITH CORONARY ANGIOGRAM N/A 08/18/2013   Procedure: LEFT AND RIGHT HEART CATHETERIZATION WITH CORONARY ANGIOGRAM;  Surgeon: Larey Dresser, MD;  Location: [facility] CATH LAB;  Service: Cardiovascular;  Laterality: N/A;   LEFT HEART CATHETERIZATION WITH CORONARY ANGIOGRAM N/A 07/03/2011   Procedure: LEFT HEART CATHETERIZATION WITH CORONARY ANGIOGRAM;  Surgeon: Minus Breeding, MD;  Location: [facility] CATH LAB;  Service: Cardiovascular;  Laterality: N/A;   REPAIR RECTOCELE     SUPRAVENTRICULAR TACHYCARDIA ABLATION N/A 08/23/2011   Procedure: SUPRAVENTRICULAR TACHYCARDIA ABLATION;  Surgeon: Evans Lance, MD;  Location[facility] LAB;  Service: Cardiovascular;   Laterality: N/A;   TOTAL HIP ARTHROPLASTY Right 02/23/2016   Procedure: RIGHT TOTAL HIP ARTHROPLASTY ANTERIOR APPROACH;  Surgeon: Rod Can, MD;  Location: [facility] ORS;  Service: Orthopedics;  Laterality: Right;   TOTAL KNEE ARTHROPLASTY Left 12/22/2012   Procedure: LEFT TOTAL KNEE ARTHROPLASTY;  Surgeon: Gearlean Alf, MD;  Location: [facility] ORS;  Service: Orthopedics;  Laterality: Left;   TUBAL LIGATION     UPPER GASTROINTESTINAL ENDOSCOPY       Current Outpatient Medications  Medication Sig Dispense Refill   albuterol (VENTOLIN HFA) 108 (90 Base) MCG/ACT inhaler TAKE 2 PUFFS BY MOUTH EVERY 6 HOURS AS NEEDED FOR WHEEZE OR SHORTNESS OF BREATH (Patient taking differently: Inhale 2 puffs into the lungs every 6 (six) hours as needed for wheezing or shortness of breath.) 18 each 1   alendronate (FOSAMAX) 70 MG tablet Take 1 tablet (70 mg total) by mouth every 7 (seven) days. Take with a full glass of water on an empty stomach. 4 tablet 11   ALPRAZolam (XANAX) 0.5 MG tablet TAKE 1 TABLET BY MOUTH TWICE A DAY AS NEEDED FOR ANXIETY 60 tablet 0   aspirin EC 81 MG tablet Take 81 mg by mouth daily.     atorvastatin (LIPITOR) 80 MG tablet Take 1 tablet (80 mg total) by mouth every other day. 45 tablet 3   betamethasone valerate ointment (VALISONE) 0.1 % Apply 1 application topically 2 (two) times daily as needed (psoriasis). (DO NOT apply to face, groin or axilla) 45 g 2   budesonide-formoterol (SYMBICORT) 80-4.5 MCG/ACT inhaler Inhale 2 puffs into the lungs 2 (two) times daily. 1 each 3   Calcium Carbonate-Vitamin D (SUPER CALCIUM 600 + D3 PO) Take 1 tablet by mouth daily.     cyclobenzaprine (FLEXERIL) 5 MG tablet TAKE 1 TABLET BY MOUTH THREE TIMES A DAY AS NEEDED FOR MUSCLE SPASMS 30 tablet 0   diltiazem (CARDIZEM CD) 120 MG 24 hr capsule Take 1 capsule (120 mg total) by mouth daily. 90 capsule 3   fluticasone (FLONASE) 50 MCG/ACT nasal spray SPRAY 1 SPRAY INTO EACH NOSTRIL TWICE A DAY 48 g 1    furosemide (LASIX) 20 MG tablet TAKE 1 TABLET BY MOUTH EVERY DAY 30 tablet 4   losartan (COZAAR) 25 MG tablet TAKE 1 TABLET BY MOUTH TWICE A DAY 180 tablet 0   meclizine (ANTIVERT) 25 MG tablet Take 1 tablet (25 mg total) by mouth 2 (two) times  daily as needed for dizziness. Reported on 12/05/2015 30 tablet 1   nitroGLYCERIN (NITROSTAT) 0.4 MG SL tablet Place 1 tablet (0.4 mg total) under the tongue every 5 (five) minutes as needed for chest pain (x 3 doses). Reported on 12/05/2015 25 tablet 1   polyethylene glycol powder (GLYCOLAX/MIRALAX) 17 GM/SCOOP powder Take 0.5 Containers by mouth daily.     potassium chloride (KLOR-CON 10) 10 MEQ tablet Take 1 tablet (10 mEq total) by mouth daily. 90 tablet 3   spironolactone (ALDACTONE) 25 MG tablet Take 0.5 tablets (12.5 mg total) by mouth 2 (two) times daily. 90 tablet 3   omeprazole (PRILOSEC) 20 MG capsule TAKE 1 CAPSULE BY MOUTH EVERY DAY 90 capsule 1   No current facility-administered medications for this visit.    Allergies:   Nifedipine, Tape, Codeine, and Crestor [rosuvastatin]    ROS:  Please see the history of present illness.   Otherwise, review of systems are positive for none.   All other systems are reviewed and negative.    PHYSICAL EXAM: VS:  BP 120/68   Pulse 75   Ht 5' 4.5" (1.638 m)   Wt 253 lb (114.8 kg)   SpO2 95%   BMI 42.76 kg/m  , BMI Body mass index is 42.76 kg/m.  GENERAL:  Well appearing NECK:  No jugular venous distention, waveform within normal limits, carotid upstroke brisk and symmetric, no bruits, no thyromegaly LUNGS:  Clear to auscultation bilaterally CHEST:  Unremarkable HEART:  PMI not displaced or sustained,S1 and S2 within normal limits, no S3, no S4, no clicks, no rubs, no murmurs ABD:  Flat, positive bowel sounds normal in frequency in pitch, no bruits, no rebound, no guarding, no midline pulsatile mass, no hepatomegaly, no splenomegaly EXT:  2 plus pulses throughout, no edema, no cyanosis no  clubbing  EKG:  EKG is  ordered today. Sinus rhythm, rate 75, left axis deviation, interventricular conduction delay.  No change from previous.  Recent Labs: 01/20/2021: ALT 20; BUN 9; Creatinine, Ser 0.63; Hemoglobin 13.8; Platelets 189; Potassium 3.9; Sodium 134    Lipid Panel    Component Value Date/Time   CHOL 179 02/24/2021 0901   TRIG 75 02/24/2021 0901   HDL 51 02/24/2021 0901   CHOLHDL 3.5 02/24/2021 0901   CHOLHDL 3.0 07/11/2016 1507   VLDL 13 07/11/2016 1507   LDLCALC 114 (H) 02/24/2021 0901      Wt Readings from Last 3 Encounters:  03/08/21 253 lb (114.8 kg)  02/28/21 253 lb (114.8 kg)  02/24/21 253 lb (114.8 kg)      Other studies Reviewed: Additional studies/ records that were reviewed today include: Labs    Review of the above records demonstrates: See elsewhere   ASSESSMENT AND PLAN:   CAD:   The patient has no new sypmtoms.  No further cardiovascular testing is indicated.  We will continue with aggressive risk reduction and meds as listed.I do not think her shortness of breath represents new angina.  Of note she would not want to take another perfusion study because she apparently had to get aminophylline at the last 1.   AVNRT:   She is had no recurrent arrhythmia.  No change in therapy.   OBESITY: This is her most significant health problem and we talked about this.  DYSPNEA: This seems to be chronic.  I will check a BNP level but I suspect this is secondary to weight and deconditioning which was a determination previously and it has not changed.  LBBB: This is chronic.  No change in therapy.  DYSLIPIDEMIA: Her LDL is 114 with an HDL of 51.  She has not tolerated Crestor.  She has been referred to pharmacy for possible PCSK9.   HTN:   Her BP is at target.  No change in therapy.  Current medicines are reviewed at length with the patient today.  The patient does not have concerns regarding medicines.  The following changes have been made:     None  Labs/ tests ordered today include:     Orders Placed This Encounter  Procedures   Pro b natriuretic peptide (BNP)   EKG 12-Lead      Disposition:   FU with me in 12 months.     Signed, Minus Breeding, MD  03/08/2021 12:23 PM     [facility]

## 2021-03-08 ENCOUNTER — Other Ambulatory Visit: Payer: Medicare HMO

## 2021-03-08 ENCOUNTER — Encounter: Payer: Self-pay | Admitting: Cardiology

## 2021-03-08 ENCOUNTER — Other Ambulatory Visit: Payer: Self-pay | Admitting: Family Medicine

## 2021-03-08 ENCOUNTER — Other Ambulatory Visit: Payer: Self-pay

## 2021-03-08 ENCOUNTER — Ambulatory Visit: Payer: Medicare HMO | Admitting: Cardiology

## 2021-03-08 VITALS — BP 120/68 | HR 75 | Ht 64.5 in | Wt 253.0 lb

## 2021-03-08 DIAGNOSIS — I471 Supraventricular tachycardia: Secondary | ICD-10-CM

## 2021-03-08 DIAGNOSIS — R0602 Shortness of breath: Secondary | ICD-10-CM

## 2021-03-08 DIAGNOSIS — E785 Hyperlipidemia, unspecified: Secondary | ICD-10-CM

## 2021-03-08 DIAGNOSIS — I447 Left bundle-branch block, unspecified: Secondary | ICD-10-CM

## 2021-03-08 DIAGNOSIS — I10 Essential (primary) hypertension: Secondary | ICD-10-CM

## 2021-03-08 DIAGNOSIS — I2510 Atherosclerotic heart disease of native coronary artery without angina pectoris: Secondary | ICD-10-CM

## 2021-03-08 NOTE — Telephone Encounter (Signed)
Sara Mayer, MD  Laverna Peace, RN You had sent me a phone note about her earlier.  Her cardiologist says she is fine to be scoped.        Previous Messages   ----- Message -----  From: Minus Breeding, MD  Sent: 03/08/2021  12:16 PM EDT  To: Sara Mayer, MD   OK for scope.    Above converted from staff message

## 2021-03-08 NOTE — Patient Instructions (Signed)
Medication Instructions:  The current medical regimen is effective;  continue present plan and medications.  *If you need a refill on your cardiac medications before your next appointment, please call your pharmacy*  Lab Work: Please have blood work at [facility] (Pro-BNP)  If you have labs (blood work) drawn today and your tests are completely normal, you will receive your results only by: [place] (if you have MyChart) OR A paper copy in the mail If you have any lab test that is abnormal or we need to change your treatment, we will call you to review the results.  Follow-Up: At [facility], you and your health needs are our priority.  As part of our continuing mission to provide you with exceptional heart care, we have created designated Provider Care Teams.  These Care Teams include your primary Cardiologist (physician) and Advanced Practice Providers (APPs -  Physician Assistants and Nurse Practitioners) who all work together to provide you with the care you need, when you need it.  We recommend signing up for the patient portal called "MyChart".  Sign up information is provided on this After Visit Summary.  MyChart is used to connect with patients for Virtual Visits (Telemedicine).  Patients are able to view lab/test results, encounter notes, upcoming appointments, etc.  Non-urgent messages can be sent to your provider as well.   To learn more about what you can do with MyChart, go to [email or web address].    Your next appointment:   1 year(s)  The format for your next appointment:   In Person  Provider:   Minus Breeding, MD   Thank you for choosing [facility]!!

## 2021-03-09 LAB — PRO B NATRIURETIC PEPTIDE: NT-Pro BNP: 62 pg/mL (ref 0–738)

## 2021-03-10 ENCOUNTER — Encounter: Payer: Self-pay | Admitting: Internal Medicine

## 2021-03-10 ENCOUNTER — Encounter: Payer: Self-pay | Admitting: *Deleted

## 2021-03-10 ENCOUNTER — Ambulatory Visit (AMBULATORY_SURGERY_CENTER): Payer: Medicare HMO | Admitting: Internal Medicine

## 2021-03-10 ENCOUNTER — Other Ambulatory Visit: Payer: Self-pay

## 2021-03-10 VITALS — BP 94/59 | HR 68 | Temp 98.4°F | Resp 14 | Ht 64.0 in | Wt 253.0 lb

## 2021-03-10 DIAGNOSIS — K648 Other hemorrhoids: Secondary | ICD-10-CM | POA: Diagnosis not present

## 2021-03-10 DIAGNOSIS — G4733 Obstructive sleep apnea (adult) (pediatric): Secondary | ICD-10-CM | POA: Diagnosis not present

## 2021-03-10 DIAGNOSIS — I2510 Atherosclerotic heart disease of native coronary artery without angina pectoris: Secondary | ICD-10-CM | POA: Diagnosis not present

## 2021-03-10 DIAGNOSIS — I10 Essential (primary) hypertension: Secondary | ICD-10-CM | POA: Diagnosis not present

## 2021-03-10 DIAGNOSIS — Z1211 Encounter for screening for malignant neoplasm of colon: Secondary | ICD-10-CM | POA: Diagnosis not present

## 2021-03-10 DIAGNOSIS — K5730 Diverticulosis of large intestine without perforation or abscess without bleeding: Secondary | ICD-10-CM | POA: Diagnosis not present

## 2021-03-10 MED ORDER — SODIUM CHLORIDE 0.9 % IV SOLN: 500.0000 mL | Freq: Once | INTRAVENOUS | Status: DC

## 2021-03-10 NOTE — Patient Instructions (Addendum)
No polyps or cancer were seen.  I did see your diverticulosis and your hemorrhoids were a bit swollen.  I appreciate the opportunity to care for you. Gatha Mayer, MD, [facility]   Thank you for letting us take care of your healthcare needs today. Please see handouts given to you on Diverticulosis and Hemorrhoids.   YOU HAD AN ENDOSCOPIC PROCEDURE TODAY AT [facility]:   Refer to the procedure report that was given to you for any specific questions about what was found during the examination.  If the procedure report does not answer your questions, please call your gastroenterologist to clarify.  If you requested that your care partner not be given the details of your procedure findings, then the procedure report has been included in a sealed envelope for you to review at your convenience later.  YOU SHOULD EXPECT: Some feelings of bloating in the abdomen. Passage of more gas than usual.  Walking can help get rid of the air that was put into your GI tract during the procedure and reduce the bloating. If you had a lower endoscopy (such as a colonoscopy or flexible sigmoidoscopy) you may notice spotting of blood in your stool or on the toilet paper. If you underwent a bowel prep for your procedure, you may not have a normal bowel movement for a few days.  Please Note:  You might notice some irritation and congestion in your nose or some drainage.  This is from the oxygen used during your procedure.  There is no need for concern and it should clear up in a day or so.  SYMPTOMS TO REPORT IMMEDIATELY:  Following lower endoscopy (colonoscopy or flexible sigmoidoscopy):  Excessive amounts of blood in the stool  Significant tenderness or worsening of abdominal pains  Swelling of the abdomen that is new, acute  Fever of 100F or higher  Following upper endoscopy (EGD)  Vomiting of blood or coffee ground material  New chest pain or pain under the shoulder blades  Painful or  persistently difficult swallowing  New shortness of breath  Fever of 100F or higher  Black, tarry-looking stools  For urgent or emergent issues, a gastroenterologist can be reached at any hour by calling [phone]. Do not use MyChart messaging for urgent concerns.    DIET:  We do recommend a small meal at first, but then you may proceed to your regular diet.  Drink plenty of fluids but you should avoid alcoholic beverages for 24 hours.  ACTIVITY:  You should plan to take it easy for the rest of today and you should NOT DRIVE or use heavy machinery until tomorrow (because of the sedation medicines used during the test).    FOLLOW UP: Our staff will call the number listed on your records 48-72 hours following your procedure to check on you and address any questions or concerns that you may have regarding the information given to you following your procedure. If we do not reach you, we will leave a message.  We will attempt to reach you two times.  During this call, we will ask if you have developed any symptoms of COVID 19. If you develop any symptoms (ie: fever, flu-like symptoms, shortness of breath, cough etc.) before then, please call [phone].  If you test positive for Covid 19 in the 2 weeks post procedure, please call and report this information to [place].    If any biopsies were taken you will be contacted by phone or by letter  within the next 1-3 weeks.  Please call us at [phone] if you have not heard about the biopsies in 3 weeks.    SIGNATURES/CONFIDENTIALITY: You and/or your care partner have signed paperwork which will be entered into your electronic medical record.  These signatures attest to the fact that that the information above on your After Visit Summary has been reviewed and is understood.  Full responsibility of the confidentiality of this discharge information lies with you and/or your care-partner.

## 2021-03-10 NOTE — Progress Notes (Signed)
[facility] History and Physical   Primary Care Physician:  Janora Norlander, DO   Reason for Procedure:   Colon cancer screenig  Plan:    colonoscopy     HPI: Sara [name] is a 75 y.o. female w/ hx diverticulitis earlier this year. Now here for screening colonoscopy. She has chronic dyspnea unchanged and recently saw cardiology and no contraindications for colonoscopy.   Past Medical History:  Diagnosis Date   Arthritis    Arthritis -hands -Bil. knee replacemnts   AVNRT (AV nodal re-entry tachycardia) ([place])    s/p RFCA 09/2011   Bronchitis    none recent   CAD (coronary artery disease)    a. s/p Xience DES x 3 to LAD;  b. nuc study 02/27/10: EF 67% no ischemia;  c.  echo 11/12: Mild LVH, EF [id number], grade 1 diastolic dysfunction, moderate LAE;  d.  LHC 07/03/11: LAD stents patent, RCA 25%, EF 55-65% ;  e. Adeno. Myoview 5/14:  No ischemia, EF 68%   COVID-19 2021   Depression    Diverticular disease    GERD (gastroesophageal reflux disease)    Heart murmur    Hemorrhoid    Hepatitis    yellow jaundice as a child    HTN (hypertension)    Hx of echocardiogram    a.  Echo (05/2011):  Mild LVH, EF 55-60%, Gr 1 DD, mod LAE.b. Echo (11/14):  Mild LVH, mild focal basal septal hypertrophy, EF 55-60%, Gr 1 DD, mild LAE, atrial septal aneurysm   Hyperlipidemia    Hypertension    Impaired hearing    bilateral- no hearing aids   Jaundice    Myocardial infarction (HCC)    hx of x 2 3-4 years ago per pt   Obesity    OSA (obstructive sleep apnea)    needs no cpap per patient pt denies   Other psoriasis    ongoing- none at present.   Sleep apnea    Vertigo     Past Surgical History:  Procedure Laterality Date   ABDOMINAL HYSTERECTOMY     BACK SURGERY     Dr. Lawernce Pitts   CARDIAC CATHETERIZATION  08/23/2011   Ablation AV  Node   CATARACT EXTRACTION Bilateral    COLONOSCOPY     CORONARY ANGIOPLASTY     with stents 2009    KNEE SURGERY     right   LEFT  AND RIGHT HEART CATHETERIZATION WITH CORONARY ANGIOGRAM N/A 08/18/2013   Procedure: LEFT AND RIGHT HEART CATHETERIZATION WITH CORONARY ANGIOGRAM;  Surgeon: Larey Dresser, MD;  Location: [facility] [place] CATH LAB;  Service: Cardiovascular;  Laterality: N/A;   LEFT HEART CATHETERIZATION WITH CORONARY ANGIOGRAM N/A 07/03/2011   Procedure: LEFT HEART CATHETERIZATION WITH CORONARY ANGIOGRAM;  Surgeon: Minus Breeding, MD;  Location: [facility] CATH LAB;  Service: Cardiovascular;  Laterality: N/A;   REPAIR RECTOCELE     SUPRAVENTRICULAR TACHYCARDIA ABLATION N/A 08/23/2011   Procedure: SUPRAVENTRICULAR TACHYCARDIA ABLATION;  Surgeon: Evans Lance, MD;  Location: [facility] CATH LAB;  Service: Cardiovascular;  Laterality: N/A;   TOTAL HIP ARTHROPLASTY Right 02/23/2016   Procedure: RIGHT TOTAL HIP ARTHROPLASTY ANTERIOR APPROACH;  Surgeon: Rod Can, MD;  Location: [facility] ORS;  Service: Orthopedics;  Laterality: Right;   TOTAL KNEE ARTHROPLASTY Left 12/22/2012   Procedure: LEFT TOTAL KNEE ARTHROPLASTY;  Surgeon: Gearlean Alf, MD;  Location: [facility] ORS;  Service: Orthopedics;  Laterality: Left;   TUBAL LIGATION     UPPER GASTROINTESTINAL ENDOSCOPY  Prior to Admission medications   Medication Sig Start Date End Date Taking? Authorizing Provider  alendronate (FOSAMAX) 70 MG tablet Take 1 tablet (70 mg total) by mouth every 7 (seven) days. Take with a full glass of water on an empty stomach. 04/19/20  Yes Ronnie Doss M, DO  aspirin EC 81 MG tablet Take 81 mg by mouth daily.   Yes [provider]  atorvastatin (LIPITOR) 80 MG tablet Take 1 tablet (80 mg total) by mouth every other day. 08/23/20  Yes Gottschalk, Leatrice Jewels M, DO  betamethasone valerate ointment (VALISONE) 0.1 % Apply 1 application topically 2 (two) times daily as needed (psoriasis). (DO NOT apply to face, groin or axilla) 02/24/21  Yes Ronnie Doss M, DO  Calcium Carbonate-Vitamin D (SUPER CALCIUM 600 + D3 PO) Take 1 tablet by mouth daily.   Yes [provider]  diltiazem (CARDIZEM CD) 120 MG 24 hr capsule Take 1 capsule (120 mg total) by mouth daily. 08/23/20  Yes Gottschalk, Ashly M, DO  fluticasone (FLONASE) 50 MCG/ACT nasal spray SPRAY 1 SPRAY INTO EACH NOSTRIL TWICE A DAY 02/22/20  Yes Gottschalk, Ashly M, DO  furosemide (LASIX) 20 MG tablet TAKE 1 TABLET BY MOUTH EVERY DAY 10/20/20  Yes Gottschalk, Ashly M, DO  losartan (COZAAR) 25 MG tablet TAKE 1 TABLET BY MOUTH TWICE A DAY 12/22/20  Yes Gottschalk, Ashly M, DO  omeprazole (PRILOSEC) 20 MG capsule TAKE 1 CAPSULE BY MOUTH EVERY DAY 03/08/21  Yes Gottschalk, Ashly M, DO  potassium chloride (KLOR-CON 10) 10 MEQ tablet Take 1 tablet (10 mEq total) by mouth daily. 08/23/20  Yes Ronnie Doss M, DO  spironolactone (ALDACTONE) 25 MG tablet Take 0.5 tablets (12.5 mg total) by mouth 2 (two) times daily. 08/23/20  Yes Gottschalk, Ashly M, DO  albuterol (VENTOLIN HFA) 108 (90 Base) MCG/ACT inhaler TAKE 2 PUFFS BY MOUTH EVERY 6 HOURS AS NEEDED FOR WHEEZE OR SHORTNESS OF BREATH Patient taking differently: Inhale 2 puffs into the lungs every 6 (six) hours as needed for wheezing or shortness of breath. 12/05/20   Janora Norlander, DO  ALPRAZolam Duanne Moron) 0.5 MG tablet TAKE 1 TABLET BY MOUTH TWICE A DAY AS NEEDED FOR ANXIETY 06/20/18   Hawks, Alyse Low A, FNP  budesonide-formoterol (SYMBICORT) 80-4.5 MCG/ACT inhaler Inhale 2 puffs into the lungs 2 (two) times daily. 08/23/20   Janora Norlander, DO  cyclobenzaprine (FLEXERIL) 5 MG tablet TAKE 1 TABLET BY MOUTH THREE TIMES A DAY AS NEEDED FOR MUSCLE SPASMS 06/20/18   Evelina Dun A, FNP  meclizine (ANTIVERT) 25 MG tablet Take 1 tablet (25 mg total) by mouth 2 (two) times daily as needed for dizziness. Reported on 12/05/2015 06/20/18   Sharion Balloon, FNP  nitroGLYCERIN (NITROSTAT) 0.4 MG SL tablet Place 1 tablet (0.4 mg total) under the tongue every 5 (five) minutes as needed for chest pain (x 3 doses). Reported on 12/05/2015 08/23/20   Janora Norlander, DO     Current Outpatient Medications  Medication Sig Dispense Refill   alendronate (FOSAMAX) 70 MG tablet Take 1 tablet (70 mg total) by mouth every 7 (seven) days. Take with a full glass of water on an empty stomach. 4 tablet 11   aspirin EC 81 MG tablet Take 81 mg by mouth daily.     atorvastatin (LIPITOR) 80 MG tablet Take 1 tablet (80 mg total) by mouth every other day. 45 tablet 3   betamethasone valerate ointment (VALISONE) 0.1 % Apply 1 application topically 2 (two)  times daily as needed (psoriasis). (DO NOT apply to face, groin or axilla) 45 g 2   Calcium Carbonate-Vitamin D (SUPER CALCIUM 600 + D3 PO) Take 1 tablet by mouth daily.     diltiazem (CARDIZEM CD) 120 MG 24 hr capsule Take 1 capsule (120 mg total) by mouth daily. 90 capsule 3   fluticasone (FLONASE) 50 MCG/ACT nasal spray SPRAY 1 SPRAY INTO EACH NOSTRIL TWICE A DAY 48 g 1   furosemide (LASIX) 20 MG tablet TAKE 1 TABLET BY MOUTH EVERY DAY 30 tablet 4   losartan (COZAAR) 25 MG tablet TAKE 1 TABLET BY MOUTH TWICE A DAY 180 tablet 0   omeprazole (PRILOSEC) 20 MG capsule TAKE 1 CAPSULE BY MOUTH EVERY DAY 90 capsule 1   potassium chloride (KLOR-CON 10) 10 MEQ tablet Take 1 tablet (10 mEq total) by mouth daily. 90 tablet 3   spironolactone (ALDACTONE) 25 MG tablet Take 0.5 tablets (12.5 mg total) by mouth 2 (two) times daily. 90 tablet 3   albuterol (VENTOLIN HFA) 108 (90 Base) MCG/ACT inhaler TAKE 2 PUFFS BY MOUTH EVERY 6 HOURS AS NEEDED FOR WHEEZE OR SHORTNESS OF BREATH (Patient taking differently: Inhale 2 puffs into the lungs every 6 (six) hours as needed for wheezing or shortness of breath.) 18 each 1   ALPRAZolam (XANAX) 0.5 MG tablet TAKE 1 TABLET BY MOUTH TWICE A DAY AS NEEDED FOR ANXIETY 60 tablet 0   budesonide-formoterol (SYMBICORT) 80-4.5 MCG/ACT inhaler Inhale 2 puffs into the lungs 2 (two) times daily. 1 each 3   cyclobenzaprine (FLEXERIL) 5 MG tablet TAKE 1 TABLET BY MOUTH THREE TIMES A DAY AS NEEDED FOR MUSCLE SPASMS 30  tablet 0   meclizine (ANTIVERT) 25 MG tablet Take 1 tablet (25 mg total) by mouth 2 (two) times daily as needed for dizziness. Reported on 12/05/2015 30 tablet 1   nitroGLYCERIN (NITROSTAT) 0.4 MG SL tablet Place 1 tablet (0.4 mg total) under the tongue every 5 (five) minutes as needed for chest pain (x 3 doses). Reported on 12/05/2015 25 tablet 1   Current Facility-Administered Medications  Medication Dose Route Frequency Provider Last Rate Last Admin   0.9 %  sodium chloride infusion  500 mL Intravenous Once Gatha Mayer, MD        Allergies as of 03/10/2021 - Review Complete 03/10/2021  Allergen Reaction Noted   Nifedipine Other (See Comments)    Tape  07/26/2011   Codeine Itching    Crestor [rosuvastatin] Other (See Comments) 03/19/2014    Family History  Problem Relation Age of Onset   Stroke Mother    Hypertension Mother    Heart failure Mother    Breast cancer Mother    Aneurysm Father    Seizures Brother    Muscular dystrophy Daughter        dx as infant, passed age 29   Seizures Son    Colon cancer Neg Hx    Esophageal cancer Neg Hx    Rectal cancer Neg Hx    Stomach cancer Neg Hx     Social History   Socioeconomic History   Marital status: Married    Spouse name: Not on file   Number of children: 3   Years of education: Not on file   Highest education level: Not on file  Occupational History   Occupation: hairdresser  Tobacco Use   Smoking status: Never   Smokeless tobacco: Never  Vaping Use   Vaping Use: Never used  Substance and Sexual  Activity   Alcohol use: No   Drug use: No   Sexual activity: Not Currently    Birth control/protection: Post-menopausal  Other Topics Concern   Not on file  Social History Narrative   Not on file   Social Determinants of Health   Financial Resource Strain: Not on file  Food Insecurity: Not on file  Transportation Needs: Not on file  Physical Activity: Not on file  Stress: Not on file  Social Connections:  Not on file  Intimate Partner Violence: Not on file    Review of Systems:  All other review of systems negative except as mentioned in the HPI.  Physical Exam: Vital signs BP 134/70   Pulse 89   Temp 98.4 F (36.9 C)   Ht '5\' 4"'$  (1.626 m)   Wt 253 lb (114.8 kg)   SpO2 97%   BMI 43.43 kg/m   General:   Alert,  Well-developed, well-nourished, pleasant and cooperative in NAD Lungs:  Clear throughout to auscultation.   Heart:  Regular rate and rhythm; no murmurs, clicks, rubs,  or gallops. Abdomen:  Soft, nontender and nondistended. Normal bowel sounds.   Neuro/Psych:  Alert and cooperative. Normal mood and affect. A and O x 3   '@Chella Chapdelaine'$  Simonne Maffucci, MD, [facility] [phone] (pager) 03/10/2021 11:49 AM@

## 2021-03-10 NOTE — Progress Notes (Signed)
VS completed by CW.   Pt's states no medical or surgical changes since previsit or office visit.  

## 2021-03-10 NOTE — Op Note (Signed)
[facility] Patient Name: Corianna Hiestand Procedure Date: 03/10/2021 11:46 AM MRN: [id number] Endoscopist: Gatha Mayer , MD Age: 75 Referring MD:  Date of Birth: 10/02/1945 Gender: Female Account #: [id number] Procedure:                Colonoscopy Indications:              Screening for colorectal malignant neoplasm Medicines:                Propofol per Anesthesia, Monitored Anesthesia Care Procedure:                Pre-Anesthesia Assessment:                           - Prior to the procedure, a History and Physical                            was performed, and patient medications and                            allergies were reviewed. The patient's tolerance of                            previous anesthesia was also reviewed. The risks                            and benefits of the procedure and the sedation                            options and risks were discussed with the patient.                            All questions were answered, and informed consent                            was obtained. Prior Anticoagulants: The patient has                            taken no previous anticoagulant or antiplatelet                            agents. ASA Grade Assessment: III - A patient with                            severe systemic disease. After reviewing the risks                            and benefits, the patient was deemed in                            satisfactory condition to undergo the procedure.                           After obtaining informed consent, the colonoscope  was passed under direct vision. Throughout the                            procedure, the patient's blood pressure, pulse, and                            oxygen saturations were monitored continuously. The                            Olympus [id number]) Colonoscope was                            introduced through the anus and advanced to the the                             cecum, identified by appendiceal orifice and                            ileocecal valve. The colonoscopy was performed                            without difficulty. The patient tolerated the                            procedure well. The quality of the bowel                            preparation was good. The ileocecal valve,                            appendiceal orifice, and rectum were photographed. Scope In: 11:59:30 AM Scope Out: 12:10:02 PM Scope Withdrawal Time: 0 hours 7 minutes 55 seconds  Total Procedure Duration: 0 hours 10 minutes 32 seconds  Findings:                 The perianal and digital rectal examinations were                            normal.                           Many small and large-mouthed diverticula were found                            in the sigmoid colon and descending colon. There                            was narrowing of the colon in association with the                            diverticular opening.                           External and internal hemorrhoids were found.  The exam was otherwise without abnormality on                            direct and retroflexion views. Complications:            No immediate complications. Estimated Blood Loss:     Estimated blood loss: none. Impression:               - Severe diverticulosis in the sigmoid colon and in                            the descending colon. There was narrowing of the                            colon in association with the diverticular opening.                           - External and internal hemorrhoids.                           - The examination was otherwise normal on direct                            and retroflexion views.                           - No specimens collected. Recommendation:           - Patient has a contact number available for                            emergencies. The signs and symptoms of potential                             delayed complications were discussed with the                            patient. Return to normal activities tomorrow.                            Written discharge instructions were provided to the                            patient.                           - Resume previous diet.                           - Continue present medications.                           - No repeat colonoscopy due to current age (77                            years or older) and the absence of colonic polyps. Gatha Mayer, MD 03/10/2021  12:14:01 PM This report has been signed electronically.

## 2021-03-14 ENCOUNTER — Telehealth: Payer: Self-pay

## 2021-03-14 NOTE — Telephone Encounter (Signed)
  Follow up Call-  Call back number 03/10/2021  Post procedure Call Back phone  # [phone]  Permission to leave phone message Yes  Some recent data might be hidden     Patient questions:  Do you have a fever, pain , or abdominal swelling? No. Pain Score  0 *  Have you tolerated food without any problems? Yes.    Have you been able to return to your normal activities? Yes.    Do you have any questions about your discharge instructions: Diet   No. Medications  No. Follow up visit  No.  Do you have questions or concerns about your Care? No.  Actions: * If pain score is 4 or above: No action needed, pain <4.

## 2021-03-31 ENCOUNTER — Other Ambulatory Visit: Payer: Self-pay | Admitting: Family Medicine

## 2021-04-04 ENCOUNTER — Ambulatory Visit: Payer: Self-pay | Admitting: Pharmacist

## 2021-04-11 ENCOUNTER — Other Ambulatory Visit: Payer: Self-pay

## 2021-04-11 ENCOUNTER — Ambulatory Visit (INDEPENDENT_AMBULATORY_CARE_PROVIDER_SITE_OTHER): Payer: Medicare HMO | Admitting: Pharmacist

## 2021-04-11 DIAGNOSIS — E785 Hyperlipidemia, unspecified: Secondary | ICD-10-CM | POA: Diagnosis not present

## 2021-04-11 DIAGNOSIS — E1169 Type 2 diabetes mellitus with other specified complication: Secondary | ICD-10-CM

## 2021-04-11 NOTE — Progress Notes (Signed)
04/11/2021 Name: Sara [name] MRN: [id number] DOB: [birth year]   HPI:  Sara [name] is a 75 y.o. female patient referred to lipid clinic by PCP.  PMH is significant for Past Medical History:  Diagnosis Date   AVNRT (AV nodal re-entry tachycardia) ([place])    s/p RFCA 09/2011   Bronchitis    none recent   CAD (coronary artery disease)    a. s/p Xience DES x 3 to LAD;  b. nuc study 02/27/10: EF 67% no ischemia;  c.  echo 11/12: Mild LVH, EF 59-16%, grade 1 diastolic dysfunction, moderate LAE;  d.  LHC 07/03/11: LAD stents patent, RCA 25%, EF 55-65% ;  e. Adeno. Myoview 5/14:  No ischemia, EF 68%   Heart murmur    Hemorrhoid    Hepatitis    yellow jaundice as a child    HTN (hypertension)    Hx of echocardiogram    a.  Echo (05/2011):  Mild LVH, EF 55-60%, Gr 1 DD, mod LAE.b. Echo (11/14):  Mild LVH, mild focal basal septal hypertrophy, EF 55-60%, Gr 1 DD, mild LAE, atrial septal aneurysm   Hyperlipidemia    Hypertension    Impaired hearing    bilateral- no hearing aids   Jaundice    Myocardial infarction (HCC)    hx of x 2 3-4 years ago per pt   Obesity    OSA (obstructive sleep apnea)    needs no cpap per patient pt denies   Sleep apnea    Vertigo    Current Medications:  Intolerances: rosuvastatin (tolerating atorvastatin 80mg  every other day okay)  Risk Factors: T2DM, family history, MI, HTN, significant cardiac history as above  LDL goal: <70   Diet: mediterranean diet recommended, low fat, increase fiber  Exercise: should incorporate as able  Family History: mom/stroke   Social History: non smoker, no alcohol   Labs:   Current Outpatient Medications on File Prior to Visit  Medication Sig Dispense Refill   albuterol (VENTOLIN HFA) 108 (90 Base) MCG/ACT inhaler TAKE 2 PUFFS BY MOUTH EVERY 6 HOURS AS NEEDED FOR WHEEZE OR SHORTNESS OF BREATH (Patient taking differently: Inhale 2 puffs into the lungs every 6 (six) hours as needed for wheezing or  shortness of breath.) 18 each 1   alendronate (FOSAMAX) 70 MG tablet TAKE 1 TABLET EVERY 7 DAYS WITH A FULL GLASS OF WTER ON AN EMPTY STOMACH 4 tablet 11   ALPRAZolam (XANAX) 0.5 MG tablet TAKE 1 TABLET BY MOUTH TWICE A DAY AS NEEDED FOR ANXIETY 60 tablet 0   aspirin EC 81 MG tablet Take 81 mg by mouth daily.     atorvastatin (LIPITOR) 80 MG tablet Take 1 tablet (80 mg total) by mouth every other day. 45 tablet 3   betamethasone valerate ointment (VALISONE) 0.1 % Apply 1 application topically 2 (two) times daily as needed (psoriasis). (DO NOT apply to face, groin or axilla) 45 g 2   budesonide-formoterol (SYMBICORT) 80-4.5 MCG/ACT inhaler Inhale 2 puffs into the lungs 2 (two) times daily. 1 each 3   Calcium Carbonate-Vitamin D (SUPER CALCIUM 600 + D3 PO) Take 1 tablet by mouth daily.     cyclobenzaprine (FLEXERIL) 5 MG tablet TAKE 1 TABLET BY MOUTH THREE TIMES A DAY AS NEEDED FOR MUSCLE SPASMS 30 tablet 0   diltiazem (CARDIZEM CD) 120 MG 24 hr capsule Take 1 capsule (120 mg total) by mouth daily. 90 capsule 3   fluticasone (FLONASE) 50 MCG/ACT nasal spray SPRAY  1 SPRAY INTO EACH NOSTRIL TWICE A DAY 48 g 1   furosemide (LASIX) 20 MG tablet TAKE 1 TABLET BY MOUTH EVERY DAY 30 tablet 4   losartan (COZAAR) 25 MG tablet TAKE 1 TABLET BY MOUTH TWICE A DAY 180 tablet 0   meclizine (ANTIVERT) 25 MG tablet Take 1 tablet (25 mg total) by mouth 2 (two) times daily as needed for dizziness. Reported on 12/05/2015 30 tablet 1   nitroGLYCERIN (NITROSTAT) 0.4 MG SL tablet Place 1 tablet (0.4 mg total) under the tongue every 5 (five) minutes as needed for chest pain (x 3 doses). Reported on 12/05/2015 25 tablet 1   omeprazole (PRILOSEC) 20 MG capsule TAKE 1 CAPSULE BY MOUTH EVERY DAY 90 capsule 1   potassium chloride (KLOR-CON 10) 10 MEQ tablet Take 1 tablet (10 mEq total) by mouth daily. 90 tablet 3   spironolactone (ALDACTONE) 25 MG tablet Take 0.5 tablets (12.5 mg total) by mouth 2 (two) times daily. 90 tablet 3    No current facility-administered medications on file prior to visit.   Allergies  Allergen Reactions   Nifedipine Other (See Comments)    Brings blood pressure up really fast PROCARDIA   Tape     bleeding   Codeine Itching   Crestor [Rosuvastatin] Other (See Comments)    myalgias     Lipid Panel     Component Value Date/Time   CHOL 179 02/24/2021 0901   TRIG 75 02/24/2021 0901   HDL 51 02/24/2021 0901   CHOLHDL 3.5 02/24/2021 0901   CHOLHDL 3.0 07/11/2016 1507   VLDL 13 07/11/2016 1507   LDLCALC 114 (H) 02/24/2021 0901   LABVLDL 14 02/24/2021 0901    Assessment/Plan:  1. Hyperlipidemia - LDL <70 T2DM Atorvastatin 80mg  every other day--tolerating okay, does not wish to increase dose due to side effects  Will explore Repatha coverage (may be costly, [employer] grant closed for now)  Consider Zetia if unable to get repatha  Regina Eck, PharmD, BCPS Clinical Pharmacist, [facility] Phone [phone]

## 2021-04-18 ENCOUNTER — Other Ambulatory Visit: Payer: Self-pay | Admitting: Family Medicine

## 2021-04-18 ENCOUNTER — Encounter: Payer: Self-pay | Admitting: Pharmacist

## 2021-04-18 MED ORDER — REPATHA SURECLICK 140 MG/ML ~~LOC~~ SOAJ: 140.0000 mg | SUBCUTANEOUS | 6 refills | Status: DC

## 2021-04-19 NOTE — Telephone Encounter (Signed)
Will cc to [facility] for assistance

## 2021-04-25 ENCOUNTER — Telehealth: Payer: Self-pay

## 2021-04-25 NOTE — Chronic Care Management (AMB) (Signed)
  Care Management   Note  04/25/2021 Name: Sara [name] MRN: [id number] DOB: Nov 19, 1945  Sara [name] is a 75 y.o. year old female who is a primary care patient of Janora Norlander, DO and is actively engaged with the care management team. I reached out to [employer] by phone today to assist with re-scheduling an initial visit with the Pharmacist  Follow up plan: Unsuccessful telephone outreach attempt made. A HIPAA compliant phone message was left for the patient providing contact information and requesting a return call.  The care management team will reach out to the patient again over the next 2 days.  If patient returns call to provider office, please advise to call [facility]  at [place], [place], [place], [place] [address] Direct Dial: [phone] [email or web address]

## 2021-04-26 ENCOUNTER — Other Ambulatory Visit: Payer: Self-pay

## 2021-04-26 ENCOUNTER — Ambulatory Visit (INDEPENDENT_AMBULATORY_CARE_PROVIDER_SITE_OTHER): Payer: Medicare HMO

## 2021-04-26 ENCOUNTER — Ambulatory Visit (INDEPENDENT_AMBULATORY_CARE_PROVIDER_SITE_OTHER): Payer: Medicare HMO | Admitting: Pharmacist

## 2021-04-26 ENCOUNTER — Telehealth: Payer: Self-pay | Admitting: Pharmacist

## 2021-04-26 DIAGNOSIS — E785 Hyperlipidemia, unspecified: Secondary | ICD-10-CM

## 2021-04-26 DIAGNOSIS — E1169 Type 2 diabetes mellitus with other specified complication: Secondary | ICD-10-CM

## 2021-04-26 DIAGNOSIS — Z23 Encounter for immunization: Secondary | ICD-10-CM

## 2021-04-26 NOTE — Telephone Encounter (Signed)
Can you please call pharmacy and then let patient know cost--usually an expensive one

## 2021-04-26 NOTE — Progress Notes (Signed)
Chronic Care Management Pharmacy Note  04/26/2021 Name:  Sara [name] MRN:  [id number] DOB:  04/18/1946  Summary: Sara [name]  Recommendations/Changes made from today's visit: Hyperlipidemia:  Goal on track: NO. Uncontrolled; current treatment:ATORVASTATIN 80MG EVERY OTHER DAY Intolerances --> rosuvastatin Repatha not covered on insurace  Called in [place] but over >$100/month, would enter coverage gap Will enroll in [facility] [place] in January if it re-opens Continue Atorvastatin Samples of Nexletol given Consider Zetia if affordable --FOLLOW UP ON 10/25 Lipid Panel     Component Value Date/Time   CHOL 179 02/24/2021 0901   TRIG 75 02/24/2021 0901   HDL 51 02/24/2021 0901   CHOLHDL 3.5 02/24/2021 0901   CHOLHDL 3.0 07/11/2016 1507   VLDL 13 07/11/2016 1507   LDLCALC 114 (H) 02/24/2021 0901   LABVLDL 14 02/24/2021 0901   Recommended praluent (patient can't afford), will keep in nexletol samples until we have grant reopen, can also try zetia next Assessed patient finances. Will apply for health well grant in January 2023  Follow Up Plan: Telephone follow up appointment with care management team member scheduled for: 3 weeks  Subjective: Sara [name] is an 75 y.o. year old female who is a primary patient of Janora Norlander, DO.  The CCM team was consulted for assistance with disease management and care coordination needs.    Engaged with patient face to face for follow up visit in response to provider referral for pharmacy case management and/or care coordination services.   Consent to Services:  The patient was given information about Chronic Care Management services, agreed to services, and gave verbal consent prior to initiation of services.  Please see initial visit note for detailed documentation.   Patient Care Team: Janora Norlander, DO as PCP - General (Family Medicine) Bensimhon, Shaune Pascal, MD (Cardiology) Gaynelle Arabian, MD as  Consulting Physician (Orthopedic Surgery) Lavera Guise, [place] (Pharmacist)  Objective:  Lab Results  Component Value Date   CREATININE 0.63 01/20/2021   CREATININE 0.75 08/23/2020   CREATININE 0.85 12/11/2019    Lab Results  Component Value Date   HGBA1C 6.5 02/24/2021   Last diabetic Eye exam: No results found for: HMDIABEYEEXA  Last diabetic Foot exam: No results found for: HMDIABFOOTEX      Component Value Date/Time   CHOL 179 02/24/2021 0901   TRIG 75 02/24/2021 0901   HDL 51 02/24/2021 0901   CHOLHDL 3.5 02/24/2021 0901   CHOLHDL 3.0 07/11/2016 1507   VLDL 13 07/11/2016 1507   LDLCALC 114 (H) 02/24/2021 0901    Hepatic Function Latest Ref Rng & Units 01/20/2021 08/23/2020 12/11/2019  Total Protein 6.5 - 8.1 g/dL 6.5 6.1 5.9(L)  Albumin 3.5 - 5.0 g/dL 3.5 3.7 3.7  AST 15 - 41 U/L '29 16 21  ' ALT 0 - 44 U/L '20 12 12  ' Alk Phosphatase 38 - 126 U/L 54 72 74  Total Bilirubin 0.3 - 1.2 mg/dL 0.9 1.1 0.9  Bilirubin, Direct 0.0 - 0.3 mg/dL - - -    Lab Results  Component Value Date/Time   TSH 1.370 12/11/2019 01:53 PM   TSH 1.910 02/02/2019 10:15 AM    CBC Latest Ref Rng & Units 01/20/2021 02/02/2019 04/08/2018  WBC 4.0 - 10.5 K/uL 7.7 5.6 5.1  Hemoglobin 12.0 - 15.0 g/dL 13.8 14.6 14.1  Hematocrit 36.0 - 46.0 % 40.4 42.1 42.5  Platelets 150 - 400 K/uL 189 159 181    Lab Results  Component Value Date/Time  VD25OH 37.7 02/24/2021 09:01 AM   VD25OH 42.2 04/15/2020 03:31 PM    Clinical ASCVD: Yes  The ASCVD Risk score (Arnett DK, et al., 2019) failed to calculate for the following reasons:   The patient has a prior MI or stroke diagnosis    Other: (CHADS2VASc if Afib, PHQ9 if depression, MMRC or CAT for COPD, ACT, DEXA)  Social History   Tobacco Use  Smoking Status Never  Smokeless Tobacco Never   BP Readings from Last 3 Encounters:  03/10/21 (!) 94/59  03/08/21 120/68  02/24/21 116/79   Pulse Readings from Last 3 Encounters:  03/10/21 68  03/08/21 75   02/24/21 79   Wt Readings from Last 3 Encounters:  03/10/21 253 lb (114.8 kg)  03/08/21 253 lb (114.8 kg)  02/28/21 253 lb (114.8 kg)    Assessment: Review of patient past medical history, allergies, medications, health status, including review of consultants reports, laboratory and other test data, was performed as part of comprehensive evaluation and provision of chronic care management services.   SDOH:  (Social Determinants of Health) assessments and interventions performed:    CCM Care Plan  Allergies  Allergen Reactions   Nifedipine Other (See Comments)    Brings blood pressure up really fast PROCARDIA   Tape     bleeding   Codeine Itching   Crestor [Rosuvastatin] Other (See Comments)    myalgias    Medications Reviewed Today     Reviewed by Lavera Guise, [facility] (Pharmacist) on 04/26/21 at [place] List Status: <None>   Medication Order Taking? Sig Documenting Provider Last Dose Status Informant  albuterol (VENTOLIN HFA) 108 (90 Base) MCG/ACT inhaler [id number]  TAKE 2 PUFFS BY MOUTH EVERY 6 HOURS AS NEEDED FOR WHEEZE OR SHORTNESS OF BREATH  Patient taking differently: Inhale 2 puffs into the lungs every 6 (six) hours as needed for wheezing or shortness of breath.   Ronnie Doss M, DO  Active   alendronate (FOSAMAX) 70 MG tablet [id number]  TAKE 1 TABLET EVERY 7 DAYS WITH A FULL GLASS OF Virgel Paling ON AN EMPTY STOMACH Ronnie Doss M, DO  Active            Med Note Blanca Friend, Christiona Siddique D   Tue Apr 11, 2021  4:15 PM) Started on 04/19/20  Alirocumab (PRALUENT) 75 MG/ML SOAJ [id number]  Inject 75 mg into the skin every 14 (fourteen) days. Ronnie Doss M, DO  Active   ALPRAZolam Duanne Moron) 0.5 MG tablet [id number]  TAKE 1 TABLET BY MOUTH TWICE A DAY AS NEEDED FOR ANXIETY Evelina Dun A, FNP  Active   aspirin EC 81 MG tablet [id number]  Take 81 mg by mouth daily. [provider]  Active Self  atorvastatin (LIPITOR) 80 MG tablet [id number]  Take 1 tablet (80 mg total) by  mouth every other day. Ronnie Doss M, DO  Active Self  betamethasone valerate ointment (VALISONE) 0.1 % [id number]  Apply 1 application topically 2 (two) times daily as needed (psoriasis). (DO NOT apply to face, groin or axilla) Janora Norlander, DO  Active   budesonide-formoterol (SYMBICORT) 80-4.5 MCG/ACT inhaler [id number]  Inhale 2 puffs into the lungs 2 (two) times daily. Ronnie Doss M, DO  Active Self  Calcium Carbonate-Vitamin D (SUPER CALCIUM 600 + D3 PO) [id number]  Take 1 tablet by mouth daily. [provider]  Active Self  cyclobenzaprine (FLEXERIL) 5 MG tablet [id number]  TAKE 1 TABLET BY MOUTH THREE TIMES A DAY AS NEEDED FOR MUSCLE SPASMS Hawks,  Christy A, FNP  Active   diltiazem (CARDIZEM CD) 120 MG 24 hr capsule [id number]  Take 1 capsule (120 mg total) by mouth daily. Ronnie Doss M, DO  Active Self  fluticasone [facility]) 50 MCG/ACT nasal spray [id number]  SPRAY 1 SPRAY INTO EACH NOSTRIL TWICE A DAY Ronnie Doss M, DO  Active Self  furosemide (LASIX) 20 MG tablet [id number]  TAKE 1 TABLET BY MOUTH EVERY DAY Ronnie Doss M, DO  Active Self  losartan (COZAAR) 25 MG tablet [id number]  TAKE 1 TABLET BY MOUTH TWICE A DAY Gottschalk, Ashly M, DO  Active Self  meclizine (ANTIVERT) 25 MG tablet [id number]  Take 1 tablet (25 mg total) by mouth 2 (two) times daily as needed for dizziness. Reported on 12/05/2015 Sharion Balloon, FNP  Active Self  nitroGLYCERIN (NITROSTAT) 0.4 MG SL tablet [id number]  Place 1 tablet (0.4 mg total) under the tongue every 5 (five) minutes as needed for chest pain (x 3 doses). Reported on 12/05/2015 Janora Norlander, DO  Active   omeprazole (PRILOSEC) 20 MG capsule [id number]  TAKE 1 CAPSULE BY MOUTH EVERY DAY Ronnie Doss M, DO  Active   potassium chloride (KLOR-CON 10) 10 MEQ tablet [id number]  Take 1 tablet (10 mEq total) by mouth daily. Ronnie Doss M, DO  Active Self  spironolactone (ALDACTONE) 25 MG tablet [id number]  Take  0.5 tablets (12.5 mg total) by mouth 2 (two) times daily. Janora Norlander, DO  Active             Patient Active Problem List   Diagnosis Date Noted   New onset type 2 diabetes mellitus ([place]) 08/23/2020   COVID-19 virus infection 07/08/2019   Hyperlipidemia associated with type 2 diabetes mellitus ([place]) 08/27/2018   Morbid obesity ([place]) 08/27/2018   Chronic left-sided low back pain with left-sided sciatica 05/10/2017   Coronary artery disease involving native coronary artery of native heart without angina pectoris 03/20/2017   LBBB (left bundle branch block) 03/20/2017   Age related osteoporosis 10/17/2016   Acute rhinosinusitis 10/17/2016   Primary osteoarthritis of right hip 02/23/2016   Hypokalemia 01/20/2015   OA (osteoarthritis) of knee 12/22/2012   AVNRT (AV nodal re-entry tachycardia) ([place])    Atrial arrhythmia 07/03/2011   OBSTRUCTIVE SLEEP APNEA 03/15/2010   EDEMA 02/28/2009   Hyperlipidemia 12/09/2008   Hypertension associated with diabetes ([place]) 12/09/2008   Coronary atherosclerosis 12/09/2008   HEMORRHOIDS 12/09/2008   GERD 12/09/2008   PSORIASIS 12/09/2008   Arthritis 12/09/2008   VERTIGO 12/09/2008   CARDIAC MURMUR 12/09/2008    Immunization History  Administered Date(s) Administered   Fluad Quad(high Dose 65+) 05/04/2019, 05/25/2020   Influenza Whole 04/22/2009   Influenza, High Dose Seasonal PF 05/01/2018   Influenza,inj,Quad PF,6+ Mos 04/25/2015   Influenza-Unspecified 05/18/2014, 07/05/2016, 05/13/2017   Moderna Sars-Covid-2 Vaccination 10/12/2019, 11/09/2019, 06/01/2020, 01/04/2021   Pneumococcal Conjugate-13 06/15/2014   Pneumococcal Polysaccharide-23 04/22/2008, 05/04/2019   Tdap 04/22/2013, 01/11/2021   Zoster Recombinat (Shingrix) 02/24/2021, 04/26/2021   Zoster, Live 08/03/2010    Conditions to be addressed/monitored: HLD  Care Plan : PHARMD MEDICATION MANAGEMENT  Updates made by Lavera Guise, [place] since 05/10/2021 12:00 AM      Problem: DIESEASE PROGRESSION PREVENTION      Long-Range Goal: HYPERLIPIDEMIA   This Visit's Progress: Not on track  Priority: High  Note:   Current Barriers:  Unable to achieve control of CHOLESTEROL   Pharmacist Clinical Goal(s):  patient will verbalize ability to afford treatment regimen achieve control  of CHOLESTEROL as evidenced by    through collaboration with PharmD and provider.   Interventions: 1:1 collaboration with Janora Norlander, DO regarding development and update of comprehensive plan of care as evidenced by provider attestation and co-signature Inter-disciplinary care team collaboration (see longitudinal plan of care) Comprehensive medication review performed; medication list updated in electronic medical record  Hyperlipidemia:  Goal on track: NO. Uncontrolled; current treatment:ATORVASTATIN 80MG EVERY OTHER DAY Intolerances --> rosuvastatin Repatha not covered on insurace  Called in [place] but over >$100/month, would enter coverage gap Will enroll in [facility] [place] in January if it re-opens Continue Atorvastatin Samples of Nexletol given Consider Zetia if affordable  Lipid Panel     Component Value Date/Time   CHOL 179 02/24/2021 0901   TRIG 75 02/24/2021 0901   HDL 51 02/24/2021 0901   CHOLHDL 3.5 02/24/2021 0901   CHOLHDL 3.0 07/11/2016 1507   VLDL 13 07/11/2016 1507   LDLCALC 114 (H) 02/24/2021 0901   LABVLDL 14 02/24/2021 0901  Recommended praluent (patient can't afford), will keep in nexletol samples until we have grant reopen, can also try zetia next Assessed patient finances. Will apply for health well grant in January 2023   Patient Goals/Self-Care Activities patient will:  - take medications as prescribed collaborate with provider on medication access solutions  Follow Up Plan: Telephone follow up appointment with care management team member scheduled for: 3 weeks      Medication Assistance: None required.  Patient affirms  current coverage meets needs.  [facility] pharmacy [facility]  [id number] [id number]: [phone]: [phone]  Follow Up:  Patient agrees to Care Plan and Follow-up.  Plan: Telephone follow up appointment with care management team member scheduled for:  05/16/21   JRegina Eck PharmD, BCPS Clinical Pharmacist, [place] Phone [phone]

## 2021-05-01 NOTE — Telephone Encounter (Signed)
Patient and pharmacy aware  

## 2021-05-02 ENCOUNTER — Other Ambulatory Visit: Payer: Self-pay | Admitting: Family Medicine

## 2021-05-02 DIAGNOSIS — Z1231 Encounter for screening mammogram for malignant neoplasm of breast: Secondary | ICD-10-CM

## 2021-05-03 ENCOUNTER — Telehealth: Payer: Self-pay | Admitting: Family Medicine

## 2021-05-03 NOTE — Telephone Encounter (Signed)
We can try Zetia instead and recheck cholesterol in 3 months.  She would continue her Atorvastatin with the Zetia.  Not sure what the cost difference would be with Zetia.

## 2021-05-03 NOTE — Telephone Encounter (Signed)
I like the injection for sure but if not affordable, we need to pursue alternatives.

## 2021-05-03 NOTE — Telephone Encounter (Signed)
Spoke with patient and she states that Almyra Free has already started her on an additional med but not sure what it is. She states that she gave her a sample. She said that the cardiologist recommended the shot for her and she felt like Almyra Free thought it would be a good idea too.

## 2021-05-04 NOTE — Telephone Encounter (Signed)
NA

## 2021-05-10 NOTE — Patient Instructions (Signed)
Visit Information  PATIENT GOALS:  Goals Addressed               This Visit's Progress     Patient Stated     HYPERLIPIDEMIA PHARMD GOAL (pt-stated)        Current Barriers:  Unable to achieve control of CHOLESTEROL   Pharmacist Clinical Goal(s):  patient will verbalize ability to afford treatment regimen achieve control of CHOLESTEROL as evidenced by  through collaboration with PharmD and provider.   Interventions: 1:1 collaboration with Janora Norlander, DO regarding development and update of comprehensive plan of care as evidenced by provider attestation and co-signature Inter-disciplinary care team collaboration (see longitudinal plan of care) Comprehensive medication review performed; medication list updated in electronic medical record  Hyperlipidemia:  Goal on track: NO. Uncontrolled; current treatment:ATORVASTATIN 80MG  EVERY OTHER DAY Intolerances --> rosuvastatin Repatha not covered on insurace  Called in [place] but over >$100/month, would enter coverage gap Will enroll in [facility] [place] in January if it re-opens Continue Atorvastatin Samples of Nexletol given Consider Zetia if affordable  Lipid Panel     Component Value Date/Time   CHOL 179 02/24/2021 0901   TRIG 75 02/24/2021 0901   HDL 51 02/24/2021 0901   CHOLHDL 3.5 02/24/2021 0901   CHOLHDL 3.0 07/11/2016 1507   VLDL 13 07/11/2016 1507   LDLCALC 114 (H) 02/24/2021 0901   LABVLDL 14 02/24/2021 0901  Recommended praluent (patient can't afford), will keep in nexletol samples until we have grant reopen, can also try zetia next Assessed patient finances. Will apply for health well grant in January 2023   Patient Goals/Self-Care Activities patient will:  - take medications as prescribed collaborate with provider on medication access solutions  Follow Up Plan: Telephone follow up appointment with care management team member scheduled for: 3 weeks         The patient verbalized  understanding of instructions, educational materials, and care plan provided today and declined offer to receive copy of patient instructions, educational materials, and care plan.   Telephone follow up appointment with care management team member scheduled for: 05/16/21  Signature Regina Eck, PharmD, BCPS Clinical Pharmacist, [place]  II Phone [phone]

## 2021-05-16 ENCOUNTER — Other Ambulatory Visit: Payer: Self-pay | Admitting: Family Medicine

## 2021-05-16 ENCOUNTER — Telehealth: Payer: Self-pay | Admitting: Family Medicine

## 2021-05-16 NOTE — Telephone Encounter (Signed)
LVM for pt to rtn my call to schedule AWV with [facility].  

## 2021-05-16 NOTE — Telephone Encounter (Signed)
Patient has apt with Almyra Free on 10/27 This encounter will be closed

## 2021-05-18 ENCOUNTER — Telehealth: Payer: Medicare HMO

## 2021-05-19 ENCOUNTER — Ambulatory Visit: Payer: Medicare HMO | Admitting: Pharmacist

## 2021-05-19 DIAGNOSIS — E1169 Type 2 diabetes mellitus with other specified complication: Secondary | ICD-10-CM

## 2021-05-19 DIAGNOSIS — E785 Hyperlipidemia, unspecified: Secondary | ICD-10-CM

## 2021-05-19 DIAGNOSIS — E119 Type 2 diabetes mellitus without complications: Secondary | ICD-10-CM

## 2021-05-22 ENCOUNTER — Telehealth: Payer: Self-pay | Admitting: Family Medicine

## 2021-05-22 DIAGNOSIS — E785 Hyperlipidemia, unspecified: Secondary | ICD-10-CM | POA: Diagnosis not present

## 2021-05-22 DIAGNOSIS — E119 Type 2 diabetes mellitus without complications: Secondary | ICD-10-CM | POA: Diagnosis not present

## 2021-05-22 DIAGNOSIS — E1169 Type 2 diabetes mellitus with other specified complication: Secondary | ICD-10-CM | POA: Diagnosis not present

## 2021-05-23 DIAGNOSIS — M7061 Trochanteric bursitis, right hip: Secondary | ICD-10-CM | POA: Diagnosis not present

## 2021-05-23 DIAGNOSIS — Z96649 Presence of unspecified artificial hip joint: Secondary | ICD-10-CM | POA: Diagnosis not present

## 2021-05-24 NOTE — Telephone Encounter (Signed)
Patient not home Husband said to call back at 2pm

## 2021-05-29 ENCOUNTER — Other Ambulatory Visit: Payer: Self-pay

## 2021-05-29 ENCOUNTER — Ambulatory Visit (INDEPENDENT_AMBULATORY_CARE_PROVIDER_SITE_OTHER): Payer: Medicare HMO | Admitting: Family Medicine

## 2021-05-29 ENCOUNTER — Ambulatory Visit: Payer: Medicare HMO | Attending: Orthopedic Surgery

## 2021-05-29 ENCOUNTER — Encounter: Payer: Self-pay | Admitting: Family Medicine

## 2021-05-29 DIAGNOSIS — M6281 Muscle weakness (generalized): Secondary | ICD-10-CM | POA: Insufficient documentation

## 2021-05-29 DIAGNOSIS — N3090 Cystitis, unspecified without hematuria: Secondary | ICD-10-CM

## 2021-05-29 DIAGNOSIS — M25551 Pain in right hip: Secondary | ICD-10-CM | POA: Insufficient documentation

## 2021-05-29 LAB — URINALYSIS
Bilirubin, UA: NEGATIVE
Glucose, UA: NEGATIVE
Ketones, UA: NEGATIVE
Nitrite, UA: NEGATIVE
Protein,UA: NEGATIVE
Specific Gravity, UA: 1.015 (ref 1.005–1.030)
Urobilinogen, Ur: 0.2 mg/dL (ref 0.2–1.0)
pH, UA: 5.5 (ref 5.0–7.5)

## 2021-05-29 MED ORDER — AMOXICILLIN 500 MG PO CAPS: 500.0000 mg | ORAL_CAPSULE | Freq: Three times a day (TID) | ORAL | 0 refills | Status: DC

## 2021-05-29 NOTE — Progress Notes (Signed)
Subjective:    Patient ID: Sara [name], female    DOB: October 10, 1945, 75 y.o.   MRN: [id number]   HPI: Sara [name] is a 75 y.o. female presenting for pain with urination and frequency. Onset yesterday. Went out to lunch and had to go to the restroom 8 times and only passed a few drops. Denies fever . No flank pain. No nausea, vomiting.    Depression screen [facility] 2/9 02/24/2021 08/23/2020 02/03/2019 01/28/2019 08/07/2018  Decreased Interest 2 0 0 0 0  Down, Depressed, Hopeless 0 0 0 0 0  PHQ - 2 Score 2 0 0 0 0  Altered sleeping 0 0 - - 2  Tired, decreased energy 2 0 - - 3  Change in appetite 2 0 - - 0  Feeling bad or failure about yourself  0 0 - - 0  Trouble concentrating 2 0 - - 0  Moving slowly or fidgety/restless 0 0 - - 0  Suicidal thoughts 0 0 - - 0  PHQ-9 Score 8 0 - - 5  Difficult doing work/chores - - - - Somewhat difficult  Some recent data might be hidden     Relevant past medical, surgical, family and social history reviewed and updated as indicated.  Interim medical history since our last visit reviewed. Allergies and medications reviewed and updated.  ROS:  Review of Systems  Constitutional:  Negative for chills, diaphoresis and fever.  HENT:  Negative for congestion.   Eyes:  Negative for visual disturbance.  Respiratory:  Negative for cough and shortness of breath.   Cardiovascular:  Negative for chest pain and palpitations.  Gastrointestinal:  Negative for constipation, diarrhea and nausea.  Genitourinary:  Positive for dysuria, frequency and urgency. Negative for decreased urine volume, flank pain, hematuria, menstrual problem and pelvic pain.  Musculoskeletal:  Negative for arthralgias and joint swelling.  Skin:  Negative for rash.  Neurological:  Negative for dizziness and numbness.    Social History   Tobacco Use  Smoking Status Never  Smokeless Tobacco Never       Objective:     Wt Readings from Last 3 Encounters:  03/10/21 253 lb  (114.8 kg)  03/08/21 253 lb (114.8 kg)  02/28/21 253 lb (114.8 kg)     Exam deferred. Pt. [place] due to COVID 19. Phone visit performed.   No results found for any visits on 05/29/21.   Assessment & Plan:   1. Cystitis     Meds ordered this encounter  Medications   amoxicillin (AMOXIL) 500 MG capsule    Sig: Take 1 capsule (500 mg total) by mouth 3 (three) times daily.    Dispense:  21 capsule    Refill:  0    Orders Placed This Encounter  Procedures   Urine Culture   Urinalysis      Diagnoses and all orders for this visit:  Cystitis -     Urinalysis -     Urine Culture  Other orders -     amoxicillin (AMOXIL) 500 MG capsule; Take 1 capsule (500 mg total) by mouth 3 (three) times daily.   Virtual Visit via telephone Note  I discussed the limitations, risks, security and privacy concerns of performing an evaluation and management service by telephone and the availability of in person appointments. The patient was identified with two identifiers. Pt.expressed understanding and agreed to proceed. Pt. Is at home. Dr. Livia Snellen is in his office.  Follow Up Instructions:   I discussed  the assessment and treatment plan with the patient. The patient was provided an opportunity to ask questions and all were answered. The patient agreed with the plan and demonstrated an understanding of the instructions.   The patient was advised to call back or seek an in-person evaluation if the symptoms worsen or if the condition fails to improve as anticipated.   Total minutes including chart review and phone contact time: 6   Follow up plan: Return if symptoms worsen or fail to improve.  Claretta Fraise, MD [place]

## 2021-05-29 NOTE — Therapy (Signed)
[facility] [place], [place], [address] Phone: [phone]   Fax:  [phone]  Physical Therapy Evaluation  Patient Details  Name: Sara [name] MRN: [id number] Date of Birth: 05-23-1946 Referring Provider (PT): Swinteck   Encounter Date: 05/29/2021   PT End of Session - 05/29/21 1253     Visit Number 1    Number of Visits 8    Date for PT Re-Evaluation 07/28/21    PT Start Time 1300    PT Stop Time 1345    PT Time Calculation (min) 45 min    Activity Tolerance Patient tolerated treatment well    Behavior During Therapy [facility] for tasks assessed/performed             Past Medical History:  Diagnosis Date   Arthritis    Arthritis -hands -Bil. knee replacemnts   AVNRT (AV nodal re-entry tachycardia) ([place])    s/p RFCA 09/2011   Bronchitis    none recent   CAD (coronary artery disease)    a. s/p Xience DES x 3 to LAD;  b. nuc study 02/27/10: EF 67% no ischemia;  c.  echo 11/12: Mild LVH, EF 65-03%, grade 1 diastolic dysfunction, moderate LAE;  d.  LHC 07/03/11: LAD stents patent, RCA 25%, EF 55-65% ;  e. Adeno. Myoview 5/14:  No ischemia, EF 68%   COVID-19 2021   Depression    Diverticular disease    GERD (gastroesophageal reflux disease)    Heart murmur    Hemorrhoid    Hepatitis    yellow jaundice as a child    HTN (hypertension)    Hx of echocardiogram    a.  Echo (05/2011):  Mild LVH, EF 55-60%, Gr 1 DD, mod LAE.b. Echo (11/14):  Mild LVH, mild focal basal septal hypertrophy, EF 55-60%, Gr 1 DD, mild LAE, atrial septal aneurysm   Hyperlipidemia    Hypertension    Impaired hearing    bilateral- no hearing aids   Jaundice    Myocardial infarction (HCC)    hx of x 2 3-4 years ago per pt   Obesity    OSA (obstructive sleep apnea)    needs no cpap per patient pt denies   Other psoriasis    ongoing- none at present.   Sleep apnea    Vertigo     Past Surgical History:  Procedure Laterality Date    ABDOMINAL HYSTERECTOMY     BACK SURGERY     Dr. Lawernce Pitts   CARDIAC CATHETERIZATION  08/23/2011   Ablation AV  Node   CATARACT EXTRACTION Bilateral    COLONOSCOPY     CORONARY ANGIOPLASTY     with stents 2009    KNEE SURGERY     right   LEFT AND RIGHT HEART CATHETERIZATION WITH CORONARY ANGIOGRAM N/A 08/18/2013   Procedure: LEFT AND RIGHT HEART CATHETERIZATION WITH CORONARY ANGIOGRAM;  Surgeon: Larey Dresser, MD;  Location: [facility] C[facility] LAB;  Service: Cardiovascular;  Laterality: N/A;   LEFT HEART CATHETERIZATION WITH CORONARY ANGIOGRAM N/A 07/03/2011   Procedure: LEFT HEART CATHETERIZATION WITH CORONARY ANGIOGRAM;  Surgeon: Minus Breeding, MD;  Location: [facility] CATH LAB;  Service: Cardiovascular;  Laterality: N/A;   REPAIR RECTOCELE     SUPRAVENTRICULAR TACHYCARDIA ABLATION N/A 08/23/2011   Procedure: SUPRAVENTRICULAR TACHYCARDIA ABLATION;  Surgeon: Evans Lance, MD;  Location: [facility] CATH LAB;  Service: Cardiovascular;  Laterality: N/A;   TOTAL HIP ARTHROPLASTY Right 02/23/2016   Procedure: RIGHT TOTAL HIP ARTHROPLASTY ANTERIOR APPROACH;  Surgeon: Rod Can, MD;  Location: [facility] ORS;  Service: Orthopedics;  Laterality: Right;   TOTAL KNEE ARTHROPLASTY Left 12/22/2012   Procedure: LEFT TOTAL KNEE ARTHROPLASTY;  Surgeon: Gearlean Alf, MD;  Location: [facility] ORS;  Service: Orthopedics;  Laterality: Left;   TUBAL LIGATION     UPPER GASTROINTESTINAL ENDOSCOPY      There were no vitals filed for this visit.    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 She had an injection in her right hip last week and it has gotten a lot better. She notes that she has not had any pain in her hip since last week.    Pertinent History HTN, diabetes, OA, chronic low back pain    How long can you sit comfortably? unlimited    How long can you stand comfortably? 15 minutes    How long can you walk  comfortably? 15 minutes (due to low back)    Patient Stated Goals improved strength, walk more    Currently in Pain? No/denies                [facility] PT Assessment - 05/29/21 0001       Assessment   Medical Diagnosis Trochanteric bursitis of right hip    Referring Provider (PT) Swinteck    Onset Date/Surgical Date --   Mid September   Next MD Visit None scheduled    Prior Therapy No      Precautions   Precautions None      Restrictions   Weight Bearing Restrictions No      Balance Screen   Has the patient fallen in the past 6 months No    Has the patient had a decrease in activity level because of a fear of falling?  No    Is the patient reluctant to leave their home because of a fear of falling?  No      Home Environment   Living Environment Private residence    Type of [place] Access Stairs to enter    Entrance Stairs-Number of Steps 1-3    Home Layout Multi-level    Alternate Level Stairs-Number of Steps 6-12    Alternate Level Stairs-Rails Right      Prior Function   Level of Independence Independent    Vocation Retired    Leisure Cabin crew, sing      Cognition   Overall Cognitive Status Within Functional Limits for tasks assessed    Attention Focused    Focused Attention Appears intact    Memory Appears intact    Awareness Appears intact    Problem Solving Appears intact      Observation/Other Assessments   Focus on Therapeutic Outcomes (FOTO)  46.6      Sensation   Additional Comments Patient reports no numbness or tingling      ROM / Strength   AROM / PROM / Strength Strength      Strength   Strength Assessment Site Hip;Knee    Right/Left Hip Right;Left    Right Hip Flexion 4-/5    Right Hip ABduction 4/5   familiar right hip pain   Left Hip Flexion 4-/5    Right/Left Knee Right;Left    Right Knee Flexion 4/5    Right Knee Extension 4+/5    Left Knee Flexion 4/5    Left  Knee Extension 4+/5      Palpation   Palpation  comment TTP: Right greater trochanter, TFL, and IT band      Transfers   Five time sit to stand comments  18 seconds   Fatigue     Balance   Balance Assessed Yes      Static Standing Balance   Static Standing - Balance Support No upper extremity supported    Static Standing - Level of Assistance 5: Stand by assistance    Static Standing Balance -  Activities  Romberg - Eyes Opened   Rhomberg: 30 seconds (mild sway); Tandem: 3 seconds with RLE leading and 20 seconds with LLE leading                       Objective measurements completed on examination: See above findings.       [facility] Adult PT Treatment/Exercise - 05/29/21 0001       Exercises   Exercises Knee/Hip      Knee/Hip Exercises: Sidelying   Clams Right; 20 reps      Manual Therapy   Manual Therapy Soft tissue mobilization    Soft tissue mobilization Right IT band                          PT Long Term Goals - 05/29/21 1409       PT LONG TERM GOAL #1   Title Patient will be independent with her HEP.    Time 4    Period Weeks    Status New    Target Date 06/26/21      PT LONG TERM GOAL #2   Title Patient will be able to stand and walk for at least 25 minutes without being limited by her right hip.    Time 4    Period Weeks    Status New      PT LONG TERM GOAL #3   Title Patient will be able demonstrate a 5x sit to stand in 12 seconds or less.    Baseline 18 seconds    Time 4    Period Weeks    Status New                    Plan - 05/29/21 1253     Clinical Impression Statement Patient is a 75 year old female presenting to physical therapy following right hip pain from prolonged sitting that began approximately 7 weeks ago. She presented with low irritability following an injection she recieved last week. Recommend that she continue with her recommended plan of care to return to her prior level of function.    Personal Factors and Comorbidities Comorbidity  1;Comorbidity 2;Comorbidity 3+;Other;Fitness    Comorbidities HTN, diabetes, OA    Examination-Activity Limitations Transfers;Squat;Lift    Examination-Participation Restrictions Church;Cleaning    Stability/Clinical Decision Making Evolving/Moderate complexity    Clinical Decision Making Moderate    Rehab Potential Good    PT Frequency 2x / week    PT Duration 4 weeks    PT Treatment/Interventions Electrical Stimulation;Iontophoresis 4mg /ml Dexamethasone;Moist Heat;Ultrasound;Neuromuscular re-education;Balance training;Therapeutic exercise;Therapeutic activities;Patient/family education;Manual techniques    PT Next Visit Plan nustep, lower extremity strengthening and balance    PT Home Exercise Plan clamshells (20 reps bilaterally in sidelying)    Consulted and Agree with Plan of Care Patient             Patient will benefit from skilled  therapeutic intervention in order to improve the following deficits and impairments:  Obesity, Decreased activity tolerance, Decreased balance, Increased edema, Decreased strength  Visit Diagnosis: Muscle weakness (generalized)  Pain in right hip     Problem List Patient Active Problem List   Diagnosis Date Noted   New onset type 2 diabetes mellitus ([place]) 08/23/2020   COVID-19 virus infection 07/08/2019   Hyperlipidemia associated with type 2 diabetes mellitus ([place]) 08/27/2018   Morbid obesity ([place]) 08/27/2018   Chronic left-sided low back pain with left-sided sciatica 05/10/2017   Coronary artery disease involving native coronary artery of native heart without angina pectoris 03/20/2017   LBBB (left bundle branch block) 03/20/2017   Age related osteoporosis 10/17/2016   Acute rhinosinusitis 10/17/2016   Primary osteoarthritis of right hip 02/23/2016   Hypokalemia 01/20/2015   OA (osteoarthritis) of knee 12/22/2012   AVNRT (AV nodal re-entry tachycardia) ([place])    Atrial arrhythmia 07/03/2011   OBSTRUCTIVE SLEEP APNEA 03/15/2010    EDEMA 02/28/2009   Hyperlipidemia 12/09/2008   Hypertension associated with diabetes ([place]) 12/09/2008   Coronary atherosclerosis 12/09/2008   HEMORRHOIDS 12/09/2008   GERD 12/09/2008   PSORIASIS 12/09/2008   Arthritis 12/09/2008   VERTIGO 12/09/2008   CARDIAC MURMUR 12/09/2008    Darlin Coco, PT 05/29/2021, 2:09 PM  [facility] [address] [place], [place], [address] Phone: [phone]   Fax:  [phone]  Name: Hiba Garry MRN: [id number] Date of Birth: 23-Jan-1946

## 2021-06-01 ENCOUNTER — Other Ambulatory Visit: Payer: Self-pay

## 2021-06-01 ENCOUNTER — Ambulatory Visit
Admission: RE | Admit: 2021-06-01 | Discharge: 2021-06-01 | Disposition: A | Discharge status: Home / Self Care | Payer: Medicare HMO | Source: Ambulatory Visit | Attending: Family Medicine | Admitting: Family Medicine

## 2021-06-01 DIAGNOSIS — Z1231 Encounter for screening mammogram for malignant neoplasm of breast: Secondary | ICD-10-CM | POA: Diagnosis not present

## 2021-06-03 LAB — URINE CULTURE

## 2021-06-06 ENCOUNTER — Encounter: Payer: Self-pay | Admitting: Physical Therapy

## 2021-06-06 ENCOUNTER — Other Ambulatory Visit: Payer: Self-pay | Admitting: Family Medicine

## 2021-06-06 ENCOUNTER — Other Ambulatory Visit: Payer: Self-pay

## 2021-06-06 ENCOUNTER — Ambulatory Visit: Payer: Medicare HMO | Admitting: Physical Therapy

## 2021-06-06 DIAGNOSIS — M25551 Pain in right hip: Secondary | ICD-10-CM | POA: Diagnosis not present

## 2021-06-06 DIAGNOSIS — M6281 Muscle weakness (generalized): Secondary | ICD-10-CM | POA: Diagnosis not present

## 2021-06-06 MED ORDER — NITROFURANTOIN MONOHYD MACRO 100 MG PO CAPS: 100.0000 mg | ORAL_CAPSULE | Freq: Two times a day (BID) | ORAL | 0 refills | Status: DC

## 2021-06-06 NOTE — Progress Notes (Signed)
Your urine culture shows the presence of a germ that is resistant to the current antibiotic you are taking. Please discontinue that medication and take the new one I have sent to your pharmacy.  Best Regards, Hue Steveson, M.D.  

## 2021-06-06 NOTE — Therapy (Signed)
[facility] [place], [place], [address] Phone: [phone]   Fax:  [phone]  Physical Therapy Treatment  Patient Details  Name: Sara [name] MRN: [id number] Date of Birth: May 25, 1946 Referring Provider (PT): Swinteck   Encounter Date: 06/06/2021   PT End of Session - 06/06/21 1032     Visit Number 2    Number of Visits 8    Date for PT Re-Evaluation 07/28/21    PT Start Time 1032    PT Stop Time 1117    PT Time Calculation (min) 45 min    Activity Tolerance Patient tolerated treatment well    Behavior During Therapy [facility] for tasks assessed/performed             Past Medical History:  Diagnosis Date   Arthritis    Arthritis -hands -Bil. knee replacemnts   AVNRT (AV nodal re-entry tachycardia) ([place])    s/p RFCA 09/2011   Bronchitis    none recent   CAD (coronary artery disease)    a. s/p Xience DES x 3 to LAD;  b. nuc study 02/27/10: EF 67% no ischemia;  c.  echo 11/12: Mild LVH, EF 26-71%, grade 1 diastolic dysfunction, moderate LAE;  d.  LHC 07/03/11: LAD stents patent, RCA 25%, EF 55-65% ;  e. Adeno. Myoview 5/14:  No ischemia, EF 68%   COVID-19 2021   Depression    Diverticular disease    GERD (gastroesophageal reflux disease)    Heart murmur    Hemorrhoid    Hepatitis    yellow jaundice as a child    HTN (hypertension)    Hx of echocardiogram    a.  Echo (05/2011):  Mild LVH, EF 55-60%, Gr 1 DD, mod LAE.b. Echo (11/14):  Mild LVH, mild focal basal septal hypertrophy, EF 55-60%, Gr 1 DD, mild LAE, atrial septal aneurysm   Hyperlipidemia    Hypertension    Impaired hearing    bilateral- no hearing aids   Jaundice    Myocardial infarction (HCC)    hx of x 2 3-4 years ago per pt   Obesity    OSA (obstructive sleep apnea)    needs no cpap per patient pt denies   Other psoriasis    ongoing- none at present.   Sleep apnea    Vertigo     Past Surgical History:  Procedure Laterality Date    ABDOMINAL HYSTERECTOMY     BACK SURGERY     Dr. Lawernce Pitts   CARDIAC CATHETERIZATION  08/23/2011   Ablation AV  Node   CATARACT EXTRACTION Bilateral    COLONOSCOPY     CORONARY ANGIOPLASTY     with stents 2009    KNEE SURGERY     right   LEFT AND RIGHT HEART CATHETERIZATION WITH CORONARY ANGIOGRAM N/A 08/18/2013   Procedure: LEFT AND RIGHT HEART CATHETERIZATION WITH CORONARY ANGIOGRAM;  Surgeon: Larey Dresser, MD;  Location: [facility] CATH LAB;  Service: Cardiovascular;  Laterality: N/A;   LEFT HEART CATHETERIZATION WITH CORONARY ANGIOGRAM N/A 07/03/2011   Procedure: LEFT HEART CATHETERIZATION WITH CORONARY ANGIOGRAM;  Surgeon: Minus Breeding, MD;  Location: [facility] CATH LAB;  Service: Cardiovascular;  Laterality: N/A;   REPAIR RECTOCELE     SUPRAVENTRICULAR TACHYCARDIA ABLATION N/A 08/23/2011   Procedure: SUPRAVENTRICULAR TACHYCARDIA ABLATION;  Surgeon: Evans Lance, MD;  Location: [facility] CATH LAB;  Service: Cardiovascular;  Laterality: N/A;   TOTAL HIP ARTHROPLASTY Right 02/23/2016   Procedure: RIGHT TOTAL HIP ARTHROPLASTY ANTERIOR APPROACH;  Surgeon: Rod Can, MD;  Location: [facility];  Service: Orthopedics;  Laterality: Right;   TOTAL KNEE ARTHROPLASTY Left 12/22/2012   Procedure: LEFT TOTAL KNEE ARTHROPLASTY;  Surgeon: Gearlean Alf, MD;  Location: [facility]S;  Service: Orthopedics;  Laterality: Left;   TUBAL LIGATION     UPPER GASTROINTESTINAL ENDOSCOPY      There were no vitals filed for this visit.   Subjective Assessment - 06/06/21 1029     Subjective Hurts worse this week than last week. Shot may be wearing off.    Pertinent History HTN, diabetes, OA, chronic low back pain    How long can you sit comfortably? unlimited    How long can you stand comfortably? 15 minutes    How long can you walk comfortably? 15 minutes (due to low back)    Patient Stated Goals improved strength, walk more    Currently in Pain? Yes    Pain Score 4     Pain Location Hip    Pain Orientation Right    Pain  Descriptors / Indicators Discomfort;Sore    Pain Type Chronic pain    Pain Onset More than a month ago    Pain Frequency Constant                [facility] PT Assessment - 06/06/21 0001       Assessment   Medical Diagnosis Trochanteric bursitis of right hip    Referring Provider (PT) Swinteck    Next MD Visit None scheduled    Prior Therapy No      Precautions   Precautions None      Restrictions   Weight Bearing Restrictions No                           [facility] Adult PT Treatment/Exercise - 06/06/21 0001       Modalities   Modalities Electrical Stimulation      Electrical Stimulation   Electrical Stimulation Location R greater trochanter    Electrical Stimulation Action Pre-Mod    Electrical Stimulation Parameters 80-150 hz x10 min    Electrical Stimulation Goals Pain      Manual Therapy   Manual Therapy Soft tissue mobilization    Soft tissue mobilization STW to R ITB, glute, HS to reduce muscle tightness and TPs                          PT Long Term Goals - 05/29/21 1409       PT LONG TERM GOAL #1   Title Patient will be independent with her HEP.    Time 4    Period Weeks    Status New    Target Date 06/26/21      PT LONG TERM GOAL #2   Title Patient will be able to stand and walk for at least 25 minutes without being limited by her right hip.    Time 4    Period Weeks    Status New      PT LONG TERM GOAL #3   Title Patient will be able demonstrate a 5x sit to stand in 12 seconds or less.    Baseline 18 seconds    Time 4    Period Weeks    Status New                   Plan - 06/06/21 1110  Clinical Impression Statement Patient presented in clinic with reports of greater R hip pain. Tenderness and TPs notable throughout R ITB, and surrounding the greater trochanter. Patient reports compliance with HEP provided in evaluation. Normal stimulation response noted following removal of the modality.    Personal  Factors and Comorbidities Comorbidity 1;Comorbidity 2;Comorbidity 3+;Other;Fitness    Comorbidities HTN, diabetes, OA    Examination-Activity Limitations Transfers;Squat;Lift    Examination-Participation Restrictions Church;Cleaning    Stability/Clinical Decision Making Evolving/Moderate complexity    Rehab Potential Good    PT Frequency 2x / week    PT Duration 4 weeks    PT Treatment/Interventions Electrical Stimulation;Iontophoresis 4mg /ml Dexamethasone;Moist Heat;Ultrasound;Neuromuscular re-education;Balance training;Therapeutic exercise;Therapeutic activities;Patient/family education;Manual techniques    PT Next Visit Plan nustep, lower extremity strengthening and balance    PT Home Exercise Plan clamshells (20 reps bilaterally in sidelying)    Consulted and Agree with Plan of Care Patient             Patient will benefit from skilled therapeutic intervention in order to improve the following deficits and impairments:  Obesity, Decreased activity tolerance, Decreased balance, Increased edema, Decreased strength  Visit Diagnosis: Muscle weakness (generalized)  Pain in right hip     Problem List Patient Active Problem List   Diagnosis Date Noted   New onset type 2 diabetes mellitus ([place]) 08/23/2020   COVID-19 virus infection 07/08/2019   Hyperlipidemia associated with type 2 diabetes mellitus ([place]) 08/27/2018   Morbid obesity ([place]) 08/27/2018   Chronic left-sided low back pain with left-sided sciatica 05/10/2017   Coronary artery disease involving native coronary artery of native heart without angina pectoris 03/20/2017   LBBB (left bundle branch block) 03/20/2017   Age related osteoporosis 10/17/2016   Acute rhinosinusitis 10/17/2016   Primary osteoarthritis of right hip 02/23/2016   Hypokalemia 01/20/2015   OA (osteoarthritis) of knee 12/22/2012   AVNRT (AV nodal re-entry tachycardia) ([place])    Atrial arrhythmia 07/03/2011   OBSTRUCTIVE SLEEP APNEA 03/15/2010    EDEMA 02/28/2009   Hyperlipidemia 12/09/2008   Hypertension associated with diabetes ([place]) 12/09/2008   Coronary atherosclerosis 12/09/2008   HEMORRHOIDS 12/09/2008   GERD 12/09/2008   PSORIASIS 12/09/2008   Arthritis 12/09/2008   VERTIGO 12/09/2008   CARDIAC MURMUR 12/09/2008    Standley Brooking, PTA 06/06/2021, 11:28 AM  [facility] [address] [place], [place], [address] Phone: [phone]   Fax:  [phone]  Name: Sara [name] MRN: [id number] Date of Birth: 01/18/1946

## 2021-06-07 ENCOUNTER — Ambulatory Visit (INDEPENDENT_AMBULATORY_CARE_PROVIDER_SITE_OTHER): Payer: Medicare HMO | Admitting: Pharmacist

## 2021-06-07 DIAGNOSIS — E785 Hyperlipidemia, unspecified: Secondary | ICD-10-CM

## 2021-06-07 DIAGNOSIS — E1169 Type 2 diabetes mellitus with other specified complication: Secondary | ICD-10-CM

## 2021-06-07 DIAGNOSIS — E119 Type 2 diabetes mellitus without complications: Secondary | ICD-10-CM

## 2021-06-07 NOTE — Progress Notes (Signed)
Chronic Care Management Pharmacy Note  05/19/2021 Name:  Sara [name] MRN:  [id number] DOB:  1946-01-20  Summary: HLD, T2DM  Recommendations/Changes made from today's visit: Hyperlipidemia:  Goal on track: NO. Uncontrolled; current treatment: ATORVASTATIN 80MG EVERY OTHER DAY Intolerances --> rosuvastatin Repatha not covered on insurace  Called in [place] but over >$100/month (COVERED ON INSURANCE UNTIL 06/2022), would enter coverage gap APPLICATION FOR [employer] GRANT SUBMITTED Continue Atorvastatin Samples of Nexletol given Consider Zetia if affordable  TRANSITION TO PRALUENT AT FOLLOW UP ONCE GRANT COPAY CARD RECEIVED-->MEDICATION SHOULD BE $O/COPAY Lipid Panel     Component Value Date/Time   CHOL 179 02/24/2021 0901   TRIG 75 02/24/2021 0901   HDL 51 02/24/2021 0901   CHOLHDL 3.5 02/24/2021 0901   CHOLHDL 3.0 07/11/2016 1507   VLDL 13 07/11/2016 1507   LDLCALC 114 (H) 02/24/2021 0901   LABVLDL 14 02/24/2021 0901   Recommended praluent (patient can't afford), will keep in nexletol samples until we have grant reopen, can also try zetia next Assessed patient finances. APPLICATION FOR [employer] GRANT SUBMITTED --> WILL FOLLOW & GET PATIENT STARTED ON PRALUENT ASAP  - Discussed with patient that praluent can help reduce her cardiac risk significantly while bringing LDL to goal -Control of T2DM/eating healthier diet will be very beneficial as well -->discussed lifestyle changes  Patient Goals/Self-Care Activities patient will:  - take medications as prescribed collaborate with provider on medication access solutions  Follow Up Plan: Telephone follow up appointment with care management team member scheduled for: 1 MONTH  Subjective: Sara [name] is an 75 y.o. year old female who is a primary patient of Janora Norlander, DO.  The CCM team was consulted for assistance with disease management and care coordination needs.     Engaged with patient by telephone for follow up visit in response to provider referral for pharmacy case management and/or care coordination services.   Consent to Services:  The patient was given information about Chronic Care Management services, agreed to services, and gave verbal consent prior to initiation of services.  Please see initial visit note for detailed documentation.   Patient Care Team: Janora Norlander, DO as PCP - General (Family Medicine) Bensimhon, Shaune Pascal, MD (Cardiology) Gaynelle Arabian, MD as Consulting Physician (Orthopedic Surgery) Lavera Guise, [facility] (Pharmacist)   Objective:  Lab Results  Component Value Date   CREATININE 0.63 01/20/2021   CREATININE 0.75 08/23/2020   CREATININE 0.85 12/11/2019    Lab Results  Component Value Date   HGBA1C 6.5 02/24/2021   Last diabetic Eye exam: No results found for: HMDIABEYEEXA  Last diabetic Foot exam: No results found for: HMDIABFOOTEX      Component Value Date/Time   CHOL 179 02/24/2021 0901   TRIG 75 02/24/2021 0901   HDL 51 02/24/2021 0901   CHOLHDL 3.5 02/24/2021 0901   CHOLHDL 3.0 07/11/2016 1507   VLDL 13 07/11/2016 1507   LDLCALC 114 (H) 02/24/2021 0901    Hepatic Function Latest Ref Rng & Units 01/20/2021 08/23/2020 12/11/2019  Total Protein 6.5 - 8.1 g/dL 6.5 6.1 5.9(L)  Albumin 3.5 - 5.0 g/dL 3.5 3.7 3.7  AST 15 - 41 U/L '29 16 21  ' ALT 0 - 44 U/L '20 12 12  ' Alk Phosphatase 38 - 126 U/L 54 72 74  Total Bilirubin 0.3 - 1.2 mg/dL 0.9 1.1 0.9  Bilirubin, Direct 0.0 - 0.3 mg/dL - - -    Lab Results  Component  Value Date/Time   TSH 1.370 12/11/2019 01:53 PM   TSH 1.910 02/02/2019 10:15 AM    CBC Latest Ref Rng & Units 01/20/2021 02/02/2019 04/08/2018  WBC 4.0 - 10.5 K/uL 7.7 5.6 5.1  Hemoglobin 12.0 - 15.0 g/dL 13.8 14.6 14.1  Hematocrit 36.0 - 46.0 % 40.4 42.1 42.5  Platelets 150 - 400 K/uL 189 159 181    Lab Results  Component Value Date/Time   VD25OH 37.7 02/24/2021 09:01 AM   VD25OH  42.2 04/15/2020 03:31 PM    Clinical ASCVD: Yes  The ASCVD Risk score (Arnett DK, et al., 2019) failed to calculate for the following reasons:   The patient has a prior MI or stroke diagnosis    Other: (CHADS2VASc if Afib, PHQ9 if depression, MMRC or CAT for COPD, ACT, DEXA)  Social History   Tobacco Use  Smoking Status Never  Smokeless Tobacco Never   BP Readings from Last 3 Encounters:  03/10/21 (!) 94/59  03/08/21 120/68  02/24/21 116/79   Pulse Readings from Last 3 Encounters:  03/10/21 68  03/08/21 75  02/24/21 79   Wt Readings from Last 3 Encounters:  03/10/21 253 lb (114.8 kg)  03/08/21 253 lb (114.8 kg)  02/28/21 253 lb (114.8 kg)    Assessment: Review of patient past medical history, allergies, medications, health status, including review of consultants reports, laboratory and other test data, was performed as part of comprehensive evaluation and provision of chronic care management services.   SDOH:  (Social Determinants of Health) assessments and interventions performed:    CCM Care Plan  Allergies  Allergen Reactions   Nifedipine Other (See Comments)    Brings blood pressure up really fast PROCARDIA   Tape     bleeding   Codeine Itching   Crestor [Rosuvastatin] Other (See Comments)    myalgias    Medications Reviewed Today     Reviewed by Standley Brooking, PTA (Physical Therapy Assistant) on 06/06/21 at 1029  Med List Status: <None>   Medication Order Taking? Sig Documenting Provider Last Dose Status Informant  albuterol (VENTOLIN HFA) 108 (90 Base) MCG/ACT inhaler [id number] No TAKE 2 PUFFS BY MOUTH EVERY 6 HOURS AS NEEDED FOR WHEEZE OR SHORTNESS OF BREATH  Patient taking differently: Inhale 2 puffs into the lungs every 6 (six) hours as needed for wheezing or shortness of breath.   Ronnie Doss M, DO Unknown Active   alendronate (FOSAMAX) 70 MG tablet [id number]  TAKE 1 TABLET EVERY 7 DAYS WITH A FULL GLASS OF Virgel Paling ON AN EMPTY STOMACH  Ronnie Doss M, DO  Active            Med Note Blanca Friend, Ninah Moccio D   Tue Apr 11, 2021  4:15 PM) Started on 04/19/20  Alirocumab (PRALUENT) 75 MG/ML SOAJ [id number]  Inject 75 mg into the skin every 14 (fourteen) days. Janora Norlander, DO  Active            Med Note Leisa Lenz May 10, 2021  2:02 PM) Too expensive; hasn't started  ALPRAZolam Duanne Moron) 0.5 MG tablet [id number] No TAKE 1 TABLET BY MOUTH TWICE A DAY AS NEEDED FOR ANXIETY Hawks, Christy A, FNP Unknown Active   amoxicillin (AMOXIL) 500 MG capsule [id number]  Take 1 capsule (500 mg total) by mouth 3 (three) times daily. Claretta Fraise, MD  Active   aspirin EC 81 MG tablet [id number] No Take 81 mg by mouth daily. [provider] 03/09/2021 Active Self  atorvastatin (  LIPITOR) 80 MG tablet [id number] No Take 1 tablet (80 mg total) by mouth every other day. Ronnie Doss M, DO 03/10/2021 Active Self  Bempedoic Acid (NEXLETOL) 180 MG TABS [id number]  Take 180 mg by mouth. [provider]  Active            Med Note Blanca Friend, Royce Macadamia   Wed May 10, 2021  2:04 PM) Samples given  betamethasone valerate ointment (VALISONE) 0.1 % [id number] No Apply 1 application topically 2 (two) times daily as needed (psoriasis). (DO NOT apply to face, groin or axilla) Janora Norlander, DO Past Week Active   budesonide-formoterol (SYMBICORT) 80-4.5 MCG/ACT inhaler [id number] No Inhale 2 puffs into the lungs 2 (two) times daily. Ronnie Doss M, DO Unknown Active Self  Calcium Carbonate-Vitamin D (SUPER CALCIUM 600 + D3 PO) [id number] No Take 1 tablet by mouth daily. [provider] 03/10/2021 Active Self  cyclobenzaprine (FLEXERIL) 5 MG tablet [id number] No TAKE 1 TABLET BY MOUTH THREE TIMES A DAY AS NEEDED FOR MUSCLE SPASMS Hawks, Christy A, FNP Unknown Active   diltiazem (CARDIZEM CD) 120 MG 24 hr capsule [id number] No Take 1 capsule (120 mg total) by mouth daily. Ronnie Doss M, DO 03/10/2021 Active Self  fluticasone  (FLONASE) 50 MCG/ACT nasal spray [id number] No SPRAY 1 SPRAY INTO EACH NOSTRIL TWICE A DAY Ronnie Doss M, DO 03/09/2021 Active Self  furosemide (LASIX) 20 MG tablet [id number] No TAKE 1 TABLET BY MOUTH EVERY DAY Ronnie Doss M, DO 03/10/2021 Active Self  losartan (COZAAR) 25 MG tablet [id number]  TAKE 1 TABLET BY MOUTH TWICE A DAY Gottschalk, Ashly M, DO  Active   meclizine (ANTIVERT) 25 MG tablet [id number] No Take 1 tablet (25 mg total) by mouth 2 (two) times daily as needed for dizziness. Reported on 12/05/2015 Sharion Balloon, FNP Unknown Active Self  nitrofurantoin, macrocrystal-monohydrate, (MACROBID) 100 MG capsule [id number]  Take 1 capsule (100 mg total) by mouth 2 (two) times daily. Claretta Fraise, MD  Active   nitroGLYCERIN (NITROSTAT) 0.4 MG SL tablet [id number] No Place 1 tablet (0.4 mg total) under the tongue every 5 (five) minutes as needed for chest pain (x 3 doses). Reported on 12/05/2015 Janora Norlander, DO Unknown Active   omeprazole (PRILOSEC) 20 MG capsule [id number] No TAKE 1 CAPSULE BY MOUTH EVERY DAY Ronnie Doss M, DO 03/10/2021 Active   potassium chloride (KLOR-CON 10) 10 MEQ tablet [id number] No Take 1 tablet (10 mEq total) by mouth daily. Ronnie Doss M, DO 03/10/2021 Active Self  spironolactone (ALDACTONE) 25 MG tablet [id number] No Take 0.5 tablets (12.5 mg total) by mouth 2 (two) times daily. Janora Norlander, DO 03/10/2021 Active             Patient Active Problem List   Diagnosis Date Noted   New onset type 2 diabetes mellitus ([place]) 08/23/2020   COVID-19 virus infection 07/08/2019   Hyperlipidemia associated with type 2 diabetes mellitus ([place]) 08/27/2018   Morbid obesity ([place]) 08/27/2018   Chronic left-sided low back pain with left-sided sciatica 05/10/2017   Coronary artery disease involving native coronary artery of native heart without angina pectoris 03/20/2017   LBBB (left bundle branch block) 03/20/2017   Age related osteoporosis 10/17/2016    Acute rhinosinusitis 10/17/2016   Primary osteoarthritis of right hip 02/23/2016   Hypokalemia 01/20/2015   OA (osteoarthritis) of knee 12/22/2012   AVNRT (AV nodal re-entry tachycardia) ([place])    Atrial arrhythmia 07/03/2011   OBSTRUCTIVE SLEEP APNEA 03/15/2010   EDEMA  02/28/2009   Hyperlipidemia 12/09/2008   Hypertension associated with diabetes ([place]) 12/09/2008   Coronary atherosclerosis 12/09/2008   HEMORRHOIDS 12/09/2008   GERD 12/09/2008   PSORIASIS 12/09/2008   Arthritis 12/09/2008   VERTIGO 12/09/2008   CARDIAC MURMUR 12/09/2008    Immunization History  Administered Date(s) Administered   Fluad Quad(high Dose 65+) 05/04/2019, 05/25/2020   Influenza Whole 04/22/2009   Influenza, High Dose Seasonal PF 05/01/2018   Influenza,inj,Quad PF,6+ Mos 04/25/2015   Influenza-Unspecified 05/18/2014, 07/05/2016, 05/13/2017   Moderna Sars-Covid-2 Vaccination 10/12/2019, 11/09/2019, 06/01/2020   Pneumococcal Conjugate-13 06/15/2014   Pneumococcal Polysaccharide-23 04/22/2008, 05/04/2019   Tdap 04/22/2013, 01/11/2021   Zoster Recombinat (Shingrix) 02/24/2021, 04/26/2021   Zoster, Live 08/03/2010    Conditions to be addressed/monitored: HLD and DMII  Care Plan : PHARMD MEDICATION MANAGEMENT  Updates made by Lavera Guise, [place] since 06/07/2021 12:00 AM     Problem: DIESEASE PROGRESSION PREVENTION      Long-Range Goal: HYPERLIPIDEMIA   Recent Progress: Not on track  Priority: High  Note:   Current Barriers:  Unable to achieve control of CHOLESTEROL & BLOOD SUGAR   Pharmacist Clinical Goal(s):  patient will verbalize ability to afford treatment regimen achieve control of CHOLESTEROL as evidenced by    through collaboration with PharmD and provider.   Interventions: 1:1 collaboration with Janora Norlander, DO regarding development and update of comprehensive plan of care as evidenced by provider attestation and co-signature Inter-disciplinary care team collaboration  (see longitudinal plan of care) Comprehensive medication review performed; medication list updated in electronic medical record  Hyperlipidemia:  Goal on track: NO. Uncontrolled; current treatment: ATORVASTATIN 80MG EVERY OTHER DAY Intolerances --> rosuvastatin Repatha not covered on insurace  Called in [place] but over >$100/month (COVERED ON INSURANCE UNTIL 06/2022), would enter coverage gap APPLICATION FOR [employer] GRANT SUBMITTED Continue Atorvastatin Samples of Nexletol given Consider Zetia if affordable  TRANSITION TO PRALUENT AT FOLLOW UP ONCE GRANT COPAY CARD RECEIVED-->MEDICATION SHOULD BE $O/COPAY Lipid Panel     Component Value Date/Time   CHOL 179 02/24/2021 0901   TRIG 75 02/24/2021 0901   HDL 51 02/24/2021 0901   CHOLHDL 3.5 02/24/2021 0901   CHOLHDL 3.0 07/11/2016 1507   VLDL 13 07/11/2016 1507   LDLCALC 114 (H) 02/24/2021 0901   LABVLDL 14 02/24/2021 0901  Recommended praluent (patient can't afford), will keep in nexletol samples until we have grant reopen, can also try zetia next Assessed patient finances. APPLICATION FOR [employer] GRANT SUBMITTED --> WILL FOLLOW & GET PATIENT STARTED ON PRALUENT ASAP  - Discussed with patient that praluent can help reduce her cardiac risk significantly while bringing LDL to goal -Control of T2DM/eating healthier diet will be very beneficial as well -->discussed lifestyle changes  Patient Goals/Self-Care Activities patient will:  - take medications as prescribed collaborate with provider on medication access solutions  Follow Up Plan: Telephone follow up appointment with care management team member scheduled for: 1 MONTH      Medication Assistance:  [employer] grant submitted today for praluent   Patient's preferred pharmacy is:  [id number] [id number]: [phone]: [phone] Uses pill  box? No - n/.a Pt endorses 100% compliance  Follow Up:  Patient agrees to Care Plan and Follow-up.  Plan: Telephone follow up appointment with care management team member scheduled for:  1 month  JRegina Eck PharmD, BSouthside ChesconessexClinical Pharmacist, WBald Head Island  [place]  [place] Phone [phone]

## 2021-06-07 NOTE — Progress Notes (Signed)
Chronic Care Management Pharmacy Note  06/07/2021 Name:  Sara [name] MRN:  [id number] DOB:  16-Aug-1945  Summary: T2DM, HLD (NEW START PRALUENT; MED LIST UPDATED)  Recommendations/Changes made from today's visit:  Hyperlipidemia:  Goal on track: NO. Uncontrolled; current treatment: ATORVASTATIN 80MG EVERY OTHER DAY Intolerances --> rosuvastatin Repatha not covered on insurace  Called in [name] but over >$100/month (COVERED ON INSURANCE UNTIL 06/2022), HOWEVER patient would enter coverage gap START [place]! $0 copay using grant foundation card for 1 year total PATIENT TO COME IN FOR EDUCATION once she picks up RX INSTRUCTED PATIENT HOW TO USE GRANT/COPAY CARD CONTINUE Atorvastatin TOLERATING ATORVASTATIN 80MG EVERY OTHER DAY STOP NEXLETOL  Lipid Panel     Component Value Date/Time   CHOL 179 02/24/2021 0901   TRIG 75 02/24/2021 0901   HDL 51 02/24/2021 0901   CHOLHDL 3.5 02/24/2021 0901   CHOLHDL 3.0 07/11/2016 1507   VLDL 13 07/11/2016 1507   LDLCALC 114 (H) 02/24/2021 0901   LABVLDL 14 02/24/2021 0901   Recommended praluent (patient can't afford), will keep in nexletol samples until we have grant reopen, can also try zetia next Assessed patient finances. APPLICATION FOR [employer] GRANT APPROVED --> INSTRUCTED PATIENT TO TAKE TO PHARMACY AND THEN BRING TO ME FOR INJECTION TECHNIQUE/ADDITIONAL EDUCATION; GRANT RUNS OUT 06/2022  - Discussed with patient that praluent can help reduce her cardiac risk significantly while bringing LDL to goal -Control of T2DM/eating healthier diet will be very beneficial as well -->discussed lifestyle changes  START METFORMIN FOR DIABETES/BLOOD SUGAR  Patient Goals/Self-Care Activities patient will:  - take medications as prescribed collaborate with provider on medication access solutions  Follow Up Plan: Telephone follow up appointment with care management team  member scheduled for: 1 MONTH   Plan:  Subjective: Sara [name] is an 75 y.o. year old female who is a primary patient of Janora Norlander, DO.  The CCM team was consulted for assistance with disease management and care coordination needs.    Engaged with patient by telephone for follow up visit in response to provider referral for pharmacy case management and/or care coordination services.   Consent to Services:  The patient was given information about Chronic Care Management services, agreed to services, and gave verbal consent prior to initiation of services.  Please see initial visit note for detailed documentation.   Patient Care Team: Janora Norlander, DO as PCP - General (Family Medicine) Bensimhon, Shaune Pascal, MD (Cardiology) Gaynelle Arabian, MD as Consulting Physician (Orthopedic Surgery) Lavera Guise, [facility] (Pharmacist)   Objective:  Lab Results  Component Value Date   CREATININE 0.63 01/20/2021   CREATININE 0.75 08/23/2020   CREATININE 0.85 12/11/2019    Lab Results  Component Value Date   HGBA1C 6.5 02/24/2021   Last diabetic Eye exam: No results found for: HMDIABEYEEXA  Last diabetic Foot exam: No results found for: HMDIABFOOTEX      Component Value Date/Time   CHOL 179 02/24/2021 0901   TRIG 75 02/24/2021 0901   HDL 51 02/24/2021 0901   CHOLHDL 3.5 02/24/2021 0901   CHOLHDL 3.0 07/11/2016 1507   VLDL 13 07/11/2016 1507   LDLCALC 114 (H) 02/24/2021 0901    Hepatic Function Latest Ref Rng & Units 01/20/2021 08/23/2020 12/11/2019  Total Protein 6.5 - 8.1 g/dL 6.5 6.1 5.9(L)  Albumin 3.5 - 5.0 g/dL 3.5 3.7 3.7  AST 15 - 41 U/L _0 ALT  0 - 44 U/L _0 Alk Phosphatase 38 - 126 U/L 54 72 74  Total Bilirubin 0.3 - 1.2 mg/dL 0.9 1.1 0.9  Bilirubin, Direct 0.0 - 0.3 mg/dL - - -    Lab Results  Component Value Date/Time   TSH 1.370 12/11/2019 01:53 PM   TSH 1.910 02/02/2019 10:15 AM    CBC Latest Ref Rng & Units 01/20/2021 02/02/2019  04/08/2018  WBC 4.0 - 10.5 K/uL 7.7 5.6 5.1  Hemoglobin 12.0 - 15.0 g/dL 13.8 14.6 14.1  Hematocrit 36.0 - 46.0 % 40.4 42.1 42.5  Platelets 150 - 400 K/uL 189 159 181    Lab Results  Component Value Date/Time   VD25OH 37.7 02/24/2021 09:01 AM   VD25OH 42.2 04/15/2020 03:31 PM    Clinical ASCVD: Yes  The ASCVD Risk score (Arnett DK, et al., 2019) failed to calculate for the following reasons:   The patient has a prior MI or stroke diagnosis    Other: (CHADS2VASc if Afib, PHQ9 if depression, MMRC or CAT for COPD, ACT, DEXA)  Social History   Tobacco Use  Smoking Status Never  Smokeless Tobacco Never   BP Readings from Last 3 Encounters:  03/10/21 (!) 94/59  03/08/21 120/68  02/24/21 116/79   Pulse Readings from Last 3 Encounters:  03/10/21 68  03/08/21 75  02/24/21 79   Wt Readings from Last 3 Encounters:  03/10/21 253 lb (114.8 kg)  03/08/21 253 lb (114.8 kg)  02/28/21 253 lb (114.8 kg)    Assessment: Review of patient past medical history, allergies, medications, health status, including review of consultants reports, laboratory and other test data, was performed as part of comprehensive evaluation and provision of chronic care management services.   SDOH:  (Social Determinants of Health) assessments and interventions performed:    CCM Care Plan  Allergies  Allergen Reactions   Nifedipine Other (See Comments)    Brings blood pressure up really fast PROCARDIA   Tape     bleeding   Codeine Itching   Crestor [Rosuvastatin] Other (See Comments)    myalgias    Medications Reviewed Today     Reviewed by Standley Brooking, PTA (Physical Therapy Assistant) on 06/06/21 at 1029  Med List Status: <None>   Medication Order Taking? Sig Documenting Provider Last Dose Status Informant  albuterol (VENTOLIN HFA) 108 (90 Base) MCG/ACT inhaler [id number] No TAKE 2 PUFFS BY MOUTH EVERY 6 HOURS AS NEEDED FOR WHEEZE OR SHORTNESS OF BREATH  Patient taking differently: Inhale 2  puffs into the lungs every 6 (six) hours as needed for wheezing or shortness of breath.   Ronnie Doss M, DO Unknown Active   alendronate (FOSAMAX) 70 MG tablet [id number]  TAKE 1 TABLET EVERY 7 DAYS WITH A FULL GLASS OF Virgel Paling ON AN EMPTY STOMACH Ronnie Doss M, DO  Active            Med Note Blanca Friend, JULIE D   Tue Apr 11, 2021  4:15 PM) Started on 04/19/20  Alirocumab (PRALUENT) 75 MG/ML SOAJ [id number]  Inject 75 mg into the skin every 14 (fourteen) days. Ronnie Doss M, DO  Active            Med Note Leisa Lenz May 10, 2021  2:02 PM) Too expensive; hasn't started  ALPRAZolam Duanne Moron) 0.5 MG tablet [id number] No TAKE 1 TABLET BY MOUTH TWICE A DAY AS NEEDED FOR ANXIETY Hawks, Christy A, FNP Unknown Active   amoxicillin (AMOXIL) 500  MG capsule [id number]  Take 1 capsule (500 mg total) by mouth 3 (three) times daily. Claretta Fraise, MD  Active   aspirin EC 81 MG tablet [id number] No Take 81 mg by mouth daily. [provider] 03/09/2021 Active Self  atorvastatin (LIPITOR) 80 MG tablet [id number] No Take 1 tablet (80 mg total) by mouth every other day. Ronnie Doss M, DO 03/10/2021 Active Self  Bempedoic Acid (NEXLETOL) 180 MG TABS [id number]  Take 180 mg by mouth. [provider]  Active            Med Note Blanca Friend, Royce Macadamia   Wed May 10, 2021  2:04 PM) Samples given  betamethasone valerate ointment (VALISONE) 0.1 % [id number] No Apply 1 application topically 2 (two) times daily as needed (psoriasis). (DO NOT apply to face, groin or axilla) Janora Norlander, DO Past Week Active   budesonide-formoterol (SYMBICORT) 80-4.5 MCG/ACT inhaler [id number] No Inhale 2 puffs into the lungs 2 (two) times daily. Ronnie Doss M, DO Unknown Active Self  Calcium Carbonate-Vitamin D (SUPER CALCIUM 600 + D3 PO) [id number] No Take 1 tablet by mouth daily. [provider] 03/10/2021 Active Self  cyclobenzaprine (FLEXERIL) 5 MG tablet [id number] No TAKE 1 TABLET BY MOUTH  THREE TIMES A DAY AS NEEDED FOR MUSCLE SPASMS Hawks, Christy A, FNP Unknown Active   diltiazem (CARDIZEM CD) 120 MG 24 hr capsule [id number] No Take 1 capsule (120 mg total) by mouth daily. Ronnie Doss M, DO 03/10/2021 Active Self  fluticasone (FLONASE) 50 MCG/ACT nasal spray [id number] No SPRAY 1 SPRAY INTO EACH NOSTRIL TWICE A DAY Ronnie Doss M, DO 03/09/2021 Active Self  furosemide (LASIX) 20 MG tablet [id number] No TAKE 1 TABLET BY MOUTH EVERY DAY Ronnie Doss M, DO 03/10/2021 Active Self  losartan (COZAAR) 25 MG tablet [id number]  TAKE 1 TABLET BY MOUTH TWICE A DAY Gottschalk, Ashly M, DO  Active   meclizine (ANTIVERT) 25 MG tablet [id number] No Take 1 tablet (25 mg total) by mouth 2 (two) times daily as needed for dizziness. Reported on 12/05/2015 Sharion Balloon, FNP Unknown Active Self  nitrofurantoin, macrocrystal-monohydrate, (MACROBID) 100 MG capsule [id number]  Take 1 capsule (100 mg total) by mouth 2 (two) times daily. Claretta Fraise, MD  Active   nitroGLYCERIN (NITROSTAT) 0.4 MG SL tablet [id number] No Place 1 tablet (0.4 mg total) under the tongue every 5 (five) minutes as needed for chest pain (x 3 doses). Reported on 12/05/2015 Janora Norlander, DO Unknown Active   omeprazole (PRILOSEC) 20 MG capsule [id number] No TAKE 1 CAPSULE BY MOUTH EVERY DAY Ronnie Doss M, DO 03/10/2021 Active   potassium chloride (KLOR-CON 10) 10 MEQ tablet [id number] No Take 1 tablet (10 mEq total) by mouth daily. Ronnie Doss M, DO 03/10/2021 Active Self  spironolactone (ALDACTONE) 25 MG tablet [id number] No Take 0.5 tablets (12.5 mg total) by mouth 2 (two) times daily. Janora Norlander, DO 03/10/2021 Active             Patient Active Problem List   Diagnosis Date Noted   New onset type 2 diabetes mellitus ([place]) 08/23/2020   COVID-19 virus infection 07/08/2019   Hyperlipidemia associated with type 2 diabetes mellitus ([place]) 08/27/2018   Morbid obesity ([place]) 08/27/2018   Chronic  left-sided low back pain with left-sided sciatica 05/10/2017   Coronary artery disease involving native coronary artery of native heart without angina pectoris 03/20/2017   LBBB (left bundle branch block) 03/20/2017   Age related osteoporosis 10/17/2016   Acute  rhinosinusitis 10/17/2016   Primary osteoarthritis of right hip 02/23/2016   Hypokalemia 01/20/2015   OA (osteoarthritis) of knee 12/22/2012   AVNRT (AV nodal re-entry tachycardia) ([place])    Atrial arrhythmia 07/03/2011   OBSTRUCTIVE SLEEP APNEA 03/15/2010   EDEMA 02/28/2009   Hyperlipidemia 12/09/2008   Hypertension associated with diabetes ([place]) 12/09/2008   Coronary atherosclerosis 12/09/2008   HEMORRHOIDS 12/09/2008   GERD 12/09/2008   PSORIASIS 12/09/2008   Arthritis 12/09/2008   VERTIGO 12/09/2008   CARDIAC MURMUR 12/09/2008    Immunization History  Administered Date(s) Administered   Fluad Quad(high Dose 65+) 05/04/2019, 05/25/2020   Influenza Whole 04/22/2009   Influenza, High Dose Seasonal PF 05/01/2018   Influenza,inj,Quad PF,6+ Mos 04/25/2015   Influenza-Unspecified 05/18/2014, 07/05/2016, 05/13/2017   Moderna Sars-Covid-2 Vaccination 10/12/2019, 11/09/2019, 06/01/2020   Pneumococcal Conjugate-13 06/15/2014   Pneumococcal Polysaccharide-23 04/22/2008, 05/04/2019   Tdap 04/22/2013, 01/11/2021   Zoster Recombinat (Shingrix) 02/24/2021, 04/26/2021   Zoster, Live 08/03/2010    Conditions to be addressed/monitored: HLD and DMII  Care Plan : PHARMD MEDICATION MANAGEMENT  Updates made by Lavera Guise, [place] since 06/07/2021 12:00 AM     Problem: DIESEASE PROGRESSION PREVENTION      Long-Range Goal: HYPERLIPIDEMIA   Recent Progress: Not on track  Priority: High  Note:   Current Barriers:  Unable to achieve control of CHOLESTEROL & T2DM/BLOOD SUGAR   Pharmacist Clinical Goal(s):  patient will verbalize ability to afford treatment regimen achieve control of CHOLESTEROL as evidenced by    through  collaboration with PharmD and provider.   Interventions: 1:1 collaboration with Janora Norlander, DO regarding development and update of comprehensive plan of care as evidenced by provider attestation and co-signature Inter-disciplinary care team collaboration (see longitudinal plan of care) Comprehensive medication review performed; medication list updated in electronic medical record  Hyperlipidemia:  Goal on track: NO. Uncontrolled; current treatment: ATORVASTATIN 80MG EVERY OTHER DAY Intolerances --> rosuvastatin Repatha not covered on insurace  Called in [place] but over >$100/month (COVERED ON INSURANCE UNTIL 06/2022), would enter coverage gap [place]! Continue Atorvastatin TOLERATING ATORVASTATIN 80MG EVERY OTHER DAY STOP NEXLETOL START PRALUENT PATIENT TO COME IN FOR EDUCATION INSTRUCTED PATIENT HOW TO USE GRANT/COPAY CARD Lipid Panel     Component Value Date/Time   CHOL 179 02/24/2021 0901   TRIG 75 02/24/2021 0901   HDL 51 02/24/2021 0901   CHOLHDL 3.5 02/24/2021 0901   CHOLHDL 3.0 07/11/2016 1507   VLDL 13 07/11/2016 1507   LDLCALC 114 (H) 02/24/2021 0901   LABVLDL 14 02/24/2021 0901  Recommended praluent (patient can't afford), will keep in nexletol samples until we have grant reopen, can also try zetia next Assessed patient finances. APPLICATION FOR [employer] GRANT APPROVED --> INSTRUCTED PATIENT TO TAKE TO PHARMACY AND THEN BRING TO ME FOR INJECTION TECHNIQUE/ADDITIONAL EDUCATION; GRANT RUNS OUT 06/2022  - Discussed with patient that praluent can help reduce her cardiac risk significantly while bringing LDL to goal -Control of T2DM/eating healthier diet will be very beneficial as well -->discussed lifestyle changes  START METFORMIN FOR DIABETES/BLOOD SUGAR  Patient Goals/Self-Care Activities patient will:  - take medications as prescribed collaborate with provider on medication access  solutions  Follow Up Plan: Telephone follow up appointment with care management team member scheduled for: 1 MONTH      Medication Assistance:  [place]  Patient's preferred pharmacy is:  [id number] [address]  [place] [address] Phone: [phone] Fax: [phone]  Follow Up:  Patient agrees to Care Plan and Follow-up.  Plan: Face to Face appointment with care management team member scheduled for: 1 WEEK--PT TO CALL ME AND ARRANGE   Regina Eck, PharmD, BCPS Clinical Pharmacist, [place]  II Phone [phone]

## 2021-06-07 NOTE — Patient Instructions (Addendum)
Visit Information  Current Barriers:  Unable to achieve control of CHOLESTEROL & T2DM/BLOOD SUGAR   Pharmacist Clinical Goal(s):  patient will verbalize ability to afford treatment regimen achieve control of CHOLESTEROL as evidenced by  through collaboration with PharmD and provider.   Interventions: 1:1 collaboration with Janora Norlander, DO regarding development and update of comprehensive plan of care as evidenced by provider attestation and co-signature Inter-disciplinary care team collaboration (see longitudinal plan of care) Comprehensive medication review performed; medication list updated in electronic medical record  Hyperlipidemia:  Goal on track: NO. Uncontrolled; current treatment: ATORVASTATIN 80MG  EVERY OTHER DAY Intolerances --> rosuvastatin Repatha not covered on insurace  Called in [place] but over >$100/month (COVERED ON INSURANCE UNTIL 06/2022), would enter coverage gap [place]! Continue Atorvastatin TOLERATING ATORVASTATIN 80MG  EVERY OTHER DAY STOP NEXLETOL START PRALUENT PATIENT TO COME IN FOR EDUCATION INSTRUCTED PATIENT HOW TO USE GRANT/COPAY CARD Lipid Panel     Component Value Date/Time   CHOL 179 02/24/2021 0901   TRIG 75 02/24/2021 0901   HDL 51 02/24/2021 0901   CHOLHDL 3.5 02/24/2021 0901   CHOLHDL 3.0 07/11/2016 1507   VLDL 13 07/11/2016 1507   LDLCALC 114 (H) 02/24/2021 0901   LABVLDL 14 02/24/2021 0901   Recommended praluent (patient can't afford), will keep in nexletol samples until we have grant reopen, can also try zetia next Assessed patient finances. APPLICATION FOR [employer] GRANT APPROVED --> INSTRUCTED PATIENT TO TAKE TO PHARMACY AND THEN BRING TO ME FOR INJECTION TECHNIQUE/ADDITIONAL EDUCATION; GRANT RUNS OUT 06/2022  - Discussed with patient that praluent can help reduce her cardiac risk significantly while bringing LDL to goal -Control of T2DM/eating healthier diet will  be very beneficial as well -->discussed lifestyle changes  START METFORMIN FOR DIABETES/BLOOD SUGAR  Patient Goals/Self-Care Activities patient will:  - take medications as prescribed collaborate with provider on medication access solutions  Follow Up Plan: Telephone follow up appointment with care management team member scheduled for: 1 MONTH   The patient verbalized understanding of instructions, educational materials, and care plan provided today and declined offer to receive copy of patient instructions, educational materials, and care plan.    Signature Regina Eck, PharmD, BCPS Clinical Pharmacist, [place]  II Phone [phone]

## 2021-06-07 NOTE — Patient Instructions (Addendum)
Visit Information  Current Barriers:  Unable to achieve control of CHOLESTEROL & BLOOD SUGAR   Pharmacist Clinical Goal(s):  patient will verbalize ability to afford treatment regimen achieve control of CHOLESTEROL as evidenced by    through collaboration with PharmD and provider.   Interventions: 1:1 collaboration with Janora Norlander, DO regarding development and update of comprehensive plan of care as evidenced by provider attestation and co-signature Inter-disciplinary care team collaboration (see longitudinal plan of care) Comprehensive medication review performed; medication list updated in electronic medical record  Hyperlipidemia:  Goal on track: NO. Uncontrolled; current treatment: ATORVASTATIN 80MG  EVERY OTHER DAY Intolerances --> rosuvastatin Repatha not covered on insurace  Called in [place] but over >$100/month (COVERED ON INSURANCE UNTIL 06/2022), would enter coverage gap APPLICATION FOR [employer] GRANT SUBMITTED Continue Atorvastatin Samples of Nexletol given Consider Zetia if affordable  TRANSITION TO PRALUENT AT FOLLOW UP ONCE GRANT COPAY CARD RECEIVED-->MEDICATION SHOULD BE $O/COPAY Lipid Panel     Component Value Date/Time   CHOL 179 02/24/2021 0901   TRIG 75 02/24/2021 0901   HDL 51 02/24/2021 0901   CHOLHDL 3.5 02/24/2021 0901   CHOLHDL 3.0 07/11/2016 1507   VLDL 13 07/11/2016 1507   LDLCALC 114 (H) 02/24/2021 0901   LABVLDL 14 02/24/2021 0901  Recommended praluent (patient can't afford), will keep in nexletol samples until we have grant reopen, can also try zetia next Assessed patient finances. APPLICATION FOR [employer] GRANT SUBMITTED --> WILL FOLLOW & GET PATIENT STARTED ON PRALUENT ASAP  - Discussed with patient that praluent can help reduce her cardiac risk significantly while bringing LDL to goal -Control of T2DM/eating healthier diet will be very beneficial as well -->discussed lifestyle changes  Patient  Goals/Self-Care Activities patient will:  - take medications as prescribed collaborate with provider on medication access solutions  Follow Up Plan: Telephone follow up appointment with care management team member scheduled for: 1 MONTH      Medication Assistance: [employer] grant submitted today for praluent    The patient verbalized understanding of instructions, educational materials, and care plan provided today and declined offer to receive copy of patient instructions, educational materials, and care plan.   Telephone follow up appointment with care management team member scheduled for: 1 MONTH  Signature Regina Eck, PharmD, BCPS Clinical Pharmacist, [place]  II Phone [phone]

## 2021-06-12 DIAGNOSIS — Z961 Presence of intraocular lens: Secondary | ICD-10-CM | POA: Diagnosis not present

## 2021-06-12 DIAGNOSIS — E119 Type 2 diabetes mellitus without complications: Secondary | ICD-10-CM | POA: Diagnosis not present

## 2021-06-12 DIAGNOSIS — H524 Presbyopia: Secondary | ICD-10-CM | POA: Diagnosis not present

## 2021-06-12 DIAGNOSIS — H52203 Unspecified astigmatism, bilateral: Secondary | ICD-10-CM | POA: Diagnosis not present

## 2021-06-12 LAB — HM DIABETES EYE EXAM

## 2021-06-13 ENCOUNTER — Other Ambulatory Visit: Payer: Self-pay

## 2021-06-13 ENCOUNTER — Encounter: Payer: Self-pay | Admitting: Physical Therapy

## 2021-06-13 ENCOUNTER — Ambulatory Visit (INDEPENDENT_AMBULATORY_CARE_PROVIDER_SITE_OTHER): Payer: Medicare HMO

## 2021-06-13 ENCOUNTER — Ambulatory Visit: Payer: Medicare HMO | Admitting: Physical Therapy

## 2021-06-13 DIAGNOSIS — M25551 Pain in right hip: Secondary | ICD-10-CM | POA: Diagnosis not present

## 2021-06-13 DIAGNOSIS — Z23 Encounter for immunization: Secondary | ICD-10-CM

## 2021-06-13 DIAGNOSIS — M6281 Muscle weakness (generalized): Secondary | ICD-10-CM

## 2021-06-13 NOTE — Therapy (Signed)
[facility] [place], [place], [address] Phone: [phone]   Fax:  [phone]  Physical Therapy Treatment  Patient Details  Name: Sara [name] MRN: [id number] Date of Birth: 1945/11/20 Referring Provider (PT): Swinteck   Encounter Date: 06/13/2021   PT End of Session - 06/13/21 1103     Visit Number 3    Number of Visits 8    Date for PT Re-Evaluation 07/28/21    PT Start Time 6314    PT Stop Time 1113    PT Time Calculation (min) 38 min    Activity Tolerance Patient tolerated treatment well    Behavior During Therapy [facility] for tasks assessed/performed             Past Medical History:  Diagnosis Date   Arthritis    Arthritis -hands -Bil. knee replacemnts   AVNRT (AV nodal re-entry tachycardia) ([place])    s/p RFCA 09/2011   Bronchitis    none recent   CAD (coronary artery disease)    a. s/p Xience DES x 3 to LAD;  b. nuc study 02/27/10: EF 67% no ischemia;  c.  echo 11/12: Mild LVH, EF 97-02%, grade 1 diastolic dysfunction, moderate LAE;  d.  LHC 07/03/11: LAD stents patent, RCA 25%, EF 55-65% ;  e. Adeno. Myoview 5/14:  No ischemia, EF 68%   COVID-19 2021   Depression    Diverticular disease    GERD (gastroesophageal reflux disease)    Heart murmur    Hemorrhoid    Hepatitis    yellow jaundice as a child    HTN (hypertension)    Hx of echocardiogram    a.  Echo (05/2011):  Mild LVH, EF 55-60%, Gr 1 DD, mod LAE.b. Echo (11/14):  Mild LVH, mild focal basal septal hypertrophy, EF 55-60%, Gr 1 DD, mild LAE, atrial septal aneurysm   Hyperlipidemia    Hypertension    Impaired hearing    bilateral- no hearing aids   Jaundice    Myocardial infarction (HCC)    hx of x 2 3-4 years ago per pt   Obesity    OSA (obstructive sleep apnea)    needs no cpap per patient pt denies   Other psoriasis    ongoing- none at present.   Sleep apnea    Vertigo     Past Surgical History:  Procedure Laterality Date    ABDOMINAL HYSTERECTOMY     BACK SURGERY     Dr. Lawernce Pitts   CARDIAC CATHETERIZATION  08/23/2011   Ablation AV  Node   CATARACT EXTRACTION Bilateral    COLONOSCOPY     CORONARY ANGIOPLASTY     with stents 2009    KNEE SURGERY     right   LEFT AND RIGHT HEART CATHETERIZATION WITH CORONARY ANGIOGRAM N/A 08/18/2013   Procedure: LEFT AND RIGHT HEART CATHETERIZATION WITH CORONARY ANGIOGRAM;  Surgeon: Larey Dresser, MD;  Location: [facility] CATH LAB;  Service: Cardiovascular;  Laterality: N/A;   LEFT HEART CATHETERIZATION WITH CORONARY ANGIOGRAM N/A 07/03/2011   Procedure: LEFT HEART CATHETERIZATION WITH CORONARY ANGIOGRAM;  Surgeon: Minus Breeding, MD;  Location: [facility] CATH LAB;  Service: Cardiovascular;  Laterality: N/A;   REPAIR RECTOCELE     SUPRAVENTRICULAR TACHYCARDIA ABLATION N/A 08/23/2011   Procedure: SUPRAVENTRICULAR TACHYCARDIA ABLATION;  Surgeon: Evans Lance, MD;  Location: [facility] CATH LAB;  Service: Cardiovascular;  Laterality: N/A;   TOTAL HIP ARTHROPLASTY Right 02/23/2016   Procedure: RIGHT TOTAL HIP ARTHROPLASTY ANTERIOR APPROACH;  Surgeon: Rod Can, MD;  Location: [facility] ORS;  Service: Orthopedics;  Laterality: Right;   TOTAL KNEE ARTHROPLASTY Left 12/22/2012   Procedure: LEFT TOTAL KNEE ARTHROPLASTY;  Surgeon: Gearlean Alf, MD;  Location: [facility] ORS;  Service: Orthopedics;  Laterality: Left;   TUBAL LIGATION     UPPER GASTROINTESTINAL ENDOSCOPY      There were no vitals filed for this visit.   Subjective Assessment - 06/13/21 1034     Subjective Reports that her leg is better but was sore possibly after riding to and from [place], [place].    Pertinent History HTN, diabetes, OA, chronic low back pain    How long can you sit comfortably? unlimited    How long can you stand comfortably? 15 minutes    How long can you walk comfortably? 15 minutes (due to low back)    Patient Stated Goals improved strength, walk more    Currently in Pain? Yes    Pain Score 4     Pain Location Hip    Pain  Orientation Right    Pain Descriptors / Indicators Discomfort    Pain Type Chronic pain    Pain Onset More than a month ago    Pain Frequency Constant                [facility] PT Assessment - 06/13/21 0001       Assessment   Medical Diagnosis Trochanteric bursitis of right hip    Referring Provider (PT) Swinteck    Next MD Visit None scheduled    Prior Therapy No      Precautions   Precautions None                           [facility] Adult PT Treatment/Exercise - 06/13/21 0001       Modalities   Modalities Electrical Stimulation;Moist Heat      Moist Heat Therapy   Number Minutes Moist Heat 10 Minutes    Moist Heat Location Hip      Electrical Stimulation   Electrical Stimulation Location R greater trochanter    Electrical Stimulation Action Pre-Mod    Electrical Stimulation Parameters 80-150 hz x10 min    Electrical Stimulation Goals Pain;Tone      Manual Therapy   Manual Therapy Soft tissue mobilization    Soft tissue mobilization STW to R ITB, glute, HS to reduce muscle tightness and TPs                          PT Long Term Goals - 06/13/21 1128       PT LONG TERM GOAL #1   Title Patient will be independent with her HEP.    Time 4    Period Weeks    Status Partially Met      PT LONG TERM GOAL #2   Title Patient will be able to stand and walk for at least 25 minutes without being limited by her right hip.    Time 4    Period Weeks    Status On-going      PT LONG TERM GOAL #3   Title Patient will be able demonstrate a 5x sit to stand in 12 seconds or less.    Baseline 18 seconds    Time 4    Period Weeks    Status On-going  Plan - 06/13/21 1128     Clinical Impression Statement Patient presented in clinic with reports of overall improvement in R hip but continued pain. Patient reports partial compliance with HEP but encouraged to continue daily. Patient also instructed in self massage as TPs  and muscle tightness and tenderness still present surrounding R greater trochanter and ITB. Normal modalities response noted following removal of the modalities.    Personal Factors and Comorbidities Comorbidity 1;Comorbidity 2;Comorbidity 3+;Other;Fitness    Comorbidities HTN, diabetes, OA    Examination-Activity Limitations Transfers;Squat;Lift    Examination-Participation Restrictions Church;Cleaning    Stability/Clinical Decision Making Evolving/Moderate complexity    Rehab Potential Good    PT Frequency 2x / week    PT Duration 4 weeks    PT Treatment/Interventions Electrical Stimulation;Iontophoresis 2m/ml Dexamethasone;Moist Heat;Ultrasound;Neuromuscular re-education;Balance training;Therapeutic exercise;Therapeutic activities;Patient/family education;Manual techniques    PT Next Visit Plan nustep, lower extremity strengthening and balance    PT Home Exercise Plan clamshells (20 reps bilaterally in sidelying)    Consulted and Agree with Plan of Care Patient             Patient will benefit from skilled therapeutic intervention in order to improve the following deficits and impairments:  Obesity, Decreased activity tolerance, Decreased balance, Increased edema, Decreased strength  Visit Diagnosis: Muscle weakness (generalized)  Pain in right hip     Problem List Patient Active Problem List   Diagnosis Date Noted   New onset type 2 diabetes mellitus ([place] 08/23/2020   COVID-19 virus infection 07/08/2019   Hyperlipidemia associated with type 2 diabetes mellitus ([place] 08/27/2018   Morbid obesity ([place] 08/27/2018   Chronic left-sided low back pain with left-sided sciatica 05/10/2017   Coronary artery disease involving native coronary artery of native heart without angina pectoris 03/20/2017   LBBB (left bundle branch block) 03/20/2017   Age related osteoporosis 10/17/2016   Acute rhinosinusitis 10/17/2016   Primary osteoarthritis of right hip 02/23/2016   Hypokalemia  01/20/2015   OA (osteoarthritis) of knee 12/22/2012   AVNRT (AV nodal re-entry tachycardia) ([place]    Atrial arrhythmia 07/03/2011   OBSTRUCTIVE SLEEP APNEA 03/15/2010   EDEMA 02/28/2009   Hyperlipidemia 12/09/2008   Hypertension associated with diabetes ([place] 12/09/2008   Coronary atherosclerosis 12/09/2008   HEMORRHOIDS 12/09/2008   GERD 12/09/2008   PSORIASIS 12/09/2008   Arthritis 12/09/2008   VERTIGO 12/09/2008   CARDIAC MURMUR 12/09/2008    KStandley Brooking PTA 06/13/2021, 11:31 AM  [facility] [address] [address]Phone: [phone]  Fax:  [phone] Name: Sara MihelichMRN: [id number] of Birth: [birth year]

## 2021-06-21 ENCOUNTER — Telehealth: Payer: Medicare HMO

## 2021-06-21 DIAGNOSIS — E119 Type 2 diabetes mellitus without complications: Secondary | ICD-10-CM | POA: Diagnosis not present

## 2021-06-21 DIAGNOSIS — E785 Hyperlipidemia, unspecified: Secondary | ICD-10-CM

## 2021-06-21 DIAGNOSIS — E1169 Type 2 diabetes mellitus with other specified complication: Secondary | ICD-10-CM | POA: Diagnosis not present

## 2021-06-22 ENCOUNTER — Ambulatory Visit: Payer: Medicare HMO | Attending: Orthopedic Surgery | Admitting: Physical Therapy

## 2021-06-22 ENCOUNTER — Encounter: Payer: Self-pay | Admitting: Physical Therapy

## 2021-06-22 ENCOUNTER — Other Ambulatory Visit: Payer: Self-pay

## 2021-06-22 DIAGNOSIS — M25551 Pain in right hip: Secondary | ICD-10-CM | POA: Insufficient documentation

## 2021-06-22 DIAGNOSIS — M6281 Muscle weakness (generalized): Secondary | ICD-10-CM | POA: Diagnosis not present

## 2021-06-22 NOTE — Therapy (Addendum)
[facility] [place], [place], [address] Phone: [phone]   Fax:  [phone]  Physical Therapy Treatment  Patient Details  Name: Sara [name] MRN: [id number] Date of Birth: 1946/02/20 Referring Provider (PT): Swinteck   Encounter Date: 06/22/2021   PT End of Session - 06/22/21 1437     Visit Number 4    Number of Visits 8    Date for PT Re-Evaluation 07/28/21    PT Start Time 1324    PT Stop Time 1529    PT Time Calculation (min) 55 min    Activity Tolerance Patient tolerated treatment well    Behavior During Therapy [facility] for tasks assessed/performed             Past Medical History:  Diagnosis Date   Arthritis    Arthritis -hands -Bil. knee replacemnts   AVNRT (AV nodal re-entry tachycardia) ([place])    s/p RFCA 09/2011   Bronchitis    none recent   CAD (coronary artery disease)    a. s/p Xience DES x 3 to LAD;  b. nuc study 02/27/10: EF 67% no ischemia;  c.  echo 11/12: Mild LVH, EF 40-10%, grade 1 diastolic dysfunction, moderate LAE;  d.  LHC 07/03/11: LAD stents patent, RCA 25%, EF 55-65% ;  e. [place]eno. Myoview 5/14:  No ischemia, EF 68%   COVID-19 2021   Depression    Diverticular disease    GERD (gastroesophageal reflux disease)    Heart murmur    Hemorrhoid    Hepatitis    yellow jaundice as a child    HTN (hypertension)    Hx of echocardiogram    a.  Echo (05/2011):  Mild LVH, EF 55-60%, Gr 1 DD, mod LAE.b. Echo (11/14):  Mild LVH, mild focal basal septal hypertrophy, EF 55-60%, Gr 1 DD, mild LAE, atrial septal aneurysm   Hyperlipidemia    Hypertension    Impaired hearing    bilateral- no hearing aids   Jaundice    Myocardial infarction (HCC)    hx of x 2 3-4 years ago per pt   Obesity    OSA (obstructive sleep apnea)    needs no cpap per patient pt denies   Other psoriasis    ongoing- none at present.   Sleep apnea    Vertigo     Past Surgical History:  Procedure Laterality Date    ABDOMINAL HYSTERECTOMY     BACK SURGERY     Dr. Lawernce Pitts   CARDIAC CATHETERIZATION  08/23/2011   Ablation AV  Node   CATARACT EXTRACTION Bilateral    COLONOSCOPY     CORONARY ANGIOPLASTY     with stents 2009    KNEE SURGERY     right   LEFT AND RIGHT HEART CATHETERIZATION WITH CORONARY ANGIOGRAM N/A 08/18/2013   Procedure: LEFT AND RIGHT HEART CATHETERIZATION WITH CORONARY ANGIOGRAM;  Surgeon: Larey Dresser, MD;  Location: [facility] CATH LAB;  Service: Cardiovascular;  Laterality: N/A;   LEFT HEART CATHETERIZATION WITH CORONARY ANGIOGRAM N/A 07/03/2011   Procedure: LEFT HEART CATHETERIZATION WITH CORONARY ANGIOGRAM;  Surgeon: Minus Breeding, MD;  Location: [facility] CATH LAB;  Service: Cardiovascular;  Laterality: N/A;   REPAIR RECTOCELE     SUPRAVENTRICULAR TACHYCARDIA ABLATION N/A 08/23/2011   Procedure: SUPRAVENTRICULAR TACHYCARDIA ABLATION;  Surgeon: Evans Lance, MD;  Location: [facility] CATH LAB;  Service: Cardiovascular;  Laterality: N/A;   TOTAL HIP ARTHROPLASTY Right 02/23/2016   Procedure: RIGHT TOTAL HIP ARTHROPLASTY ANTERIOR APPROACH;  Surgeon: Rod Can, MD;  Location: [facility] ORS;  Service: Orthopedics;  Laterality: Right;   TOTAL KNEE ARTHROPLASTY Left 12/22/2012   Procedure: LEFT TOTAL KNEE ARTHROPLASTY;  Surgeon: Gearlean Alf, MD;  Location: [facility] ORS;  Service: Orthopedics;  Laterality: Left;   TUBAL LIGATION     UPPER GASTROINTESTINAL ENDOSCOPY      There were no vitals filed for this visit.   Subjective Assessment - 06/22/21 1435     Subjective Patient reports no pain.    Pertinent History HTN, diabetes, OA, chronic low back pain    How long can you sit comfortably? unlimited    How long can you stand comfortably? 15 minutes    How long can you walk comfortably? 15 minutes (due to low back)    Patient Stated Goals improved strength, walk more    Currently in Pain? No/denies                [facility] PT Assessment - 06/22/21 0001       Assessment   Medical Diagnosis  Trochanteric bursitis of right hip    Referring Provider (PT) Swinteck    Next MD Visit None scheduled    Prior Therapy No      Precautions   Precautions None                           [facility] Adult PT Treatment/Exercise - 06/22/21 0001       Knee/Hip Exercises: Aerobic   Nustep L3 x10 min      Knee/Hip Exercises: Seated   Clamshell with TheraBand Red   x20 reps   Marching Strengthening;Both;15 reps;Limitations    Marching Limitations red theraband      Modalities   Modalities Electrical Stimulation      Electrical Stimulation   Electrical Stimulation Location R greater trochanter    Electrical Stimulation Action Pre-Mod    Electrical Stimulation Parameters 80-150 hz x10 min    Electrical Stimulation Goals Pain;Tone      Manual Therapy   Manual Therapy Soft tissue mobilization    Soft tissue mobilization STW to R ITB, glute, HS to reduce muscle tightness and TPs                          PT Long Term Goals - 06/22/21 1529       PT LONG TERM GOAL #1   Title Patient will be independent with her HEP.    Time 4    Period Weeks    Status Partially Met      PT LONG TERM GOAL #2   Title Patient will be able to stand and walk for at least 25 minutes without being limited by her right hip.    Time 4    Period Weeks    Status Not Met      PT LONG TERM GOAL #3   Title Patient will be able demonstrate a 5x sit to stand in 12 seconds or less.    Baseline 18 seconds    Time 4    Period Weeks    Status On-going                   Plan - 06/22/21 1524     Clinical Impression Statement Patient presented in clinic with no pain while walking or ambulating stairs but does have some palpable tenderness surrounding the R greater trochanter. Patient able to  tolerate light therex well with resistance. Patient reports compliance with HEP. Patient reported more soreness and tenderness over the R greater trochanter. Patient also presenting with  mod TPR along ITB. Normal stimulation response noted following removal of the modality.    Personal Factors and Comorbidities Comorbidity 1;Comorbidity 2;Comorbidity 3+;Other;Fitness    Comorbidities HTN, diabetes, OA    Examination-Activity Limitations Transfers;Squat;Lift    Examination-Participation Restrictions Church;Cleaning    Stability/Clinical Decision Making Evolving/Moderate complexity    Rehab Potential Good    PT Frequency 2x / week    PT Duration 4 weeks    PT Treatment/Interventions Electrical Stimulation;Iontophoresis 7m/ml Dexamethasone;Moist Heat;Ultrasound;Neuromuscular re-education;Balance training;Therapeutic exercise;Therapeutic activities;Patient/family education;Manual techniques    PT Next Visit Plan On hold due to copay but if she returns try ionto    PT Home Exercise Plan clamshells (20 reps bilaterally in sidelying)    Consulted and Agree with Plan of Care Patient             Patient will benefit from skilled therapeutic intervention in order to improve the following deficits and impairments:  Obesity, Decreased activity tolerance, Decreased balance, Increased edema, Decreased strength  Visit Diagnosis: Muscle weakness (generalized)  Pain in right hip     Problem List Patient Active Problem List   Diagnosis Date Noted   New onset type 2 diabetes mellitus ([place] 08/23/2020   COVID-19 virus infection 07/08/2019   Hyperlipidemia associated with type 2 diabetes mellitus ([place] 08/27/2018   Morbid obesity ([place] 08/27/2018   Chronic left-sided low back pain with left-sided sciatica 05/10/2017   Coronary artery disease involving native coronary artery of native heart without angina pectoris 03/20/2017   LBBB (left bundle branch block) 03/20/2017   Age related osteoporosis 10/17/2016   Acute rhinosinusitis 10/17/2016   Primary osteoarthritis of right hip 02/23/2016   Hypokalemia 01/20/2015   OA (osteoarthritis) of knee 12/22/2012   AVNRT (AV nodal  re-entry tachycardia) ([place]    Atrial arrhythmia 07/03/2011   OBSTRUCTIVE SLEEP APNEA 03/15/2010   EDEMA 02/28/2009   Hyperlipidemia 12/09/2008   Hypertension associated with diabetes ([place] 12/09/2008   Coronary atherosclerosis 12/09/2008   HEMORRHOIDS 12/09/2008   GERD 12/09/2008   PSORIASIS 12/09/2008   Arthritis 12/09/2008   VERTIGO 12/09/2008   CARDIAC MURMUR 12/09/2008    KStandley Brooking PTA 06/22/2021, 4:09 PM  [facility] [address] [place] [address]Phone: [phone]  Fax:  [phone] Name: Sara KittsMRN: [id number] of Birth: [birth year] PHYSICAL THERAPY DISCHARGE SUMMARY  Visits from Start of Care: 4  Current functional level related to goals / functional outcomes: Patient was able to partially meet her goals for physical therapy. However, she is being discharged as she has not attended physical therapy since her appointment on 06/22/21.   Remaining deficits: Lower extremity power and endurance   Education / Equipment: HEP    Patient agrees to discharge. Patient goals were partially met. Patient is being discharged due to not returning since the last visit.  JJacqulynn Cadet PT, DPT

## 2021-06-23 ENCOUNTER — Telehealth: Payer: Medicare HMO

## 2021-06-27 ENCOUNTER — Ambulatory Visit: Payer: Medicare HMO | Admitting: Pharmacist

## 2021-06-27 ENCOUNTER — Telehealth: Payer: Self-pay | Admitting: Family Medicine

## 2021-06-27 DIAGNOSIS — E1169 Type 2 diabetes mellitus with other specified complication: Secondary | ICD-10-CM

## 2021-06-27 DIAGNOSIS — E785 Hyperlipidemia, unspecified: Secondary | ICD-10-CM

## 2021-06-27 DIAGNOSIS — I2510 Atherosclerotic heart disease of native coronary artery without angina pectoris: Secondary | ICD-10-CM

## 2021-06-27 NOTE — Progress Notes (Signed)
Chronic Care Management Pharmacy Note  06/27/2021 Name:  Sara [name] MRN:  [id number] DOB:  1946/05/27  Summary: T2DM, HLD  Recommendations/Changes made from today's visit:  HYPERLIPIDEMIA Uncontrolled; current treatment: ATORVASTATIN 80MG TWICE WEEKLY; PRALUENT Q14 DAYS Intolerances --> rosuvastatin Repatha not covered on insurace  Called in Praluent but over >$100/month (COVERED ON INSURANCE UNTIL 06/2022), HOWEVER patient would enter coverage gap START PRALUENT APPLICATION FOR [employer] GRANT APPROVED! $0 copay using grant foundation card for 1 year total PCSK9 EDUCATION PROVIDED INSTRUCTED PATIENT HOW TO USE GRANT/COPAY CARD PATIENT WAS ABLE TO INJECT IN OFFICE  CONTINUE Atorvastatin HAVING MYOPATHY--DECREASE TO TWICE WEEKLY NOW ON PRALUENT STOP NEXLETOL  Lipid Panel     Component Value Date/Time   CHOL 179 02/24/2021 0901   TRIG 75 02/24/2021 0901   HDL 51 02/24/2021 0901   CHOLHDL 3.5 02/24/2021 0901   CHOLHDL 3.0 07/11/2016 1507   VLDL 13 07/11/2016 1507   LDLCALC 114 (H) 02/24/2021 0901   LABVLDL 14 02/24/2021 0901   Recommended praluent (patient can't afford), will keep in nexletol samples until we have grant reopen, can also try zetia next Assessed patient finances. APPLICATION FOR [employer] GRANT APPROVED --> INSTRUCTED PATIENT TO TAKE TO PHARMACY AND THEN BRING TO ME FOR INJECTION TECHNIQUE/ADDITIONAL EDUCATION; GRANT RUNS OUT 06/2022  - Discussed with patient that praluent can help reduce her cardiac risk significantly while bringing LDL to goal -Control of T2DM/eating healthier diet will be very beneficial as well -->discussed lifestyle changes  START METFORMIN FOR DIABETES/BLOOD SUGAR  Patient Goals/Self-Care Activities patient will:  - take medications as prescribed collaborate with provider on medication access solutions  Follow Up Plan: Telephone follow up appointment with care management team member scheduled for:  1 MONTH  Subjective: Sara [name] is an 75 y.o. year old female who is a primary patient of Janora Norlander, DO.  The CCM team was consulted for assistance with disease management and care coordination needs.    Engaged with patient face to face for follow up visit in response to provider referral for pharmacy case management and/or care coordination services.   Consent to Services:  The patient was given information about Chronic Care Management services, agreed to services, and gave verbal consent prior to initiation of services.  Please see initial visit note for detailed documentation.   Patient Care Team: Janora Norlander, DO as PCP - General (Family Medicine) Bensimhon, Shaune Pascal, MD (Cardiology) Gaynelle Arabian, MD as Consulting Physician (Orthopedic Surgery) Lavera Guise, [facility] (Pharmacist)  Objective:  Lab Results  Component Value Date   CREATININE 0.63 01/20/2021   CREATININE 0.75 08/23/2020   CREATININE 0.85 12/11/2019    Lab Results  Component Value Date   HGBA1C 6.5 02/24/2021   Last diabetic Eye exam: No results found for: HMDIABEYEEXA  Last diabetic Foot exam: No results found for: HMDIABFOOTEX      Component Value Date/Time   CHOL 179 02/24/2021 0901   TRIG 75 02/24/2021 0901   HDL 51 02/24/2021 0901   CHOLHDL 3.5 02/24/2021 0901   CHOLHDL 3.0 07/11/2016 1507   VLDL 13 07/11/2016 1507   LDLCALC 114 (H) 02/24/2021 0901    Hepatic Function Latest Ref Rng & Units 01/20/2021 08/23/2020 12/11/2019  Total Protein 6.5 - 8.1 g/dL 6.5 6.1 5.9(L)  Albumin 3.5 - 5.0 g/dL 3.5 3.7 3.7  AST 15 - 41 U/L '29 16 21  ' ALT 0 - 44 U/L '20 12 12  ' Alk Phosphatase  38 - 126 U/L 54 72 74  Total Bilirubin 0.3 - 1.2 mg/dL 0.9 1.1 0.9  Bilirubin, Direct 0.0 - 0.3 mg/dL - - -    Lab Results  Component Value Date/Time   TSH 1.370 12/11/2019 01:53 PM   TSH 1.910 02/02/2019 10:15 AM    CBC Latest Ref Rng & Units 01/20/2021 02/02/2019 04/08/2018  WBC 4.0 - 10.5 K/uL 7.7  5.6 5.1  Hemoglobin 12.0 - 15.0 g/dL 13.8 14.6 14.1  Hematocrit 36.0 - 46.0 % 40.4 42.1 42.5  Platelets 150 - 400 K/uL 189 159 181    Lab Results  Component Value Date/Time   VD25OH 37.7 02/24/2021 09:01 AM   VD25OH 42.2 04/15/2020 03:31 PM    Clinical ASCVD: Yes  The ASCVD Risk score (Arnett DK, et al., 2019) failed to calculate for the following reasons:   The patient has a prior MI or stroke diagnosis    Other: (CHADS2VASc if Afib, PHQ9 if depression, MMRC or CAT for COPD, ACT, DEXA)  Social History   Tobacco Use  Smoking Status Never  Smokeless Tobacco Never   BP Readings from Last 3 Encounters:  03/10/21 (!) 94/59  03/08/21 120/68  02/24/21 116/79   Pulse Readings from Last 3 Encounters:  03/10/21 68  03/08/21 75  02/24/21 79   Wt Readings from Last 3 Encounters:  03/10/21 253 lb (114.8 kg)  03/08/21 253 lb (114.8 kg)  02/28/21 253 lb (114.8 kg)    Assessment: Review of patient past medical history, allergies, medications, health status, including review of consultants reports, laboratory and other test data, was performed as part of comprehensive evaluation and provision of chronic care management services.   SDOH:  (Social Determinants of Health) assessments and interventions performed:    CCM Care Plan  Allergies  Allergen Reactions   Nifedipine Other (See Comments)    Brings blood pressure up really fast PROCARDIA   Tape     bleeding   Codeine Itching   Crestor [Rosuvastatin] Other (See Comments)    myalgias    Medications Reviewed Today     Reviewed by Standley Brooking, PTA (Physical Therapy Assistant) on 06/22/21 at 1435  Med List Status: <None>   Medication Order Taking? Sig Documenting Provider Last Dose Status Informant  albuterol (VENTOLIN HFA) 108 (90 Base) MCG/ACT inhaler [id number] No TAKE 2 PUFFS BY MOUTH EVERY 6 HOURS AS NEEDED FOR WHEEZE OR SHORTNESS OF BREATH  Patient taking differently: Inhale 2 puffs into the lungs every 6 (six)  hours as needed for wheezing or shortness of breath.   Ronnie Doss M, DO Unknown Active   alendronate (FOSAMAX) 70 MG tablet [id number]  TAKE 1 TABLET EVERY 7 DAYS WITH A FULL GLASS OF Virgel Paling ON AN EMPTY STOMACH Ronnie Doss M, DO  Active            Med Note Blanca Friend, Javana Schey D   Tue Apr 11, 2021  4:15 PM) Started on 04/19/20  Alirocumab (PRALUENT) 75 MG/ML SOAJ [id number]  Inject 75 mg into the skin every 14 (fourteen) days. Janora Norlander, DO  Active            Med Note Leisa Lenz Jun 07, 2021  3:55 PM) VIA [employer] GRANT  ALPRAZolam (XANAX) 0.5 MG tablet [id number] No TAKE 1 TABLET BY MOUTH TWICE A DAY AS NEEDED FOR ANXIETY Hawks, Christy A, FNP Unknown Active   amoxicillin (AMOXIL) 500 MG capsule [id number]  Take 1 capsule (500 mg total)  by mouth 3 (three) times daily. Claretta Fraise, MD  Active   aspirin EC 81 MG tablet [id number] No Take 81 mg by mouth daily. [provider] 03/09/2021 Active Self  atorvastatin (LIPITOR) 80 MG tablet [id number] No Take 1 tablet (80 mg total) by mouth every other day. Ronnie Doss M, DO 03/10/2021 Active Self  betamethasone valerate ointment (VALISONE) 0.1 % [id number] No Apply 1 application topically 2 (two) times daily as needed (psoriasis). (DO NOT apply to face, groin or axilla) Janora Norlander, DO Past Week Active   budesonide-formoterol (SYMBICORT) 80-4.5 MCG/ACT inhaler [id number] No Inhale 2 puffs into the lungs 2 (two) times daily. Ronnie Doss M, DO Unknown Active Self  Calcium Carbonate-Vitamin D (SUPER CALCIUM 600 + D3 PO) [id number] No Take 1 tablet by mouth daily. [provider] 03/10/2021 Active Self  cyclobenzaprine (FLEXERIL) 5 MG tablet [id number] No TAKE 1 TABLET BY MOUTH THREE TIMES A DAY AS NEEDED FOR MUSCLE SPASMS Hawks, Christy A, FNP Unknown Active   diltiazem (CARDIZEM CD) 120 MG 24 hr capsule [id number] No Take 1 capsule (120 mg total) by mouth daily. Ronnie Doss M, DO  03/10/2021 Active Self  fluticasone (FLONASE) 50 MCG/ACT nasal spray [id number] No SPRAY 1 SPRAY INTO EACH NOSTRIL TWICE A DAY Ronnie Doss M, DO 03/09/2021 Active Self  furosemide (LASIX) 20 MG tablet [id number] No TAKE 1 TABLET BY MOUTH EVERY DAY Ronnie Doss M, DO 03/10/2021 Active Self  losartan (COZAAR) 25 MG tablet [id number]  TAKE 1 TABLET BY MOUTH TWICE A DAY Gottschalk, Ashly M, DO  Active   meclizine (ANTIVERT) 25 MG tablet [id number] No Take 1 tablet (25 mg total) by mouth 2 (two) times daily as needed for dizziness. Reported on 12/05/2015 Sharion Balloon, FNP Unknown Active Self  nitrofurantoin, macrocrystal-monohydrate, (MACROBID) 100 MG capsule [id number]  Take 1 capsule (100 mg total) by mouth 2 (two) times daily. Claretta Fraise, MD  Active   nitroGLYCERIN (NITROSTAT) 0.4 MG SL tablet [id number] No Place 1 tablet (0.4 mg total) under the tongue every 5 (five) minutes as needed for chest pain (x 3 doses). Reported on 12/05/2015 Janora Norlander, DO Unknown Active   omeprazole (PRILOSEC) 20 MG capsule [id number] No TAKE 1 CAPSULE BY MOUTH EVERY DAY Ronnie Doss M, DO 03/10/2021 Active   potassium chloride (KLOR-CON 10) 10 MEQ tablet [id number] No Take 1 tablet (10 mEq total) by mouth daily. Ronnie Doss M, DO 03/10/2021 Active Self  spironolactone (ALDACTONE) 25 MG tablet [id number] No Take 0.5 tablets (12.5 mg total) by mouth 2 (two) times daily. Janora Norlander, DO 03/10/2021 Active             Patient Active Problem List   Diagnosis Date Noted   New onset type 2 diabetes mellitus ([place]) 08/23/2020   COVID-19 virus infection 07/08/2019   Hyperlipidemia associated with type 2 diabetes mellitus ([place]) 08/27/2018   Morbid obesity ([place]) 08/27/2018   Chronic left-sided low back pain with left-sided sciatica 05/10/2017   Coronary artery disease involving native coronary artery of native heart without angina pectoris 03/20/2017   LBBB (left bundle branch block) 03/20/2017    Age related osteoporosis 10/17/2016   Acute rhinosinusitis 10/17/2016   Primary osteoarthritis of right hip 02/23/2016   Hypokalemia 01/20/2015   OA (osteoarthritis) of knee 12/22/2012   AVNRT (AV nodal re-entry tachycardia) ([place])    Atrial arrhythmia 07/03/2011   OBSTRUCTIVE SLEEP APNEA 03/15/2010   EDEMA 02/28/2009   Hyperlipidemia 12/09/2008   Hypertension associated with diabetes ([place])  12/09/2008   Coronary atherosclerosis 12/09/2008   HEMORRHOIDS 12/09/2008   GERD 12/09/2008   PSORIASIS 12/09/2008   Arthritis 12/09/2008   VERTIGO 12/09/2008   CARDIAC MURMUR 12/09/2008    Immunization History  Administered Date(s) Administered   Fluad Quad(high Dose 65+) 05/04/2019, 05/25/2020, 06/13/2021   Influenza Whole 04/22/2009   Influenza, High Dose Seasonal PF 05/01/2018   Influenza,inj,Quad PF,6+ Mos 04/25/2015   Influenza-Unspecified 05/18/2014, 07/05/2016, 05/13/2017   Moderna Sars-Covid-2 Vaccination 10/12/2019, 11/09/2019, 06/01/2020   Pneumococcal Conjugate-13 06/15/2014   Pneumococcal Polysaccharide-23 04/22/2008, 05/04/2019   Tdap 04/22/2013, 01/11/2021   Zoster Recombinat (Shingrix) 02/24/2021, 04/26/2021   Zoster, Live 08/03/2010    Conditions to be addressed/monitored: HLD and DMII  Care Plan : PHARMD MEDICATION MANAGEMENT  Updates made by Lavera Guise, RPH since 06/28/2021 12:00 AM     Problem: DIESEASE PROGRESSION PREVENTION      Long-Range Goal: HYPERLIPIDEMIA, T2DM   Recent Progress: Not on track  Priority: High  Note:   Current Barriers:  Unable to achieve control of CHOLESTEROL & T2DM/BLOOD SUGAR   Pharmacist Clinical Goal(s):  patient will verbalize ability to afford treatment regimen achieve control of CHOLESTEROL as evidenced by    through collaboration with PharmD and provider.   Interventions: 1:1 collaboration with Janora Norlander, DO regarding development and update of comprehensive plan of care as evidenced by provider attestation and  co-signature Inter-disciplinary care team collaboration (see longitudinal plan of care) Comprehensive medication review performed; medication list updated in electronic medical record  HYPERLIPIDEMIA Uncontrolled; current treatment: ATORVASTATIN 80MG TWICE WEEKLY; PRALUENT Q14 DAYS Intolerances --> rosuvastatin Repatha not covered on insurace  Called in [place] but over >$100/month (COVERED ON INSURANCE UNTIL 06/2022), HOWEVER patient would enter coverage gap START [place]! $0 copay using grant foundation card for 1 year total PATIENT TO COME IN FOR EDUCATION once she picks up RX INSTRUCTED PATIENT HOW TO USE GRANT/COPAY CARD PATIENT WAS ABLE TO INJECT IN OFFICE  CONTINUE Atorvastatin HAVING MYOPATHY--DECREASE TO TWICE WEEKLY NOW ON PRALUENT STOP NEXLETOL  Lipid Panel     Component Value Date/Time   CHOL 179 02/24/2021 0901   TRIG 75 02/24/2021 0901   HDL 51 02/24/2021 0901   CHOLHDL 3.5 02/24/2021 0901   CHOLHDL 3.0 07/11/2016 1507   VLDL 13 07/11/2016 1507   LDLCALC 114 (H) 02/24/2021 0901   LABVLDL 14 02/24/2021 0901  Recommended praluent (patient can't afford), will keep in nexletol samples until we have grant reopen, can also try zetia next Assessed patient finances. APPLICATION FOR [employer] GRANT APPROVED --> INSTRUCTED PATIENT TO TAKE TO PHARMACY AND THEN BRING TO ME FOR INJECTION TECHNIQUE/ADDITIONAL EDUCATION; GRANT RUNS OUT 06/2022  - Discussed with patient that praluent can help reduce her cardiac risk significantly while bringing LDL to goal -Control of T2DM/eating healthier diet will be very beneficial as well -->discussed lifestyle changes  START METFORMIN FOR DIABETES/BLOOD SUGAR  Patient Goals/Self-Care Activities patient will:  - take medications as prescribed collaborate with provider on medication access solutions  Follow Up Plan: Telephone follow up appointment with care management team  member scheduled for: 1 MONTH      Medication Assistance:  [place]  Patient's [facility] pharmacy is:  [id number] [place]Phone: [phone]: [phone]  Follow Up:  Patient agrees to Care Plan and Follow-up.  Plan: Telephone follow up appointment with care  management team member scheduled for:  6 WEEKS    Regina Eck, PharmD, BCPS Clinical Pharmacist, [place]  II Phone [phone]

## 2021-06-27 NOTE — Telephone Encounter (Signed)
Left message for patient to call back and schedule Medicare Annual Wellness Visit (AWV) to be completed by video or phone.   Last AWV: 05/21/2017  Please schedule at anytime with [place]  45 minute appointment  Any questions, please contact me at [phone]

## 2021-06-28 NOTE — Patient Instructions (Signed)
Visit Information  Thank you for taking time to visit with me today. Please don't hesitate to contact me if I can be of assistance to you before our next scheduled telephone appointment.  Following are the goals we discussed today:   HYPERLIPIDEMIA Uncontrolled; current treatment: ATORVASTATIN 80MG  TWICE WEEKLY; PRALUENT Q14 DAYS Intolerances --> rosuvastatin Repatha not covered on insurace  Called in Praluent but over >$100/month (COVERED ON INSURANCE UNTIL 06/2022), HOWEVER patient would enter coverage gap START PRALUENT APPLICATION FOR [employer] GRANT APPROVED! $0 copay using grant foundation card for 1 year total PCSK9 EDUCATION PROVIDED INSTRUCTED PATIENT HOW TO USE GRANT/COPAY CARD PATIENT WAS ABLE TO INJECT IN OFFICE  CONTINUE Atorvastatin HAVING MYOPATHY--DECREASE TO TWICE WEEKLY NOW ON PRALUENT STOP NEXLETOL  Lipid Panel     Component Value Date/Time   CHOL 179 02/24/2021 0901   TRIG 75 02/24/2021 0901   HDL 51 02/24/2021 0901   CHOLHDL 3.5 02/24/2021 0901   CHOLHDL 3.0 07/11/2016 1507   VLDL 13 07/11/2016 1507   LDLCALC 114 (H) 02/24/2021 0901   LABVLDL 14 02/24/2021 0901   Recommended praluent (patient can't afford), will keep in nexletol samples until we have grant reopen, can also try zetia next Assessed patient finances. APPLICATION FOR [employer] GRANT APPROVED --> INSTRUCTED PATIENT TO TAKE TO PHARMACY AND THEN BRING TO ME FOR INJECTION TECHNIQUE/ADDITIONAL EDUCATION; GRANT RUNS OUT 06/2022  - Discussed with patient that praluent can help reduce her cardiac risk significantly while bringing LDL to goal -Control of T2DM/eating healthier diet will be very beneficial as well -->discussed lifestyle changes  START METFORMIN FOR DIABETES/BLOOD SUGAR  Patient Goals/Self-Care Activities patient will:  - take medications as prescribed collaborate with provider on medication access solutions  Follow Up Plan: Telephone follow up appointment  with care management team member scheduled for: 1 MONTH   Please call the care guide team at [phone] if you need to cancel or reschedule your appointment.  The patient verbalized understanding of instructions, educational materials, and care plan provided today and declined offer to receive copy of patient instructions, educational materials, and care plan.   Signature Regina Eck, PharmD, BCPS Clinical Pharmacist, [place]  II Phone [phone]

## 2021-07-05 ENCOUNTER — Ambulatory Visit (INDEPENDENT_AMBULATORY_CARE_PROVIDER_SITE_OTHER): Payer: Medicare HMO

## 2021-07-05 VITALS — Ht 64.0 in | Wt 253.0 lb

## 2021-07-05 DIAGNOSIS — H9193 Unspecified hearing loss, bilateral: Secondary | ICD-10-CM | POA: Diagnosis not present

## 2021-07-05 DIAGNOSIS — Z0001 Encounter for general adult medical examination with abnormal findings: Secondary | ICD-10-CM

## 2021-07-05 DIAGNOSIS — Z0000 Encounter for general adult medical examination without abnormal findings: Secondary | ICD-10-CM

## 2021-07-05 NOTE — Patient Instructions (Signed)
[name] , Thank you for taking time to come for your Medicare Wellness Visit. I appreciate your ongoing commitment to your health goals. Please review the following plan we discussed and let me know if I can assist you in the future.   Screening recommendations/referrals: Colonoscopy: Done 03/10/2021 No longer required. Mammogram: Done 06/01/2021 Repeat annually  Bone Density: Done 04/07/2020 Repeat every 2 years  Recommended yearly ophthalmology/optometry visit for glaucoma screening and checkup Recommended yearly dental visit for hygiene and checkup  Vaccinations: Influenza vaccine: Done 06/13/2021 Repeat annually  Pneumococcal vaccine: Done 06/15/2014 and 05/04/2019 Tdap vaccine: Done 01/11/2021 Repeat in 10 years  Shingles vaccine: Done 08/03/2010, 02/24/2021 and 04/26/2021.   Covid-19:Done 10/12/2019, 11/09/2019, 06/01/2020 and 01/04/2021.  Advanced directives: Advance directive discussed with you today. Even though you declined this today, please call our office should you change your mind, and we can give you the proper paperwork for you to fill out.   Conditions/risks identified: Aim for 30 minutes of exercise or walking each day, drink 6-8 glasses of water and eat lots of fruits and vegetables.   Next appointment: Follow up in one year for your annual wellness visit 2023.   Preventive Care 76 Years and Older, Female Preventive care refers to lifestyle choices and visits with your health care provider that can promote health and wellness. What does preventive care include? A yearly physical exam. This is also called an annual well check. Dental exams once or twice a year. Routine eye exams. Ask your health care provider how often you should have your eyes checked. Personal lifestyle choices, including: Daily care of your teeth and gums. Regular physical activity. Eating a healthy diet. Avoiding tobacco and drug use. Limiting alcohol use. Practicing safe sex. Taking low-dose  aspirin every day. Taking vitamin and mineral supplements as recommended by your health care provider. What happens during an annual well check? The services and screenings done by your health care provider during your annual well check will depend on your age, overall health, lifestyle risk factors, and family history of disease. Counseling  Your health care provider may ask you questions about your: Alcohol use. Tobacco use. Drug use. Emotional well-being. Home and relationship well-being. Sexual activity. Eating habits. History of falls. Memory and ability to understand (cognition). Work and work Statistician. Reproductive health. Screening  You may have the following tests or measurements: Height, weight, and BMI. Blood pressure. Lipid and cholesterol levels. These may be checked every 5 years, or more frequently if you are over 76 years old. Skin check. Lung cancer screening. You may have this screening every year starting at age 76 if you have a 30-pack-year history of smoking and currently smoke or have quit within the past 15 years. Fecal occult blood test (FOBT) of the stool. You may have this test every year starting at age 76. Flexible sigmoidoscopy or colonoscopy. You may have a sigmoidoscopy every 5 years or a colonoscopy every 10 years starting at age 76. Hepatitis C blood test. Hepatitis B blood test. Sexually transmitted disease (STD) testing. Diabetes screening. This is done by checking your blood sugar (glucose) after you have not eaten for a while (fasting). You may have this done every 1-3 years. Bone density scan. This is done to screen for osteoporosis. You may have this done starting at age 76. Mammogram. This may be done every 1-2 years. Talk to your health care provider about how often you should have regular mammograms. Talk with your health care provider about your  test results, treatment options, and if necessary, the need for more tests. Vaccines  Your  health care provider may recommend certain vaccines, such as: Influenza vaccine. This is recommended every year. Tetanus, diphtheria, and acellular pertussis (Tdap, Td) vaccine. You may need a Td booster every 10 years. Zoster vaccine. You may need this after age 76. Pneumococcal 13-valent conjugate (PCV13) vaccine. One dose is recommended after age 76. Pneumococcal polysaccharide (PPSV23) vaccine. One dose is recommended after age 76. Talk to your health care provider about which screenings and vaccines you need and how often you need them. This information is not intended to replace advice given to you by your health care provider. Make sure you discuss any questions you have with your health care provider. Document Released: 08/05/2015 Document Revised: 03/28/2016 Document Reviewed: 05/10/2015 Elsevier Interactive Patient Education  2017 [place] Prevention in the Home Falls can cause injuries. They can happen to people of all ages. There are many things you can do to make your home safe and to help prevent falls. What can I do on the outside of my home? Regularly fix the edges of walkways and driveways and fix any cracks. Remove anything that might make you trip as you walk through a door, such as a raised step or threshold. Trim any bushes or trees on the path to your home. Use bright outdoor lighting. Clear any walking paths of anything that might make someone trip, such as rocks or tools. Regularly check to see if handrails are loose or broken. Make sure that both sides of any steps have handrails. Any raised decks and porches should have guardrails on the edges. Have any leaves, snow, or ice cleared regularly. Use sand or salt on walking paths during winter. Clean up any spills in your garage right away. This includes oil or grease spills. What can I do in the bathroom? Use night lights. Install grab bars by the toilet and in the tub and shower. Do not use towel bars as  grab bars. Use non-skid mats or decals in the tub or shower. If you need to sit down in the shower, use a plastic, non-slip stool. Keep the floor dry. Clean up any water that spills on the floor as soon as it happens. Remove soap buildup in the tub or shower regularly. Attach bath mats securely with double-sided non-slip rug tape. Do not have throw rugs and other things on the floor that can make you trip. What can I do in the bedroom? Use night lights. Make sure that you have a light by your bed that is easy to reach. Do not use any sheets or blankets that are too big for your bed. They should not hang down onto the floor. Have a firm chair that has side arms. You can use this for support while you get dressed. Do not have throw rugs and other things on the floor that can make you trip. What can I do in the kitchen? Clean up any spills right away. Avoid walking on wet floors. Keep items that you use a lot in easy-to-reach places. If you need to reach something above you, use a strong step stool that has a grab bar. Keep electrical cords out of the way. Do not use floor polish or wax that makes floors slippery. If you must use wax, use non-skid floor wax. Do not have throw rugs and other things on the floor that can make you trip. What can I do with my  stairs? Do not leave any items on the stairs. Make sure that there are handrails on both sides of the stairs and use them. Fix handrails that are broken or loose. Make sure that handrails are as long as the stairways. Check any carpeting to make sure that it is firmly attached to the stairs. Fix any carpet that is loose or worn. Avoid having throw rugs at the top or bottom of the stairs. If you do have throw rugs, attach them to the floor with carpet tape. Make sure that you have a light switch at the top of the stairs and the bottom of the stairs. If you do not have them, ask someone to add them for you. What else can I do to help prevent  falls? Wear shoes that: Do not have high heels. Have rubber bottoms. Are comfortable and fit you well. Are closed at the toe. Do not wear sandals. If you use a stepladder: Make sure that it is fully opened. Do not climb a closed stepladder. Make sure that both sides of the stepladder are locked into place. Ask someone to hold it for you, if possible. Clearly mark and make sure that you can see: Any grab bars or handrails. First and last steps. Where the edge of each step is. Use tools that help you move around (mobility aids) if they are needed. These include: Canes. Walkers. Scooters. Crutches. Turn on the lights when you go into a dark area. Replace any light bulbs as soon as they burn out. Set up your furniture so you have a clear path. Avoid moving your furniture around. If any of your floors are uneven, fix them. If there are any pets around you, be aware of where they are. Review your medicines with your doctor. Some medicines can make you feel dizzy. This can increase your chance of falling. Ask your doctor what other things that you can do to help prevent falls. This information is not intended to replace advice given to you by your health care provider. Make sure you discuss any questions you have with your health care provider. Document Released: 05/05/2009 Document Revised: 12/15/2015 Document Reviewed: 08/13/2014 Elsevier Interactive Patient Education  2017 [employer].

## 2021-07-05 NOTE — Progress Notes (Signed)
Subjective:   Sara [name] is a 75 y.o. female who presents for Medicare Annual (Subsequent) preventive examination. Virtual Visit via Telephone Note  I connected with  Sara [name] on 07/05/21 at  9:45 AM EST by telephone and verified that I am speaking with the correct person using two identifiers.  Location: Patient: Home Provider: [facility] Persons participating in the virtual visit: patient/Nurse Health Advisor   I discussed the limitations, risks, security and privacy concerns of performing an evaluation and management service by telephone and the availability of in person appointments. The patient expressed understanding and agreed to proceed.  Interactive audio and video telecommunications were attempted between this nurse and patient, however failed, due to patient having technical difficulties OR patient did not have access to video capability.  We continued and completed visit with audio only.  Some vital signs may be absent or patient reported.   Chriss Driver, LPN  Review of Systems     Cardiac Risk Factors include: advanced age (>76men, >83 women);diabetes mellitus;dyslipidemia;hypertension;sedentary lifestyle;obesity (BMI >30kg/m2)     Objective:    Today's Vitals   07/05/21 0944  Weight: 253 lb (114.8 kg)  Height: 5\' 4"  (1.626 m)   Body mass index is 43.43 kg/m.  Advanced Directives 07/05/2021 05/29/2021 01/20/2021 05/21/2017 05/15/2017 02/24/2016 02/23/2016  Does Patient Have a Medical Advance Directive? No No No No No No No  Would patient like information on creating a medical advance directive? No - Patient declined - No - Patient declined No - Patient declined - - No - patient declined information  Pre-existing out of facility DNR order (yellow form or pink MOST form) - - - - - - -    Current Medications (verified) Outpatient Encounter Medications as of 07/05/2021  Medication Sig   albuterol (VENTOLIN HFA) 108 (90 Base) MCG/ACT inhaler TAKE 2  PUFFS BY MOUTH EVERY 6 HOURS AS NEEDED FOR WHEEZE OR SHORTNESS OF BREATH (Patient taking differently: Inhale 2 puffs into the lungs every 6 (six) hours as needed for wheezing or shortness of breath.)   alendronate (FOSAMAX) 70 MG tablet TAKE 1 TABLET EVERY 7 DAYS WITH A FULL GLASS OF WTER ON AN EMPTY STOMACH   Alirocumab (PRALUENT) 75 MG/ML SOAJ Inject 75 mg into the skin every 14 (fourteen) days.   ALPRAZolam (XANAX) 0.5 MG tablet TAKE 1 TABLET BY MOUTH TWICE A DAY AS NEEDED FOR ANXIETY   amoxicillin (AMOXIL) 500 MG capsule Take 1 capsule (500 mg total) by mouth 3 (three) times daily.   aspirin EC 81 MG tablet Take 81 mg by mouth daily.   atorvastatin (LIPITOR) 80 MG tablet Take 1 tablet (80 mg total) by mouth every other day.   betamethasone valerate ointment (VALISONE) 0.1 % Apply 1 application topically 2 (two) times daily as needed (psoriasis). (DO NOT apply to face, groin or axilla)   budesonide-formoterol (SYMBICORT) 80-4.5 MCG/ACT inhaler Inhale 2 puffs into the lungs 2 (two) times daily.   Calcium Carbonate-Vitamin D (SUPER CALCIUM 600 + D3 PO) Take 1 tablet by mouth daily.   cyclobenzaprine (FLEXERIL) 5 MG tablet TAKE 1 TABLET BY MOUTH THREE TIMES A DAY AS NEEDED FOR MUSCLE SPASMS   diltiazem (CARDIZEM CD) 120 MG 24 hr capsule Take 1 capsule (120 mg total) by mouth daily.   fluticasone (FLONASE) 50 MCG/ACT nasal spray SPRAY 1 SPRAY INTO EACH NOSTRIL TWICE A DAY   furosemide (LASIX) 20 MG tablet TAKE 1 TABLET BY MOUTH EVERY DAY   losartan (COZAAR)  25 MG tablet TAKE 1 TABLET BY MOUTH TWICE A DAY   meclizine (ANTIVERT) 25 MG tablet Take 1 tablet (25 mg total) by mouth 2 (two) times daily as needed for dizziness. Reported on 12/05/2015   nitrofurantoin, macrocrystal-monohydrate, (MACROBID) 100 MG capsule Take 1 capsule (100 mg total) by mouth 2 (two) times daily.   nitroGLYCERIN (NITROSTAT) 0.4 MG SL tablet Place 1 tablet (0.4 mg total) under the tongue every 5 (five) minutes as needed for  chest pain (x 3 doses). Reported on 12/05/2015   omeprazole (PRILOSEC) 20 MG capsule TAKE 1 CAPSULE BY MOUTH EVERY DAY   potassium chloride (KLOR-CON 10) 10 MEQ tablet Take 1 tablet (10 mEq total) by mouth daily.   SHINGRIX injection    spironolactone (ALDACTONE) 25 MG tablet Take 0.5 tablets (12.5 mg total) by mouth 2 (two) times daily.   [DISCONTINUED] aspirin 81 MG chewable tablet aspirin 81 mg chewable tablet  Chew 1 tablet every day by oral route.   No facility-administered encounter medications on file as of 07/05/2021.    Allergies (verified) Latex, Nifedipine, Tape, Codeine, and Crestor [rosuvastatin]   History: Past Medical History:  Diagnosis Date   Arthritis    Arthritis -hands -Bil. knee replacemnts   AVNRT (AV nodal re-entry tachycardia) ([place])    s/p RFCA 09/2011   Bronchitis    none recent   CAD (coronary artery disease)    a. s/p Xience DES x 3 to LAD;  b. nuc study 02/27/10: EF 67% no ischemia;  c.  echo 11/12: Mild LVH, EF 40-98%, grade 1 diastolic dysfunction, moderate LAE;  d.  LHC 07/03/11: LAD stents patent, RCA 25%, EF 55-65% ;  e. [place]. Myoview 5/14:  No ischemia, EF 68%   COVID-19 2021   Depression    Diverticular disease    GERD (gastroesophageal reflux disease)    Heart murmur    Hemorrhoid    Hepatitis    yellow jaundice as a child    HTN (hypertension)    Hx of echocardiogram    a.  Echo (05/2011):  Mild LVH, EF 55-60%, Gr 1 DD, mod LAE.b. Echo (11/14):  Mild LVH, mild focal basal septal hypertrophy, EF 55-60%, Gr 1 DD, mild LAE, atrial septal aneurysm   Hyperlipidemia    Hypertension    Impaired hearing    bilateral- no hearing aids   Jaundice    Myocardial infarction (HCC)    hx of x 2 3-4 years ago per pt   Obesity    OSA (obstructive sleep apnea)    needs no cpap per patient pt denies   Other psoriasis    ongoing- none at present.   Sleep apnea    Vertigo    Past Surgical History:  Procedure Laterality Date   ABDOMINAL HYSTERECTOMY      BACK SURGERY     Dr. Lawernce Pitts   CARDIAC CATHETERIZATION  08/23/2011   Ablation AV  Node   CATARACT EXTRACTION Bilateral    COLONOSCOPY     CORONARY ANGIOPLASTY     with stents 2009    KNEE SURGERY     right   LEFT AND RIGHT HEART CATHETERIZATION WITH CORONARY ANGIOGRAM N/A 08/18/2013   Procedure: LEFT AND RIGHT HEART CATHETERIZATION WITH CORONARY ANGIOGRAM;  Surgeon: Larey Dresser, MD;  [facility] C[facility] LAB;  Service: Cardiovascular;  Laterality: N/A;   LEFT HEART CATHETERIZATION WITH CORONARY ANGIOGRAM N/A 07/03/2011   Procedure: LEFT HEART CATHETERIZATION WITH CORONARY ANGIOGRAM;  Surgeon: Minus Breeding, MD;  Location: [place] CATH LAB;  Service: Cardiovascular;  Laterality: N/A;   REPAIR RECTOCELE     SUPRAVENTRICULAR TACHYCARDIA ABLATION N/A 08/23/2011   Procedure: SUPRAVENTRICULAR TACHYCARDIA ABLATION;  Surgeon: Evans Lance, MD;  Location: [facility] CATH LAB;  Service: Cardiovascular;  Laterality: N/A;   TOTAL HIP ARTHROPLASTY Right 02/23/2016   Procedure: RIGHT TOTAL HIP ARTHROPLASTY ANTERIOR APPROACH;  Surgeon: Rod Can, MD;  Location: [facility] ORS;  Service: Orthopedics;  Laterality: Right;   TOTAL KNEE ARTHROPLASTY Left 12/22/2012   Procedure: LEFT TOTAL KNEE ARTHROPLASTY;  Surgeon: Gearlean Alf, MD;  Location: [facility] ORS;  Service: Orthopedics;  Laterality: Left;   TUBAL LIGATION     UPPER GASTROINTESTINAL ENDOSCOPY     Family History  Problem Relation Age of Onset   Stroke Mother    Hypertension Mother    Heart failure Mother    Breast cancer Mother    Aneurysm Father    Seizures Brother    Muscular dystrophy Daughter        dx as infant, passed age 27   Seizures Son    Colon cancer Neg Hx    Esophageal cancer Neg Hx    Rectal cancer Neg Hx    Stomach cancer Neg Hx    Social History   Socioeconomic History   Marital status: Married    Spouse name: Mortimer Fries   Number of children: 3   Years of education: Not on file   Highest education level: Not on file  Occupational  History   Occupation: hairdresser  Tobacco Use   Smoking status: Never   Smokeless tobacco: Never  Vaping Use   Vaping Use: Never used  Substance and Sexual Activity   Alcohol use: No   Drug use: No   Sexual activity: Not Currently    Birth control/protection: Post-menopausal  Other Topics Concern   Not on file  Social History Narrative   Lives with husband, Mortimer Fries. Married x 34 years in Oct 27, 2020.   1 son died in 27-Oct-2017 from [employer].    Social Determinants of Health   Financial Resource Strain: Low Risk    Difficulty of Paying Living Expenses: Not hard at all  Food Insecurity: No Food Insecurity   Worried About Charity fundraiser in the Last Year: Never true   [place] in the Last Year: Never true  Transportation Needs: No Transportation Needs   Lack of Transportation (Medical): No   Lack of Transportation (Non-Medical): No  Physical Activity: Insufficiently Active   Days of Exercise per Week: 2 days   Minutes of Exercise per Session: 10 min  Stress: No Stress Concern Present   Feeling of Stress : Only a little  Social Connections: Engineer, building services of Communication with Friends and Family: More than three times a week   Frequency of Social Gatherings with Friends and Family: More than three times a week   Attends Religious Services: More than 4 times per year   Active Member of [employer] or Organizations: Yes   Attends Music therapist: More than 4 times per year   Marital Status: Married    Tobacco Counseling Counseling given: Not Answered   Clinical Intake:  Pre-visit preparation completed: Yes  Pain : No/denies pain     BMI - recorded: 43.43 Nutritional Status: BMI > 30  Obese Nutritional Risks: None Diabetes: No  How often do you need to have someone help you when you read instructions, pamphlets, or other written materials from your  doctor or pharmacy?: 1 - Never  Diabetic? Pt states she is not diabetic and is not on  medication.  Interpreter Needed?: No  Information entered by :: MJ Jessia Kief, LPN   Activities of Daily Living In your present state of health, do you have any difficulty performing the following activities: 07/05/2021  Hearing? Y  Comment Referral placed for ENT today.  Vision? N  Difficulty concentrating or making decisions? Y  Walking or climbing stairs? Y  Comment Due to hip and knee issues.  Doing errands, shopping? N  Preparing Food and eating ? N  Using the Toilet? N  In the past six months, have you accidently leaked urine? Y  Do you have problems with loss of bowel control? N  Managing your Medications? N  Managing your Finances? N  Housekeeping or managing your Housekeeping? N  Some recent data might be hidden    Patient Care Team: Janora Norlander, DO as PCP - General (Family Medicine) Bensimhon, Shaune Pascal, MD (Cardiology) Gaynelle Arabian, MD as Consulting Physician (Orthopedic Surgery) Lavera Guise, [facility] (Pharmacist)  Indicate any recent Medical Services you may have received from other than [facility] providers in the past year (date may be approximate).     Assessment:   This is a routine wellness examination for Sara [name].  Hearing/Vision screen Hearing Screening - Comments:: Hearing issues.  Vision Screening - Comments:: Glasses. Dr. Prudencio Burly. 06/23/2021  Dietary issues and exercise activities discussed: Current Exercise Habits: Home exercise routine, Type of exercise: walking, Time (Minutes): 10, Frequency (Times/Week): 2, Weekly Exercise (Minutes/Week): 20, Intensity: Mild, Exercise limited by: cardiac condition(s);orthopedic condition(s)   Goals Addressed             This Visit's Progress    Exercise 3x per week (30 min per time)       Increase exercise as tolerated.        Depression Screen PHQ 2/9 Scores 07/05/2021 02/24/2021 08/23/2020 02/03/2019 01/28/2019 08/07/2018 04/08/2018  PHQ - 2 Score 0 2 0 0 0 0 4  PHQ- 9 Score - 8 0 - - 5 13    Fall Risk Fall  Risk  07/05/2021 02/24/2021 08/23/2020 02/03/2019 01/28/2019  Falls in the past year? 0 0 0 0 0  Number falls in past yr: 0 - - - -  Injury with Fall? 0 - - - -  Risk for fall due to : No Fall Risks - - - -  Follow up Falls prevention discussed - - - -    FALL RISK PREVENTION PERTAINING TO THE HOME:  Any stairs in or around the home? Yes  If so, are there any without handrails? No  Home free of loose throw rugs in walkways, pet beds, electrical cords, etc? Yes  Adequate lighting in your home to reduce risk of falls? Yes   ASSISTIVE DEVICES UTILIZED TO PREVENT FALLS:  Life alert? No  Use of a cane, walker or w/c? Yes  Grab bars in the bathroom? Yes  Shower chair or bench in shower? Yes  Elevated toilet seat or a handicapped toilet? No   TIMED UP AND GO:  Was the test performed? No .  Phone visit.   Cognitive Function: MMSE - Mini Mental State Exam 05/21/2017  Orientation to time 5  Orientation to Place 5  Registration 3  Attention/ Calculation 5  Recall 3  Language- name 2 objects 2  Language- repeat 1  Language- follow 3 step command 3  Language- read & follow direction 1  Write a sentence 1  Copy design 1  Total score 30     6CIT Screen 07/05/2021  What Year? 0 points  What month? 0 points  What time? 0 points  Count back from 20 0 points  Months in reverse 0 points  Repeat phrase 0 points  Total Score 0    Immunizations Immunization History  Administered Date(s) Administered   Fluad Quad(high Dose 65+) 05/04/2019, 05/25/2020, 06/13/2021   Influenza Whole 04/22/2009   Influenza, High Dose Seasonal PF 05/01/2018   Influenza,inj,Quad PF,6+ Mos 04/25/2015   Influenza-Unspecified 05/18/2014, 07/05/2016, 05/13/2017   Moderna Sars-Covid-2 Vaccination 10/12/2019, 11/09/2019, 06/01/2020, 01/04/2021   Pneumococcal Conjugate-13 06/15/2014   Pneumococcal Polysaccharide-23 04/22/2008, 05/04/2019   Tdap 04/22/2013, 01/11/2021   Zoster Recombinat (Shingrix) 02/24/2021,  04/26/2021   Zoster, Live 08/03/2010    TDAP status: Up to date  Flu Vaccine status: Up to date  Pneumococcal vaccine status: Up to date  Covid-19 vaccine status: Completed vaccines  Qualifies for Shingles Vaccine? Yes   Zostavax completed Yes   Shingrix Completed?: Yes  Screening Tests Health Maintenance  Topic Date Due   OPHTHALMOLOGY EXAM  Never done   COVID-19 Vaccine (5 - Booster for Moderna series) 03/01/2021   FOOT EXAM  08/23/2021   HEMOGLOBIN A1C  08/27/2021   DEXA SCAN  04/07/2022   TETANUS/TDAP  01/12/2031   Pneumonia Vaccine 61+ Years old  Completed   INFLUENZA VACCINE  Completed   Hepatitis C Screening  Completed   Zoster Vaccines- Shingrix  Completed   HPV VACCINES  Aged Out   COLONOSCOPY (Pts 45-52yrs Insurance coverage will need to be confirmed)  Discontinued    Health Maintenance  Health Maintenance Due  Topic Date Due   OPHTHALMOLOGY EXAM  Never done   COVID-19 Vaccine (5 - Booster for Moderna series) 03/01/2021    Colorectal cancer screening: Type of screening: Colonoscopy. Completed 03/10/2021. Repeat every REPEAT NO LONGER REQUIRED years  Mammogram status: Completed 06/01/2021. Repeat every year  Bone Density status: Completed 04/07/2020. Results reflect: Bone density results: OSTEOPOROSIS. Repeat every 2 years.  Lung Cancer Screening: (Low Dose CT Chest recommended if Age 8-80 years, 30 pack-year currently smoking OR have quit w/in 15years.) does not qualify.  NON SMOKER.  Additional Screening:  Hepatitis C Screening: does qualify; Completed 12/07/2015  Vision Screening: Recommended annual ophthalmology exams for early detection of glaucoma and other disorders of the eye. Is the patient up to date with their annual eye exam?  Yes  Who is the provider or what is the name of the office in which the patient attends annual eye exams? Dr. Prudencio Burly in [place] If pt is not established with a provider, would they like to be referred to a provider  to establish care? No .   Dental Screening: Recommended annual dental exams for proper oral hygiene  Community Resource Referral / Chronic Care Management: CRR required this visit?  No   CCM required this visit?  No      Plan:     I have personally reviewed and noted the following in the patients chart:   Medical and social history Use of alcohol, tobacco or illicit drugs  Current medications and supplements including opioid prescriptions.  Functional ability and status Nutritional status Physical activity Advanced directives List of other physicians Hospitalizations, surgeries, and ER visits in previous 12 months Vitals Screenings to include cognitive, depression, and falls Referrals and appointments  In addition, I have reviewed and discussed with patient certain preventive protocols, quality  metrics, and best practice recommendations. A written personalized care plan for preventive services as well as general preventive health recommendations were provided to patient.     Chriss Driver, LPN   [id number]   Nurse Notes: PHONE VISIT. PT AT HOME. NURSE AT [facility]. Pt up to date on all health maintenance and vaccines. Pt c/o issues with hearing loss and would like referral for hearing test. Referral placed to ENT, pt agreeable.

## 2021-07-19 ENCOUNTER — Telehealth: Payer: Self-pay | Admitting: Family Medicine

## 2021-07-19 NOTE — Telephone Encounter (Signed)
Patient aware and verbalized understanding. °

## 2021-07-19 NOTE — Telephone Encounter (Signed)
What do I need to print for her or do you have something in your office?

## 2021-07-19 NOTE — Telephone Encounter (Signed)
Pt called stating that Sara [name] has her taking a shot for her cholesterol and says she was given a discount form that was printed out for her to help lower the cost of it with her insurance and says she has lost the paper and needs new one.  Please advise and call patient.

## 2021-07-19 NOTE — Telephone Encounter (Signed)
Please let patient know  New card left up front for patient to pick up

## 2021-07-23 ENCOUNTER — Other Ambulatory Visit: Payer: Self-pay | Admitting: Family Medicine

## 2021-07-23 DIAGNOSIS — E1159 Type 2 diabetes mellitus with other circulatory complications: Secondary | ICD-10-CM

## 2021-07-25 ENCOUNTER — Telehealth: Payer: Medicare HMO

## 2021-08-09 ENCOUNTER — Telehealth: Payer: Medicare HMO

## 2021-08-16 ENCOUNTER — Other Ambulatory Visit: Payer: Self-pay | Admitting: Family Medicine

## 2021-08-16 DIAGNOSIS — E1159 Type 2 diabetes mellitus with other circulatory complications: Secondary | ICD-10-CM

## 2021-08-16 NOTE — Telephone Encounter (Signed)
30 days given ntbs  

## 2021-08-17 NOTE — Telephone Encounter (Signed)
Pt made appt for 08/2721

## 2021-08-19 ENCOUNTER — Other Ambulatory Visit: Payer: Self-pay | Admitting: Family Medicine

## 2021-08-19 DIAGNOSIS — E785 Hyperlipidemia, unspecified: Secondary | ICD-10-CM

## 2021-08-19 DIAGNOSIS — E1169 Type 2 diabetes mellitus with other specified complication: Secondary | ICD-10-CM

## 2021-08-24 NOTE — Progress Notes (Signed)
Received notification from [employer] regarding approval for SYMBICORT 180. Patient assistance approved from 07/06/21 to 07/22/22.  SHIPMENT DELIVERED 08/08/21  Phone: [phone]

## 2021-08-31 ENCOUNTER — Other Ambulatory Visit: Payer: Self-pay | Admitting: Family Medicine

## 2021-08-31 ENCOUNTER — Ambulatory Visit (INDEPENDENT_AMBULATORY_CARE_PROVIDER_SITE_OTHER): Payer: Medicare HMO | Admitting: Pharmacist

## 2021-08-31 DIAGNOSIS — I152 Hypertension secondary to endocrine disorders: Secondary | ICD-10-CM

## 2021-08-31 DIAGNOSIS — E782 Mixed hyperlipidemia: Secondary | ICD-10-CM

## 2021-08-31 NOTE — Progress Notes (Signed)
Patient has stopped pills

## 2021-08-31 NOTE — Patient Instructions (Signed)
Visit Information  Following are the goals we discussed today:  Current Barriers:  Unable to achieve control of CHOLESTEROL & T2DM/BLOOD SUGAR   Pharmacist Clinical Goal(s):  patient will verbalize ability to afford treatment regimen achieve control of CHOLESTEROL as evidenced by  through collaboration with PharmD and provider.   Interventions: 1:1 collaboration with Janora Norlander, DO regarding development and update of comprehensive plan of care as evidenced by provider attestation and co-signature Inter-disciplinary care team collaboration (see longitudinal plan of care) Comprehensive medication review performed; medication list updated in electronic medical record  HYPERLIPIDEMIA Uncontrolled; current treatment: ATORVASTATIN 80MG  TWICE WEEKLY (patient has stopped due to side effects-asked her to discuss with cardiologist); PRALUENT Q14 DAYS Intolerances --> rosuvastatin Significant cardiac history; follows with cardiology who also recommended PCSK9 Repatha not covered on insurace  Called in [place] but over >$100/month (COVERED ON INSURANCE UNTIL 06/2022), HOWEVER patient would enter coverage gap CONTINUE PRALUENT Tolerating well [place]! $0 copay using grant foundation card for 1 year total CONTINUE Atorvastatin-patient has stopped, needs to discuss with cards HAVING MYOPATHY--DECREASE TO TWICE WEEKLY NOW ON PRALUENT Recheck lipids at PCP f/u next month  Lipid Panel     Component Value Date/Time   CHOL 179 02/24/2021 0901   TRIG 75 02/24/2021 0901   HDL 51 02/24/2021 0901   CHOLHDL 3.5 02/24/2021 0901   CHOLHDL 3.0 07/11/2016 1507   VLDL 13 07/11/2016 1507   LDLCALC 114 (H) 02/24/2021 0901   LABVLDL 14 02/24/2021 0901   Recommended praluent (patient can't afford), will keep in nexletol samples until we have grant reopen, can also try zetia next Assessed patient finances. APPLICATION FOR [employer] GRANT  APPROVED --> INSTRUCTED PATIENT TO TAKE TO PHARMACY AND THEN BRING TO ME FOR INJECTION TECHNIQUE/ADDITIONAL EDUCATION; GRANT RUNS OUT 06/2022  -Discussed with patient that praluent can help reduce her cardiac risk significantly while bringing LDL to goal -Control of T2DM/eating healthier diet will be very beneficial as well -->discussed lifestyle changes   Patient Goals/Self-Care Activities patient will:  - take medications as prescribed collaborate with provider on medication access solutions  Follow Up Plan: Telephone follow up appointment with care management team member scheduled for: 1 MONTH   Plan: Telephone follow up appointment with care management team member scheduled for:  09/2021  Signature Sara [name], PharmD, BCPS Clinical Pharmacist, [place]  II Phone [phone]   Please call the care guide team at [phone] if you need to cancel or reschedule your appointment.   Patient verbalizes understanding of instructions and care plan provided today and agrees to view in [place]. Active MyChart status confirmed with patient.

## 2021-08-31 NOTE — Progress Notes (Signed)
Chronic Care Management Pharmacy Note  08/31/2021 Name:  Wyonia Fontanella MRN:  [id number] DOB:  1946-01-04  Summary: HLD  Recommendations/Changes made from today's visit:  HYPERLIPIDEMIA Uncontrolled; current treatment: ATORVASTATIN 80MG TWICE WEEKLY (patient has stopped due to side effects-asked her to discuss with cardiologist); PRALUENT Q14 DAYS Intolerances --> rosuvastatin Significant cardiac history; follows with cardiology who also recommended PCSK9 Repatha not covered on insurace  Called in [place] but over >$100/month (COVERED ON INSURANCE UNTIL 06/2022), HOWEVER patient would enter coverage gap CONTINUE PRALUENT Tolerating well [place]! $0 copay using grant foundation card for 1 year total CONTINUE Atorvastatin-patient has stopped, needs to discuss with cards HAVING MYOPATHY--DECREASE TO TWICE WEEKLY NOW ON PRALUENT Recheck lipids at PCP f/u next month   Subjective: Claudine Edan Serratore is an 76 y.o. year old female who is a primary patient of Janora Norlander, DO.  The CCM team was consulted for assistance with disease management and care coordination needs.    Engaged with patient by telephone for follow up visit in response to provider referral for pharmacy case management and/or care coordination services.   Consent to Services:  The patient was given information about Chronic Care Management services, agreed to services, and gave verbal consent prior to initiation of services.  Please see initial visit note for detailed documentation.   Patient Care Team: Janora Norlander, DO as PCP - General (Family Medicine) Bensimhon, Shaune Pascal, MD (Cardiology) Gaynelle Arabian, MD as Consulting Physician (Orthopedic Surgery) Lavera Guise, [facility] (Pharmacist)  Objective:  Lab Results  Component Value Date   CREATININE 0.63 01/20/2021   CREATININE 0.75 08/23/2020   CREATININE 0.85 12/11/2019    Lab Results   Component Value Date   HGBA1C 6.5 02/24/2021   Last diabetic Eye exam:  Lab Results  Component Value Date/Time   HMDIABEYEEXA No Retinopathy 06/12/2021 12:00 AM    Last diabetic Foot exam: No results found for: HMDIABFOOTEX      Component Value Date/Time   CHOL 179 02/24/2021 0901   TRIG 75 02/24/2021 0901   HDL 51 02/24/2021 0901   CHOLHDL 3.5 02/24/2021 0901   CHOLHDL 3.0 07/11/2016 1507   VLDL 13 07/11/2016 1507   LDLCALC 114 (H) 02/24/2021 0901    Hepatic Function Latest Ref Rng & Units 01/20/2021 08/23/2020 12/11/2019  Total Protein 6.5 - 8.1 g/dL 6.5 6.1 5.9(L)  Albumin 3.5 - 5.0 g/dL 3.5 3.7 3.7  AST 15 - 41 U/L '29 16 21  ' ALT 0 - 44 U/L '20 12 12  ' Alk Phosphatase 38 - 126 U/L 54 72 74  Total Bilirubin 0.3 - 1.2 mg/dL 0.9 1.1 0.9  Bilirubin, Direct 0.0 - 0.3 mg/dL - - -    Lab Results  Component Value Date/Time   TSH 1.370 12/11/2019 01:53 PM   TSH 1.910 02/02/2019 10:15 AM    CBC Latest Ref Rng & Units 01/20/2021 02/02/2019 04/08/2018  WBC 4.0 - 10.5 K/uL 7.7 5.6 5.1  Hemoglobin 12.0 - 15.0 g/dL 13.8 14.6 14.1  Hematocrit 36.0 - 46.0 % 40.4 42.1 42.5  Platelets 150 - 400 K/uL 189 159 181    Lab Results  Component Value Date/Time   VD25OH 37.7 02/24/2021 09:01 AM   VD25OH 42.2 04/15/2020 03:31 PM    Clinical ASCVD: Yes  The ASCVD Risk score (Arnett DK, et al., 2019) failed to calculate for the following reasons:   The patient has a prior MI or stroke diagnosis  Other: (CHADS2VASc if Afib, PHQ9 if depression, MMRC or CAT for COPD, ACT, DEXA)  Social History   Tobacco Use  Smoking Status Never  Smokeless Tobacco Never   BP Readings from Last 3 Encounters:  03/10/21 (!) 94/59  03/08/21 120/68  02/24/21 116/79   Pulse Readings from Last 3 Encounters:  03/10/21 68  03/08/21 75  02/24/21 79   Wt Readings from Last 3 Encounters:  07/05/21 253 lb (114.8 kg)  03/10/21 253 lb (114.8 kg)  03/08/21 253 lb (114.8 kg)    Assessment: Review of patient  past medical history, allergies, medications, health status, including review of consultants reports, laboratory and other test data, was performed as part of comprehensive evaluation and provision of chronic care management services.   SDOH:  (Social Determinants of Health) assessments and interventions performed:    CCM Care Plan  Allergies  Allergen Reactions   Latex Other (See Comments)   Nifedipine Other (See Comments)    Brings blood pressure up really fast PROCARDIA   Tape     bleeding   Codeine Itching   Crestor [Rosuvastatin] Other (See Comments)    myalgias    Medications Reviewed Today     Reviewed by Lavera Guise, [facility] (Pharmacist) on 08/31/21 at 1403  Med List Status: <None>   Medication Order Taking? Sig Documenting Provider Last Dose Status Informant  albuterol (VENTOLIN HFA) 108 (90 Base) MCG/ACT inhaler [id number]  TAKE 2 PUFFS BY MOUTH EVERY 6 HOURS AS NEEDED FOR WHEEZE OR SHORTNESS OF BREATH  Patient taking differently: Inhale 2 puffs into the lungs every 6 (six) hours as needed for wheezing or shortness of breath.   Ronnie Doss M, DO  Active   alendronate (FOSAMAX) 70 MG tablet [id number]  TAKE 1 TABLET EVERY 7 DAYS WITH A FULL GLASS OF Virgel Paling ON AN EMPTY STOMACH Ronnie Doss M, DO  Active            Med Note Blanca Friend, Arkel Cartwright D   Tue Apr 11, 2021  4:15 PM) Started on 04/19/20  Alirocumab (PRALUENT) 75 MG/ML SOAJ [id number]  Inject 75 mg into the skin every 14 (fourteen) days. Janora Norlander, DO  Active            Med Note Leisa Lenz Jun 07, 2021  3:55 PM) VIA [employer] GRANT  ALPRAZolam Duanne Moron) 0.5 MG tablet [id number]  TAKE 1 TABLET BY MOUTH TWICE A DAY AS NEEDED FOR ANXIETY Hawks, Christy A, FNP  Active   amoxicillin (AMOXIL) 500 MG capsule [id number]  Take 1 capsule (500 mg total) by mouth 3 (three) times daily. Claretta Fraise, MD  Active   aspirin EC 81 MG tablet [id number]  Take 81 mg by mouth daily. [provider]   Active Self  atorvastatin (LIPITOR) 80 MG tablet [id number] No TAKE 1 TABLET (80 MG TOTAL) BY MOUTH EVERY OTHER DAY.  Patient not taking: Reported on 08/31/2021   Janora Norlander, DO Not Taking Active   betamethasone valerate ointment (VALISONE) 0.1 % [id number]  Apply 1 application topically 2 (two) times daily as needed (psoriasis). (DO NOT apply to face, groin or axilla) Janora Norlander, DO  Active   budesonide-formoterol (SYMBICORT) 80-4.5 MCG/ACT inhaler [id number]  Inhale 2 puffs into the lungs 2 (two) times daily. Janora Norlander, DO  Active Self           Med Note Parthenia Ames Aug 31, 2021  2:03 PM) Via [place]  patient assistance program   Calcium Carbonate-Vitamin D (SUPER CALCIUM 600 + D3 PO) [id number]  Take 1 tablet by mouth daily. [provider]  Active Self  cyclobenzaprine (FLEXERIL) 5 MG tablet [id number]  TAKE 1 TABLET BY MOUTH THREE TIMES A DAY AS NEEDED FOR MUSCLE SPASMS Hawks, Christy A, FNP  Active   diltiazem (CARDIZEM CD) 120 MG 24 hr capsule [id number]  Take 1 capsule (120 mg total) by mouth daily. Patient needs office visit for further refills Ronnie Doss M, DO  Active   fluticasone [facility]) 50 MCG/ACT nasal spray [id number]  SPRAY 1 SPRAY INTO EACH NOSTRIL TWICE A DAY Ronnie Doss M, DO  Active Self  furosemide (LASIX) 20 MG tablet [id number]  TAKE 1 TABLET BY MOUTH EVERY DAY Ronnie Doss M, DO  Active   losartan (COZAAR) 25 MG tablet [id number]  TAKE 1 TABLET BY MOUTH TWICE A DAY Gottschalk, Ashly M, DO  Active   meclizine (ANTIVERT) 25 MG tablet [id number]  Take 1 tablet (25 mg total) by mouth 2 (two) times daily as needed for dizziness. Reported on 12/05/2015 Sharion Balloon, FNP  Active Self  nitrofurantoin, macrocrystal-monohydrate, (MACROBID) 100 MG capsule [id number]  Take 1 capsule (100 mg total) by mouth 2 (two) times daily. Claretta Fraise, MD  Active   nitroGLYCERIN (NITROSTAT) 0.4 MG SL tablet [id number]  Place 1 tablet (0.4 mg  total) under the tongue every 5 (five) minutes as needed for chest pain (x 3 doses). Reported on 12/05/2015 Janora Norlander, DO  Active   omeprazole (PRILOSEC) 20 MG capsule [id number]  TAKE 1 CAPSULE BY MOUTH EVERY DAY Ronnie Doss M, DO  Active   potassium chloride (KLOR-CON) 10 MEQ tablet [id number]  Take 1 tablet (10 mEq total) by mouth daily. Needs office visit for further refills Halford Chessman  Active   [facility] injection [id number]   [provider]  Active   spironolactone (ALDACTONE) 25 MG tablet [id number]  Take 0.5 tablets (12.5 mg total) by mouth 2 (two) times daily. Janora Norlander, DO  Active             Patient Active Problem List   Diagnosis Date Noted   New onset type 2 diabetes mellitus ([place]) 08/23/2020   COVID-19 virus infection 07/08/2019   Hyperlipidemia associated with type 2 diabetes mellitus ([place]) 08/27/2018   Morbid obesity ([place]) 08/27/2018   Chronic left-sided low back pain with left-sided sciatica 05/10/2017   Coronary artery disease involving native coronary artery of native heart without angina pectoris 03/20/2017   LBBB (left bundle branch block) 03/20/2017   Age related osteoporosis 10/17/2016   Acute rhinosinusitis 10/17/2016   Primary osteoarthritis of right hip 02/23/2016   Hypokalemia 01/20/2015   OA (osteoarthritis) of knee 12/22/2012   AVNRT (AV nodal re-entry tachycardia) ([place])    Atrial arrhythmia 07/03/2011   OBSTRUCTIVE SLEEP APNEA 03/15/2010   EDEMA 02/28/2009   Hyperlipidemia 12/09/2008   Hypertension associated with diabetes ([place]) 12/09/2008   Coronary atherosclerosis 12/09/2008   HEMORRHOIDS 12/09/2008   GERD 12/09/2008   PSORIASIS 12/09/2008   Arthritis 12/09/2008   VERTIGO 12/09/2008   CARDIAC MURMUR 12/09/2008    Immunization History  Administered Date(s) Administered   Fluad Quad(high Dose 65+) 05/04/2019, 05/25/2020, 06/13/2021   Influenza Whole 04/22/2009   Influenza, High Dose Seasonal PF  05/01/2018   Influenza,inj,Quad PF,6+ Mos 04/25/2015   Influenza-Unspecified 05/18/2014, 07/05/2016, 05/13/2017   Moderna Sars-Covid-2 Vaccination 10/12/2019, 11/09/2019, 06/01/2020, 01/04/2021   Pneumococcal Conjugate-13 06/15/2014   Pneumococcal  Polysaccharide-23 04/22/2008, 05/04/2019   Tdap 04/22/2013, 01/11/2021   Zoster Recombinat (Shingrix) 02/24/2021, 04/26/2021   Zoster, Live 08/03/2010    Conditions to be addressed/monitored: HLD  Care Plan : PHARMD MEDICATION MANAGEMENT  Updates made by Lavera Guise, RPH since 08/31/2021 12:00 AM     Problem: DIESEASE PROGRESSION PREVENTION      Long-Range Goal: HYPERLIPIDEMIA, T2DM   Recent Progress: Not on track  Priority: High  Note:   Current Barriers:  Unable to achieve control of CHOLESTEROL & T2DM/BLOOD SUGAR   Pharmacist Clinical Goal(s):  patient will verbalize ability to afford treatment regimen achieve control of CHOLESTEROL as evidenced by    through collaboration with PharmD and provider.   Interventions: 1:1 collaboration with Janora Norlander, DO regarding development and update of comprehensive plan of care as evidenced by provider attestation and co-signature Inter-disciplinary care team collaboration (see longitudinal plan of care) Comprehensive medication review performed; medication list updated in electronic medical record  HYPERLIPIDEMIA Uncontrolled; current treatment: ATORVASTATIN 80MG TWICE WEEKLY (patient has stopped due to side effects-asked her to discuss with cardiologist); PRALUENT Q14 DAYS Intolerances --> rosuvastatin Significant cardiac history; follows with cardiology who also recommended PCSK9 Repatha not covered on insurace  Called in [place] but over >$100/month (COVERED ON INSURANCE UNTIL 06/2022), HOWEVER patient would enter coverage gap CONTINUE PRALUENT Tolerating well [place]! $0 copay using grant foundation card for 1 year  total CONTINUE Atorvastatin-patient has stopped, needs to discuss with cards HAVING MYOPATHY--DECREASE TO TWICE WEEKLY NOW ON PRALUENT Recheck lipids at PCP f/u next month  Lipid Panel     Component Value Date/Time   CHOL 179 02/24/2021 0901   TRIG 75 02/24/2021 0901   HDL 51 02/24/2021 0901   CHOLHDL 3.5 02/24/2021 0901   CHOLHDL 3.0 07/11/2016 1507   VLDL 13 07/11/2016 1507   LDLCALC 114 (H) 02/24/2021 0901   LABVLDL 14 02/24/2021 0901  Recommended praluent (patient can't afford), will keep in nexletol samples until we have grant reopen, can also try zetia next Assessed patient finances. APPLICATION FOR [employer] GRANT APPROVED --> INSTRUCTED PATIENT TO TAKE TO PHARMACY AND THEN BRING TO ME FOR INJECTION TECHNIQUE/ADDITIONAL EDUCATION; GRANT RUNS OUT 06/2022  -Discussed with patient that praluent can help reduce her cardiac risk significantly while bringing LDL to goal -Control of T2DM/eating healthier diet will be very beneficial as well -->discussed lifestyle changes   Patient Goals/Self-Care Activities patient will:  - take medications as prescribed collaborate with provider on medication access solutions  Follow Up Plan: Telephone follow up appointment with care management team member scheduled for: 1 MONTH      Medication Assistance:  [place]; [place] FOR SYMBICORT   Patient's preferred pharmacy is:  [id number] [address]Phone: [phone]: [phone] Follow Up:  Patient agrees to Care Plan and Follow-up.  Plan: Telephone follow up appointment with care management team member scheduled for:  1 MONTH  JRegina Eck PharmD, BCPS Clinical Pharmacist, [place] Phone [phone]

## 2021-09-04 ENCOUNTER — Other Ambulatory Visit: Payer: Self-pay | Admitting: Nurse Practitioner

## 2021-09-07 ENCOUNTER — Other Ambulatory Visit: Payer: Self-pay | Admitting: Family Medicine

## 2021-09-07 DIAGNOSIS — J019 Acute sinusitis, unspecified: Secondary | ICD-10-CM

## 2021-09-12 ENCOUNTER — Other Ambulatory Visit: Payer: Self-pay | Admitting: Family Medicine

## 2021-09-12 DIAGNOSIS — E1159 Type 2 diabetes mellitus with other circulatory complications: Secondary | ICD-10-CM

## 2021-09-12 DIAGNOSIS — I152 Hypertension secondary to endocrine disorders: Secondary | ICD-10-CM

## 2021-09-15 ENCOUNTER — Encounter: Payer: Self-pay | Admitting: Internal Medicine

## 2021-09-15 ENCOUNTER — Ambulatory Visit: Payer: Medicare HMO | Admitting: Internal Medicine

## 2021-09-15 VITALS — BP 128/80 | HR 62 | Ht 65.0 in | Wt 261.2 lb

## 2021-09-15 DIAGNOSIS — K649 Unspecified hemorrhoids: Secondary | ICD-10-CM | POA: Diagnosis not present

## 2021-09-15 DIAGNOSIS — R198 Other specified symptoms and signs involving the digestive system and abdomen: Secondary | ICD-10-CM

## 2021-09-15 NOTE — Patient Instructions (Signed)
If you are age 77 or older, your body mass index should be between 23-30. Your Body mass index is 43.47 kg/m. If this is out of the aforementioned range listed, please consider follow up with your Primary Care Provider.  If you are age 78 or younger, your body mass index should be between 19-25. Your Body mass index is 43.47 kg/m. If this is out of the aformentioned range listed, please consider follow up with your Primary Care Provider.   ________________________________________________________  The [place] [facility] providers would like to encourage you to use [facility] to communicate with providers for non-urgent requests or questions.  Due to long hold times on the telephone, sending your provider a message by [facility] may be a faster and more efficient way to get a response.  Please allow 48 business hours for a response.  Please remember that this is for non-urgent requests.  _______________________________________________________  As discussed with Dr. Carlean Purl eliminate sugars and reduce carbs.  Take one tablespoon of Benefiber in water or juice daily and increase to two if necessary.  Follow up as needed

## 2021-09-15 NOTE — Progress Notes (Signed)
Darling Cieslewicz TKPTW 76 y.o. Mar 16, 1946 [id number]  Assessment & Plan:   Encounter Diagnoses  Name Primary?   Change in bowel movement Yes   Bleeding hemorrhoids    She inquired about banding of hemorrhoids I do not think that that would alleviate all of her problems.  My plan is for her to do the following at this time:  1 to 2 tablespoons of soluble fiber with Benefiber daily  For overall health try to eliminate sugar and then try to eliminate diet soda  Continue to reduce carbohydrates such as bread and grains and have more soluble fiber in her diet  Return if this is not successful and could reconsider hemorrhoidal banding depending upon clinical course.    I also plan to call her and go over artificial sweeteners as a cause of loose stools, we touched on the diet soda but I do not think I made that point.  .  CCJanora Norlander, DO  Subjective:   Chief Complaint: Loose stools difficulty wiping hemorrhoids  HPI 76 year old white woman status post screening colonoscopy in August 2022 (diverticulosis, external and internal hemorrhoids) with complaints of softer stools and difficulty wiping at times.  This started sometime after her colonoscopy but her stools are a bit softer and sometimes loose.  On 3 occasions she has felt like she has had to wipe endlessly in order to get clean.  She has known hemorrhoids from her colonoscopy and sometimes she sees some bright red blood per rectum she treats that with over-the-counter hemorrhoidal suppositories like Preparation H.  There is been no change in that necessarily.  She has had no medication or diet changes though she is trying to reduce sugars and bread.  She does not drink sodas but she drinks "half-and-half tea".  Also drinks diet Pepsi.  That is not necessarily a new thing however. Allergies  Allergen Reactions   Latex Other (See Comments)   Nifedipine Other (See Comments)    Brings blood pressure up really fast  PROCARDIA   Tape     bleeding   Codeine Itching   Crestor [Rosuvastatin] Other (See Comments)    myalgias   Current Meds  Medication Sig   albuterol (VENTOLIN HFA) 108 (90 Base) MCG/ACT inhaler TAKE 2 PUFFS BY MOUTH EVERY 6 HOURS AS NEEDED FOR WHEEZE OR SHORTNESS OF BREATH (Patient taking differently: Inhale 2 puffs into the lungs every 6 (six) hours as needed for wheezing or shortness of breath.)   alendronate (FOSAMAX) 70 MG tablet TAKE 1 TABLET EVERY 7 DAYS WITH A FULL GLASS OF WTER ON AN EMPTY STOMACH   Alirocumab (PRALUENT) 75 MG/ML SOAJ Inject 75 mg into the skin every 14 (fourteen) days.   ALPRAZolam (XANAX) 0.5 MG tablet TAKE 1 TABLET BY MOUTH TWICE A DAY AS NEEDED FOR ANXIETY   amoxicillin (AMOXIL) 500 MG capsule Take 1 capsule (500 mg total) by mouth 3 (three) times daily.   aspirin EC 81 MG tablet Take 81 mg by mouth daily.   budesonide-formoterol (SYMBICORT) 80-4.5 MCG/ACT inhaler Inhale 2 puffs into the lungs 2 (two) times daily.   Calcium Carbonate-Vitamin D (SUPER CALCIUM 600 + D3 PO) Take 1 tablet by mouth daily.   diltiazem (CARDIZEM CD) 120 MG 24 hr capsule TAKE 1 CAPSULE (120 MG TOTAL) BY MOUTH DAILY. PATIENT NEEDS OFFICE VISIT FOR FURTHER REFILLS   fluticasone (FLONASE) 50 MCG/ACT nasal spray USE 1 SPRAY INTO EACH NOSTRIL TWICE A DAY   furosemide (LASIX) 20 MG tablet  TAKE 1 TABLET BY MOUTH EVERY DAY   losartan (COZAAR) 25 MG tablet TAKE 1 TABLET BY MOUTH TWICE A DAY   meclizine (ANTIVERT) 25 MG tablet Take 1 tablet (25 mg total) by mouth 2 (two) times daily as needed for dizziness. Reported on 12/05/2015   nitrofurantoin, macrocrystal-monohydrate, (MACROBID) 100 MG capsule Take 1 capsule (100 mg total) by mouth 2 (two) times daily.   nitroGLYCERIN (NITROSTAT) 0.4 MG SL tablet Place 1 tablet (0.4 mg total) under the tongue every 5 (five) minutes as needed for chest pain (x 3 doses). Reported on 12/05/2015   omeprazole (PRILOSEC) 20 MG capsule TAKE 1 CAPSULE BY MOUTH EVERY DAY    potassium chloride (KLOR-CON) 10 MEQ tablet TAKE 1 TABLET (10 MEQ TOTAL) BY MOUTH DAILY. NEEDS OFFICE VISIT FOR FURTHER REFILLS   spironolactone (ALDACTONE) 25 MG tablet Take 0.5 tablets (12.5 mg total) by mouth 2 (two) times daily.   Past Medical History:  Diagnosis Date   Arthritis    Arthritis -hands -Bil. knee replacemnts   AVNRT (AV nodal re-entry tachycardia) ([place])    s/p RFCA 09/2011   Bronchitis    none recent   CAD (coronary artery disease)    a. s/p Xience DES x 3 to LAD;  b. nuc study 02/27/10: EF 67% no ischemia;  c.  echo 11/12: Mild LVH, EF 68-34%, grade 1 diastolic dysfunction, moderate LAE;  d.  LHC 07/03/11: LAD stents patent, RCA 25%, EF 55-65% ;  e. [place]. Myoview 5/14:  No ischemia, EF 68%   COVID-19 2021   Depression    Diverticular disease    GERD (gastroesophageal reflux disease)    Heart murmur    Hemorrhoid    Hepatitis    yellow jaundice as a child    HTN (hypertension)    Hx of echocardiogram    a.  Echo (05/2011):  Mild LVH, EF 55-60%, Gr 1 DD, mod LAE.b. Echo (11/14):  Mild LVH, mild focal basal septal hypertrophy, EF 55-60%, Gr 1 DD, mild LAE, atrial septal aneurysm   Hyperlipidemia    Hypertension    Impaired hearing    bilateral- no hearing aids   Jaundice    Myocardial infarction (HCC)    hx of x 2 3-4 years ago per pt   Obesity    OSA (obstructive sleep apnea)    needs no cpap per patient pt denies   Other psoriasis    ongoing- none at present.   Sleep apnea    Vertigo    Past Surgical History:  Procedure Laterality Date   ABDOMINAL HYSTERECTOMY     BACK SURGERY     Dr. Lawernce Pitts   CARDIAC CATHETERIZATION  08/23/2011   Ablation AV  Node   CATARACT EXTRACTION Bilateral    COLONOSCOPY     CORONARY ANGIOPLASTY     with stents 2009    KNEE SURGERY     right   LEFT AND RIGHT HEART CATHETERIZATION WITH CORONARY ANGIOGRAM N/A 08/18/2013   Procedure: LEFT AND RIGHT HEART CATHETERIZATION WITH CORONARY ANGIOGRAM;  Surgeon: Larey Dresser, MD;   Location[facility] CATH LAB;  Service: Cardiovascular;  Laterality: N/A;   LEFT HEART CATHETERIZATION WITH CORONARY ANGIOGRAM N/A 07/03/2011   Procedure: LEFT HEART CATHETERIZATION WITH CORONARY ANGIOGRAM;  Surgeon: Minus Breeding, MD;  Location: [facility] CATH LAB;  Service: Cardiovascular;  Laterality: N/A;   REPAIR RECTOCELE     SUPRAVENTRICULAR TACHYCARDIA ABLATION N/A 08/23/2011   Procedure: SUPRAVENTRICULAR TACHYCARDIA ABLATION;  Surgeon: Evans Lance, MD;  Location: [place] CATH LAB;  Service: Cardiovascular;  Laterality: N/A;   TOTAL HIP ARTHROPLASTY Right 02/23/2016   Procedure: RIGHT TOTAL HIP ARTHROPLASTY ANTERIOR APPROACH;  Surgeon: Rod Can, MD;  Location: [facility] ORS;  Service: Orthopedics;  Laterality: Right;   TOTAL KNEE ARTHROPLASTY Left 12/22/2012   Procedure: LEFT TOTAL KNEE ARTHROPLASTY;  Surgeon: Gearlean Alf, MD;  Location: [facility] ORS;  Service: Orthopedics;  Laterality: Left;   TUBAL LIGATION     UPPER GASTROINTESTINAL ENDOSCOPY     Social History   Social History Narrative   Lives with husband, Mortimer Fries. Married x 34 years in 10-11-20.   1 son died in 10/11/2017 from [place].    family history includes Aneurysm in her father; Breast cancer in her mother; Heart failure in her mother; Hypertension in her mother; Muscular dystrophy in her daughter; Seizures in her brother and son; Stroke in her mother.   Review of Systems As above  Objective:   Physical Exam BP 128/80    Pulse 62    Ht 5\' 5"  (1.651 m)    Wt 261 lb 3.2 oz (118.5 kg)    SpO2 98%    BMI 43.47 kg/m  Obese white woman no acute distress  Julieanne Cotton, CMA present  Rectal exam reveals some small external hemorrhoids on anoderm inspection, there is some slightly decreased tone on digital exam with some soft brown stool present.  Nontender no mass no sign of fissure.  Anoscopy is performed and shows mildly inflamed internal and external hemorrhoids grade 2.

## 2021-09-18 ENCOUNTER — Ambulatory Visit (INDEPENDENT_AMBULATORY_CARE_PROVIDER_SITE_OTHER): Payer: Medicare HMO | Admitting: Family Medicine

## 2021-09-18 VITALS — BP 124/71 | HR 73 | Temp 97.4°F | Ht 65.0 in | Wt 264.4 lb

## 2021-09-18 DIAGNOSIS — E1169 Type 2 diabetes mellitus with other specified complication: Secondary | ICD-10-CM

## 2021-09-18 DIAGNOSIS — I152 Hypertension secondary to endocrine disorders: Secondary | ICD-10-CM

## 2021-09-18 DIAGNOSIS — E119 Type 2 diabetes mellitus without complications: Secondary | ICD-10-CM

## 2021-09-18 DIAGNOSIS — E785 Hyperlipidemia, unspecified: Secondary | ICD-10-CM | POA: Diagnosis not present

## 2021-09-18 DIAGNOSIS — E1159 Type 2 diabetes mellitus with other circulatory complications: Secondary | ICD-10-CM | POA: Diagnosis not present

## 2021-09-18 LAB — BAYER DCA HB A1C WAIVED: HB A1C (BAYER DCA - WAIVED): 6.3 % — ABNORMAL HIGH (ref 4.8–5.6)

## 2021-09-19 ENCOUNTER — Other Ambulatory Visit: Payer: Medicare HMO

## 2021-09-19 ENCOUNTER — Encounter: Payer: Self-pay | Admitting: Family Medicine

## 2021-09-19 ENCOUNTER — Telehealth: Payer: Self-pay | Admitting: Family Medicine

## 2021-09-19 DIAGNOSIS — E785 Hyperlipidemia, unspecified: Secondary | ICD-10-CM | POA: Diagnosis not present

## 2021-09-19 DIAGNOSIS — E119 Type 2 diabetes mellitus without complications: Secondary | ICD-10-CM | POA: Diagnosis not present

## 2021-09-19 DIAGNOSIS — E1169 Type 2 diabetes mellitus with other specified complication: Secondary | ICD-10-CM | POA: Diagnosis not present

## 2021-09-19 LAB — LIPID PANEL
Chol/HDL Ratio: 2.8 ratio (ref 0.0–4.4)
Cholesterol, Total: 147 mg/dL (ref 100–199)
HDL: 53 mg/dL (ref >39)
LDL Chol Calc (NIH): 83 mg/dL (ref 0–99)
Triglycerides: 52 mg/dL (ref 0–149)
VLDL Cholesterol Cal: 11 mg/dL (ref 5–40)

## 2021-09-19 MED ORDER — OZEMPIC (0.25 OR 0.5 MG/DOSE) 2 MG/1.5ML ~~LOC~~ SOPN: PEN_INJECTOR | SUBCUTANEOUS | 3 refills | Status: DC

## 2021-09-19 NOTE — Progress Notes (Signed)
Subjective: CC:DM PCP: Janora Norlander, DO AOZ:HYQMV Sara [name] is a 76 y.o. female presenting to clinic today for:  1. Type 2 Diabetes with hypertension, hyperlipidemia:  Currently diet-controlled type 2 diabetes.  She has been working on a carb modification efforts to lose weight improve overall health.  Last eye exam: Up-to-date Last foot exam: Needs Last A1c:  Lab Results  Component Value Date   HGBA1C 6.3 (H) 09/18/2021   Nephropathy screen indicated?:  On ARB Last flu, zoster and/or pneumovax:  Immunization History  Administered Date(s) Administered   Fluad Quad(high Dose 65+) 05/04/2019, 05/25/2020, 06/13/2021   Influenza Whole 04/22/2009   Influenza, High Dose Seasonal PF 05/01/2018   Influenza,inj,Quad PF,6+ Mos 04/25/2015   Influenza-Unspecified 05/18/2014, 07/05/2016, 05/13/2017   Moderna Sars-Covid-2 Vaccination 10/12/2019, 11/09/2019, 06/01/2020, 01/04/2021   Pneumococcal Conjugate-13 06/15/2014   Pneumococcal Polysaccharide-23 04/22/2008, 05/04/2019   Tdap 04/22/2013, 01/11/2021   Zoster Recombinat (Shingrix) 02/24/2021, 04/26/2021   Zoster, Live 08/03/2010    ROS: Denies chest pain, shortness of breath, visual disturbance, sensory changes.    ROS: Per HPI  Allergies  Allergen Reactions   Latex Other (See Comments)   Nifedipine Other (See Comments)    Brings blood pressure up really fast PROCARDIA   Tape     bleeding   Codeine Itching   Crestor [Rosuvastatin] Other (See Comments)    myalgias   Past Medical History:  Diagnosis Date   Arthritis    Arthritis -hands -Bil. knee replacemnts   AVNRT (AV nodal re-entry tachycardia) ([place])    s/p RFCA 09/2011   Bronchitis    none recent   CAD (coronary artery disease)    a. s/p Xience DES x 3 to LAD;  b. nuc study 02/27/10: EF 67% no ischemia;  c.  echo 11/12: Mild LVH, EF 78-46%, grade 1 diastolic dysfunction, moderate LAE;  d.  LHC 07/03/11: LAD stents patent, RCA 25%, EF 55-65% ;  e. [place].  Myoview 5/14:  No ischemia, EF 68%   COVID-19 2021   Depression    Diverticular disease    GERD (gastroesophageal reflux disease)    Heart murmur    Hemorrhoid    Hepatitis    yellow jaundice as a child    HTN (hypertension)    Hx of echocardiogram    a.  Echo (05/2011):  Mild LVH, EF 55-60%, Gr 1 DD, mod LAE.b. Echo (11/14):  Mild LVH, mild focal basal septal hypertrophy, EF 55-60%, Gr 1 DD, mild LAE, atrial septal aneurysm   Hyperlipidemia    Hypertension    Impaired hearing    bilateral- no hearing aids   Jaundice    Myocardial infarction (HCC)    hx of x 2 3-4 years ago per pt   Obesity    OSA (obstructive sleep apnea)    needs no cpap per patient pt denies   Other psoriasis    ongoing- none at present.   Sleep apnea    Vertigo     Current Outpatient Medications:    albuterol (VENTOLIN HFA) 108 (90 Base) MCG/ACT inhaler, TAKE 2 PUFFS BY MOUTH EVERY 6 HOURS AS NEEDED FOR WHEEZE OR SHORTNESS OF BREATH (Patient taking differently: Inhale 2 puffs into the lungs every 6 (six) hours as needed for wheezing or shortness of breath.), Disp: 18 each, Rfl: 1   alendronate (FOSAMAX) 70 MG tablet, TAKE 1 TABLET EVERY 7 DAYS WITH A FULL GLASS OF WTER ON AN EMPTY STOMACH, Disp: 4 tablet, Rfl: 11   Alirocumab (  PRALUENT) 75 MG/ML SOAJ, Inject 75 mg into the skin every 14 (fourteen) days., Disp: 4 mL, Rfl: 12   ALPRAZolam (XANAX) 0.5 MG tablet, TAKE 1 TABLET BY MOUTH TWICE A DAY AS NEEDED FOR ANXIETY, Disp: 60 tablet, Rfl: 0   aspirin EC 81 MG tablet, Take 81 mg by mouth daily., Disp: , Rfl:    budesonide-formoterol (SYMBICORT) 80-4.5 MCG/ACT inhaler, Inhale 2 puffs into the lungs 2 (two) times daily., Disp: 1 each, Rfl: 3   Calcium Carbonate-Vitamin D (SUPER CALCIUM 600 + D3 PO), Take 1 tablet by mouth daily., Disp: , Rfl:    diltiazem (CARDIZEM CD) 120 MG 24 hr capsule, TAKE 1 CAPSULE (120 MG TOTAL) BY MOUTH DAILY. PATIENT NEEDS OFFICE VISIT FOR FURTHER REFILLS, Disp: 30 capsule, Rfl: 0    fluticasone (FLONASE) 50 MCG/ACT nasal spray, USE 1 SPRAY INTO EACH NOSTRIL TWICE A DAY, Disp: 48 mL, Rfl: 1   furosemide (LASIX) 20 MG tablet, TAKE 1 TABLET BY MOUTH EVERY DAY, Disp: 30 tablet, Rfl: 4   losartan (COZAAR) 25 MG tablet, TAKE 1 TABLET BY MOUTH TWICE A DAY, Disp: 180 tablet, Rfl: 1   meclizine (ANTIVERT) 25 MG tablet, Take 1 tablet (25 mg total) by mouth 2 (two) times daily as needed for dizziness. Reported on 12/05/2015, Disp: 30 tablet, Rfl: 1   nitroGLYCERIN (NITROSTAT) 0.4 MG SL tablet, Place 1 tablet (0.4 mg total) under the tongue every 5 (five) minutes as needed for chest pain (x 3 doses). Reported on 12/05/2015, Disp: 25 tablet, Rfl: 1   omeprazole (PRILOSEC) 20 MG capsule, TAKE 1 CAPSULE BY MOUTH EVERY DAY, Disp: 90 capsule, Rfl: 0   potassium chloride (KLOR-CON) 10 MEQ tablet, TAKE 1 TABLET (10 MEQ TOTAL) BY MOUTH DAILY. NEEDS OFFICE VISIT FOR FURTHER REFILLS, Disp: 30 tablet, Rfl: 0   spironolactone (ALDACTONE) 25 MG tablet, Take 0.5 tablets (12.5 mg total) by mouth 2 (two) times daily., Disp: 90 tablet, Rfl: 3   betamethasone valerate ointment (VALISONE) 0.1 %, Apply 1 application topically 2 (two) times daily as needed (psoriasis). (DO NOT apply to face, groin or axilla) (Patient not taking: Reported on 09/15/2021), Disp: 45 g, Rfl: 2   cyclobenzaprine (FLEXERIL) 5 MG tablet, TAKE 1 TABLET BY MOUTH THREE TIMES A DAY AS NEEDED FOR MUSCLE SPASMS (Patient not taking: Reported on 09/15/2021), Disp: 30 tablet, Rfl: 0 Social History   Socioeconomic History   Marital status: Married    Spouse name: Mortimer Fries   Number of children: 3   Years of education: Not on file   Highest education level: Not on file  Occupational History   Occupation: hairdresser  Tobacco Use   Smoking status: Never   Smokeless tobacco: Never  Vaping Use   Vaping Use: Never used  Substance and Sexual Activity   Alcohol use: No   Drug use: No   Sexual activity: Not Currently    Birth control/protection:  Post-menopausal  Other Topics Concern   Not on file  Social History Narrative   Lives with husband, Mortimer Fries. Married x 34 years in Oct 23, 2020.   1 son died in October 23, 2017 from [place].    Social Determinants of Health   Financial Resource Strain: Low Risk    Difficulty of Paying Living Expenses: Not hard at all  Food Insecurity: No Food Insecurity   Worried About Charity fundraiser in the Last Year: Never true   Ran Out of Food in the Last Year: Never true  Transportation Needs: No Transportation Needs  Lack of Transportation (Medical): No   Lack of Transportation (Non-Medical): No  Physical Activity: Insufficiently Active   Days of Exercise per Week: 2 days   Minutes of Exercise per Session: 10 min  Stress: No Stress Concern Present   Feeling of Stress : Only a little  Social Connections: Engineer, building services of Communication with Friends and Family: More than three times a week   Frequency of Social Gatherings with Friends and Family: More than three times a week   Attends Religious Services: More than 4 times per year   Active Member of [employer] or Organizations: Yes   Attends Music therapist: More than 4 times per year   Marital Status: Married  Human resources officer Violence: Not At Risk   Fear of Current or Ex-Partner: No   Emotionally Abused: No   Physically Abused: No   Sexually Abused: No   Family History  Problem Relation Age of Onset   Stroke Mother    Hypertension Mother    Heart failure Mother    Breast cancer Mother    Aneurysm Father    Seizures Brother    Muscular dystrophy Daughter        dx as infant, passed age 88   Seizures Son    Colon cancer Neg Hx    Esophageal cancer Neg Hx    Rectal cancer Neg Hx    Stomach cancer Neg Hx     Objective: Office vital signs reviewed. BP 124/71    Pulse 73    Temp (!) 97.4 F (36.3 C)    Ht 5\' 5"  (1.651 m)    Wt 264 lb 6.4 oz (119.9 kg)    SpO2 96%    BMI 44.00 kg/m   Physical Examination:  General:  Awake, alert, morbidly obese, No acute distress Cardio: regular rate and rhythm, S1S2 heard, no murmurs appreciated Pulm: clear to auscultation bilaterally, no wheezes, rhonchi or rales; normal work of breathing on room air  Assessment/ Plan: 76 y.o. female   Controlled type 2 diabetes mellitus with other specified complication, without long-term current use of insulin ([place]) - Plan: Bayer DCA Hb A1c Waived, Semaglutide,0.25 or 0.5MG /DOS, (OZEMPIC, 0.25 OR 0.5 MG/DOSE,) 2 MG/1.5ML SOPN  Hypertension associated with diabetes ([place])  Hyperlipidemia associated with type 2 diabetes mellitus ([place])  Morbid obesity ([place]) - Plan: Semaglutide,0.25 or 0.5MG /DOS, (OZEMPIC, 0.25 OR 0.5 MG/DOSE,) 2 MG/1.5ML SOPN  We will start her on low-dose Ozempic of 0.25 mg subcutaneously each week.  Rx has been sent to pharmacy.  Would like her to see Almyra Free in 1 month for recheck.  She may titrate up to 0.5 mg each week if she is doing well at that time.  We will plan to follow-up in 3 months.  We discussed the potential side effects of this medication.  There are no apparent contraindications to the medication.  Blood pressure is at goal.  No changes  Continue current regimen with Lipitor 80 mg.  Not yet due for fasting lipid  Orders Placed This Encounter  Procedures   Bayer DCA Hb A1c Waived   No orders of the defined types were placed in this encounter.    Janora Norlander, DO [place] [phone]

## 2021-09-22 NOTE — Telephone Encounter (Signed)
Patient scheduled with pharmd for 10/03/21 ?

## 2021-10-03 ENCOUNTER — Ambulatory Visit: Payer: Medicare HMO

## 2021-10-06 ENCOUNTER — Ambulatory Visit (INDEPENDENT_AMBULATORY_CARE_PROVIDER_SITE_OTHER): Payer: Medicare HMO | Admitting: Pharmacist

## 2021-10-06 DIAGNOSIS — E782 Mixed hyperlipidemia: Secondary | ICD-10-CM

## 2021-10-06 DIAGNOSIS — E119 Type 2 diabetes mellitus without complications: Secondary | ICD-10-CM

## 2021-10-06 NOTE — Patient Instructions (Signed)
Visit Information ? ?Following are the goals we discussed today:  ?Current Barriers:  ?Unable to achieve control of CHOLESTEROL & T2DM/BLOOD SUGAR  ? ?Pharmacist Clinical Goal(s):  ?patient will verbalize ability to afford treatment regimen ?achieve control of CHOLESTEROL as evidenced by  through collaboration with PharmD and provider.  ? ?Interventions: ?1:1 collaboration with Janora Norlander, DO regarding development and update of comprehensive plan of care as evidenced by provider attestation and co-signature ?Inter-disciplinary care team collaboration (see longitudinal plan of care) ?Comprehensive medication review performed; medication list updated in electronic medical record ? ? ?Diabetes: ?-a1c 6.3% but weight loss/cardiac protection needed ?-starting ozempic (will have to get patient assistance) ?-sample given to patient -->starting ozempic 0.'25mg'$  weekly then increase to 0.'5mg'$  weekly ?-Denies personal and family history of Medullary thyroid cancer (MTC) ? ? ?HYPERLIPIDEMIA ?Uncontrolled; current treatment:PRALUENT Q14 DAYS ?Intolerances --> rosuvastatin, ATORVASTATIN '80MG'$  TWICE WEEKLY (patient has stopped due to side effects-asked her to discuss with cardiologist);  ?Significant cardiac history; follows with cardiology who also recommended PCSK9 ?[place] not covered on insurace  ?Called in [place] but over >$100/month (COVERED ON INSURANCE UNTIL 06/2022), HOWEVER patient would enter coverage gap ?[place] ?Tolerating well ?APPLICATION FOR [employer] GRANT APPROVED! $0 copay using grant foundation card for 1 year total ?CONTINUE Atorvastatin-patient has stopped, needs to discuss with cards ?HAVING MYOPATHY--DECREASE TO TWICE WEEKLY NOW ON [place] ?Recheck lipids at PCP f/u next month ? ?Lipid Panel  ?   ?Component Value Date/Time  ? CHOL 147 09/19/2021 0828  ? TRIG 52 09/19/2021 0828  ? HDL 53 09/19/2021 0828  ? CHOLHDL 2.8 09/19/2021 0828  ? CHOLHDL 3.0 07/11/2016 1507  ? VLDL 13  07/11/2016 1507  ? [place] 83 09/19/2021 0828  ? LABVLDL 11 09/19/2021 0828  ? ? ?Recommended contineu praluent ?Assessed patient finances. APPLICATION FOR [employer] GRANT APPROVED --> GRANT RUNS OUT 06/2022  ?-Discussed with patient that praluent can help reduce her cardiac risk significantly while bringing LDL to goal ?-Control of T2DM/eating healthier diet will be very beneficial as well -->discussed lifestyle changes ?  ?Patient Goals/Self-Care Activities ?patient will:  ?- take medications as prescribed ?collaborate with provider on medication access solutions ? ?Follow Up Plan: Telephone follow up appointment with care management team member scheduled for: 3 MONTHS ? ? ?Plan: Telephone follow up appointment with care management team member scheduled for:  3-6 months ? ?Signature ?Regina Eck, PharmD, BCPS ?Clinical Pharmacist, [facility] ?[place]  [facility] Phone [phone] ? ? ?Please call the care guide team at [phone] if you need to cancel or reschedule your appointment.  ? ?The patient verbalized understanding of instructions, educational materials, and care plan provided today and declined offer to receive copy of patient instructions, educational materials, and care plan.  ? ?

## 2021-10-06 NOTE — Progress Notes (Signed)
? ? ?Chronic Care Management ?Pharmacy Note ? ?10/06/2021 ?Name:  Sara [name] MRN:  [id number] DOB:  [birth year] ? ?Summary: ? ?Diabetes: ?-a1c 6.3% but weight loss/cardiac protection needed ?-starting ozempic (will have to get patient assistance) ?-sample given to patient -->starting ozempic 0.27m weekly then increase to 0.531mweekly ?-Denies personal and family history of Medullary thyroid cancer (MTC) ? ? ?Subjective: ?Sara [name] an [age].o. year old female who is a primary patient of GoJanora NorlanderDO.  The CCM team was consulted for assistance with disease management and care coordination needs.   ? ?Engaged with patient face to face for follow up visit in response to provider referral for pharmacy case management and/or care coordination services.  ? ?Consent to Services:  ?The patient was given information about Chronic Care Management services, agreed to services, and gave verbal consent prior to initiation of services.  Please see initial visit note for detailed documentation.  ? ?Patient Care Team: ?GoJanora NorlanderDO as PCP - General (Family Medicine) ?Bensimhon, DaShaune PascalMD (Cardiology) ?AlGaynelle ArabianMD as Consulting Physician (Orthopedic Surgery) ?PrLavera GuiseRPEast Cooper Medical CenterPharmacist) ? ?Objective: ? ?Lab Results  ?Component Value Date  ? CREATININE 0.63 01/20/2021  ? CREATININE 0.75 08/23/2020  ? CREATININE 0.85 12/11/2019  ? ? ?Lab Results  ?Component Value Date  ? HGBA1C 6.3 (H) 09/18/2021  ? ?Last diabetic Eye exam:  ?Lab Results  ?Component Value Date/Time  ? HMDIABEYEEXA No Retinopathy 06/12/2021 12:00 AM  ?  ?Last diabetic Foot exam: No results found for: HMDIABFOOTEX  ? ?   ?Component Value Date/Time  ? CHOL 147 09/19/2021 0828  ? TRIG 52 09/19/2021 0828  ? HDL 53 09/19/2021 0828  ? CHOLHDL 2.8 09/19/2021 0828  ? CHOLHDL 3.0 07/11/2016 1507  ? VLDL 13 07/11/2016 1507  ? [place]3 09/19/2021 0828  ? ? ? ?  Latest Ref Rng & Units 01/20/2021  ?  5:20 PM 08/23/2020  ?  8:15  AM 12/11/2019  ?  1:53 PM  ?Hepatic Function  ?Total Protein 6.5 - 8.1 g/dL 6.5   6.1   5.9    ?Albumin 3.5 - 5.0 g/dL 3.5   3.7   3.7    ?AST 15 - 41 U/L '29   16   21    ' ?ALT 0 - 44 U/L '20   12   12    ' ?Alk Phosphatase 38 - 126 U/L 54   72   74    ?Total Bilirubin 0.3 - 1.2 mg/dL 0.9   1.1   0.9    ? ? ?Lab Results  ?Component Value Date/Time  ? TSH 1.370 12/11/2019 01:53 PM  ? TSH 1.910 02/02/2019 10:15 AM  ? ? ? ?  Latest Ref Rng & Units 01/20/2021  ?  5:20 PM 02/02/2019  ? 10:15 AM 04/08/2018  ?  2:29 PM  ?CBC  ?WBC 4.0 - 10.5 K/uL 7.7   5.6   5.1    ?Hemoglobin 12.0 - 15.0 g/dL 13.8   14.6   14.1    ?Hematocrit 36.0 - 46.0 % 40.4   42.1   42.5    ?Platelets 150 - 400 K/uL 189   159   181    ? ? ?Lab Results  ?Component Value Date/Time  ? VD25OH 37.7 02/24/2021 09:01 AM  ? VD25OH 42.2 04/15/2020 03:31 PM  ? ? ?Clinical ASCVD: Yes  ?The ASCVD Risk score (Arnett DK, et al., 2019) failed to calculate for  the following reasons: ?  The patient has a prior MI or stroke diagnosis   ? ?Other: (CHADS2VASc if Afib, PHQ9 if depression, MMRC or CAT for COPD, ACT, DEXA) ? ?Social History  ? ?Tobacco Use  ?Smoking Status Never  ?Smokeless Tobacco Never  ? ?BP Readings from Last 3 Encounters:  ?09/18/21 124/71  ?09/15/21 128/80  ?03/10/21 (!) 94/59  ? ?Pulse Readings from Last 3 Encounters:  ?09/18/21 73  ?09/15/21 62  ?03/10/21 68  ? ?Wt Readings from Last 3 Encounters:  ?09/18/21 264 lb 6.4 oz (119.9 kg)  ?09/15/21 261 lb 3.2 oz (118.5 kg)  ?07/05/21 253 lb (114.8 kg)  ? ? ?Assessment: Review of patient past medical history, allergies, medications, health status, including review of consultants reports, laboratory and other test data, was performed as part of comprehensive evaluation and provision of chronic care management services.  ? ?SDOH:  (Social Determinants of Health) assessments and interventions performed:  ? ? ?CCM Care Plan ? ?Allergies  ?Allergen Reactions  ? Latex Other (See Comments)  ? Nifedipine Other (See  Comments)  ?  Brings blood pressure up really fast PROCARDIA  ? Tape   ?  bleeding  ? Codeine Itching  ? Crestor [Rosuvastatin] Other (See Comments)  ?  myalgias  ? ? ?Medications Reviewed Today   ? ? Reviewed by [place], [place] W, PT (Physical Therapist) on 10/25/21 at 1256  Med List Status: <None>  ? ?Medication Order Taking? Sig Documenting Provider Last Dose Status Informant  ?albuterol (VENTOLIN HFA) 108 (90 Base) MCG/ACT inhaler [id number] No TAKE 2 PUFFS BY MOUTH EVERY 6 HOURS AS NEEDED FOR WHEEZE OR SHORTNESS OF BREATH  ?Patient taking differently: Inhale 2 puffs into the lungs every 6 (six) hours as needed for wheezing or shortness of breath.  ? Janora Norlander, DO Taking Active   ?alendronate (FOSAMAX) 70 MG tablet [id number] No TAKE 1 TABLET EVERY 7 DAYS WITH A FULL GLASS OF WTER ON AN EMPTY STOMACH Ronnie Doss M, DO Taking Active   ?         ?Med Note Lavera Guise   Tue Apr 11, 2021  4:15 PM) Started on 04/19/20  ?Alirocumab (PRALUENT) 75 MG/ML SOAJ [id number] No Inject 75 mg into the skin every 14 (fourteen) days. Janora Norlander, DO Taking Active   ?         ?Med Note Lavera Guise   Wed Jun 07, 2021  3:55 PM) [place]  ?ALPRAZolam (XANAX) 0.5 MG tablet [id number] No TAKE 1 TABLET BY MOUTH TWICE A DAY AS NEEDED FOR ANXIETY Hawks, Christy A, FNP Taking Active   ?aspirin EC 81 MG tablet [id number] No Take 81 mg by mouth daily. [provider] Taking Active Self  ?betamethasone valerate ointment (VALISONE) 0.1 % [id number] No Apply 1 application topically 2 (two) times daily as needed (psoriasis). (DO NOT apply to face, groin or axilla)  ?Patient not taking: Reported on 09/15/2021  ? Janora Norlander, DO Not Taking Active   ?budesonide-formoterol (SYMBICORT) 80-4.5 MCG/ACT inhaler [id number] No Inhale 2 puffs into the lungs 2 (two) times daily. Janora Norlander, DO Taking Active Self  ?         ?Med Note Parthenia Ames Aug 31, 2021  2:03 PM) Via  [place] ?  ?Calcium Carbonate-Vitamin D (SUPER CALCIUM 600 + D3 PO) [id number] No Take 1 tablet by mouth daily. [provider] Taking Active Self  ?cyclobenzaprine (  FLEXERIL) 5 MG tablet [id number] No TAKE 1 TABLET BY MOUTH THREE TIMES A DAY AS NEEDED FOR MUSCLE SPASMS  ?Patient not taking: Reported on 09/15/2021  ? Sharion Balloon, FNP Not Taking Active   ?diltiazem (CARDIZEM CD) 120 MG 24 hr capsule [id number]  Take 1 capsule (120 mg total) by mouth daily. Ronnie Doss M, DO  Active   ?fluticasone [facility]) 50 MCG/ACT nasal spray [id number] No USE 1 SPRAY INTO EACH NOSTRIL TWICE A DAY Ronnie Doss M, DO Taking Active   ?furosemide (LASIX) 20 MG tablet [id number] No TAKE 1 TABLET BY MOUTH EVERY DAY Ronnie Doss M, DO Taking Active   ?losartan (COZAAR) 25 MG tablet [id number] No TAKE 1 TABLET BY MOUTH TWICE A DAY Gottschalk, Ashly M, DO Taking Active   ?meclizine (ANTIVERT) 25 MG tablet [id number] No Take 1 tablet (25 mg total) by mouth 2 (two) times daily as needed for dizziness. Reported on 12/05/2015 Sharion Balloon, FNP Taking Active Self  ?nitroGLYCERIN (NITROSTAT) 0.4 MG SL tablet [id number] No Place 1 tablet (0.4 mg total) under the tongue every 5 (five) minutes as needed for chest pain (x 3 doses). Reported on 12/05/2015 Janora Norlander, DO Taking Active   ?omeprazole (PRILOSEC) 20 MG capsule [id number] No TAKE 1 CAPSULE BY MOUTH EVERY DAY Ronnie Doss M, DO Taking Active   ?potassium chloride (KLOR-CON) 10 MEQ tablet [id number]  Take 1 tablet (10 mEq total) by mouth daily. Janora Norlander, DO  Active   ?Semaglutide,0.25 or 0.5MG/DOS, (OZEMPIC, 0.25 OR 0.5 MG/DOSE,) 2 MG/1.5ML SOPN [id number]  Inject 0.25 mg into the skin every 7 (seven) days for 28 days, THEN 0.5 mg every 7 (seven) days. Janora Norlander, DO  Active   ?spironolactone (ALDACTONE) 25 MG tablet [id number]  TAKE 0.5 TABLETS BY MOUTH 2 TIMES DAILY. Janora Norlander, DO  Active   ? ?  ?  ? ?   ? ? ?Patient Active Problem List  ? Diagnosis Date Noted  ? New onset type 2 diabetes mellitus ([place]) 08/23/2020  ? COVID-19 virus infection 07/08/2019  ? Hyperlipidemia associated with type 2 diabetes [place]

## 2021-10-08 ENCOUNTER — Other Ambulatory Visit: Payer: Self-pay | Admitting: Family Medicine

## 2021-10-08 DIAGNOSIS — E1159 Type 2 diabetes mellitus with other circulatory complications: Secondary | ICD-10-CM

## 2021-10-12 ENCOUNTER — Other Ambulatory Visit: Payer: Self-pay | Admitting: Family Medicine

## 2021-10-12 DIAGNOSIS — E1159 Type 2 diabetes mellitus with other circulatory complications: Secondary | ICD-10-CM

## 2021-10-17 DIAGNOSIS — M5451 Vertebrogenic low back pain: Secondary | ICD-10-CM | POA: Diagnosis not present

## 2021-10-20 DIAGNOSIS — E119 Type 2 diabetes mellitus without complications: Secondary | ICD-10-CM

## 2021-10-20 DIAGNOSIS — E782 Mixed hyperlipidemia: Secondary | ICD-10-CM | POA: Diagnosis not present

## 2021-10-25 ENCOUNTER — Ambulatory Visit: Payer: Medicare HMO | Attending: Orthopedic Surgery | Admitting: Physical Therapy

## 2021-10-25 DIAGNOSIS — M6281 Muscle weakness (generalized): Secondary | ICD-10-CM | POA: Insufficient documentation

## 2021-10-25 DIAGNOSIS — M5459 Other low back pain: Secondary | ICD-10-CM | POA: Insufficient documentation

## 2021-10-25 NOTE — Therapy (Signed)
[place] ?[facility] ?[address] ?[place], [place], [address] ?Phone: [phone]   Fax:  [phone] ? ?Physical Therapy Evaluation ? ?Patient Details  ?Name: Sara [name] ?MRN: [id number] ?Date of Birth: [birth year] ?Referring Provider (PT): Melina Schools MD ? ? ?Encounter Date: 10/25/2021 ? ? PT End of Session - 10/25/21 1256   ? ? Visit Number 1   ? Number of Visits 12   ? Date for PT Re-Evaluation 11/22/21   ? Authorization Type PROGRESS NOTE AT 10TH VISIT.  KX MODIFIER AFTER 15 VISITS.   ? PT Start Time 1036   ? PT Stop Time 1128   ? PT Time Calculation (min) 52 min   ? Activity Tolerance Patient tolerated treatment well   ? Behavior During Therapy [facility] for tasks assessed/performed   ? ?  ?  ? ?  ? ? ?Past Medical History:  ?Diagnosis Date  ? Arthritis   ? Arthritis -hands -Bil. knee replacemnts  ? AVNRT (AV nodal re-entry tachycardia) ([place])   ? s/p RFCA 09/2011  ? Bronchitis   ? none recent  ? CAD (coronary artery disease)   ? a. s/p Xience DES x 3 to LAD;  b. nuc study 02/27/10: EF 67% no ischemia;  c.  echo 11/12: Mild LVH, EF 09-98%, grade 1 diastolic dysfunction, moderate LAE;  d.  LHC 07/03/11: LAD stents patent, RCA 25%, EF 55-65% ;  e. [place]. Myoview 5/14:  No ischemia, EF 68%  ? COVID-19 2021  ? Depression   ? Diverticular disease   ? GERD (gastroesophageal reflux disease)   ? Heart murmur   ? Hemorrhoid   ? Hepatitis   ? yellow jaundice as a child   ? HTN (hypertension)   ? Hx of echocardiogram   ? a.  Echo (05/2011):  Mild LVH, EF 55-60%, Gr 1 DD, mod LAE.b. Echo (11/14):  Mild LVH, mild focal basal septal hypertrophy, EF 55-60%, Gr 1 DD, mild LAE, atrial septal aneurysm  ? Hyperlipidemia   ? Hypertension   ? Impaired hearing   ? bilateral- no hearing aids  ? Jaundice   ? Myocardial infarction [facility])   ? hx of x 2 3-4 years ago per pt  ? Obesity   ? OSA (obstructive sleep apnea)   ? needs no cpap per patient pt denies  ? Other psoriasis   ? ongoing- none at  present.  ? Sleep apnea   ? Vertigo   ? ? ?Past Surgical History:  ?Procedure Laterality Date  ? ABDOMINAL HYSTERECTOMY    ? BACK SURGERY    ? Dr. Lawernce Pitts  ? CARDIAC CATHETERIZATION  08/23/2011  ? Ablation AV  Node  ? CATARACT EXTRACTION Bilateral   ? COLONOSCOPY    ? CORONARY ANGIOPLASTY    ? with stents 2009   ? KNEE SURGERY    ? right  ? LEFT AND RIGHT HEART CATHETERIZATION WITH CORONARY ANGIOGRAM N/A 08/18/2013  ? Procedure: LEFT AND RIGHT HEART CATHETERIZATION WITH CORONARY ANGIOGRAM;  Surgeon: Larey Dresser, MD;  Location: [facility] CATH LAB;  Service: Cardiovascular;  Laterality: N/A;  ? LEFT HEART CATHETERIZATION WITH CORONARY ANGIOGRAM N/A 07/03/2011  ? Procedure: LEFT HEART CATHETERIZATION WITH CORONARY ANGIOGRAM;  Surgeon: Minus Breeding, MD;  Location: [facility] CATH LAB;  Service: Cardiovascular;  Laterality: N/A;  ? REPAIR RECTOCELE    ? SUPRAVENTRICULAR TACHYCARDIA ABLATION N/A 08/23/2011  ? Procedure: SUPRAVENTRICULAR TACHYCARDIA ABLATION;  Surgeon: Evans Lance, MD;  Location: [facility] CATH LAB;  Service: Cardiovascular;  Laterality: N/A;  ? TOTAL HIP ARTHROPLASTY Right 02/23/2016  ? Procedure: RIGHT TOTAL HIP ARTHROPLASTY ANTERIOR APPROACH;  Surgeon: Rod Can, MD;  Location: [facility] ORS;  Service: Orthopedics;  Laterality: Right;  ? TOTAL KNEE ARTHROPLASTY Left 12/22/2012  ? Procedure: LEFT TOTAL KNEE ARTHROPLASTY;  Surgeon: Gearlean Alf, MD;  Location: [facility] ORS;  Service: Orthopedics;  Laterality: Left;  ? TUBAL LIGATION    ? UPPER GASTROINTESTINAL ENDOSCOPY    ? ? ?There were no vitals filed for this visit. ? ? ? Subjective Assessment - 10/25/21 1258   ? ? Subjective The patient presents to the clinic today with c/o left-sided low back pain over the last 6 months.  She states she cannot do dishes very long unless she supports herself with an UE.  She also reports that getting up from a commode has produced severe pain.  She can stand and walk only abot 10 minutes before she has to sit down due to pain.  Resting  in a seated position decreases her pain.   ? Pertinent History Lumbar surgery, HTN, bilateral TKA's, CAD.   ? How long can you sit comfortably? Unlimited.   ? How long can you stand comfortably? <10 minutes.   ? How long can you walk comfortably? <10 minutes.   ? Patient Stated Goals Get out of pain.   ? Currently in Pain? Yes   ? Pain Score 8    ? Pain Location Back   ? Pain Orientation Right   ? Pain Descriptors / Indicators Sharp   ? Pain Type Chronic pain   ? Pain Onset More than a month ago   ? Pain Frequency Constant   ? Aggravating Factors  See above.   ? Pain Relieving Factors See above.   ? ?  ?  ? ?  ? ? ? ? ? [facility] PT Assessment - 10/25/21 0001   ? ?  ? Assessment  ? Medical Diagnosis Vertebrogenic low back pain.   ? Referring Provider (PT) Melina Schools MD   ? Onset Date/Surgical Date --   ~6 months.  ?  ? Precautions  ? Precautions None   ?  ? Restrictions  ? Weight Bearing Restrictions No   ?  ? Balance Screen  ? Has the patient fallen in the past 6 months No   ? Has the patient had a decrease in activity level because of a fear of falling?  No   ? Is the patient reluctant to leave their home because of a fear of falling?  No   ?  ? Home Environment  ? Living Environment Private residence   ?  ? Prior Function  ? Level of Independence Independent   ?  ? Posture/Postural Control  ? Posture/Postural Control Postural limitations   ? Postural Limitations Rounded Shoulders;Forward head   ?  ? Deep Tendon Reflexes  ? DTR Assessment Site Patella;Achilles   ? Patella DTR 1+   ? Achilles DTR 0   ?  ? ROM / Strength  ? AROM / PROM / Strength AROM;Strength   ?  ? AROM  ? Overall AROM Comments Full lumbar flexion and extension.   ?  ? Strength  ? Overall Strength Comments Essentially normal LE strength.   ?  ? Palpation  ? Palpation comment Very tender to palpation over left SIJ and associated musculature.   ?  ? Special Tests  ? Other special tests (+) Left FABER test.  (-) SLR test.  Left  LE longer due to an  anterior pelvic rotation which was corrected with a left SKTC stretch.   ?  ? Ambulation/Gait  ? Gait Comments Gait antalgia observed today.   ? ?  ?  ? ?  ? ? ? ? ? ? ? ? ? ? ? ? ? ?Objective measurements completed on examination: See above findings.  ? ? ? ? ? [place] Adult PT Treatment/Exercise - 10/25/21 0001   ? ?  ? Modalities  ? Modalities Electrical Stimulation   ?  ? Electrical Stimulation  ? Electrical Stimulation Location Right SIJ.   ? Electrical Stimulation Action Pre-mod.   ? Electrical Stimulation Parameters 80-150 Hz. x 20 minutes.   ? Electrical Stimulation Goals Tone;Pain   ? ?  ?  ? ?  ? ? ? ? ? ? ? ? ? ? ? ? ? ? ? PT Long Term Goals - 10/25/21 1355   ? ?  ? PT LONG TERM GOAL #1  ? Title Patient will be independent with her HEP.   ? Time 4   ? Period Weeks   ? Status New   ?  ? PT LONG TERM GOAL #2  ? Title Sit to stand with pain not > 2-3/10.   ? Time 4   ? Period Weeks   ? Status New   ?  ? PT LONG TERM GOAL #3  ? Title Perform ADL's with pain not > 3/10.   ? Time 4   ? Period Weeks   ? Status New   ?  ? PT LONG TERM GOAL #4  ? Title Stand 20 minutes with LBP not > 3/10.   ? Period Weeks   ? Status New   ? ?  ?  ? ?  ? ? ? ? ? ? ? ? ? Plan - 10/25/21 1348   ? ? Clinical Impression Statement The patient presents to OPPT with c/o left-sided low back pain that has been ongoing for about 6 months.  She has severe sharp pain with certain movements.  She was very tender to palpation over her left SIJ.  She demonstrated a positive left FABER test.  Her left LE was longer than right due to an anterior rotation of her pelvis that was corrected with a SKTC stretch.  She was instructed to perform at home.  Her pain prohibits her from performing ADL's and walking and standing for proloned periods of time.Patient will benefit from skilled physical therapy intervention to address pain and deficits.   ? Personal Factors and Comorbidities Comorbidity 1;Comorbidity 2;Other   ? Comorbidities Lumbar surgery, HTN,  bilateral TKA's, CAD.   ? Examination-Activity Limitations Other;Locomotion Level;Stand   ? Examination-Participation Restrictions Other;Meal Prep;Yard Work;Cleaning   ? Stability/Clinical Decision Making Stable/U

## 2021-11-01 ENCOUNTER — Ambulatory Visit: Payer: Medicare HMO | Admitting: Physical Therapy

## 2021-11-01 DIAGNOSIS — M5459 Other low back pain: Secondary | ICD-10-CM | POA: Diagnosis not present

## 2021-11-01 DIAGNOSIS — M6281 Muscle weakness (generalized): Secondary | ICD-10-CM | POA: Diagnosis not present

## 2021-11-01 NOTE — Therapy (Signed)
[place] ?[facility] ?[place] ?[place], [place], [address] ?Phone: [phone]   Fax:  [phone] ? ?Physical Therapy Treatment ? ?Patient Details  ?Name: Sara [name] ?MRN: [id number] ?Date of Birth: 1946-07-18 ?Referring Provider (PT): Melina Schools MD ? ? ?Encounter Date: 11/01/2021 ? ? PT End of Session - 11/01/21 1237   ? ? Visit Number 2   ? Number of Visits 12   ? Date for PT Re-Evaluation 11/22/21   ? Authorization Type PROGRESS NOTE AT 10TH VISIT.  KX MODIFIER AFTER 15 VISITS.   ? PT Start Time 1115   ? PT Stop Time 1206   ? PT Time Calculation (min) 51 min   ? Activity Tolerance Patient tolerated treatment well   ? Behavior During Therapy [facility] for tasks assessed/performed   ? ?  ?  ? ?  ? ? ?Past Medical History:  ?Diagnosis Date  ? Arthritis   ? Arthritis -hands -Bil. knee replacemnts  ? AVNRT (AV nodal re-entry tachycardia) ([place])   ? s/p RFCA 09/2011  ? Bronchitis   ? none recent  ? CAD (coronary artery disease)   ? a. s/p Xience DES x 3 to LAD;  b. nuc study 02/27/10: EF 67% no ischemia;  c.  echo 11/12: Mild LVH, EF 35-70%, grade 1 diastolic dysfunction, moderate LAE;  d.  LHC 07/03/11: LAD stents patent, RCA 25%, EF 55-65% ;  e. [place]. Myoview 5/14:  No ischemia, EF 68%  ? COVID-19 2021  ? Depression   ? Diverticular disease   ? GERD (gastroesophageal reflux disease)   ? Heart murmur   ? Hemorrhoid   ? Hepatitis   ? yellow jaundice as a child   ? HTN (hypertension)   ? Hx of echocardiogram   ? a.  Echo (05/2011):  Mild LVH, EF 55-60%, Gr 1 DD, mod LAE.b. Echo (11/14):  Mild LVH, mild focal basal septal hypertrophy, EF 55-60%, Gr 1 DD, mild LAE, atrial septal aneurysm  ? Hyperlipidemia   ? Hypertension   ? Impaired hearing   ? bilateral- no hearing aids  ? Jaundice   ? Myocardial infarction [facility])   ? hx of x 2 3-4 years ago per pt  ? Obesity   ? OSA (obstructive sleep apnea)   ? needs no cpap per patient pt denies  ? Other psoriasis   ? ongoing- none at  present.  ? Sleep apnea   ? Vertigo   ? ? ?Past Surgical History:  ?Procedure Laterality Date  ? ABDOMINAL HYSTERECTOMY    ? BACK SURGERY    ? Dr. Lawernce Pitts  ? CARDIAC CATHETERIZATION  08/23/2011  ? Ablation AV  Node  ? CATARACT EXTRACTION Bilateral   ? COLONOSCOPY    ? CORONARY ANGIOPLASTY    ? with stents 2009   ? KNEE SURGERY    ? right  ? LEFT AND RIGHT HEART CATHETERIZATION WITH CORONARY ANGIOGRAM N/A 08/18/2013  ? Procedure: LEFT AND RIGHT HEART CATHETERIZATION WITH CORONARY ANGIOGRAM;  Surgeon: Larey Dresser, MD;  Location: [facility] CATH LAB;  Service: Cardiovascular;  Laterality: N/A;  ? LEFT HEART CATHETERIZATION WITH CORONARY ANGIOGRAM N/A 07/03/2011  ? Procedure: LEFT HEART CATHETERIZATION WITH CORONARY ANGIOGRAM;  Surgeon: Minus Breeding, MD;  Location: [facility] CATH LAB;  Service: Cardiovascular;  Laterality: N/A;  ? REPAIR RECTOCELE    ? SUPRAVENTRICULAR TACHYCARDIA ABLATION N/A 08/23/2011  ? Procedure: SUPRAVENTRICULAR TACHYCARDIA ABLATION;  Surgeon: Evans Lance, MD;  Location: [facility] CATH LAB;  Service: Cardiovascular;  Laterality: N/A;  ? TOTAL HIP ARTHROPLASTY Right 02/23/2016  ? Procedure: RIGHT TOTAL HIP ARTHROPLASTY ANTERIOR APPROACH;  Surgeon: Rod Can, MD;  Location: [facility];  Service: Orthopedics;  Laterality: Right;  ? TOTAL KNEE ARTHROPLASTY Left 12/22/2012  ? Procedure: LEFT TOTAL KNEE ARTHROPLASTY;  Surgeon: Gearlean Alf, MD;  Location: [facility]S;  Service: Orthopedics;  Laterality: Left;  ? TUBAL LIGATION    ? UPPER GASTROINTESTINAL ENDOSCOPY    ? ? ?There were no vitals filed for this visit. ? ? Subjective Assessment - 11/01/21 1241   ? ? Subjective Felt good after last treatment, yesterday was pretty rough but stretch helped.  Today not too bad.   ? Pertinent History Lumbar surgery, HTN, bilateral TKA's, CAD.   ? How long can you sit comfortably? Unlimited.   ? How long can you stand comfortably? <10 minutes.   ? How long can you walk comfortably? <10 minutes.   ? Patient Stated Goals Get out  of pain.   ? Currently in Pain? Yes   ? Pain Score 5    ? Pain Location Back   ? Pain Orientation Right   ? Pain Descriptors / Indicators Sharp   ? Pain Type Chronic pain   ? Pain Onset More than a month ago   ? ?  ?  ? ?  ? ? ? ? ? ? ? ? ? ? ? ? ? ? ? ? ? ? ? ? [place] Adult PT Treatment/Exercise - 11/01/21 0001   ? ?  ? Exercises  ? Exercises Knee/Hip   ?  ? Knee/Hip Exercises: Aerobic  ? Nustep Level 3 x 10 minutes.   ?  ? Modalities  ? Modalities Electrical Stimulation   ?  ? Electrical Stimulation  ? Electrical Stimulation Location RT LB/SIJ   ? Electrical Stimulation Action IFC at 80-150 Hz.   ? Electrical Stimulation Parameters 40% scan x 18 minutes.   ? Electrical Stimulation Goals Pain;Tone   ?  ? Manual Therapy  ? Manual Therapy Soft tissue mobilization   ? Soft tissue mobilization Right sdly position with pillow between knees for comfort:  STW/M x 13 minutes to patient's left low back, SIJ and left upper gluteal region with ischemic release technique utilized to decrease pain and tone.   ? ?  ?  ? ?  ? ? ? ? ? ? ? ? ? ? ? ? ? ? ? PT Long Term Goals - 10/25/21 1355   ? ?  ? PT LONG TERM GOAL #1  ? Title Patient will be independent with her HEP.   ? Time 4   ? Period Weeks   ? Status New   ?  ? PT LONG TERM GOAL #2  ? Title Sit to stand with pain not > 2-3/10.   ? Time 4   ? Period Weeks   ? Status New   ?  ? PT LONG TERM GOAL #3  ? Title Perform ADL's with pain not > 3/10.   ? Time 4   ? Period Weeks   ? Status New   ?  ? PT LONG TERM GOAL #4  ? Title Stand 20 minutes with LBP not > 3/10.   ? Period Weeks   ? Status New   ? ?  ?  ? ?  ? ? ? ? ? ? ? ? Plan - 11/01/21 1242   ? ? Clinical Impression Statement Patient did well with treatment today.  She  was found to be quoite tender over her left lumbar musculature today.  She responded well to soft tissue work and felt better following treatment.   ? Personal Factors and Comorbidities Comorbidity 1;Comorbidity 2;Other   ? Comorbidities Lumbar surgery, HTN,  bilateral TKA's, CAD.   ? Examination-Activity Limitations Other;Locomotion Level;Stand   ? Examination-Participation Restrictions Other;Meal Prep;Yard Work;Cleaning   ? Stability/Clinical Decision Making Stable/Uncomplicated   ? Rehab Potential Excellent   ? PT Frequency 3x / week   ? PT Duration 4 weeks   ? PT Treatment/Interventions ADLs/Self Care Home Management;Cryotherapy;Electrical Stimulation;Ultrasound;Moist Heat;Therapeutic activities;Therapeutic exercise;Manual techniques;Passive range of motion;Dry needling   ? PT Next Visit Plan FOTO.Marland KitchenMarland KitchenCombo e'stim/US, STW/M, SHTC on left to equalize leg lengths.  Hip bridges.   ? Consulted and Agree with Plan of Care Patient   ? ?  ?  ? ?  ? ? ?Patient will benefit from skilled therapeutic intervention in order to improve the following deficits and impairments:  Pain, Abnormal gait, Decreased activity tolerance, Increased muscle spasms ? ?Visit Diagnosis: ?Other low back pain ? ? ? ? ?Problem List ?Patient Active Problem List  ? Diagnosis Date Noted  ? New onset type 2 diabetes mellitus ([place]) 08/23/2020  ? COVID-19 virus infection 07/08/2019  ? Hyperlipidemia associated with type 2 diabetes mellitus ([place]) 08/27/2018  ? Morbid obesity ([place]) 08/27/2018  ? Chronic left-sided low back pain with left-sided sciatica 05/10/2017  ? Coronary artery disease involving native coronary artery of native heart without angina pectoris 03/20/2017  ? LBBB (left bundle branch block) 03/20/2017  ? Age related osteoporosis 10/17/2016  ? Acute rhinosinusitis 10/17/2016  ? Primary osteoarthritis of right hip 02/23/2016  ? Hypokalemia 01/20/2015  ? OA (osteoarthritis) of knee 12/22/2012  ? AVNRT (AV nodal re-entry tachycardia) ([place])   ? Atrial arrhythmia 07/03/2011  ? OBSTRUCTIVE SLEEP APNEA 03/15/2010  ? EDEMA 02/28/2009  ? Hyperlipidemia 12/09/2008  ? Hypertension associated with diabetes ([place]) 12/09/2008  ? Coronary atherosclerosis 12/09/2008  ? HEMORRHOIDS 12/09/2008  ? GERD 12/09/2008  ?  PSORIASIS 12/09/2008  ? Arthritis 12/09/2008  ? VERTIGO 12/09/2008  ? CARDIAC MURMUR 12/09/2008  ? ? ?Shloma Roggenkamp, [place], PT ?11/01/2021, 12:45 PM ? ?[place] ?[facility] ?[address]

## 2021-11-03 ENCOUNTER — Ambulatory Visit (INDEPENDENT_AMBULATORY_CARE_PROVIDER_SITE_OTHER): Payer: Medicare HMO | Admitting: Pharmacist

## 2021-11-03 VITALS — Wt 249.0 lb

## 2021-11-03 DIAGNOSIS — E119 Type 2 diabetes mellitus without complications: Secondary | ICD-10-CM

## 2021-11-03 DIAGNOSIS — E1169 Type 2 diabetes mellitus with other specified complication: Secondary | ICD-10-CM

## 2021-11-03 NOTE — Progress Notes (Signed)
? ? ?Chronic Care Management ?Pharmacy Note ? ?11/03/2021 ?Name:  Sara [name] MRN:  [id number] DOB:  1945/12/09 ? ?Summary: ? ?Diabetes: ?-A1c 6.3% but weight loss/cardiac protection needed ?-INCREASE Ozempic to 0.60m weekly ?-sample given to patient  ? -patient approved for novo nordisk patient assistance program ? -awaiting shipment ? -Reports 15lb weight loss this year thus far ?-Denies personal and family history of Medullary thyroid cancer (MTC) ? ? ?Subjective: ?Sara RDennis Killileais an 76y.o. year old female who is a primary patient of GJanora Norlander DO.  The CCM team was consulted for assistance with disease management and care coordination needs.   ? ?Engaged with patient face to face for follow up visit in response to provider referral for pharmacy case management and/or care coordination services.  ? ?Consent to Services:  ?The patient was given information about Chronic Care Management services, agreed to services, and gave verbal consent prior to initiation of services.  Please see initial visit note for detailed documentation.  ? ?Patient Care Team: ?GJanora Norlander DO as PCP - General (Family Medicine) ?Bensimhon, DShaune Pascal MD (Cardiology) ?AGaynelle Arabian MD as Consulting Physician (Orthopedic Surgery) ?[facility](Ph[facility]ist) ? ? ?Objective: ? ?Lab Results  ?Component Value Date  ? CREATININE 0.63 01/20/2021  ? CREATININE 0.75 08/23/2020  ? CREATININE 0.85 12/11/2019  ? ? ?Lab Results  ?Component Value Date  ? HGBA1C 6.3 (H) 09/18/2021  ? ?Last diabetic Eye exam:  ?Lab Results  ?Component Value Date/Time  ? HMDIABEYEEXA No Retinopathy 06/12/2021 12:00 AM  ?  ?Last diabetic Foot exam: No results found for: HMDIABFOOTEX  ? ?   ?Component Value Date/Time  ? CHOL 147 09/19/2021 0828  ? TRIG 52 09/19/2021 0828  ? HDL 53 09/19/2021 0828  ? CHOLHDL 2.8 09/19/2021 0828  ? CHOLHDL 3.0 07/11/2016 1507  ? VLDL 13 07/11/2016 1507  ? LDacula83 09/19/2021 0828  ? ? ? ?  Latest Ref  Rng & Units 01/20/2021  ?  5:20 PM 08/23/2020  ?  8:15 AM 12/11/2019  ?  1:53 PM  ?Hepatic Function  ?Total Protein 6.5 - 8.1 g/dL 6.5   6.1   5.9    ?Albumin 3.5 - 5.0 g/dL 3.5   3.7   3.7    ?AST 15 - 41 U/L _0 ?ALT 0 - 44 U/L _1 ?Alk Phosphatase 38 - 126 U/L 54   72   74    ?Total Bilirubin 0.3 - 1.2 mg/dL 0.9   1.1   0.9    ? ? ?Lab Results  ?Component Value Date/Time  ? TSH 1.370 12/11/2019 01:53 PM  ? TSH 1.910 02/02/2019 10:15 AM  ? ? ? ?  Latest Ref Rng & Units 01/20/2021  ?  5:20 PM 02/02/2019  ? 10:15 AM 04/08/2018  ?  2:29 PM  ?CBC  ?WBC 4.0 - 10.5 K/uL 7.7   5.6   5.1    ?Hemoglobin 12.0 - 15.0 g/dL 13.8   14.6   14.1    ?Hematocrit 36.0 - 46.0 % 40.4   42.1   42.5    ?Platelets 150 - 400 K/uL 189   159   181    ? ? ?Lab Results  ?Component Value Date/Time  ? VD25OH 37.7 02/24/2021 09:01 AM  ? VD25OH 42.2 04/15/2020 03:31 PM  ? ? ?Clinical ASCVD: Yes  ?The ASCVD  Risk score (Arnett DK, et al., 2019) failed to calculate for the following reasons: ?  The patient has a prior MI or stroke diagnosis   ? ?Other: (CHADS2VASc if Afib, PHQ9 if depression, MMRC or CAT for COPD, ACT, DEXA) ? ?Social History  ? ?Tobacco Use  ?Smoking Status Never  ?Smokeless Tobacco Never  ? ?BP Readings from Last 3 Encounters:  ?09/18/21 124/71  ?09/15/21 128/80  ?03/10/21 (!) 94/59  ? ?Pulse Readings from Last 3 Encounters:  ?09/18/21 73  ?09/15/21 62  ?03/10/21 68  ? ?Wt Readings from Last 3 Encounters:  ?11/03/21 249 lb (112.9 kg)  ?09/18/21 264 lb 6.4 oz (119.9 kg)  ?09/15/21 261 lb 3.2 oz (118.5 kg)  ? ? ?Assessment: Review of patient past medical history, allergies, medications, health status, including review of consultants reports, laboratory and other test data, was performed as part of comprehensive evaluation and provision of chronic care management services.  ? ?SDOH:  (Social Determinants of Health) assessments and interventions performed:  ? ? ?CCM Care Plan ? ?Allergies  ?Allergen Reactions  ? Latex  Other (See Comments)  ? Nifedipine Other (See Comments)  ?  Brings blood pressure up really fast PROCARDIA  ? Tape   ?  bleeding  ? Codeine Itching  ? Crestor [Rosuvastatin] Other (See Comments)  ?  myalgias  ? ? ?Medications Reviewed Today   ? ? Reviewed by Lavera Guise, [facility] (Pharmacist) on 11/07/21 at 1146  Med List Status: <None>  ? ?Medication Order Taking? Sig Documenting Provider Last Dose Status Informant  ?albuterol (VENTOLIN HFA) 108 (90 Base) MCG/ACT inhaler [id number] No TAKE 2 PUFFS BY MOUTH EVERY 6 HOURS AS NEEDED FOR WHEEZE OR SHORTNESS OF BREATH  ?Patient taking differently: Inhale 2 puffs into the lungs every 6 (six) hours as needed for wheezing or shortness of breath.  ? Janora Norlander, DO Taking Active   ?alendronate (FOSAMAX) 70 MG tablet [id number] No TAKE 1 TABLET EVERY 7 DAYS WITH A FULL GLASS OF WTER ON AN EMPTY STOMACH Ronnie Doss M, DO Taking Active   ?         ?Med Note Lavera Guise   Tue Apr 11, 2021  4:15 PM) Started on 04/19/20  ?Alirocumab (PRALUENT) 75 MG/ML SOAJ [id number] No Inject 75 mg into the skin every 14 (fourteen) days. Janora Norlander, DO Taking Active   ?         ?Med Note Lavera Guise   Wed Jun 07, 2021  3:55 PM) [place]  ?ALPRAZolam (XANAX) 0.5 MG tablet [id number] No TAKE 1 TABLET BY MOUTH TWICE A DAY AS NEEDED FOR ANXIETY Hawks, Christy A, FNP Taking Active   ?aspirin EC 81 MG tablet [id number] No Take 81 mg by mouth daily. [provider] Taking Active Self  ?betamethasone valerate ointment (VALISONE) 0.1 % [id number] No Apply 1 application topically 2 (two) times daily as needed (psoriasis). (DO NOT apply to face, groin or axilla)  ?Patient not taking: Reported on 09/15/2021  ? Janora Norlander, DO Not Taking Active   ?budesonide-formoterol (SYMBICORT) 80-4.5 MCG/ACT inhaler [id number] No Inhale 2 puffs into the lungs 2 (two) times daily. Janora Norlander, DO Taking Active Self  ?         ?Med Note Parthenia Ames Aug 31, 2021  2:03 PM) Via [place] patient [place] ?  ?Calcium Carbonate-Vitamin D (SUPER CALCIUM 600 + D3 PO) [id number] No Take 1 tablet by  mouth daily. [provider] Taking Active Self  ?cyclobenzaprine (FLEXERIL) 5 MG tablet [id number] No TAKE 1 TABLET BY MOUTH THREE TIMES A DAY AS NEEDED FOR MUSCLE SPASMS  ?Patient not taking: Reported on 09/15/2021  ? Sharion Balloon, FNP Not Taking Active   ?diltiazem (CARDIZEM CD) 120 MG 24 hr capsule [id number]  Take 1 capsule (120 mg total) by mouth daily. Ronnie Doss M, DO  Active   ?fluticasone [facility]) 50 MCG/ACT nasal spray [id number] No USE 1 SPRAY INTO EACH NOSTRIL TWICE A DAY Ronnie Doss M, DO Taking Active   ?furosemide (LASIX) 20 MG tablet [id number] No TAKE 1 TABLET BY MOUTH EVERY DAY Ronnie Doss M, DO Taking Active   ?losartan (COZAAR) 25 MG tablet [id number] No TAKE 1 TABLET BY MOUTH TWICE A DAY Gottschalk, Ashly M, DO Taking Active   ?meclizine (ANTIVERT) 25 MG tablet [id number] No Take 1 tablet (25 mg total) by mouth 2 (two) times daily as needed for dizziness. Reported on 12/05/2015 Sharion Balloon, FNP Taking Active Self  ?nitroGLYCERIN (NITROSTAT) 0.4 MG SL tablet [id number] No Place 1 tablet (0.4 mg total) under the tongue every 5 (five) minutes as needed for chest pain (x 3 doses). Reported on 12/05/2015 Janora Norlander, DO Taking Active   ?omeprazole (PRILOSEC) 20 MG capsule [id number] No TAKE 1 CAPSULE BY MOUTH EVERY DAY Ronnie Doss M, DO Taking Active   ?potassium chloride (KLOR-CON) 10 MEQ tablet [id number]  Take 1 tablet (10 mEq total) by mouth daily. Janora Norlander, DO  Active   ?Semaglutide,0.25 or 0.5MG/DOS, (OZEMPIC, 0.25 OR 0.5 MG/DOSE,) 2 MG/1.5ML SOPN [id number]  Inject 0.25 mg into the skin every 7 (seven) days for 28 days, THEN 0.5 mg every 7 (seven) days. Janora Norlander, DO  Active   ?         ?Med Note (Geet Hosking D   Tue Nov 07, 2021 11:46 AM) Via novo nordisk patient assistance  program ? ?  ?spironolactone (ALDACTONE) 25 MG tablet [id number]  TAKE 0.5 TABLETS BY MOUTH 2 TIMES DAILY. Janora Norlander, DO  Active   ? ?  ?  ? ?  ? ? ?Patient Active Problem List  ? Diagnosis Date N

## 2021-11-07 NOTE — Patient Instructions (Signed)
Visit Information ? ?Following are the goals we discussed today:  ?Current Barriers:  ?Unable to achieve control of CHOLESTEROL & T2DM/BLOOD SUGAR  ? ?Pharmacist Clinical Goal(s):  ?patient will verbalize ability to afford treatment regimen ?achieve control of CHOLESTEROL as evidenced by  through collaboration with PharmD and provider.  ? ?Interventions: ?1:1 collaboration with Janora Norlander, DO regarding development and update of comprehensive plan of care as evidenced by provider attestation and co-signature ?Inter-disciplinary care team collaboration (see longitudinal plan of care) ?Comprehensive medication review performed; medication list updated in electronic medical record ? ? ?Diabetes: ?-A1c 6.3% but weight loss/cardiac protection needed ?-INCREASE Ozempic to 0.'5mg'$  weekly ?-sample given to patient  ? -patient approved for novo nordisk patient assistance program ? -awaiting shipment ?-Denies personal and family history of Medullary thyroid cancer (MTC) ? ? ?HYPERLIPIDEMIA ?Uncontrolled; current treatment: PRALUENT Q14 DAYS ?Intolerances --> rosuvastatin, ATORVASTATIN '80MG'$  TWICE WEEKLY (patient has stopped due to side effects-asked her to discuss with cardiologist);  ?Significant cardiac history; follows with cardiology who also recommended PCSK9 ?[place] not covered on insurace  ?Called in [place] but over >$100/month (COVERED ON INSURANCE UNTIL 06/2022), HOWEVER patient would enter coverage gap ?[place] ?Tolerating well ?[place]! $0 copay using grant foundation card for 1 year total ?Atorvastatin-patient has stopped, needs to discuss with cards ?HAVING MYOPATHY--DECREASE TO TWICE WEEKLY NOW ON [place] ?Recheck lipids at PCP f/u next month ? ?Lipid Panel  ?   ?Component Value Date/Time  ? CHOL 147 09/19/2021 0828  ? TRIG 52 09/19/2021 0828  ? HDL 53 09/19/2021 0828  ? CHOLHDL 2.8 09/19/2021 0828  ? CHOLHDL 3.0 07/11/2016 1507  ? VLDL 13  07/11/2016 1507  ? [place] 83 09/19/2021 0828  ? LABVLDL 11 09/19/2021 0828  ? ? ?Recommended contineu praluent ?Assessed patient finances. APPLICATION FOR [employer] GRANT APPROVED --> GRANT RUNS OUT 06/2022  ?-Discussed with patient that praluent can help reduce her cardiac risk significantly while bringing LDL to goal ?-Control of T2DM/eating healthier diet will be very beneficial as well -->discussed lifestyle changes ?  ?Patient Goals/Self-Care Activities ?patient will:  ?- take medications as prescribed ?collaborate with provider on medication access solutions ? ?Follow Up Plan: Telephone follow up appointment with care management team member scheduled for: 3 MONTHS ? ? ?Plan: Telephone follow up appointment with care management team member scheduled for:  1 month ? ?Signature ?Regina Eck, PharmD, BCPS ?Clinical Pharmacist, [facility] ?[place]  II Phone [phone] ? ? ?Please call the care guide team at [phone] if you need to cancel or reschedule your appointment.  ? ?The patient verbalized understanding of instructions, educational materials, and care plan provided today and declined offer to receive copy of patient instructions, educational materials, and care plan.  ? ?

## 2021-11-08 ENCOUNTER — Telehealth: Payer: Self-pay | Admitting: Emergency Medicine

## 2021-11-08 ENCOUNTER — Ambulatory Visit: Payer: Medicare HMO | Admitting: Physical Therapy

## 2021-11-08 ENCOUNTER — Other Ambulatory Visit: Payer: Self-pay | Admitting: Family Medicine

## 2021-11-08 DIAGNOSIS — M5459 Other low back pain: Secondary | ICD-10-CM | POA: Diagnosis not present

## 2021-11-08 DIAGNOSIS — M6281 Muscle weakness (generalized): Secondary | ICD-10-CM | POA: Diagnosis not present

## 2021-11-08 NOTE — Telephone Encounter (Signed)
Last CMP 01/20/21. Please review ?

## 2021-11-08 NOTE — Patient Instructions (Signed)
[place] ?Created by Mali Mareo Portilla Apr 19th, 2023 ?View at www.my-exercise-code.com using code: [id number] ?Total 1 Page 1 of 1 ?Bridges ?While laying on your back, contract the low abdominals and lift ?from the hips. ?Repeat 15 Times Hold 2 Seconds ?Complete 2 Sets Perform 2 Times a Day ?

## 2021-11-08 NOTE — Therapy (Signed)
[place] ?[facility] ?[address] ?[place], [place], [address] ?Phone: [phone]   Fax:  [phone] ? ?Physical Therapy Treatment ? ?Patient Details  ?Name: Sara [name] ?MRN: [id number] ?Date of Birth: 1945-12-09 ?Referring Provider (PT): Melina Schools MD ? ? ?Encounter Date: 11/08/2021 ? ? PT End of Session - 11/08/21 1215   ? ? Visit Number 3   ? Number of Visits 12   ? Date for PT Re-Evaluation 11/22/21   ? Authorization Type PROGRESS NOTE AT 10TH VISIT.  KX MODIFIER AFTER 15 VISITS.   ? PT Start Time 1030   ? PT Stop Time 1122   ? PT Time Calculation (min) 52 min   ? Activity Tolerance Patient tolerated treatment well   ? Behavior During Therapy [facility] for tasks assessed/performed   ? ?  ?  ? ?  ? ? ?Past Medical History:  ?Diagnosis Date  ? Arthritis   ? Arthritis -hands -Bil. knee replacemnts  ? AVNRT (AV nodal re-entry tachycardia) ([place])   ? s/p RFCA 09/2011  ? Bronchitis   ? none recent  ? CAD (coronary artery disease)   ? a. s/p Xience DES x 3 to LAD;  b. nuc study 02/27/10: EF 67% no ischemia;  c.  echo 11/12: Mild LVH, EF 50-27%, grade 1 diastolic dysfunction, moderate LAE;  d.  LHC 07/03/11: LAD stents patent, RCA 25%, EF 55-65% ;  e. [place]. Myoview 5/14:  No ischemia, EF 68%  ? COVID-19 2021  ? Depression   ? Diverticular disease   ? GERD (gastroesophageal reflux disease)   ? Heart murmur   ? Hemorrhoid   ? Hepatitis   ? yellow jaundice as a child   ? HTN (hypertension)   ? Hx of echocardiogram   ? a.  Echo (05/2011):  Mild LVH, EF 55-60%, Gr 1 DD, mod LAE.b. Echo (11/14):  Mild LVH, mild focal basal septal hypertrophy, EF 55-60%, Gr 1 DD, mild LAE, atrial septal aneurysm  ? Hyperlipidemia   ? Hypertension   ? Impaired hearing   ? bilateral- no hearing aids  ? Jaundice   ? Myocardial infarction [facility])   ? hx of x 2 3-4 years ago per pt  ? Obesity   ? OSA (obstructive sleep apnea)   ? needs no cpap per patient pt denies  ? Other psoriasis   ? ongoing- none at  present.  ? Sleep apnea   ? Vertigo   ? ? ?Past Surgical History:  ?Procedure Laterality Date  ? ABDOMINAL HYSTERECTOMY    ? BACK SURGERY    ? Dr. Lawernce Pitts  ? CARDIAC CATHETERIZATION  08/23/2011  ? Ablation AV  Node  ? CATARACT EXTRACTION Bilateral   ? COLONOSCOPY    ? CORONARY ANGIOPLASTY    ? with stents 2009   ? KNEE SURGERY    ? right  ? LEFT AND RIGHT HEART CATHETERIZATION WITH CORONARY ANGIOGRAM N/A 08/18/2013  ? Procedure: LEFT AND RIGHT HEART CATHETERIZATION WITH CORONARY ANGIOGRAM;  Surgeon: Larey Dresser, MD;  Location: [facility] CATH LAB;  Service: Cardiovascular;  Laterality: N/A;  ? LEFT HEART CATHETERIZATION WITH CORONARY ANGIOGRAM N/A 07/03/2011  ? Procedure: LEFT HEART CATHETERIZATION WITH CORONARY ANGIOGRAM;  Surgeon: Minus Breeding, MD;  Location: [facility] CATH LAB;  Service: Cardiovascular;  Laterality: N/A;  ? REPAIR RECTOCELE    ? SUPRAVENTRICULAR TACHYCARDIA ABLATION N/A 08/23/2011  ? Procedure: SUPRAVENTRICULAR TACHYCARDIA ABLATION;  Surgeon: Evans Lance, MD;  Location[facility] CATH LAB;  Service: Cardiovascular;  Laterality: N/A;  ? TOTAL HIP ARTHROPLASTY Right 02/23/2016  ? Procedure: RIGHT TOTAL HIP ARTHROPLASTY ANTERIOR APPROACH;  Surgeon: Rod Can, MD;  Location: [facility] ORS;  Service: Orthopedics;  Laterality: Right;  ? TOTAL KNEE ARTHROPLASTY Left 12/22/2012  ? Procedure: LEFT TOTAL KNEE ARTHROPLASTY;  Surgeon: Gearlean Alf, MD;  Location: [facility] ORS;  Service: Orthopedics;  Laterality: Left;  ? TUBAL LIGATION    ? UPPER GASTROINTESTINAL ENDOSCOPY    ? ? ?There were no vitals filed for this visit. ? ? Subjective Assessment - 11/08/21 1121   ? ? Subjective Did great after last treatment.  Pain a 6 today but no longer constant.   ? Pertinent History Lumbar surgery, HTN, bilateral TKA's, CAD.   ? How long can you sit comfortably? Unlimited.   ? How long can you stand comfortably? <10 minutes.   ? How long can you walk comfortably? <10 minutes.   ? Patient Stated Goals Get out of pain.   ? Currently in  Pain? Yes   ? Pain Score 6    ? Pain Location Back   ? Pain Orientation Left   ? Pain Descriptors / Indicators Sharp   ? Pain Onset More than a month ago   ? ?  ?  ? ?  ? ? ? ? ? ? ? ? ? ? ? ? ? ? ? ? ? ? ? ? [place] Adult PT Treatment/Exercise - 11/08/21 0001   ? ?  ? Exercises  ? Exercises Knee/Hip;Lumbar   ?  ? Lumbar Exercises: Machines for Strengthening  ? Cybex Lumbar Extension 60# x 3 minutes.   ?  ? Lumbar Exercises: Supine  ? Other Supine Lumbar Exercises Hip bridges to fatigue.   ?  ? Knee/Hip Exercises: Aerobic  ? Nustep Level 3 x 12 minutes.   ?  ? Electrical Stimulation  ? Electrical Stimulation Location RT LB/SIJ   ? Electrical Stimulation Action IFC at 80-150 Hz.   ? Electrical Stimulation Parameters 40% scan x 20 minutes.   ? Electrical Stimulation Goals Pain;Tone   ?  ? Manual Therapy  ? Manual Therapy Soft tissue mobilization   ? Soft tissue mobilization RT SDLY position with folded pillow between knees for comfort:  STW/M x 8 minutes to patient's left QL, SIJ and upper gluteal region   ? ?  ?  ? ?  ? ? ? ? ? ? ? ? ? ? ? ? ? ? ? PT Long Term Goals - 10/25/21 1355   ? ?  ? PT LONG TERM GOAL #1  ? Title Patient will be independent with her HEP.   ? Time 4   ? Period Weeks   ? Status New   ?  ? PT LONG TERM GOAL #2  ? Title Sit to stand with pain not > 2-3/10.   ? Time 4   ? Period Weeks   ? Status New   ?  ? PT LONG TERM GOAL #3  ? Title Perform ADL's with pain not > 3/10.   ? Time 4   ? Period Weeks   ? Status New   ?  ? PT LONG TERM GOAL #4  ? Title Stand 20 minutes with LBP not > 3/10.   ? Period Weeks   ? Status New   ? ?  ?  ? ?  ? ? ? ? ? ? ? ? Plan - 11/08/21 1211   ? ? Clinical Impression Statement The patient is  responding well to PT.  She states her pain is no longer constant.  Added back extension machine today without complaint and hip bridges to be part of her HEP.   ? Personal Factors and Comorbidities Comorbidity 1;Comorbidity 2;Other   ? Comorbidities Lumbar surgery, HTN, bilateral  TKA's, CAD.   ? Examination-Activity Limitations Other;Locomotion Level;Stand   ? Examination-Participation Restrictions Other;Meal Prep;Yard Work;Cleaning   ? Stability/Clinical Decision Making Stable/Uncomplicated   ? Rehab Potential Excellent   ? PT Frequency 3x / week   ? PT Duration 4 weeks   ? PT Treatment/Interventions ADLs/Self Care Home Management;Cryotherapy;Electrical Stimulation;Ultrasound;Moist Heat;Therapeutic activities;Therapeutic exercise;Manual techniques;Passive range of motion;Dry needling   ? PT Next Visit Plan FOTO.Marland KitchenMarland KitchenCombo e'stim/US, STW/M, SHTC on left to equalize leg lengths.  Hip bridges.   ? Consulted and Agree with Plan of Care Patient   ? ?  ?  ? ?  ? ? ?Patient will benefit from skilled therapeutic intervention in order to improve the following deficits and impairments:  Pain, Abnormal gait, Decreased activity tolerance, Increased muscle spasms ? ?Visit Diagnosis: ?Other low back pain ? ? ? ? ?Problem List ?Patient Active Problem List  ? Diagnosis Date Noted  ? New onset type 2 diabetes mellitus ([place]) 08/23/2020  ? COVID-19 virus infection 07/08/2019  ? Hyperlipidemia associated with type 2 diabetes mellitus ([place]) 08/27/2018  ? Morbid obesity ([place]) 08/27/2018  ? Chronic left-sided low back pain with left-sided sciatica 05/10/2017  ? Coronary artery disease involving native coronary artery of native heart without angina pectoris 03/20/2017  ? LBBB (left bundle branch block) 03/20/2017  ? Age related osteoporosis 10/17/2016  ? Acute rhinosinusitis 10/17/2016  ? Primary osteoarthritis of right hip 02/23/2016  ? Hypokalemia 01/20/2015  ? OA (osteoarthritis) of knee 12/22/2012  ? AVNRT (AV nodal re-entry tachycardia) ([place])   ? Atrial arrhythmia 07/03/2011  ? OBSTRUCTIVE SLEEP APNEA 03/15/2010  ? EDEMA 02/28/2009  ? Hyperlipidemia 12/09/2008  ? Hypertension associated with diabetes ([place]) 12/09/2008  ? Coronary atherosclerosis 12/09/2008  ? HEMORRHOIDS 12/09/2008  ? GERD 12/09/2008  ? PSORIASIS  12/09/2008  ? Arthritis 12/09/2008  ? VERTIGO 12/09/2008  ? CARDIAC MURMUR 12/09/2008  ? ? ?Mahina Salatino, [place], PT ?11/08/2021, 12:15 PM ? ?[place] ?[facility] ?[place]

## 2021-11-08 NOTE — Telephone Encounter (Signed)
Patient assistance Ozempic here in office. Patient aware to pick up at front desk  ?

## 2021-11-15 ENCOUNTER — Telehealth: Payer: Self-pay

## 2021-11-15 ENCOUNTER — Ambulatory Visit: Payer: Medicare HMO | Admitting: Physical Therapy

## 2021-11-15 DIAGNOSIS — M6281 Muscle weakness (generalized): Secondary | ICD-10-CM

## 2021-11-15 DIAGNOSIS — M5459 Other low back pain: Secondary | ICD-10-CM

## 2021-11-15 NOTE — Telephone Encounter (Signed)
Received notification from [place] regarding approval for [place]. Patient assistance approved UNTIL 06/21/22. ? ?Phone: [phone] ? ?

## 2021-11-15 NOTE — Therapy (Signed)
[place] [facility] ?[place] ?[place], [place], [address] ?Phone: [phone]   Fax:  [phone] ? ?Physical Therapy Treatment ? ?Patient Details  ?Name: Sara [name] ?MRN: [id number] ?Date of Birth: 1946/05/20 ?Referring Provider (PT): Melina Schools MD ? ? ?Encounter Date: 11/15/2021 ? ? PT End of Session - 11/15/21 1207   ? ? Visit Number 4   ? Number of Visits 12   ? Date for PT Re-Evaluation 11/22/21   ? Authorization Type PROGRESS NOTE AT 10TH VISIT.  KX MODIFIER AFTER 15 VISITS.   ? PT Start Time 1030   ? PT Stop Time 1123   ? PT Time Calculation (min) 53 min   ? Activity Tolerance Patient tolerated treatment well   ? Behavior During Therapy [facility] for tasks assessed/performed   ? ?  ?  ? ?  ? ? ?Past Medical History:  ?Diagnosis Date  ? Arthritis   ? Arthritis -hands -Bil. knee replacemnts  ? AVNRT (AV nodal re-entry tachycardia) ([place])   ? s/p RFCA 09/2011  ? Bronchitis   ? none recent  ? CAD (coronary artery disease)   ? a. s/p Xience DES x 3 to LAD;  b. nuc study 02/27/10: EF 67% no ischemia;  c.  echo 11/12: Mild LVH, EF 82-99%, grade 1 diastolic dysfunction, moderate LAE;  d.  LHC 07/03/11: LAD stents patent, RCA 25%, EF 55-65% ;  e. [place]. Myoview 5/14:  No ischemia, EF 68%  ? COVID-19 2021  ? Depression   ? Diverticular disease   ? GERD (gastroesophageal reflux disease)   ? Heart murmur   ? Hemorrhoid   ? Hepatitis   ? yellow jaundice as a child   ? HTN (hypertension)   ? Hx of echocardiogram   ? a.  Echo (05/2011):  Mild LVH, EF 55-60%, Gr 1 DD, mod LAE.b. Echo (11/14):  Mild LVH, mild focal basal septal hypertrophy, EF 55-60%, Gr 1 DD, mild LAE, atrial septal aneurysm  ? Hyperlipidemia   ? Hypertension   ? Impaired hearing   ? bilateral- no hearing aids  ? Jaundice   ? Myocardial infarction [facility])   ? hx of x 2 3-4 years ago per pt  ? Obesity   ? OSA (obstructive sleep apnea)   ? needs no cpap per patient pt denies  ? Other psoriasis   ? ongoing- none at  present.  ? Sleep apnea   ? Vertigo   ? ? ?Past Surgical History:  ?Procedure Laterality Date  ? ABDOMINAL HYSTERECTOMY    ? BACK SURGERY    ? Dr. Lawernce Pitts  ? CARDIAC CATHETERIZATION  08/23/2011  ? Ablation AV  Node  ? CATARACT EXTRACTION Bilateral   ? COLONOSCOPY    ? CORONARY ANGIOPLASTY    ? with stents 2009   ? KNEE SURGERY    ? right  ? LEFT AND RIGHT HEART CATHETERIZATION WITH CORONARY ANGIOGRAM N/A 08/18/2013  ? Procedure: LEFT AND RIGHT HEART CATHETERIZATION WITH CORONARY ANGIOGRAM;  Surgeon: Larey Dresser, MD;  Location: [facility] CATH LAB;  Service: Cardiovascular;  Laterality: N/A;  ? LEFT HEART CATHETERIZATION WITH CORONARY ANGIOGRAM N/A 07/03/2011  ? Procedure: LEFT HEART CATHETERIZATION WITH CORONARY ANGIOGRAM;  Surgeon: Minus Breeding, MD;  Location: [facility] CATH LAB;  Service: Cardiovascular;  Laterality: N/A;  ? REPAIR RECTOCELE    ? SUPRAVENTRICULAR TACHYCARDIA ABLATION N/A 08/23/2011  ? Procedure: SUPRAVENTRICULAR TACHYCARDIA ABLATION;  Surgeon: Evans Lance, MD;  Location: [facility] CATH LAB;  Service: Cardiovascular;  Laterality: N/A;  ? TOTAL HIP ARTHROPLASTY Right 02/23/2016  ? Procedure: RIGHT TOTAL HIP ARTHROPLASTY ANTERIOR APPROACH;  Surgeon: Rod Can, MD;  Location: [facility] ORS;  Service: Orthopedics;  Laterality: Right;  ? TOTAL KNEE ARTHROPLASTY Left 12/22/2012  ? Procedure: LEFT TOTAL KNEE ARTHROPLASTY;  Surgeon: Gearlean Alf, MD;  Location: [facility] ORS;  Service: Orthopedics;  Laterality: Left;  ? TUBAL LIGATION    ? UPPER GASTROINTESTINAL ENDOSCOPY    ? ? ?There were no vitals filed for this visit. ? ? Subjective Assessment - 11/15/21 1207   ? ? Subjective Feel about 50% better.   ? Pertinent History Lumbar surgery, HTN, bilateral TKA's, CAD.   ? How long can you sit comfortably? Unlimited.   ? How long can you stand comfortably? <10 minutes.   ? How long can you walk comfortably? <10 minutes.   ? Patient Stated Goals Get out of pain.   ? Currently in Pain? Yes   ? Pain Score 4    ? Pain Location Back    ? Pain Descriptors / Indicators Sharp   ? Pain Type Chronic pain   ? Pain Onset More than a month ago   ? ?  ?  ? ?  ? ? ? ? ? ? ? ? ? ? ? ? ? ? ? ? ? ? ? ? [place] Adult PT Treatment/Exercise - 11/15/21 0001   ? ?  ? Exercises  ? Exercises Knee/Hip;Lumbar   ?  ? Lumbar Exercises: Aerobic  ? Nustep Level 4 x 15 minutes.   ?  ? Lumbar Exercises: Machines for Strengthening  ? Cybex Lumbar Extension 60# x 4 minutes.   ?  ? Electrical Stimulation  ? Electrical Stimulation Location RT LB/SIJ   ? Electrical Stimulation Action IFC at 80-150 Hz.   ? Electrical Stimulation Parameters 40% scan x 20 minutes.   ? Electrical Stimulation Goals Pain;Tone   ?  ? Manual Therapy  ? Manual Therapy Soft tissue mobilization   ? Soft tissue mobilization STW/M x 6 minutes to patient's left QL/SIJ.   ? ?  ?  ? ?  ? ? ? ? ? ? ? ? ? ? ? ? ? ? ? PT Long Term Goals - 10/25/21 1355   ? ?  ? PT LONG TERM GOAL #1  ? Title Patient will be independent with her HEP.   ? Time 4   ? Period Weeks   ? Status New   ?  ? PT LONG TERM GOAL #2  ? Title Sit to stand with pain not > 2-3/10.   ? Time 4   ? Period Weeks   ? Status New   ?  ? PT LONG TERM GOAL #3  ? Title Perform ADL's with pain not > 3/10.   ? Time 4   ? Period Weeks   ? Status New   ?  ? PT LONG TERM GOAL #4  ? Title Stand 20 minutes with LBP not > 3/10.   ? Period Weeks   ? Status New   ? ?  ?  ? ?  ? ? ? ? ? ? ? ? Plan - 11/15/21 1212   ? ? Clinical Impression Statement Excellent progress.  Leg equal after treatment and no pain reported.   ? Personal Factors and Comorbidities Comorbidity 1;Comorbidity 2;Other   ? Comorbidities Lumbar surgery, HTN, bilateral TKA's, CAD.   ? Examination-Activity Limitations Other;Locomotion Level;Stand   ? Examination-Participation Restrictions Other;Meal Prep;Yard Work;Cleaning   ?  Stability/Clinical Decision Making Stable/Uncomplicated   ? Rehab Potential Excellent   ? PT Frequency 3x / week   ? PT Duration 4 weeks   ? PT Treatment/Interventions ADLs/Self Care  Home Management;Cryotherapy;Electrical Stimulation;Ultrasound;Moist Heat;Therapeutic activities;Therapeutic exercise;Manual techniques;Passive range of motion;Dry needling   ? PT Next Visit Plan FOTO.Marland KitchenMarland KitchenCombo e'stim/US, STW/M, SHTC on left to equalize leg lengths.  Hip bridges.   ? Consulted and Agree with Plan of Care Patient   ? ?  ?  ? ?  ? ? ?Patient will benefit from skilled therapeutic intervention in order to improve the following deficits and impairments:    ? ?Visit Diagnosis: ?Other low back pain ? ?Muscle weakness (generalized) ? ? ? ? ?Problem List ?Patient Active Problem List  ? Diagnosis Date Noted  ? New onset type 2 diabetes mellitus ([place]) 08/23/2020  ? COVID-19 virus infection 07/08/2019  ? Hyperlipidemia associated with type 2 diabetes mellitus ([place]) 08/27/2018  ? Morbid obesity ([place]) 08/27/2018  ? Chronic left-sided low back pain with left-sided sciatica 05/10/2017  ? Coronary artery disease involving native coronary artery of native heart without angina pectoris 03/20/2017  ? LBBB (left bundle branch block) 03/20/2017  ? Age related osteoporosis 10/17/2016  ? Acute rhinosinusitis 10/17/2016  ? Primary osteoarthritis of right hip 02/23/2016  ? Hypokalemia 01/20/2015  ? OA (osteoarthritis) of knee 12/22/2012  ? AVNRT (AV nodal re-entry tachycardia) ([place])   ? Atrial arrhythmia 07/03/2011  ? OBSTRUCTIVE SLEEP APNEA 03/15/2010  ? EDEMA 02/28/2009  ? Hyperlipidemia 12/09/2008  ? Hypertension associated with diabetes ([place]) 12/09/2008  ? Coronary atherosclerosis 12/09/2008  ? HEMORRHOIDS 12/09/2008  ? GERD 12/09/2008  ? PSORIASIS 12/09/2008  ? Arthritis 12/09/2008  ? VERTIGO 12/09/2008  ? CARDIAC MURMUR 12/09/2008  ? ? ?Jolynda Townley, [place], PT ?11/15/2021, 12:16 PM ? ?[place] ?[facility] ?[place] ?[place], [place], [address] ?Phone: [phone]   Fax:  [phone] ? ?Name: Mychaela Lennartz Nowaczyk ?MRN: [id number] ?Date of Birth: 10-Apr-1946 ? ? ? ?

## 2021-11-16 ENCOUNTER — Other Ambulatory Visit: Payer: Self-pay | Admitting: Family Medicine

## 2021-11-16 DIAGNOSIS — E1169 Type 2 diabetes mellitus with other specified complication: Secondary | ICD-10-CM

## 2021-11-19 DIAGNOSIS — E785 Hyperlipidemia, unspecified: Secondary | ICD-10-CM

## 2021-11-19 DIAGNOSIS — E1169 Type 2 diabetes mellitus with other specified complication: Secondary | ICD-10-CM | POA: Diagnosis not present

## 2021-11-19 DIAGNOSIS — E119 Type 2 diabetes mellitus without complications: Secondary | ICD-10-CM

## 2021-11-22 ENCOUNTER — Ambulatory Visit: Payer: Medicare HMO | Attending: Orthopedic Surgery | Admitting: Physical Therapy

## 2021-11-22 DIAGNOSIS — M6281 Muscle weakness (generalized): Secondary | ICD-10-CM | POA: Diagnosis not present

## 2021-11-22 DIAGNOSIS — M5459 Other low back pain: Secondary | ICD-10-CM | POA: Diagnosis not present

## 2021-11-22 DIAGNOSIS — M25551 Pain in right hip: Secondary | ICD-10-CM | POA: Diagnosis not present

## 2021-11-22 NOTE — Therapy (Signed)
[place] ?[facility] ?[place] ?[place], [place], [address] ?Phone: [phone]   Fax:  [phone] ? ?Physical Therapy Treatment ? ?Patient Details  ?Name: Sara [name] ?MRN: [id number] ?Date of Birth: 02-15-1946 ?Referring Provider (PT): Melina Schools MD ? ? ?Encounter Date: 11/22/2021 ? ? PT End of Session - 11/22/21 1149   ? ? Visit Number 5   ? Number of Visits 12   ? Date for PT Re-Evaluation 12/13/21   ? Authorization Type PROGRESS NOTE AT 10TH VISIT.  KX MODIFIER AFTER 15 VISITS.   ? PT Start Time 1022   ? PT Stop Time 1119   ? PT Time Calculation (min) 57 min   ? Activity Tolerance Patient tolerated treatment well   ? Behavior During Therapy [facility] for tasks assessed/performed   ? ?  ?  ? ?  ? ? ?Past Medical History:  ?Diagnosis Date  ? Arthritis   ? Arthritis -hands -Bil. knee replacemnts  ? AVNRT (AV nodal re-entry tachycardia) ([place])   ? s/p RFCA 09/2011  ? Bronchitis   ? none recent  ? CAD (coronary artery disease)   ? a. s/p Xience DES x 3 to LAD;  b. nuc study 02/27/10: EF 67% no ischemia;  c.  echo 11/12: Mild LVH, EF 74-94%, grade 1 diastolic dysfunction, moderate LAE;  d.  LHC 07/03/11: LAD stents patent, RCA 25%, EF 55-65% ;  e. [place]. Myoview 5/14:  No ischemia, EF 68%  ? COVID-19 2021  ? Depression   ? Diverticular disease   ? GERD (gastroesophageal reflux disease)   ? Heart murmur   ? Hemorrhoid   ? Hepatitis   ? yellow jaundice as a child   ? HTN (hypertension)   ? Hx of echocardiogram   ? a.  Echo (05/2011):  Mild LVH, EF 55-60%, Gr 1 DD, mod LAE.b. Echo (11/14):  Mild LVH, mild focal basal septal hypertrophy, EF 55-60%, Gr 1 DD, mild LAE, atrial septal aneurysm  ? Hyperlipidemia   ? Hypertension   ? Impaired hearing   ? bilateral- no hearing aids  ? Jaundice   ? Myocardial infarction [facility])   ? hx of x 2 3-4 years ago per pt  ? Obesity   ? OSA (obstructive sleep apnea)   ? needs no cpap per patient pt denies  ? Other psoriasis   ? ongoing- none at present.   ? Sleep apnea   ? Vertigo   ? ? ?Past Surgical History:  ?Procedure Laterality Date  ? ABDOMINAL HYSTERECTOMY    ? BACK SURGERY    ? Dr. Lawernce Pitts  ? CARDIAC CATHETERIZATION  08/23/2011  ? Ablation AV  Node  ? CATARACT EXTRACTION Bilateral   ? COLONOSCOPY    ? CORONARY ANGIOPLASTY    ? with stents 2009   ? KNEE SURGERY    ? right  ? LEFT AND RIGHT HEART CATHETERIZATION WITH CORONARY ANGIOGRAM N/A 08/18/2013  ? Procedure: LEFT AND RIGHT HEART CATHETERIZATION WITH CORONARY ANGIOGRAM;  Surgeon: Larey Dresser, MD;  Location: [facility] CATH LAB;  Service: Cardiovascular;  Laterality: N/A;  ? LEFT HEART CATHETERIZATION WITH CORONARY ANGIOGRAM N/A 07/03/2011  ? Procedure: LEFT HEART CATHETERIZATION WITH CORONARY ANGIOGRAM;  Surgeon: Minus Breeding, MD;  Location: [facility] CATH LAB;  Service: Cardiovascular;  Laterality: N/A;  ? REPAIR RECTOCELE    ? SUPRAVENTRICULAR TACHYCARDIA ABLATION N/A 08/23/2011  ? Procedure: SUPRAVENTRICULAR TACHYCARDIA ABLATION;  Surgeon: Evans Lance, MD;  Location: [facility] CATH LAB;  Service: Cardiovascular;  Laterality: N/A;  ? TOTAL HIP ARTHROPLASTY Right 02/23/2016  ? Procedure: RIGHT TOTAL HIP ARTHROPLASTY ANTERIOR APPROACH;  Surgeon: Rod Can, MD;  Location: [facility];  Service: Orthopedics;  Laterality: Right;  ? TOTAL KNEE ARTHROPLASTY Left 12/22/2012  ? Procedure: LEFT TOTAL KNEE ARTHROPLASTY;  Surgeon: Gearlean Alf, MD;  Location: [facility] ORS;  Service: Orthopedics;  Laterality: Left;  ? TUBAL LIGATION    ? UPPER GASTROINTESTINAL ENDOSCOPY    ? ? ?There were no vitals filed for this visit. ? ? Subjective Assessment - 11/22/21 1124   ? ? Subjective Been doing good but was siting and planting flowers and bending forward and twisted which increased her pain more toward her mid-back.   ? Pertinent History Lumbar surgery, HTN, bilateral TKA's, CAD.   ? How long can you sit comfortably? Unlimited.   ? How long can you stand comfortably? <10 minutes.   ? How long can you walk comfortably? <10 minutes.   ?  Patient Stated Goals Get out of pain.   ? Currently in Pain? Yes   ? Pain Location Back   ? Pain Orientation Left   ? Pain Descriptors / Indicators Sharp   ? Pain Type Chronic pain   ? Pain Onset More than a month ago   ? ?  ?  ? ?  ? ? ? ? ? ? ? ? ? ? ? ? ? ? ? ? ? ? ? ? [place] Adult PT Treatment/Exercise - 11/22/21 0001   ? ?  ? Exercises  ? Exercises Knee/Hip   ?  ? Lumbar Exercises: Aerobic  ? Nustep Level 4 x 16 minutes.   ?  ? Lumbar Exercises: Machines for Strengthening  ? Cybex Knee Flexion 40# x 4 minutes.   ?  ? Modalities  ? Modalities Electrical Stimulation;Ultrasound   ?  ? Electrical Stimulation  ? Electrical Stimulation Location RT left thoracic region   ? Electrical Stimulation Action Pre-mod.   ? Electrical Stimulation Parameters 80-150 Hz. x 20 minutes.   ? Electrical Stimulation Goals Pain;Tone   ?  ? Ultrasound  ? Ultrasound Location Left lower thoracic   ? Ultrasound Parameters Combo e'stim/US at 1.50 W/CM2 x 8 minutes.   ? ?  ?  ? ?  ? ? ? ? ? ? ? ? ? ? ? ? ? ? ? PT Long Term Goals - 10/25/21 1355   ? ?  ? PT LONG TERM GOAL #1  ? Title Patient will be independent with her HEP.   ? Time 4   ? Period Weeks   ? Status New   ?  ? PT LONG TERM GOAL #2  ? Title Sit to stand with pain not > 2-3/10.   ? Time 4   ? Period Weeks   ? Status New   ?  ? PT LONG TERM GOAL #3  ? Title Perform ADL's with pain not > 3/10.   ? Time 4   ? Period Weeks   ? Status New   ?  ? PT LONG TERM GOAL #4  ? Title Stand 20 minutes with LBP not > 3/10.   ? Period Weeks   ? Status New   ? ?  ?  ? ?  ? ? ? ? ? ? ? ? Plan - 11/22/21 1151   ? ? Clinical Impression Statement Patient flared-up her back sitting and twisting while planting flowers.  Her left low back and SIJ had minimal pain, this  pain was localized to her left lower thoracic region.  She did great with tretament today and felt much better following.   ? Personal Factors and Comorbidities Comorbidity 1;Comorbidity 2;Other   ? Comorbidities Lumbar surgery, HTN,  bilateral TKA's, CAD.   ? Examination-Activity Limitations Other;Locomotion Level;Stand   ? Examination-Participation Restrictions Other;Meal Prep;Yard Work;Cleaning   ? Stability/Clinical Decision Making Stable/Uncomplicated   ? Rehab Potential Excellent   ? PT Frequency 3x / week   ? PT Duration 4 weeks   ? PT Treatment/Interventions ADLs/Self Care Home Management;Cryotherapy;Electrical Stimulation;Ultrasound;Moist Heat;Therapeutic activities;Therapeutic exercise;Manual techniques;Passive range of motion;Dry needling   ? PT Next Visit Plan Combo e'stim/US to left lower thoracic region.   ? Consulted and Agree with Plan of Care Patient   ? ?  ?  ? ?  ? ? ?Patient will benefit from skilled therapeutic intervention in order to improve the following deficits and impairments:  Pain, Abnormal gait, Decreased activity tolerance, Increased muscle spasms ? ?Visit Diagnosis: ?Other low back pain - Plan: PT plan of care cert/re-cert ? ?Muscle weakness (generalized) - Plan: PT plan of care cert/re-cert ? ?Pain in right hip - Plan: PT plan of care cert/re-cert ? ? ? ? ?Problem List ?Patient Active Problem List  ? Diagnosis Date Noted  ? New onset type 2 diabetes mellitus ([place]) 08/23/2020  ? COVID-19 virus infection 07/08/2019  ? Hyperlipidemia associated with type 2 diabetes mellitus ([place]) 08/27/2018  ? Morbid obesity ([place]) 08/27/2018  ? Chronic left-sided low back pain with left-sided sciatica 05/10/2017  ? Coronary artery disease involving native coronary artery of native heart without angina pectoris 03/20/2017  ? LBBB (left bundle branch block) 03/20/2017  ? Age related osteoporosis 10/17/2016  ? Acute rhinosinusitis 10/17/2016  ? Primary osteoarthritis of right hip 02/23/2016  ? Hypokalemia 01/20/2015  ? OA (osteoarthritis) of knee 12/22/2012  ? AVNRT (AV nodal re-entry tachycardia) ([place])   ? Atrial arrhythmia 07/03/2011  ? OBSTRUCTIVE SLEEP APNEA 03/15/2010  ? EDEMA 02/28/2009  ? Hyperlipidemia 12/09/2008  ? Hypertension  associated with diabetes ([place]) 12/09/2008  ? Coronary atherosclerosis 12/09/2008  ? HEMORRHOIDS 12/09/2008  ? GERD 12/09/2008  ? PSORIASIS 12/09/2008  ? Arthritis 12/09/2008  ? VERTIGO 12/09/2008  ? CARDIAC MU

## 2021-11-28 ENCOUNTER — Telehealth: Payer: Medicare HMO

## 2021-11-28 ENCOUNTER — Ambulatory Visit: Payer: Medicare HMO

## 2021-11-29 ENCOUNTER — Ambulatory Visit: Payer: Medicare HMO | Admitting: Physical Therapy

## 2021-11-29 ENCOUNTER — Encounter: Payer: Self-pay | Admitting: Physical Therapy

## 2021-11-29 ENCOUNTER — Other Ambulatory Visit: Payer: Self-pay | Admitting: Family Medicine

## 2021-11-29 DIAGNOSIS — M5459 Other low back pain: Secondary | ICD-10-CM | POA: Diagnosis not present

## 2021-11-29 DIAGNOSIS — M25551 Pain in right hip: Secondary | ICD-10-CM | POA: Diagnosis not present

## 2021-11-29 DIAGNOSIS — M6281 Muscle weakness (generalized): Secondary | ICD-10-CM | POA: Diagnosis not present

## 2021-11-29 NOTE — Therapy (Signed)
[facility] ?[place] ?[place], [place], [address] ?Phone: [phone]   Fax:  [phone] ? ?Physical Therapy Treatment ? ?Patient Details  ?Name: Sara [name] ?MRN: [id number] ?Date of Birth: 1946/03/09 ?Referring Provider (PT): Melina Schools MD ? ? ?Encounter Date: 11/29/2021 ? ? PT End of Session - 11/29/21 1047   ? ? Visit Number 6   ? Number of Visits 12   ? Date for PT Re-Evaluation 12/13/21   ? Authorization Type PROGRESS NOTE AT 10TH VISIT.  KX MODIFIER AFTER 15 VISITS.   ? PT Start Time 1032   ? PT Stop Time 1120   ? PT Time Calculation (min) 48 min   ? Activity Tolerance Patient tolerated treatment well   ? Behavior During Therapy [facility] for tasks assessed/performed   ? ?  ?  ? ?  ? ? ?Past Medical History:  ?Diagnosis Date  ? Arthritis   ? Arthritis -hands -Bil. knee replacemnts  ? AVNRT (AV nodal re-entry tachycardia) ([place])   ? s/p RFCA 09/2011  ? Bronchitis   ? none recent  ? CAD (coronary artery disease)   ? a. s/p Xience DES x 3 to LAD;  b. nuc study 02/27/10: EF 67% no ischemia;  c.  echo 11/12: Mild LVH, EF 86-76%, grade 1 diastolic dysfunction, moderate LAE;  d.  LHC 07/03/11: LAD stents patent, RCA 25%, EF 55-65% ;  e. [place]. Myoview 5/14:  No ischemia, EF 68%  ? COVID-19 2021  ? Depression   ? Diverticular disease   ? GERD (gastroesophageal reflux disease)   ? Heart murmur   ? Hemorrhoid   ? Hepatitis   ? yellow jaundice as a child   ? HTN (hypertension)   ? Hx of echocardiogram   ? a.  Echo (05/2011):  Mild LVH, EF 55-60%, Gr 1 DD, mod LAE.b. Echo (11/14):  Mild LVH, mild focal basal septal hypertrophy, EF 55-60%, Gr 1 DD, mild LAE, atrial septal aneurysm  ? Hyperlipidemia   ? Hypertension   ? Impaired hearing   ? bilateral- no hearing aids  ? Jaundice   ? Myocardial infarction [facility])   ? hx of x 2 3-4 years ago per pt  ? Obesity   ? OSA (obstructive sleep apnea)   ? needs no cpap per patient pt denies  ? Other psoriasis   ? ongoing- none at  present.  ? Sleep apnea   ? Vertigo   ? ? ?Past Surgical History:  ?Procedure Laterality Date  ? ABDOMINAL HYSTERECTOMY    ? BACK SURGERY    ? Dr. Lawernce Pitts  ? CARDIAC CATHETERIZATION  08/23/2011  ? Ablation AV  Node  ? CATARACT EXTRACTION Bilateral   ? COLONOSCOPY    ? CORONARY ANGIOPLASTY    ? with stents 2009   ? KNEE SURGERY    ? right  ? LEFT AND RIGHT HEART CATHETERIZATION WITH CORONARY ANGIOGRAM N/A 08/18/2013  ? Procedure: LEFT AND RIGHT HEART CATHETERIZATION WITH CORONARY ANGIOGRAM;  Surgeon: Larey Dresser, MD;  Location: [facility] CATH LAB;  Service: Cardiovascular;  Laterality: N/A;  ? LEFT HEART CATHETERIZATION WITH CORONARY ANGIOGRAM N/A 07/03/2011  ? Procedure: LEFT HEART CATHETERIZATION WITH CORONARY ANGIOGRAM;  Surgeon: Minus Breeding, MD;  Location: [facility] CATH LAB;  Service: Cardiovascular;  Laterality: N/A;  ? REPAIR RECTOCELE    ? SUPRAVENTRICULAR TACHYCARDIA ABLATION N/A 08/23/2011  ? Procedure: SUPRAVENTRICULAR TACHYCARDIA ABLATION;  Surgeon: Evans Lance, MD;  Location: [facility] CATH LAB;  Service: Cardiovascular;  Laterality: N/A;  ? TOTAL HIP ARTHROPLASTY Right 02/23/2016  ? Procedure: RIGHT TOTAL HIP ARTHROPLASTY ANTERIOR APPROACH;  Surgeon: Rod Can, MD;  Location: [facility] ORS;  Service: Orthopedics;  Laterality: Right;  ? TOTAL KNEE ARTHROPLASTY Left 12/22/2012  ? Procedure: LEFT TOTAL KNEE ARTHROPLASTY;  Surgeon: Gearlean Alf, MD;  Location: [facility] ORS;  Service: Orthopedics;  Laterality: Left;  ? TUBAL LIGATION    ? UPPER GASTROINTESTINAL ENDOSCOPY    ? ? ?There were no vitals filed for this visit. ? ? Subjective Assessment - 11/29/21 1047   ? ? Subjective Patient presented in clinic with no complaints and felt great after her last PT session.   ? Pertinent History Lumbar surgery, HTN, bilateral TKA's, CAD.   ? How long can you sit comfortably? Unlimited.   ? How long can you stand comfortably? <10 minutes.   ? How long can you walk comfortably? <10 minutes.   ? Patient Stated Goals Get out of pain.    ? Currently in Pain? Other (Comment)   No pain assessment provided  ? ?  ?  ? ?  ? ? ? ? ? [facility] PT Assessment - 11/29/21 0001   ? ?  ? Assessment  ? Medical Diagnosis Vertebrogenic low back pain.   ? Referring Provider (PT) Melina Schools MD   ?  ? Precautions  ? Precautions None   ? ?  ?  ? ?  ? ? ? ? ? ? ? ? ? ? ? ? ? ? ? ? [facility] Adult PT Treatment/Exercise - 11/29/21 0001   ? ?  ? Lumbar Exercises: Aerobic  ? Nustep L4 x17 min   ?  ? Modalities  ? Modalities Electrical Stimulation;Moist Heat;Ultrasound   ?  ? Moist Heat Therapy  ? Number Minutes Moist Heat 10 Minutes   ? Moist Heat Location Lumbar Spine   ?  ? Electrical Stimulation  ? Electrical Stimulation Location L lumbar paraspinals   ? Electrical Stimulation Action Pre-Mod   ? Electrical Stimulation Parameters 80-150 hz x10 min   ? Electrical Stimulation Goals Pain;Tone   ?  ? Ultrasound  ? Ultrasound Location L thoracolumbar paraspinals   ? Ultrasound Parameters 1.5 w/cm2, 100%, 1 mhz x10 min   ? Ultrasound Goals Pain   ? ?  ?  ? ?  ? ? ? ? ? ? ? ? ? ? ? ? ? ? ? PT Long Term Goals - 10/25/21 1355   ? ?  ? PT LONG TERM GOAL #1  ? Title Patient will be independent with her HEP.   ? Time 4   ? Period Weeks   ? Status New   ?  ? PT LONG TERM GOAL #2  ? Title Sit to stand with pain not > 2-3/10.   ? Time 4   ? Period Weeks   ? Status New   ?  ? PT LONG TERM GOAL #3  ? Title Perform ADL's with pain not > 3/10.   ? Time 4   ? Period Weeks   ? Status New   ?  ? PT LONG TERM GOAL #4  ? Title Stand 20 minutes with LBP not > 3/10.   ? Period Weeks   ? Status New   ? ?  ?  ? ?  ? ? ? ? ? ? ? ? Plan - 11/29/21 1441   ? ? Clinical Impression Statement Patient presented in clinic with reports of good relief from last  PT session. No complaints during treatment other than palpable sensitivity along L upper lumbar paraspinals. Patient losing weight and willing to continue in order to help with LBP. Normal modalities response noted following removal of the modalities.   ?  Personal Factors and Comorbidities Comorbidity 1;Comorbidity 2;Other   ? Comorbidities Lumbar surgery, HTN, bilateral TKA's, CAD.   ? Examination-Activity Limitations Other;Locomotion Level;Stand   ? Examination-Participation Restrictions Other;Meal Prep;Yard Work;Cleaning   ? Stability/Clinical Decision Making Stable/Uncomplicated   ? Rehab Potential Excellent   ? PT Frequency 3x / week   ? PT Duration 4 weeks   ? PT Treatment/Interventions ADLs/Self Care Home Management;Cryotherapy;Electrical Stimulation;Ultrasound;Moist Heat;Therapeutic activities;Therapeutic exercise;Manual techniques;Passive range of motion;Dry needling   ? PT Next Visit Plan Combo e'stim/US to left lower thoracic region.   ? Consulted and Agree with Plan of Care Patient   ? ?  ?  ? ?  ? ? ?Patient will benefit from skilled therapeutic intervention in order to improve the following deficits and impairments:  Pain, Abnormal gait, Decreased activity tolerance, Increased muscle spasms ? ?Visit Diagnosis: ?Other low back pain ? ?Muscle weakness (generalized) ? ? ? ? ?Problem List ?Patient Active Problem List  ? Diagnosis Date Noted  ? New onset type 2 diabetes mellitus ([place]) 08/23/2020  ? COVID-19 virus infection 07/08/2019  ? Hyperlipidemia associated with type 2 diabetes mellitus ([place]) 08/27/2018  ? Morbid obesity ([place]) 08/27/2018  ? Chronic left-sided low back pain with left-sided sciatica 05/10/2017  ? Coronary artery disease involving native coronary artery of native heart without angina pectoris 03/20/2017  ? LBBB (left bundle branch block) 03/20/2017  ? Age related osteoporosis 10/17/2016  ? Acute rhinosinusitis 10/17/2016  ? Primary osteoarthritis of right hip 02/23/2016  ? Hypokalemia 01/20/2015  ? OA (osteoarthritis) of knee 12/22/2012  ? AVNRT (AV nodal re-entry tachycardia) ([place])   ? Atrial arrhythmia 07/03/2011  ? OBSTRUCTIVE SLEEP APNEA 03/15/2010  ? EDEMA 02/28/2009  ? Hyperlipidemia 12/09/2008  ? Hypertension associated with  diabetes ([place]) 12/09/2008  ? Coronary atherosclerosis 12/09/2008  ? HEMORRHOIDS 12/09/2008  ? GERD 12/09/2008  ? PSORIASIS 12/09/2008  ? Arthritis 12/09/2008  ? VERTIGO 12/09/2008  ? CARDIAC MURMUR 12/09/2008  ? ? ?[place]

## 2021-12-02 ENCOUNTER — Other Ambulatory Visit: Payer: Self-pay | Admitting: Family Medicine

## 2021-12-02 DIAGNOSIS — E1169 Type 2 diabetes mellitus with other specified complication: Secondary | ICD-10-CM

## 2021-12-06 ENCOUNTER — Ambulatory Visit: Payer: Medicare HMO | Admitting: Physical Therapy

## 2021-12-06 DIAGNOSIS — M25551 Pain in right hip: Secondary | ICD-10-CM | POA: Diagnosis not present

## 2021-12-06 DIAGNOSIS — M6281 Muscle weakness (generalized): Secondary | ICD-10-CM | POA: Diagnosis not present

## 2021-12-06 DIAGNOSIS — M5459 Other low back pain: Secondary | ICD-10-CM | POA: Diagnosis not present

## 2021-12-06 NOTE — Therapy (Addendum)
[facility] [place], [place], [address] Phone: [phone]   Fax:  [phone]  Physical Therapy Treatment  Patient Details  Name: Rosamary Boudreau MRN: [id number] Date of Birth: 06/30/1946 Referring Provider (PT): Melina Schools MD   Encounter Date: 12/06/2021   PT End of Session - 12/06/21 1210     Visit Number 7    Number of Visits 12    Date for PT Re-Evaluation 12/13/21    Authorization Type PROGRESS NOTE AT 10TH VISIT.  KX MODIFIER AFTER 15 VISITS.    PT Start Time 1030    PT Stop Time 1124    PT Time Calculation (min) 54 min    Activity Tolerance Patient tolerated treatment well    Behavior During Therapy [facility] for tasks assessed/performed             Past Medical History:  Diagnosis Date   Arthritis    Arthritis -hands -Bil. knee replacemnts   AVNRT (AV nodal re-entry tachycardia) ([place])    s/p RFCA 09/2011   Bronchitis    none recent   CAD (coronary artery disease)    a. s/p Xience DES x 3 to LAD;  b. nuc study 02/27/10: EF 67% no ischemia;  c.  echo 11/12: Mild LVH, EF 58-09%, grade 1 diastolic dysfunction, moderate LAE;  d.  LHC 07/03/11: LAD stents patent, RCA 25%, EF 55-65% ;  e. [place]. Myoview 5/14:  No ischemia, EF 68%   COVID-19 2021   Depression    Diverticular disease    GERD (gastroesophageal reflux disease)    Heart murmur    Hemorrhoid    Hepatitis    yellow jaundice as a child    HTN (hypertension)    Hx of echocardiogram    a.  Echo (05/2011):  Mild LVH, EF 55-60%, Gr 1 DD, mod LAE.b. Echo (11/14):  Mild LVH, mild focal basal septal hypertrophy, EF 55-60%, Gr 1 DD, mild LAE, atrial septal aneurysm   Hyperlipidemia    Hypertension    Impaired hearing    bilateral- no hearing aids   Jaundice    Myocardial infarction ([facility])    hx of x 2 3-4 years ago per pt   Obesity    OSA (obstructive sleep apnea)    needs no cpap per patient pt denies   Other psoriasis    ongoing- none at  present.   Sleep apnea    Vertigo     Past Surgical History:  Procedure Laterality Date   ABDOMINAL HYSTERECTOMY     BACK SURGERY     Dr. Lawernce Pitts   CARDIAC CATHETERIZATION  08/23/2011   Ablation AV  Node   CATARACT EXTRACTION Bilateral    COLONOSCOPY     CORONARY ANGIOPLASTY     with stents 2009    KNEE SURGERY     right   LEFT AND RIGHT HEART CATHETERIZATION WITH CORONARY ANGIOGRAM N/A 08/18/2013   Procedure: LEFT AND RIGHT HEART CATHETERIZATION WITH CORONARY ANGIOGRAM;  Surgeon: Larey Dresser, MD;  Location: [facility] CATH LAB;  Service: Cardiovascular;  Laterality: N/A;   LEFT HEART CATHETERIZATION WITH CORONARY ANGIOGRAM N/A 07/03/2011   Procedure: LEFT HEART CATHETERIZATION WITH CORONARY ANGIOGRAM;  Surgeon: Minus Breeding, MD;  Location: [facility] CATH LAB;  Service: Cardiovascular;  Laterality: N/A;   REPAIR RECTOCELE     SUPRAVENTRICULAR TACHYCARDIA ABLATION N/A 08/23/2011   Procedure: SUPRAVENTRICULAR TACHYCARDIA ABLATION;  Surgeon: Evans Lance, MD;  Location: [facility] CATH LAB;  Service: Cardiovascular;  Laterality: N/A;   TOTAL HIP ARTHROPLASTY Right 02/23/2016   Procedure: RIGHT TOTAL HIP ARTHROPLASTY ANTERIOR APPROACH;  Surgeon: Rod Can, MD;  Location: [facility]S;  Service: Orthopedics;  Laterality: Right;   TOTAL KNEE ARTHROPLASTY Left 12/22/2012   Procedure: LEFT TOTAL KNEE ARTHROPLASTY;  Surgeon: Gearlean Alf, MD;  Location: [facility];  Service: Orthopedics;  Laterality: Left;   TUBAL LIGATION     UPPER GASTROINTESTINAL ENDOSCOPY      There were no vitals filed for this visit.   Subjective Assessment - 12/06/21 1210     Subjective Doing better.  Planted flowers and pain is about a 3.    Pertinent History Lumbar surgery, HTN, bilateral TKA's, CAD.    How long can you sit comfortably? Unlimited.    How long can you stand comfortably? <10 minutes.    How long can you walk comfortably? <10 minutes.    Patient Stated Goals Get out of pain.    Currently in Pain? Yes    Pain  Score 3     Pain Location Back    Pain Orientation Left    Pain Descriptors / Indicators Sharp    Pain Type Chronic pain    Pain Onset More than a month ago                               [facility] Adult PT Treatment/Exercise - 12/06/21 0001       Exercises   Exercises Knee/Hip      Lumbar Exercises: Aerobic   Nustep Level 4 x 15 minutes.      Modalities   Modalities Electrical Stimulation;Ultrasound      Moist Heat Therapy   Number Minutes Moist Heat 20 Minutes    Moist Heat Location Lumbar Spine      Electrical Stimulation   Electrical Stimulation Location Left thoraco-lumbar region.    Electrical Stimulation Action IFC at 80-150 Hz. at 40% scan x 20 minutes.    Electrical Stimulation Goals Tone;Pain      Ultrasound   Ultrasound Location Left thoracolumbar region.    Ultrasound Parameters Combo e'stim/US at 1.50 W/CM2 x 10 minutes.    Ultrasound Goals Pain      Manual Therapy   Manual Therapy Soft tissue mobilization    Soft tissue mobilization STW/M x 5 minutes to patient's left lower thoracic/upper lumbar region.                          PT Long Term Goals - 10/25/21 1355       PT LONG TERM GOAL #1   Title Patient will be independent with her HEP.    Time 4    Period Weeks    Status New      PT LONG TERM GOAL #2   Title Sit to stand with pain not > 2-3/10.    Time 4    Period Weeks    Status New      PT LONG TERM GOAL #3   Title Perform ADL's with pain not > 3/10.    Time 4    Period Weeks    Status New      PT LONG TERM GOAL #4   Title Stand 20 minutes with LBP not > 3/10.    Period Weeks    Status New  Plan - 12/06/21 1214     Clinical Impression Statement Patient's pain is now becoming more localized to to her left thoraco-lumbar region.  She was essentially pain-free following treatment today.    Personal Factors and Comorbidities Comorbidity 1;Comorbidity 2;Other    Comorbidities  Lumbar surgery, HTN, bilateral TKA's, CAD.    Examination-Activity Limitations Other;Locomotion Level;Stand    Examination-Participation Restrictions Other;Meal Prep;Yard Work;Cleaning    Stability/Clinical Decision Making Stable/Uncomplicated    Rehab Potential Excellent    PT Frequency 3x / week    PT Duration 4 weeks    PT Treatment/Interventions ADLs/Self Care Home Management;Cryotherapy;Electrical Stimulation;Ultrasound;Moist Heat;Therapeutic activities;Therapeutic exercise;Manual techniques;Passive range of motion;Dry needling    PT Next Visit Plan Combo e'stim/US to left lower thoracic region.    Consulted and Agree with Plan of Care Patient             Patient will benefit from skilled therapeutic intervention in order to improve the following deficits and impairments:  Pain, Abnormal gait, Decreased activity tolerance, Increased muscle spasms  Visit Diagnosis: Other low back pain     Problem List Patient Active Problem List   Diagnosis Date Noted   New onset type 2 diabetes mellitus ([place]) 08/23/2020   COVID-19 virus infection 07/08/2019   Hyperlipidemia associated with type 2 diabetes mellitus ([place]) 08/27/2018   Morbid obesity ([place]) 08/27/2018   Chronic left-sided low back pain with left-sided sciatica 05/10/2017   Coronary artery disease involving native coronary artery of native heart without angina pectoris 03/20/2017   LBBB (left bundle branch block) 03/20/2017   Age related osteoporosis 10/17/2016   Acute rhinosinusitis 10/17/2016   Primary osteoarthritis of right hip 02/23/2016   Hypokalemia 01/20/2015   OA (osteoarthritis) of knee 12/22/2012   AVNRT (AV nodal re-entry tachycardia) ([place])    Atrial arrhythmia 07/03/2011   OBSTRUCTIVE SLEEP APNEA 03/15/2010   EDEMA 02/28/2009   Hyperlipidemia 12/09/2008   Hypertension associated with diabetes ([place]) 12/09/2008   Coronary atherosclerosis 12/09/2008   HEMORRHOIDS 12/09/2008   GERD 12/09/2008   PSORIASIS  12/09/2008   Arthritis 12/09/2008   VERTIGO 12/09/2008   CARDIAC MURMUR 12/09/2008    Lenix Kidd, [place], PT 12/06/2021, 12:16 PM  [facility] [address] [place], [place], [address] Phone: [phone]   Fax:  [phone]  Name: Jhoana Upham MRN: [id number] Date of Birth: [birth year]   PHYSICAL THERAPY DISCHARGE SUMMARY  Visits from Start of Care: 7.  Current functional level related to goals / functional outcomes: See above.   Remaining deficits: See below.   Education / Equipment: HEP.   Patient agrees to discharge. Patient goals were    . Patient is being discharged due to being pleased with the current functional level.    [place]ali Krikor Willet MPT

## 2021-12-12 ENCOUNTER — Encounter: Payer: Self-pay | Admitting: Internal Medicine

## 2021-12-15 DIAGNOSIS — M7741 Metatarsalgia, right foot: Secondary | ICD-10-CM | POA: Diagnosis not present

## 2021-12-15 DIAGNOSIS — M79671 Pain in right foot: Secondary | ICD-10-CM | POA: Diagnosis not present

## 2021-12-15 DIAGNOSIS — M2041 Other hammer toe(s) (acquired), right foot: Secondary | ICD-10-CM | POA: Diagnosis not present

## 2021-12-19 ENCOUNTER — Encounter: Payer: Self-pay | Admitting: Family Medicine

## 2021-12-19 ENCOUNTER — Ambulatory Visit: Payer: Medicare HMO | Admitting: Family Medicine

## 2021-12-28 ENCOUNTER — Telehealth: Payer: Self-pay | Admitting: Family Medicine

## 2021-12-28 DIAGNOSIS — E1169 Type 2 diabetes mellitus with other specified complication: Secondary | ICD-10-CM

## 2021-12-29 ENCOUNTER — Other Ambulatory Visit: Payer: Self-pay | Admitting: Family Medicine

## 2021-12-29 DIAGNOSIS — E1169 Type 2 diabetes mellitus with other specified complication: Secondary | ICD-10-CM

## 2021-12-29 MED ORDER — PRALUENT 75 MG/ML ~~LOC~~ SOAJ: 75.0000 mg | SUBCUTANEOUS | 12 refills | Status: DC

## 2021-12-29 NOTE — Telephone Encounter (Signed)
Refill sent in for praluent

## 2022-01-02 ENCOUNTER — Ambulatory Visit (INDEPENDENT_AMBULATORY_CARE_PROVIDER_SITE_OTHER): Payer: Medicare HMO | Admitting: Family Medicine

## 2022-01-02 ENCOUNTER — Encounter: Payer: Self-pay | Admitting: Family Medicine

## 2022-01-02 VITALS — BP 103/66 | HR 94 | Temp 97.9°F | Ht 65.0 in | Wt 244.8 lb

## 2022-01-02 DIAGNOSIS — E1159 Type 2 diabetes mellitus with other circulatory complications: Secondary | ICD-10-CM

## 2022-01-02 DIAGNOSIS — E1169 Type 2 diabetes mellitus with other specified complication: Secondary | ICD-10-CM | POA: Diagnosis not present

## 2022-01-02 DIAGNOSIS — I152 Hypertension secondary to endocrine disorders: Secondary | ICD-10-CM

## 2022-01-02 DIAGNOSIS — E785 Hyperlipidemia, unspecified: Secondary | ICD-10-CM

## 2022-01-02 LAB — BAYER DCA HB A1C WAIVED: HB A1C (BAYER DCA - WAIVED): 5.5 % (ref 4.8–5.6)

## 2022-01-02 NOTE — Progress Notes (Signed)
Subjective: CC:DM PCP: Janora Norlander, DO NUU:VOZDG Sara [name] is a 76 y.o. female presenting to clinic today for:  1. Type 2 Diabetes with hypertension, hyperlipidemia w/ CAD:  Compliant with Ozempic 0.5 mg each week.  She reports some nausea associated with this but no vomiting.  No abdominal pain.  She is very pleased with the weight loss that she has had with this medication.  Recently switched off of her oral cholesterol medication and onto Repatha.  She is doing extremely well with the Barber.  Last eye exam: UTD Last foot exam: needs Last A1c:  Lab Results  Component Value Date   HGBA1C 6.3 (H) 09/18/2021   Nephropathy screen indicated?: on ARB Last flu, zoster and/or pneumovax:  Immunization History  Administered Date(s) Administered   Fluad Quad(high Dose 65+) 05/04/2019, 05/25/2020, 06/13/2021   Influenza Whole 04/22/2009   Influenza, High Dose Seasonal PF 05/01/2018   Influenza,inj,Quad PF,6+ Mos 04/25/2015   Influenza-Unspecified 05/18/2014, 07/05/2016, 05/13/2017   Moderna Sars-Covid-2 Vaccination 10/12/2019, 11/09/2019, 06/01/2020, 01/04/2021   Pneumococcal Conjugate-13 06/15/2014   Pneumococcal Polysaccharide-23 04/22/2008, 05/04/2019   Tdap 04/22/2013, 01/11/2021   Zoster Recombinat (Shingrix) 02/24/2021, 04/26/2021   Zoster, Live 08/03/2010    ROS: No chest pain, shortness of breath, edema.   ROS: Per HPI  Allergies  Allergen Reactions   Latex Other (See Comments)   Nifedipine Other (See Comments)    Brings blood pressure up really fast PROCARDIA   Tape     bleeding   Codeine Itching   Crestor [Rosuvastatin] Other (See Comments)    myalgias   Past Medical History:  Diagnosis Date   Arthritis    Arthritis -hands -Bil. knee replacemnts   AVNRT (AV nodal re-entry tachycardia) ([place])    s/p RFCA 09/2011   Bronchitis    none recent   CAD (coronary artery disease)    a. s/p Xience DES x 3 to LAD;  b. nuc study 02/27/10: EF 67% no ischemia;   c.  echo 11/12: Mild LVH, EF 64-40%, grade 1 diastolic dysfunction, moderate LAE;  d.  LHC 07/03/11: LAD stents patent, RCA 25%, EF 55-65% ;  e. [place]. Myoview 5/14:  No ischemia, EF 68%   COVID-19 2021   Depression    Diverticular disease    GERD (gastroesophageal reflux disease)    Heart murmur    Hemorrhoid    Hepatitis    yellow jaundice as a child    HTN (hypertension)    Hx of echocardiogram    a.  Echo (05/2011):  Mild LVH, EF 55-60%, Gr 1 DD, mod LAE.b. Echo (11/14):  Mild LVH, mild focal basal septal hypertrophy, EF 55-60%, Gr 1 DD, mild LAE, atrial septal aneurysm   Hyperlipidemia    Hypertension    Impaired hearing    bilateral- no hearing aids   Jaundice    Myocardial infarction (HCC)    hx of x 2 3-4 years ago per pt   Obesity    OSA (obstructive sleep apnea)    needs no cpap per patient pt denies   Other psoriasis    ongoing- none at present.   Sleep apnea    Vertigo     Current Outpatient Medications:    albuterol (VENTOLIN HFA) 108 (90 Base) MCG/ACT inhaler, TAKE 2 PUFFS BY MOUTH EVERY 6 HOURS AS NEEDED FOR WHEEZE OR SHORTNESS OF BREATH (Patient taking differently: Inhale 2 puffs into the lungs every 6 (six) hours as needed for wheezing or shortness of breath.), Disp:  18 each, Rfl: 1   alendronate (FOSAMAX) 70 MG tablet, TAKE 1 TABLET EVERY 7 DAYS WITH A FULL GLASS OF WTER ON AN EMPTY STOMACH, Disp: 4 tablet, Rfl: 11   Alirocumab (PRALUENT) 75 MG/ML SOAJ, Inject 75 mg into the skin every 14 (fourteen) days., Disp: 4 mL, Rfl: 12   ALPRAZolam (XANAX) 0.5 MG tablet, TAKE 1 TABLET BY MOUTH TWICE A DAY AS NEEDED FOR ANXIETY, Disp: 60 tablet, Rfl: 0   aspirin EC 81 MG tablet, Take 81 mg by mouth daily., Disp: , Rfl:    betamethasone valerate ointment (VALISONE) 0.1 %, Apply 1 application topically 2 (two) times daily as needed (psoriasis). (DO NOT apply to face, groin or axilla) (Patient not taking: Reported on 09/15/2021), Disp: 45 g, Rfl: 2   budesonide-formoterol  (SYMBICORT) 80-4.5 MCG/ACT inhaler, Inhale 2 puffs into the lungs 2 (two) times daily., Disp: 1 each, Rfl: 3   Calcium Carbonate-Vitamin D (SUPER CALCIUM 600 + D3 PO), Take 1 tablet by mouth daily., Disp: , Rfl:    cyclobenzaprine (FLEXERIL) 5 MG tablet, TAKE 1 TABLET BY MOUTH THREE TIMES A DAY AS NEEDED FOR MUSCLE SPASMS (Patient not taking: Reported on 09/15/2021), Disp: 30 tablet, Rfl: 0   diltiazem (CARDIZEM CD) 120 MG 24 hr capsule, Take 1 capsule (120 mg total) by mouth daily., Disp: 30 capsule, Rfl: 2   fluticasone (FLONASE) 50 MCG/ACT nasal spray, USE 1 SPRAY INTO EACH NOSTRIL TWICE A DAY, Disp: 48 mL, Rfl: 1   furosemide (LASIX) 20 MG tablet, TAKE 1 TABLET BY MOUTH EVERY DAY, Disp: 30 tablet, Rfl: 4   losartan (COZAAR) 25 MG tablet, TAKE 1 TABLET BY MOUTH TWICE A DAY, Disp: 180 tablet, Rfl: 0   meclizine (ANTIVERT) 25 MG tablet, Take 1 tablet (25 mg total) by mouth 2 (two) times daily as needed for dizziness. Reported on 12/05/2015, Disp: 30 tablet, Rfl: 1   nitroGLYCERIN (NITROSTAT) 0.4 MG SL tablet, Place 1 tablet (0.4 mg total) under the tongue every 5 (five) minutes as needed for chest pain (x 3 doses). Reported on 12/05/2015, Disp: 25 tablet, Rfl: 1   omeprazole (PRILOSEC) 20 MG capsule, TAKE 1 CAPSULE BY MOUTH EVERY DAY, Disp: 90 capsule, Rfl: 0   potassium chloride (KLOR-CON) 10 MEQ tablet, TAKE 1 TABLET BY MOUTH EVERY DAY, Disp: 90 tablet, Rfl: 3   Semaglutide,0.25 or 0.5MG/DOS, (OZEMPIC, 0.25 OR 0.5 MG/DOSE,) 2 MG/1.5ML SOPN, Inject 0.25 mg into the skin every 7 (seven) days for 28 days, THEN 0.5 mg every 7 (seven) days., Disp: 4.5 mL, Rfl: 3   spironolactone (ALDACTONE) 25 MG tablet, TAKE 0.5 TABLETS BY MOUTH 2 TIMES DAILY., Disp: 90 tablet, Rfl: 0 Social History   Socioeconomic History   Marital status: Married    Spouse name: Mortimer Fries   Number of children: 3   Years of education: Not on file   Highest education level: Not on file  Occupational History   Occupation: hairdresser   Tobacco Use   Smoking status: Never   Smokeless tobacco: Never  Vaping Use   Vaping Use: Never used  Substance and Sexual Activity   Alcohol use: No   Drug use: No   Sexual activity: Not Currently    Birth control/protection: Post-menopausal  Other Topics Concern   Not on file  Social History Narrative   Lives with husband, Mortimer Fries. Married x 34 years in Oct 06, 2020.   1 son died in 2017/10/06 from [place].    Social Determinants of Radio broadcast assistant  Strain: Low Risk  (07/05/2021)   Overall Financial Resource Strain (CARDIA)    Difficulty of Paying Living Expenses: Not hard at all  Food Insecurity: No Food Insecurity (07/05/2021)   Hunger Vital Sign    Worried About Running Out of Food in the Last Year: Never true    Ran Out of Food in the Last Year: Never true  Transportation Needs: No Transportation Needs (07/05/2021)   PRAPARE - Hydrologist (Medical): No    Lack of Transportation (Non-Medical): No  Physical Activity: Insufficiently Active (07/05/2021)   Exercise Vital Sign    Days of Exercise per Week: 2 days    Minutes of Exercise per Session: 10 min  Stress: No Stress Concern Present (07/05/2021)   [place]    Feeling of Stress : Only a little  Social Connections: Socially Integrated (07/05/2021)   Social Connection and Isolation Panel [NHANES]    Frequency of Communication with Friends and Family: More than three times a week    Frequency of Social Gatherings with Friends and Family: More than three times a week    Attends Religious Services: More than 4 times per year    Active Member of [employer] or Organizations: Yes    Attends Music therapist: More than 4 times per year    Marital Status: Married  Human resources officer Violence: Not At Risk (07/05/2021)   Humiliation, Afraid, Rape, and Kick questionnaire    Fear of Current or Ex-Partner: No    Emotionally Abused: No     Physically Abused: No    Sexually Abused: No   Family History  Problem Relation Age of Onset   Stroke Mother    Hypertension Mother    Heart failure Mother    Breast cancer Mother    Aneurysm Father    Seizures Brother    Muscular dystrophy Daughter        dx as infant, passed age 29   Seizures Son    Colon cancer Neg Hx    Esophageal cancer Neg Hx    Rectal cancer Neg Hx    Stomach cancer Neg Hx     Objective: Office vital signs reviewed. BP 103/66   Pulse 94   Temp 97.9 F (36.6 C)   Ht '5\' 5"'  (1.651 m)   Wt 244 lb 12.8 oz (111 kg)   SpO2 94%   BMI 40.74 kg/m   Physical Examination:  General: Awake, alert, morbidly obese, No acute distress HEENT: Sclera white.  Moist mucous membranes Cardio: regular rate and rhythm, S1S2 heard, no murmurs appreciated Pulm: clear to auscultation bilaterally, no wheezes, rhonchi or rales; normal work of breathing on room air Neuro: see DM foot  Diabetic Foot Exam - Simple   Simple Foot Form Diabetic Foot exam was performed with the following findings: Yes 01/02/2022  9:10 AM  Visual Inspection See comments: Yes Sensation Testing See comments: Yes Pulse Check Posterior Tibialis and Dorsalis pulse intact bilaterally: Yes Comments Absent monofilament sensation of digits 2 through 4 on the right.  She has hammertoe of the third digit of the right foot as well.  Monofilament intact on the left.  Onychomycotic changes to the nails bilaterally    Assessment/ Plan: 76 y.o. female   Controlled type 2 diabetes mellitus with other specified complication, without long-term current use of insulin ([place]) - Plan: Bayer DCA Hb A1c Waived, CMP14+EGFR  Hypertension associated with diabetes ([place]) -  Plan: CMP14+EGFR  Morbid obesity ([place])  Hyperlipidemia associated with type 2 diabetes mellitus ([place]) - Plan: CMP14+EGFR  Sugar under excellent control with A1c of 6.5 today.  She has lost over 20 pounds since her last visit and continues to do  extremely well on this medication.  So we will continue Ozempic at current dose.  May consider advancing if needed going forward.  May follow-up in 4 to 6 months, sooner if concerns arise  Blood pressure is well controlled.  No changes  Currently treated with Repatha.  Check CMP.  We will plan for fasting lipid at next visit  No orders of the defined types were placed in this encounter.  No orders of the defined types were placed in this encounter.    Janora Norlander, DO [place] [phone]

## 2022-01-03 ENCOUNTER — Other Ambulatory Visit: Payer: Self-pay | Admitting: Family Medicine

## 2022-01-03 ENCOUNTER — Telehealth: Payer: Self-pay | Admitting: Pharmacist

## 2022-01-03 DIAGNOSIS — E1159 Type 2 diabetes mellitus with other circulatory complications: Secondary | ICD-10-CM

## 2022-01-03 DIAGNOSIS — E1169 Type 2 diabetes mellitus with other specified complication: Secondary | ICD-10-CM

## 2022-01-03 LAB — CMP14+EGFR
ALT: 13 IU/L (ref 0–32)
AST: 21 IU/L (ref 0–40)
Albumin/Globulin Ratio: 1.7 (ref 1.2–2.2)
Albumin: 3.8 g/dL (ref 3.7–4.7)
Alkaline Phosphatase: 57 IU/L (ref 44–121)
BUN/Creatinine Ratio: 14 (ref 12–28)
BUN: 11 mg/dL (ref 8–27)
Bilirubin Total: 1.1 mg/dL (ref 0.0–1.2)
CO2: 20 mmol/L (ref 20–29)
Calcium: 9.3 mg/dL (ref 8.7–10.3)
Chloride: 104 mmol/L (ref 96–106)
Creatinine, Ser: 0.81 mg/dL (ref 0.57–1.00)
Globulin, Total: 2.2 g/dL (ref 1.5–4.5)
Glucose: 104 mg/dL — ABNORMAL HIGH (ref 70–99)
Potassium: 4.4 mmol/L (ref 3.5–5.2)
Sodium: 139 mmol/L (ref 134–144)
Total Protein: 6 g/dL (ref 6.0–8.5)
eGFR: 75 mL/min/{1.73_m2} (ref >59)

## 2022-01-03 MED ORDER — PRALUENT 150 MG/ML ~~LOC~~ SOAJ: 150.0000 mg | SUBCUTANEOUS | 11 refills | Status: DC

## 2022-01-03 NOTE — Telephone Encounter (Signed)
Praluent called in to [facility] [address] Increase to '150mg'$ 

## 2022-01-11 ENCOUNTER — Telehealth: Payer: Self-pay | Admitting: Family Medicine

## 2022-01-17 ENCOUNTER — Other Ambulatory Visit: Payer: Self-pay | Admitting: Family Medicine

## 2022-01-17 DIAGNOSIS — E1159 Type 2 diabetes mellitus with other circulatory complications: Secondary | ICD-10-CM

## 2022-01-22 DIAGNOSIS — M7741 Metatarsalgia, right foot: Secondary | ICD-10-CM | POA: Diagnosis not present

## 2022-01-25 ENCOUNTER — Telehealth: Payer: Self-pay | Admitting: Family Medicine

## 2022-01-25 NOTE — Telephone Encounter (Signed)
Pt called requesting to speak to [place] only.  Wants Almyra Free to call her tomorrow morning (01/26/22)

## 2022-01-26 NOTE — Telephone Encounter (Signed)
Returned call Patient received az&me letter re: symbicort We will switch patient in December to breztri via az&me Will have to redo paperwork

## 2022-02-07 ENCOUNTER — Ambulatory Visit (INDEPENDENT_AMBULATORY_CARE_PROVIDER_SITE_OTHER): Payer: Medicare HMO | Admitting: Pharmacist

## 2022-02-07 DIAGNOSIS — E119 Type 2 diabetes mellitus without complications: Secondary | ICD-10-CM

## 2022-02-07 DIAGNOSIS — E1169 Type 2 diabetes mellitus with other specified complication: Secondary | ICD-10-CM

## 2022-02-07 NOTE — Progress Notes (Signed)
Chronic Care Management Pharmacy Note  02/07/2022 Name:  Sara [name] MRN:  [id number] DOB:  1945/12/29  Summary: Diabetes: -A1c 6.3% but weight loss/cardiac protection needed -will INCREASE Ozempic to 85m weekly -25 lb weight loss  -patient approved for novo nordisk patient assistance program  -will increase to 144mweekly when patient assistance supply arrives -Denies personal and family history of Medullary thyroid cancer (MTC)   HYPERLIPIDEMIA Uncontrolled; current treatment: PRALUENT Q14 DAYS Intolerances --> rosuvastatin, ATORVASTATIN 80MG TWICE WEEKLY (patient has stopped due to side effects-asked her to discuss with cardiologist);  Significant cardiac history; follows with cardiology who also recommended PCSK9 Repatha not covered on insurace  [place]ed [place] >[place]100/month (COVERED ON INSURANCE UNTIL 06/2022), HOWEVER patient would enter coverage gap CONTINUE PRALUENT [place] using grant foundation card for 1 year total Atorvastatin-patient has stopped, needs to discuss with cards HAVING MYOPATHY--DECREASE TO TWICE WEEKLY NOW ON PRALUENT Recheck lipids at PCP f/u next month  Lipid Panel     Component Value Date/Time   CHOL 147 09/19/2021 0828   TRIG 52 09/19/2021 0828   HDL 53 09/19/2021 0828   CHOLHDL 2.8 09/19/2021 0828   CHOLHDL 3.0 07/11/2016 1507   VLDL 13 07/11/2016 1507   LDLCALC 83 09/19/2021 0828   LABVLDL 11 09/19/2021 0828   Recommended continue praluent Assessed patient finances. APPLICATION FOR [employer] GRANT APPROVED --> GRANT RUNS OUT 06/2022  -Discussed with patient that praluent can help reduce her cardiac risk significantly while bringing LDL to goal -Control of T2DM/eating healthier diet will be very beneficial as well -->discussed lifestyle changes   Patient Goals/Self-Care Activities patient will:  - take medications as  prescribed collaborate with provider on medication access solutions  Follow Up Plan: Telephone follow up appointment with care management team member scheduled for: 3 MONTHS   Subjective: Sara [name] an [age].o. year old female who is a primary patient of GoJanora NorlanderDO.  The CCM team was consulted for assistance with disease management and care coordination needs.    Engaged with patient by telephone for follow up visit in response to provider referral for pharmacy case management and/or care coordination services.   Consent to Services:  The patient was given information about Chronic Care Management services, agreed to services, and gave verbal consent prior to initiation of services.  Please see initial visit note for detailed documentation.   Patient Care Team: GoJanora NorlanderDO as PCP - General (Family Medicine) Bensimhon, DaShaune PascalMD (Cardiology) AlGaynelle ArabianMD as Consulting Physician (Orthopedic Surgery) PrLavera GuiseRPAtl[facility] Ocean CountyPharmacist)   Objective:  Lab Results  Component Value Date   CREATININE 0.81 01/02/2022   CREATININE 0.63 01/20/2021   CREATININE 0.75 08/23/2020    Lab Results  Component Value Date   HGBA1C 5.5 01/02/2022   Last diabetic Eye exam:  Lab Results  Component Value Date/Time   HMDIABEYEEXA No Retinopathy 06/12/2021 12:00 AM    Last diabetic Foot exam: No results found for: "HMDIABFOOTEX"      Component Value Date/Time   CHOL 147 09/19/2021 0828   TRIG 52 09/19/2021 0828   HDL 53 09/19/2021 0828   CHOLHDL 2.8 09/19/2021 0828   CHOLHDL 3.0 07/11/2016 1507   VLDL 13 07/11/2016 1507   LDLCALC 83 09/19/2021 0828       Latest Ref Rng & Units 01/02/2022    9:33 AM 01/20/2021  5:20 PM 08/23/2020    8:15 AM  Hepatic Function  Total Protein 6.0 - 8.5 g/dL 6.0  6.5  6.1   Albumin 3.7 - 4.7 g/dL 3.8  3.5  3.7   AST 0 - 40 IU/L '21  29  16   ' ALT 0 - 32 IU/L '13  20  12   ' Alk Phosphatase 44 - 121 IU/L 57  54  72    Total Bilirubin 0.0 - 1.2 mg/dL 1.1  0.9  1.1     Lab Results  Component Value Date/Time   TSH 1.370 12/11/2019 01:53 PM   TSH 1.910 02/02/2019 10:15 AM       Latest Ref Rng & Units 01/20/2021    5:20 PM 02/02/2019   10:15 AM 04/08/2018    2:29 PM  CBC  WBC 4.0 - 10.5 K/uL 7.7  5.6  5.1   Hemoglobin 12.0 - 15.0 g/dL 13.8  14.6  14.1   Hematocrit 36.0 - 46.0 % 40.4  42.1  42.5   Platelets 150 - 400 K/uL 189  159  181     Lab Results  Component Value Date/Time   VD25OH 37.7 02/24/2021 09:01 AM   VD25OH 42.2 04/15/2020 03:31 PM    Clinical ASCVD: Yes  The ASCVD Risk score (Arnett DK, et al., 2019) failed to calculate for the following reasons:   The patient has a prior MI or stroke diagnosis    Other: (CHADS2VASc if Afib, PHQ9 if depression, MMRC or CAT for COPD, ACT, DEXA)  Social History   Tobacco Use  Smoking Status Never  Smokeless Tobacco Never   BP Readings from Last 3 Encounters:  01/02/22 103/66  09/18/21 124/71  09/15/21 128/80   Pulse Readings from Last 3 Encounters:  01/02/22 94  09/18/21 73  09/15/21 62   Wt Readings from Last 3 Encounters:  01/02/22 244 lb 12.8 oz (111 kg)  11/03/21 249 lb (112.9 kg)  09/18/21 264 lb 6.4 oz (119.9 kg)    Assessment: Review of patient past medical history, allergies, medications, health status, including review of consultants reports, laboratory and other test data, was performed as part of comprehensive evaluation and provision of chronic care management services.   SDOH:  (Social Determinants of Health) assessments and interventions performed:    CCM Care Plan  Allergies  Allergen Reactions   Latex Other (See Comments)   Nifedipine Other (See Comments)    Brings blood pressure up really fast PROCARDIA   Tape     bleeding   Codeine Itching   Crestor [Rosuvastatin] Other (See Comments)    myalgias    Medications Reviewed Today     Reviewed by Lavera Guise, [facility] (Pharmacist) on 02/07/22 at 1548  Med  List Status: <None>   Medication Order Taking? Sig Documenting Provider Last Dose Status Informant  albuterol (VENTOLIN HFA) 108 (90 Base) MCG/ACT inhaler [id number] No TAKE 2 PUFFS BY MOUTH EVERY 6 HOURS AS NEEDED FOR WHEEZE OR SHORTNESS OF BREATH  Patient taking differently: Inhale 2 puffs into the lungs every 6 (six) hours as needed for wheezing or shortness of breath.   Ronnie Doss M, DO Taking Active   alendronate (FOSAMAX) 70 MG tablet [id number] No TAKE 1 TABLET EVERY 7 DAYS WITH A FULL GLASS OF Virgel Paling ON AN EMPTY STOMACH Janora Norlander, DO Taking Active            Med Note Lavera Guise   Tue Apr 11, 2021  4:15 PM) Buddy Duty  on 04/19/20  Alirocumab (PRALUENT) 150 MG/ML SOAJ [id number]  Inject 150 mg into the skin every 14 (fourteen) days. Ronnie Doss M, DO  Active   ALPRAZolam Duanne Moron) 0.5 MG tablet [id number] No TAKE 1 TABLET BY MOUTH TWICE A DAY AS NEEDED FOR ANXIETY Evelina Dun A, FNP Taking Active   aspirin EC 81 MG tablet [id number] No Take 81 mg by mouth daily. [provider] Taking Active Self  betamethasone valerate ointment (VALISONE) 0.1 % [id number] No Apply 1 application topically 2 (two) times daily as needed (psoriasis). (DO NOT apply to face, groin or axilla) Janora Norlander, DO Taking Active   budesonide-formoterol (SYMBICORT) 80-4.5 MCG/ACT inhaler [id number] No Inhale 2 puffs into the lungs 2 (two) times daily. Janora Norlander, DO Taking Active Self           Med Note Parthenia Ames Aug 31, 2021  2:03 PM) Via [place] patient [place]   Calcium Carbonate-Vitamin D (SUPER CALCIUM 600 + D3 PO) [id number] No Take 1 tablet by mouth daily. [provider] Taking Active Self  cyclobenzaprine (FLEXERIL) 5 MG tablet [id number] No TAKE 1 TABLET BY MOUTH THREE TIMES A DAY AS NEEDED FOR MUSCLE SPASMS Sharion Balloon, FNP Taking Active   diltiazem (CARDIZEM CD) 120 MG 24 hr capsule [id number]  TAKE 1 CAPSULE BY MOUTH EVERY DAY  Ronnie Doss M, DO  Active   fluticasone (FLONASE) 50 MCG/ACT nasal spray [id number] No USE 1 SPRAY INTO EACH NOSTRIL TWICE A DAY Ronnie Doss M, DO Taking Active   furosemide (LASIX) 20 MG tablet [id number]  TAKE 1 TABLET BY MOUTH EVERY DAY Ronnie Doss M, DO  Active   losartan (COZAAR) 25 MG tablet [id number] No TAKE 1 TABLET BY MOUTH TWICE A DAY Gottschalk, Ashly M, DO Taking Active   meclizine (ANTIVERT) 25 MG tablet [id number] No Take 1 tablet (25 mg total) by mouth 2 (two) times daily as needed for dizziness. Reported on 12/05/2015 Sharion Balloon, FNP Taking Active Self  nitroGLYCERIN (NITROSTAT) 0.4 MG SL tablet [id number] No Place 1 tablet (0.4 mg total) under the tongue every 5 (five) minutes as needed for chest pain (x 3 doses). Reported on 12/05/2015 Janora Norlander, DO Taking Active   omeprazole (PRILOSEC) 20 MG capsule [id number] No TAKE 1 CAPSULE BY MOUTH EVERY DAY Ronnie Doss M, DO Taking Active   potassium chloride (KLOR-CON) 10 MEQ tablet [id number] No TAKE 1 TABLET BY MOUTH EVERY DAY Gottschalk, Ashly M, DO Taking Active   Semaglutide,0.25 or 0.5MG/DOS, (OZEMPIC, 0.25 OR 0.5 MG/DOSE,) 2 MG/1.5ML SOPN [id number] No Inject 0.25 mg into the skin every 7 (seven) days for 28 days, THEN 0.5 mg every 7 (seven) days.  Patient taking differently: Inject 0.5 mg every 7 (seven) days. Will increase to 22m weekly when supply arrives at PCP office   GJanora Norlander DO Taking Active            Med Note (Blanca Friend JRoyce Macadamia  Tue Nov 07, 2021 11:46 AM) Via novo nordisk patient assistance program    spironolactone (ALDACTONE) 25 MG tablet [id number] TAKE 1/2 TABLET BY MOUTH TWICE A DAY GJanora Norlander DO  Active             Patient Active Problem List   Diagnosis Date Noted   Diabetes mellitus type II, controlled ([place] 08/23/2020   Hyperlipidemia associated with type 2 diabetes mellitus ([place] 08/27/2018   Morbid obesity ([place] 08/27/2018   Chronic  left-sided low  back pain with left-sided sciatica 05/10/2017   Coronary artery disease involving native coronary artery of native heart without angina pectoris 03/20/2017   LBBB (left bundle branch block) 03/20/2017   Age related osteoporosis 10/17/2016   Acute rhinosinusitis 10/17/2016   Primary osteoarthritis of right hip 02/23/2016   OA (osteoarthritis) of knee 12/22/2012   AVNRT (AV nodal re-entry tachycardia) ([place])    Atrial arrhythmia 07/03/2011   OBSTRUCTIVE SLEEP APNEA 03/15/2010   EDEMA 02/28/2009   Hypertension associated with diabetes ([place]) 12/09/2008   Coronary atherosclerosis 12/09/2008   HEMORRHOIDS 12/09/2008   GERD 12/09/2008   PSORIASIS 12/09/2008   Arthritis 12/09/2008   VERTIGO 12/09/2008   CARDIAC MURMUR 12/09/2008    Immunization History  Administered Date(s) Administered   Fluad Quad(high Dose 65+) 05/04/2019, 05/25/2020, 06/13/2021   Influenza Whole 04/22/2009   Influenza, High Dose Seasonal PF 05/01/2018   Influenza,inj,Quad PF,6+ Mos 04/25/2015   Influenza-Unspecified 05/18/2014, 07/05/2016, 05/13/2017   Moderna Sars-Covid-2 Vaccination 10/12/2019, 11/09/2019, 06/01/2020, 01/04/2021   Pneumococcal Conjugate-13 06/15/2014   Pneumococcal Polysaccharide-23 04/22/2008, 05/04/2019   Tdap 04/22/2013, 01/11/2021   Zoster Recombinat (Shingrix) 02/24/2021, 04/26/2021   Zoster, Live 08/03/2010    Conditions to be addressed/monitored: HLD and DMII  Care Plan : PHARMD MEDICATION MANAGEMENT  Updates made by Lavera Guise, RPH since 02/07/2022 12:00 AM     Problem: DIESEASE PROGRESSION PREVENTION      Long-Range Goal: HYPERLIPIDEMIA, T2DM   Recent Progress: On track  Priority: High  Note:   Current Barriers:  Unable to achieve control of CHOLESTEROL & T2DM/BLOOD SUGAR   Pharmacist Clinical Goal(s):  patient will verbalize ability to afford treatment regimen achieve control of CHOLESTEROL as evidenced by    through collaboration with PharmD and provider.    Interventions: 1:1 collaboration with Janora Norlander, DO regarding development and update of comprehensive plan of care as evidenced by provider attestation and co-signature Inter-disciplinary care team collaboration (see longitudinal plan of care) Comprehensive medication review performed; medication list updated in electronic medical record   Diabetes: -A1c 6.3% but weight loss/cardiac protection needed -will INCREASE Ozempic to 80m weekly -25 lb weight loss  -patient approved for novo nordisk patient assistance program  -will increase to 131mweekly when patient assistance supply arrives -Denies personal and family history of Medullary thyroid cancer (MTC)   HYPERLIPIDEMIA Uncontrolled; current treatment: PRALUENT Q14 DAYS Intolerances --> rosuvastatin, ATORVASTATIN 80MG TWICE WEEKLY (patient has stopped due to side effects-asked her to discuss with cardiologist);  Significant cardiac history; follows with cardiology who also recommended PCSK9 Repatha not covered on insurace  [place]/month (COVERED ON INSURANCE UNTIL 06/2022), HOWEVER patient would enter coverage gap CONTINUE PRALUENT Tolerating well [place] cop[place] using grant foundation card for 1 year total Atorvastatin-patient has stopped, needs to discuss with cards HAVING MYOPATHY--DECREASE TO TWICE WEEKLY NOW ON PRALUENT Recheck lipids at PCP f/u next month  Lipid Panel     Component Value Date/Time   CHOL 147 09/19/2021 0828   TRIG 52 09/19/2021 0828   HDL 53 09/19/2021 0828   CHOLHDL 2.8 09/19/2021 0828   CHOLHDL 3.0 07/11/2016 1507   VLDL 13 07/11/2016 1507   LDLCALC 83 09/19/2021 0828   LABVLDL 11 09/19/2021 0828  Recommended continue praluent Assessed patient finances. APPLICATION FOR [employer] GRANT APPROVED --> GRANT RUNS OUT 06/2022  -Discussed with patient that praluent can help reduce her cardiac risk  significantly while bringing LDL to  goal -Control of T2DM/eating healthier diet will be very beneficial as well -->discussed lifestyle changes   Patient Goals/Self-Care Activities patient will:  - take medications as prescribed collaborate with provider on medication access solutions  Follow Up Plan: Telephone follow up appointment with care management team member scheduled for: 3 MONTHS      Follow Up:  Patient agrees to Care Plan and Follow-up.  Plan: Telephone follow up appointment with care management team member scheduled for:  1 month   Regina Eck, PharmD, BCPS Clinical Pharmacist, [place]  II Phone [phone]

## 2022-02-07 NOTE — Patient Instructions (Signed)
Visit Information  Following are the goals we discussed today:  Current Barriers:  Unable to achieve control of CHOLESTEROL & T2DM/BLOOD SUGAR   Pharmacist Clinical Goal(s):  patient will verbalize ability to afford treatment regimen achieve control of CHOLESTEROL as evidenced by  through collaboration with PharmD and provider.   Interventions: 1:1 collaboration with Janora Norlander, DO regarding development and update of comprehensive plan of care as evidenced by provider attestation and co-signature Inter-disciplinary care team collaboration (see longitudinal plan of care) Comprehensive medication review performed; medication list updated in electronic medical record   Diabetes: -A1c 6.3% but weight loss/cardiac protection needed -will INCREASE Ozempic to '1mg'$  weekly -25 lb weight loss  -patient approved for novo nordisk patient assistance program  -will increase to '1mg'$  weekly when patient assistance supply arrives -Denies personal and family history of Medullary thyroid cancer (MTC)   HYPERLIPIDEMIA Uncontrolled; current treatment: PRALUENT Q14 DAYS Intolerances --> rosuvastatin, ATORVASTATIN '80MG'$  TWICE WEEKLY (patient has stopped due to side effects-asked her to discuss with cardiologist);  Significant cardiac history; follows with cardiology who also recommended PCSK9 Repatha not covered on insurace  Called in [place] but over >$100/month (COVERED ON INSURANCE UNTIL 06/2022), HOWEVER patient would enter coverage gap CONTINUE PRALUENT Tolerating well [place]! $0 copay using grant foundation card for 1 year total Atorvastatin-patient has stopped, needs to discuss with cards HAVING MYOPATHY--DECREASE TO TWICE WEEKLY NOW ON PRALUENT Recheck lipids at PCP f/u next month  Lipid Panel     Component Value Date/Time   CHOL 147 09/19/2021 0828   TRIG 52 09/19/2021 0828   HDL 53 09/19/2021 0828   CHOLHDL 2.8 09/19/2021 0828    CHOLHDL 3.0 07/11/2016 1507   VLDL 13 07/11/2016 1507   LDLCALC 83 09/19/2021 0828   LABVLDL 11 09/19/2021 0828   Recommended continue praluent Assessed patient finances. APPLICATION FOR [employer] GRANT APPROVED --> GRANT RUNS OUT 06/2022  -Discussed with patient that praluent can help reduce her cardiac risk significantly while bringing LDL to goal -Control of T2DM/eating healthier diet will be very beneficial as well -->discussed lifestyle changes   Patient Goals/Self-Care Activities patient will:  - take medications as prescribed collaborate with provider on medication access solutions  Follow Up Plan: Telephone follow up appointment with care management team member scheduled for: 3 MONTHS   Plan: Telephone follow up appointment with care management team member scheduled for:  1 month  Signature Regina Eck, PharmD, BCPS Clinical Pharmacist, [place]  II Phone [phone]   Please call the care guide team at [phone] if you need to cancel or reschedule your appointment.   The patient verbalized understanding of instructions, educational materials, and care plan provided today and DECLINED offer to receive copy of patient instructions, educational materials, and care plan.

## 2022-02-14 ENCOUNTER — Other Ambulatory Visit: Payer: Self-pay | Admitting: Family Medicine

## 2022-02-19 DIAGNOSIS — E785 Hyperlipidemia, unspecified: Secondary | ICD-10-CM | POA: Diagnosis not present

## 2022-02-19 DIAGNOSIS — E119 Type 2 diabetes mellitus without complications: Secondary | ICD-10-CM | POA: Diagnosis not present

## 2022-02-19 DIAGNOSIS — E1169 Type 2 diabetes mellitus with other specified complication: Secondary | ICD-10-CM | POA: Diagnosis not present

## 2022-02-20 ENCOUNTER — Telehealth: Payer: Self-pay | Admitting: *Deleted

## 2022-02-20 NOTE — Telephone Encounter (Signed)
Pt assistance meds here - pt has no VM, NA on home #  #5 ozempic pens here for pick up == NA or VM on husbands cell # either.

## 2022-02-21 NOTE — Telephone Encounter (Signed)
Call to patient They will pick up today

## 2022-03-03 ENCOUNTER — Other Ambulatory Visit: Payer: Self-pay | Admitting: Family Medicine

## 2022-03-04 ENCOUNTER — Other Ambulatory Visit: Payer: Self-pay | Admitting: Family Medicine

## 2022-03-14 ENCOUNTER — Other Ambulatory Visit: Payer: Self-pay | Admitting: Family Medicine

## 2022-03-14 DIAGNOSIS — J019 Acute sinusitis, unspecified: Secondary | ICD-10-CM

## 2022-03-19 ENCOUNTER — Telehealth: Payer: Self-pay | Admitting: Family Medicine

## 2022-03-19 NOTE — Telephone Encounter (Signed)
Ozempic patient assistance received and patient aware.

## 2022-03-20 NOTE — Progress Notes (Unsigned)
Cardiology Office Note   Date:  03/21/2022   ID:  Sara [name] April 11, 1946, MRN [id number]  PCP:  Janora Norlander, DO  Cardiologist:   Minus Breeding, MD  Referring:  Janora Norlander, DO   CC:  SOB    History of Present Illness: Sara [name] is a 76 y.o. female who presents for follow up of CAD and AVNRT.  She had PCI to the LAD in 10/10.  In 12/12, she was admitted to [facility] with SVT and associated chest pain and elevated troponin.  Cath in 12/12 showed patent LAD stents. She had a right and a left heart cath in 2015 for dyspnea.  She had patent arteries.  She had normal right heart pressures.    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  She had Adenosine Cardiolite in 08/09/16  with no evidence for ischemia or infarction.     Since I last saw her she has had no new cardiovascular complaints.  She does stuff around the church but does not sound like she is very physically active.  With the level of activity she does do The patient denies any new symptoms such as chest discomfort, neck or arm discomfort. There has been no new shortness of breath, PND or orthopnea. There have been no reported palpitations, presyncope or syncope.   Past Medical History:  Diagnosis Date   Arthritis    Arthritis -hands -Bil. knee replacemnts   AVNRT (AV nodal re-entry tachycardia) ([place])    s/p RFCA 09/2011   Bronchitis    none recent   CAD (coronary artery disease)    a. s/p Xience DES x 3 to LAD;  b. nuc study 02/27/10: EF 67% no ischemia;  c.  echo 11/12: Mild LVH, EF 18-84%, grade 1 diastolic dysfunction, moderate LAE;  d.  LHC 07/03/11: LAD stents patent, RCA 25%, EF 55-65% ;  e. Adeno. Myoview 5/14:  No ischemia, EF 68%   COVID-19 2021   Depression    Diverticular disease    GERD (gastroesophageal reflux disease)    Hemorrhoid    Hepatitis    yellow jaundice as a child    HTN (hypertension)    Hyperlipidemia    Hypertension    Impaired hearing    bilateral- no hearing  aids   Myocardial infarction (HCC)    hx of x 2 3-4 years ago per pt   Obesity    OSA (obstructive sleep apnea)    needs no cpap per patient pt denies   Other psoriasis    ongoing- none at present.   Vertigo     Past Surgical History:  Procedure Laterality Date   ABDOMINAL HYSTERECTOMY     BACK SURGERY     Dr. Lawernce Pitts   CARDIAC CATHETERIZATION  08/23/2011   Ablation AV  Node   CATARACT EXTRACTION Bilateral    COLONOSCOPY     CORONARY ANGIOPLASTY     with stents 2009    KNEE SURGERY     right   LEFT AND RIGHT HEART CATHETERIZATION WITH CORONARY ANGIOGRAM N/A 08/18/2013   Procedure: LEFT AND RIGHT HEART CATHETERIZATION WITH CORONARY ANGIOGRAM;  Surgeon: Larey Dresser, MD;  Location: [facility] CATH LAB;  Service: Cardiovascular;  Laterality: N/A;   LEFT HEART CATHETERIZATION WITH CORONARY ANGIOGRAM N/A 07/03/2011   Procedure: LEFT HEART CATHETERIZATION WITH CORONARY ANGIOGRAM;  Surgeon: Minus Breeding, MD;  Location: [facility] CATH LAB;  Service: Cardiovascular;  Laterality: N/A;   REPAIR RECTOCELE  SUPRAVENTRICULAR TACHYCARDIA ABLATION N/A 08/23/2011   Procedure: SUPRAVENTRICULAR TACHYCARDIA ABLATION;  Surgeon: Evans Lance, MD;  Location: [facility] CATH LAB;  Service: Cardiovascular;  Laterality: N/A;   TOTAL HIP ARTHROPLASTY Right 02/23/2016   Procedure: RIGHT TOTAL HIP ARTHROPLASTY ANTERIOR APPROACH;  Surgeon: Rod Can, MD;  Location: [facility] ORS;  Service: Orthopedics;  Laterality: Right;   TOTAL KNEE ARTHROPLASTY Left 12/22/2012   Procedure: LEFT TOTAL KNEE ARTHROPLASTY;  Surgeon: Gearlean Alf, MD;  Location: [facility] ORS;  Service: Orthopedics;  Laterality: Left;   TUBAL LIGATION     UPPER GASTROINTESTINAL ENDOSCOPY       Current Outpatient Medications  Medication Sig Dispense Refill   albuterol (VENTOLIN HFA) 108 (90 Base) MCG/ACT inhaler TAKE 2 PUFFS BY MOUTH EVERY 6 HOURS AS NEEDED FOR WHEEZE OR SHORTNESS OF BREATH (Patient taking differently: Inhale 2 puffs into the lungs every 6  (six) hours as needed for wheezing or shortness of breath.) 18 each 1   alendronate (FOSAMAX) 70 MG tablet TAKE 1 TABLET EVERY 7 DAYS WITH A FULL GLASS OF WATER ON AN EMPTY STOMACH 4 tablet 3   Alirocumab (PRALUENT) 150 MG/ML SOAJ Inject 150 mg into the skin every 14 (fourteen) days. 2 mL 11   ALPRAZolam (XANAX) 0.5 MG tablet TAKE 1 TABLET BY MOUTH TWICE A DAY AS NEEDED FOR ANXIETY 60 tablet 0   aspirin EC 81 MG tablet Take 81 mg by mouth daily.     betamethasone valerate ointment (VALISONE) 0.1 % Apply 1 application topically 2 (two) times daily as needed (psoriasis). (DO NOT apply to face, groin or axilla) 45 g 2   budesonide-formoterol (SYMBICORT) 80-4.5 MCG/ACT inhaler Inhale 2 puffs into the lungs 2 (two) times daily. 1 each 3   Calcium Carbonate-Vitamin D (SUPER CALCIUM 600 + D3 PO) Take 1 tablet by mouth daily.     cyclobenzaprine (FLEXERIL) 5 MG tablet TAKE 1 TABLET BY MOUTH THREE TIMES A DAY AS NEEDED FOR MUSCLE SPASMS 30 tablet 0   diltiazem (CARDIZEM CD) 120 MG 24 hr capsule TAKE 1 CAPSULE BY MOUTH EVERY DAY 90 capsule 1   fluticasone (FLONASE) 50 MCG/ACT nasal spray SPRAY 1 SPRAY INTO EACH NOSTRIL TWICE A DAY 48 mL 1   furosemide (LASIX) 20 MG tablet TAKE 1 TABLET BY MOUTH EVERY DAY 90 tablet 1   losartan (COZAAR) 25 MG tablet TAKE 1 TABLET BY MOUTH TWICE A DAY 180 tablet 1   meclizine (ANTIVERT) 25 MG tablet Take 1 tablet (25 mg total) by mouth 2 (two) times daily as needed for dizziness. Reported on 12/05/2015 30 tablet 1   nitroGLYCERIN (NITROSTAT) 0.4 MG SL tablet Place 1 tablet (0.4 mg total) under the tongue every 5 (five) minutes as needed for chest pain (x 3 doses). Reported on 12/05/2015 25 tablet 1   omeprazole (PRILOSEC) 20 MG capsule TAKE 1 CAPSULE BY MOUTH EVERY DAY 90 capsule 0   potassium chloride (KLOR-CON) 10 MEQ tablet TAKE 1 TABLET BY MOUTH EVERY DAY 90 tablet 3   Semaglutide,0.25 or 0.'5MG'$ /DOS, (OZEMPIC, 0.25 OR 0.5 MG/DOSE,) 2 MG/1.5ML SOPN Inject 0.25 mg into the skin  every 7 (seven) days for 28 days, THEN 0.5 mg every 7 (seven) days. (Patient taking differently: Inject 0.5 mg every 7 (seven) days. Will increase to '1mg'$  weekly when supply arrives at PCP office) 4.5 mL 3   spironolactone (ALDACTONE) 25 MG tablet TAKE 1/2 TABLET BY MOUTH TWICE A DAY 90 tablet 1   No current facility-administered medications for this visit.  Allergies:   Latex, Nifedipine, Tape, Codeine, and Crestor [rosuvastatin]    ROS:  Please see the history of present illness.   Otherwise, review of systems are positive for none.   All other systems are reviewed and negative.    PHYSICAL EXAM: VS:  BP 110/70   Pulse 85   Ht '5\' 5"'$  (1.651 m)   Wt 228 lb (103.4 kg)   BMI 37.94 kg/m  , BMI Body mass index is 37.94 kg/m.  GENERAL:  Well appearing NECK:  No jugular venous distention, waveform within normal limits, carotid upstroke brisk and symmetric, no bruits, no thyromegaly LUNGS:  Clear to auscultation bilaterally CHEST:  Unremarkable HEART:  PMI not displaced or sustained,S1 and S2 within normal limits, no S3, no S4, no clicks, no rubs, soft brief apical systolic murmurs ABD:  Flat, positive bowel sounds normal in frequency in pitch, no bruits, no rebound, no guarding, no midline pulsatile mass, no hepatomegaly, no splenomegaly EXT:  2 plus pulses throughout, no edema, no cyanosis no clubbing   EKG:  EKG is  ordered today. Sinus rhythm, rate 85, left axis deviation, interventricular conduction delay.  No change from previous.  Recent Labs: 01/02/2022: ALT 13; BUN 11; Creatinine, Ser 0.81; Potassium 4.4; Sodium 139    Lipid Panel    Component Value Date/Time   CHOL 147 09/19/2021 0828   TRIG 52 09/19/2021 0828   HDL 53 09/19/2021 0828   CHOLHDL 2.8 09/19/2021 0828   CHOLHDL 3.0 07/11/2016 1507   VLDL 13 07/11/2016 1507   LDLCALC 83 09/19/2021 0828      Wt Readings from Last 3 Encounters:  03/21/22 228 lb (103.4 kg)  01/02/22 244 lb 12.8 oz (111 kg)  11/03/21 249  lb (112.9 kg)      Other studies Reviewed: Additional studies/ records that were reviewed today include: Labs Review of the above records demonstrates: See elsewhere   ASSESSMENT AND PLAN:   CAD:   The patient has no new sypmtoms.  No further cardiovascular testing is indicated.  We will continue with aggressive risk reduction and meds as listed.  AVNRT: She has no further arrhythmias.  No change in therapy.  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  No change in therapy.   DYSLIPIDEMIA: Her LDL is down to 83 on Praluent.   Since she is lost the weight this can be repeated.  If she is not at target I might suggest adding Zetia 10 mg.  HTN:   Her BP is at target.  No change in therapy.   Current medicines are reviewed at length with the patient today.  The patient does not have concerns regarding medicines.  The following changes have been made:    None  Labs/ tests ordered today include: None  Orders Placed This Encounter  Procedures   EKG 12-Lead      Disposition:   FU with me in 12 months.     Signed, Minus Breeding, MD  03/21/2022 3:16 PM    [facility]

## 2022-03-21 ENCOUNTER — Encounter: Payer: Self-pay | Admitting: Cardiology

## 2022-03-21 ENCOUNTER — Ambulatory Visit: Payer: Medicare HMO | Admitting: Cardiology

## 2022-03-21 VITALS — BP 110/70 | HR 85 | Ht 65.0 in | Wt 228.0 lb

## 2022-03-21 DIAGNOSIS — I471 Supraventricular tachycardia: Secondary | ICD-10-CM

## 2022-03-21 DIAGNOSIS — I2510 Atherosclerotic heart disease of native coronary artery without angina pectoris: Secondary | ICD-10-CM | POA: Diagnosis not present

## 2022-03-21 DIAGNOSIS — I447 Left bundle-branch block, unspecified: Secondary | ICD-10-CM

## 2022-03-21 DIAGNOSIS — I10 Essential (primary) hypertension: Secondary | ICD-10-CM | POA: Diagnosis not present

## 2022-03-21 DIAGNOSIS — R0602 Shortness of breath: Secondary | ICD-10-CM

## 2022-03-21 DIAGNOSIS — E785 Hyperlipidemia, unspecified: Secondary | ICD-10-CM | POA: Diagnosis not present

## 2022-03-21 NOTE — Patient Instructions (Signed)
Medication Instructions:  The current medical regimen is effective;  continue present plan and medications.  *If you need a refill on your cardiac medications before your next appointment, please call your pharmacy*  Follow-Up: At [facility], you and your health needs are our priority.  As part of our continuing mission to provide you with exceptional heart care, we have created designated Provider Care Teams.  These Care Teams include your primary Cardiologist (physician) and Advanced Practice Providers (APPs -  Physician Assistants and Nurse Practitioners) who all work together to provide you with the care you need, when you need it.  We recommend signing up for the patient portal called "MyChart".  Sign up information is provided on this After Visit Summary.  MyChart is used to connect with patients for Virtual Visits (Telemedicine).  Patients are able to view lab/test results, encounter notes, upcoming appointments, etc.  Non-urgent messages can be sent to your provider as well.   To learn more about what you can do with MyChart, go to [email or web address].    Your next appointment:   1 year(s)  The format for your next appointment:   In Person  Provider:   James Hochrein, MD     Important Information About Sugar       

## 2022-05-07 ENCOUNTER — Telehealth: Payer: Self-pay | Admitting: Cardiology

## 2022-05-07 ENCOUNTER — Telehealth: Payer: Self-pay | Admitting: Family Medicine

## 2022-05-07 DIAGNOSIS — I2510 Atherosclerotic heart disease of native coronary artery without angina pectoris: Secondary | ICD-10-CM

## 2022-05-07 DIAGNOSIS — E1169 Type 2 diabetes mellitus with other specified complication: Secondary | ICD-10-CM

## 2022-05-07 NOTE — Telephone Encounter (Signed)
Pt c/o medication issue:  1. Name of Medication:   Alirocumab (PRALUENT) 150 MG/ML SOAJ  2. How are you currently taking this medication (dosage and times per day)?   As prescribed  3. Are you having a reaction (difficulty breathing--STAT)?   No  4. What is your medication issue?   Patient stated that her insurance [employer] (hist[employer]ibility and immunogenetics)) will no longer be covering this medication and it is too expensive.  Patient would like to get an alternate medication.

## 2022-05-07 NOTE — Telephone Encounter (Signed)
Pt called requesting to speak with Sara [name]. Says her insurance is going to stop paying for her cholesterol shot and she needs advise on what her options are.

## 2022-05-07 NOTE — Telephone Encounter (Signed)
Patient voiced mail isf ull.

## 2022-05-07 NOTE — Telephone Encounter (Signed)
Spoke to patient .  She staes she received a letter from her insurance agent - Clayborn Bigness will not be covered for 2024.   Patient states her agent name is Julious Payer- [phone]- will know if another medication is on formulary.   This medication had been prescribed by her primary Dr Lajuana Ripple.   RN informed patient will call Mr Geoffry Paradise. The patient will need to call primary to give them the information.Patient is aware will call her withemore information.   RN called Mr Geoffry Paradise- Mr Geoffry Paradise will look up the alternate and contact RN back.

## 2022-05-07 NOTE — Telephone Encounter (Signed)
Received a phone call - Sara [name] patient insurance agent-  He states  Repatha is on the patient formulary for the upcoming 2024.   Patient will need a new prescription starting Jan 2024. She can continue with Praluent until then. She will need to contact  primary Dr Sara [name] - who prescribed the medication

## 2022-05-07 NOTE — Telephone Encounter (Signed)
Called spoke to husband . She states patient is still not at home.  RN left message with Husband-  Repatha  is covered on the patient new formulary as of 2024 . Patient will need primary to change  medication

## 2022-05-07 NOTE — Telephone Encounter (Signed)
Spoke to patient's husband. He states she is not home -- call @ 4 pm .

## 2022-05-08 ENCOUNTER — Telehealth: Payer: Self-pay | Admitting: Family Medicine

## 2022-05-08 ENCOUNTER — Other Ambulatory Visit: Payer: Self-pay | Admitting: Family Medicine

## 2022-05-08 DIAGNOSIS — I2510 Atherosclerotic heart disease of native coronary artery without angina pectoris: Secondary | ICD-10-CM

## 2022-05-08 DIAGNOSIS — J302 Other seasonal allergic rhinitis: Secondary | ICD-10-CM

## 2022-05-08 DIAGNOSIS — Z1231 Encounter for screening mammogram for malignant neoplasm of breast: Secondary | ICD-10-CM

## 2022-05-08 MED ORDER — MOMETASONE FUROATE 50 MCG/ACT NA SUSP: 2.0000 | Freq: Every day | NASAL | 12 refills | Status: DC

## 2022-05-08 NOTE — Telephone Encounter (Signed)
I'm fine with this change to repatha if patient is.

## 2022-05-08 NOTE — Progress Notes (Signed)
Meds ordered this encounter  Medications   mometasone (NASONEX) 50 MCG/ACT nasal spray    Sig: Place 2 sprays into the nose daily.    Dispense:  1 each    Refill:  12   Medications Discontinued During This Encounter  Medication Reason   fluticasone (FLONASE) 50 MCG/ACT nasal spray Change in therapy

## 2022-05-08 NOTE — Telephone Encounter (Signed)
I Sara [name] already about getting her switched over to [place].  I have replaced her Flonase with Nasonex.  Rx sent to local pharmacy

## 2022-05-08 NOTE — Telephone Encounter (Signed)
Pt wants to switch to repatha due to insurance not covering praluent- is there an alternative to send in for flonase?

## 2022-05-08 NOTE — Telephone Encounter (Signed)
Pt aware.

## 2022-05-09 ENCOUNTER — Other Ambulatory Visit: Payer: Self-pay | Admitting: Family Medicine

## 2022-05-09 DIAGNOSIS — I2510 Atherosclerotic heart disease of native coronary artery without angina pectoris: Secondary | ICD-10-CM

## 2022-05-09 DIAGNOSIS — E1169 Type 2 diabetes mellitus with other specified complication: Secondary | ICD-10-CM

## 2022-05-09 MED ORDER — REPATHA SURECLICK 140 MG/ML ~~LOC~~ SOAJ: 140.0000 mg | SUBCUTANEOUS | 5 refills | Status: DC

## 2022-05-09 NOTE — Telephone Encounter (Signed)
Almyra Free has already taken care of this change.

## 2022-05-09 NOTE — Telephone Encounter (Signed)
Please let patient know we have called in [place] to replace her Praleunt for cholesterol Its very similar and injected every two weeks (just as praluent) Its an easy auto-injector Keep refrigerated  Still use your [place] card to pay for at the pharmacy We will still have to do a PA

## 2022-05-09 NOTE — Telephone Encounter (Signed)
Pt husband aware

## 2022-05-10 MED ORDER — PRALUENT 150 MG/ML ~~LOC~~ SOAJ: 150.0000 mg | SUBCUTANEOUS | 2 refills | Status: DC

## 2022-05-10 NOTE — Telephone Encounter (Signed)
Insurance won't pay for [place] until 07/2022 Will call in Praluent '150mg'$  sq q 14 days to continue until end of 06/2022 Patient enrolled in [employer] for [place] (she can use for Repatha as well) Will need to re-enroll 2024 Patient to reach out to PharmD when grant expires and repatha is needed

## 2022-05-10 NOTE — Addendum Note (Signed)
Addended by: Lottie Dawson D on: 05/10/2022 11:50 AM   Modules accepted: Orders

## 2022-05-15 NOTE — Telephone Encounter (Signed)
Pt aware- states when she tries to pick up praluent it is over 100$. She thought she was under a grant?? Pt states she would like to speak with you personally

## 2022-05-15 NOTE — Telephone Encounter (Signed)
Pt has further questions about this message. She is asking when will she be able to take Alirocumab (PRALUENT) 150 MG/ML SOAJ rx-when will it be available? Please call back

## 2022-06-02 DIAGNOSIS — M810 Age-related osteoporosis without current pathological fracture: Secondary | ICD-10-CM | POA: Diagnosis not present

## 2022-06-02 DIAGNOSIS — Z008 Encounter for other general examination: Secondary | ICD-10-CM | POA: Diagnosis not present

## 2022-06-02 DIAGNOSIS — E11620 Type 2 diabetes mellitus with diabetic dermatitis: Secondary | ICD-10-CM | POA: Diagnosis not present

## 2022-06-02 DIAGNOSIS — Z7982 Long term (current) use of aspirin: Secondary | ICD-10-CM | POA: Diagnosis not present

## 2022-06-02 DIAGNOSIS — I2510 Atherosclerotic heart disease of native coronary artery without angina pectoris: Secondary | ICD-10-CM | POA: Diagnosis not present

## 2022-06-02 DIAGNOSIS — I110 Hypertensive heart disease with heart failure: Secondary | ICD-10-CM | POA: Diagnosis not present

## 2022-06-02 DIAGNOSIS — I509 Heart failure, unspecified: Secondary | ICD-10-CM | POA: Diagnosis not present

## 2022-06-02 DIAGNOSIS — R32 Unspecified urinary incontinence: Secondary | ICD-10-CM | POA: Diagnosis not present

## 2022-06-02 DIAGNOSIS — K219 Gastro-esophageal reflux disease without esophagitis: Secondary | ICD-10-CM | POA: Diagnosis not present

## 2022-06-02 DIAGNOSIS — E1151 Type 2 diabetes mellitus with diabetic peripheral angiopathy without gangrene: Secondary | ICD-10-CM | POA: Diagnosis not present

## 2022-06-02 DIAGNOSIS — E261 Secondary hyperaldosteronism: Secondary | ICD-10-CM | POA: Diagnosis not present

## 2022-06-02 DIAGNOSIS — I429 Cardiomyopathy, unspecified: Secondary | ICD-10-CM | POA: Diagnosis not present

## 2022-06-04 ENCOUNTER — Other Ambulatory Visit: Payer: Self-pay | Admitting: Family Medicine

## 2022-06-08 ENCOUNTER — Telehealth: Payer: Self-pay | Admitting: Pharmacist

## 2022-06-08 ENCOUNTER — Telehealth: Payer: Medicare HMO

## 2022-06-08 NOTE — Telephone Encounter (Signed)
Please re enroll patient for novo 2024: Ozempic '1mg'$  sq weekly  Patient has been re-enrolled for [employer] grant for HLD (repatha vs praluent)

## 2022-06-18 DIAGNOSIS — E119 Type 2 diabetes mellitus without complications: Secondary | ICD-10-CM | POA: Diagnosis not present

## 2022-06-18 DIAGNOSIS — H52203 Unspecified astigmatism, bilateral: Secondary | ICD-10-CM | POA: Diagnosis not present

## 2022-06-18 DIAGNOSIS — Z961 Presence of intraocular lens: Secondary | ICD-10-CM | POA: Diagnosis not present

## 2022-06-18 LAB — HM DIABETES EYE EXAM

## 2022-06-21 ENCOUNTER — Ambulatory Visit
Admission: RE | Admit: 2022-06-21 | Discharge: 2022-06-21 | Disposition: A | Discharge status: Home / Self Care | Payer: Medicare HMO | Source: Ambulatory Visit | Attending: Family Medicine | Admitting: Family Medicine

## 2022-06-21 DIAGNOSIS — Z1231 Encounter for screening mammogram for malignant neoplasm of breast: Secondary | ICD-10-CM | POA: Diagnosis not present

## 2022-06-21 NOTE — Telephone Encounter (Signed)
Mailed application to pt's home

## 2022-06-21 NOTE — Telephone Encounter (Signed)
Called patient x2 and left VM to ensure she has cholesterol medication and grant

## 2022-06-25 ENCOUNTER — Telehealth: Payer: Self-pay | Admitting: Family Medicine

## 2022-06-25 ENCOUNTER — Ambulatory Visit (INDEPENDENT_AMBULATORY_CARE_PROVIDER_SITE_OTHER): Payer: Medicare HMO | Admitting: Family Medicine

## 2022-06-25 ENCOUNTER — Encounter: Payer: Self-pay | Admitting: Family Medicine

## 2022-06-25 ENCOUNTER — Other Ambulatory Visit: Payer: Self-pay | Admitting: Family Medicine

## 2022-06-25 VITALS — BP 119/73 | HR 84 | Temp 97.6°F | Ht 65.0 in | Wt 237.6 lb

## 2022-06-25 DIAGNOSIS — I152 Hypertension secondary to endocrine disorders: Secondary | ICD-10-CM | POA: Diagnosis not present

## 2022-06-25 DIAGNOSIS — M810 Age-related osteoporosis without current pathological fracture: Secondary | ICD-10-CM | POA: Diagnosis not present

## 2022-06-25 DIAGNOSIS — E1169 Type 2 diabetes mellitus with other specified complication: Secondary | ICD-10-CM | POA: Diagnosis not present

## 2022-06-25 DIAGNOSIS — E1159 Type 2 diabetes mellitus with other circulatory complications: Secondary | ICD-10-CM

## 2022-06-25 DIAGNOSIS — E785 Hyperlipidemia, unspecified: Secondary | ICD-10-CM

## 2022-06-25 DIAGNOSIS — R928 Other abnormal and inconclusive findings on diagnostic imaging of breast: Secondary | ICD-10-CM

## 2022-06-25 LAB — BAYER DCA HB A1C WAIVED: HB A1C (BAYER DCA - WAIVED): 5.7 % — ABNORMAL HIGH (ref 4.8–5.6)

## 2022-06-25 MED ORDER — POTASSIUM CHLORIDE ER 10 MEQ PO TBCR: 10.0000 meq | EXTENDED_RELEASE_TABLET | Freq: Every day | ORAL | 3 refills | Status: DC

## 2022-06-25 MED ORDER — FUROSEMIDE 20 MG PO TABS: 20.0000 mg | ORAL_TABLET | Freq: Every day | ORAL | 3 refills | Status: DC

## 2022-06-25 MED ORDER — SEMAGLUTIDE (1 MG/DOSE) 4 MG/3ML ~~LOC~~ SOPN: 1.0000 mg | PEN_INJECTOR | SUBCUTANEOUS | 0 refills | Status: DC

## 2022-06-25 MED ORDER — LOSARTAN POTASSIUM 25 MG PO TABS: 25.0000 mg | ORAL_TABLET | Freq: Two times a day (BID) | ORAL | 3 refills | Status: DC

## 2022-06-25 MED ORDER — DILTIAZEM HCL ER COATED BEADS 120 MG PO CP24: ORAL_CAPSULE | ORAL | 3 refills | Status: DC

## 2022-06-25 MED ORDER — ALENDRONATE SODIUM 70 MG PO TABS: ORAL_TABLET | ORAL | 3 refills | Status: DC

## 2022-06-25 MED ORDER — SPIRONOLACTONE 25 MG PO TABS: 12.5000 mg | ORAL_TABLET | Freq: Two times a day (BID) | ORAL | 3 refills | Status: DC

## 2022-06-25 MED ORDER — OMEPRAZOLE 20 MG PO CPDR: 20.0000 mg | DELAYED_RELEASE_CAPSULE | Freq: Every day | ORAL | 3 refills | Status: DC

## 2022-06-25 NOTE — Telephone Encounter (Signed)
Patient is requesting a visit with you to discuss medication and patient assistance

## 2022-06-25 NOTE — Patient Instructions (Signed)
I've increased your Ozempic to '1mg'$  weekly.  Will get Almyra Free to arrange that change in dose for you Hopefully, will get that Praluent issue worked out as well.

## 2022-06-25 NOTE — Progress Notes (Signed)
Subjective: CC:DM PCP: Janora Norlander, DO [id number] Sara [name] is a 76 y.o. female presenting to clinic today for:  1. Type 2 Diabetes with hypertension, hyperlipidemia:  Patient reports compliance with Ozempic, atorvastatin, Cardizem, losartan, spironolactone.  She is down about 41 pounds.  She continues to watch carbohydrates.  No hypoglycemic episodes reported Last eye exam: UTD Last foot exam: UTD Last A1c:  Lab Results  Component Value Date   HGBA1C 5.5 01/02/2022   Nephropathy screen indicated?: needs Last flu, zoster and/or pneumovax:  Immunization History  Administered Date(s) Administered   Fluad Quad(high Dose 65+) 05/04/2019, 05/25/2020, 06/13/2021   Influenza Whole 04/22/2009   Influenza, High Dose Seasonal PF 05/01/2018   Influenza,inj,Quad PF,6+ Mos 04/25/2015   Influenza-Unspecified 05/18/2014, 07/05/2016, 05/13/2017   Moderna Sars-Covid-2 Vaccination 10/12/2019, 11/09/2019, 06/01/2020, 01/04/2021   Pneumococcal Conjugate-13 06/15/2014   Pneumococcal Polysaccharide-23 04/22/2008, 05/04/2019   Tdap 04/22/2013, 01/11/2021   Zoster Recombinat (Shingrix) 02/24/2021, 04/26/2021   Zoster, Live 08/03/2010    ROS: No chest pain, shortness of breath.  Occasionally gets heart palpitations but no tachycardia.  Needs assistance with Praluent and Ozempic.    ROS: Per HPI  Allergies  Allergen Reactions   Latex Other (See Comments)   Nifedipine Other (See Comments)    Brings blood pressure up really fast PROCARDIA   Tape     bleeding   Codeine Itching   Crestor [Rosuvastatin] Other (See Comments)    myalgias   Past Medical History:  Diagnosis Date   Arthritis    Arthritis -hands -Bil. 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 s/p Xience DES x 3 to LAD;  b. nuc study 02/27/10: EF 67% no ischemia;  c.  echo 11/12: Mild LVH, EF 63-81%, grade 1 diastolic  dysfunction, moderate LAE;  d.  LHC 07/03/11: LAD stents patent, RCA 25%, EF 55-65% ;  e. [place]. Myoview 5/14:  No ischemia, EF 68%   COVID-19 2021   Depression    Diverticular disease    GERD (gastroesophageal reflux disease)    Hemorrhoid    Hepatitis    yellow jaundice as a child    HTN (hypertension)    Hyperlipidemia    Hypertension    Impaired hearing    bilateral- no hearing aids   Myocardial infarction (HCC)    hx of x 2 3-4 years ago per pt   Obesity    OSA (obstructive sleep apnea)    needs no cpap per patient pt denies   Other psoriasis    ongoing- none at present.   Vertigo     Current Outpatient Medications:    albuterol (VENTOLIN HFA) 108 (90 Base) MCG/ACT inhaler, TAKE 2 PUFFS BY MOUTH EVERY 6 HOURS AS NEEDED FOR WHEEZE OR SHORTNESS OF BREATH (Patient taking differently: Inhale 2 puffs into the lungs every 6 (six) hours as needed for wheezing or shortness of breath.), Disp: 18 each, Rfl: 1   alendronate (FOSAMAX) 70 MG tablet, TAKE 1 TABLET EVERY 7 DAYS WITH A FULL GLASS OF WATER ON AN EMPTY STOMACH, Disp: 4 tablet, Rfl: 3   Alirocumab (PRALUENT) 150 MG/ML SOAJ, Inject 150 mg into the skin every 14 (fourteen) days., Disp: 2 mL, Rfl: 2   ALPRAZolam (XANAX) 0.5 MG tablet, TAKE 1 TABLET BY MOUTH TWICE A DAY AS NEEDED FOR ANXIETY, Disp: 60 tablet, Rfl: 0  aspirin EC 81 MG tablet, Take 81 mg by mouth daily., Disp: , Rfl:    betamethasone valerate ointment (VALISONE) 0.1 %, Apply 1 application topically 2 (two) times daily as needed (psoriasis). (DO NOT apply to face, groin or axilla), Disp: 45 g, Rfl: 2   budesonide-formoterol (SYMBICORT) 80-4.5 MCG/ACT inhaler, Inhale 2 puffs into the lungs 2 (two) times daily., Disp: 1 each, Rfl: 3   Calcium Carbonate-Vitamin D (SUPER CALCIUM 600 + D3 PO), Take 1 tablet by mouth daily., Disp: , Rfl:    cyclobenzaprine (FLEXERIL) 5 MG tablet, TAKE 1 TABLET BY MOUTH THREE TIMES A DAY AS NEEDED FOR MUSCLE SPASMS, Disp: 30 tablet, Rfl: 0    diltiazem (CARDIZEM CD) 120 MG 24 hr capsule, TAKE 1 CAPSULE BY MOUTH EVERY DAY, Disp: 90 capsule, Rfl: 1   furosemide (LASIX) 20 MG tablet, TAKE 1 TABLET BY MOUTH EVERY DAY, Disp: 90 tablet, Rfl: 1   losartan (COZAAR) 25 MG tablet, TAKE 1 TABLET BY MOUTH TWICE A DAY, Disp: 180 tablet, Rfl: 1   meclizine (ANTIVERT) 25 MG tablet, Take 1 tablet (25 mg total) by mouth 2 (two) times daily as needed for dizziness. Reported on 12/05/2015, Disp: 30 tablet, Rfl: 1   mometasone (NASONEX) 50 MCG/ACT nasal spray, Place 2 sprays into the nose daily., Disp: 1 each, Rfl: 12   nitroGLYCERIN (NITROSTAT) 0.4 MG SL tablet, Place 1 tablet (0.4 mg total) under the tongue every 5 (five) minutes as needed for chest pain (x 3 doses). Reported on 12/05/2015, Disp: 25 tablet, Rfl: 1   omeprazole (PRILOSEC) 20 MG capsule, TAKE 1 CAPSULE BY MOUTH EVERY DAY, Disp: 90 capsule, Rfl: 0   potassium chloride (KLOR-CON) 10 MEQ tablet, TAKE 1 TABLET BY MOUTH EVERY DAY, Disp: 90 tablet, Rfl: 3   Semaglutide,0.25 or 0.'5MG'$ /DOS, (OZEMPIC, 0.25 OR 0.5 MG/DOSE,) 2 MG/1.5ML SOPN, Inject 0.25 mg into the skin every 7 (seven) days for 28 days, THEN 0.5 mg every 7 (seven) days. (Patient taking differently: Inject 0.5 mg every 7 (seven) days. Will increase to '1mg'$  weekly when supply arrives at PCP office), Disp: 4.5 mL, Rfl: 3   spironolactone (ALDACTONE) 25 MG tablet, TAKE 1/2 TABLET BY MOUTH TWICE A DAY, Disp: 90 tablet, Rfl: 1 Social History   Socioeconomic History   Marital status: Married    Spouse name: Mortimer Fries   Number of children: 3   Years of education: Not on file   Highest education level: Not on file  Occupational History   Occupation: hairdresser  Tobacco Use   Smoking status: Never   Smokeless tobacco: Never  Vaping Use   Vaping Use: Never used  Substance and Sexual Activity   Alcohol use: No   Drug use: No   Sexual activity: Not Currently    Birth control/protection: Post-menopausal  Other Topics Concern   Not on file   Social History Narrative   Lives with husband, Mortimer Fries.    1 son died in Oct 22, 2017 from [place].    Social Determinants of Health   Financial Resource Strain: Low Risk  (07/05/2021)   Overall Financial Resource Strain (CARDIA)    Difficulty of Paying Living Expenses: Not hard at all  Food Insecurity: No Food Insecurity (07/05/2021)   Hunger Vital Sign    Worried About Running Out of Food in the Last Year: Never true    Ran Out of Food in the Last Year: Never true  Transportation Needs: No Transportation Needs (07/05/2021)   PRAPARE - Transportation    Lack  of Transportation (Medical): No    Lack of Transportation (Non-Medical): No  Physical Activity: Insufficiently Active (07/05/2021)   Exercise Vital Sign    Days of Exercise per Week: 2 days    Minutes of Exercise per Session: 10 min  Stress: No Stress Concern Present (07/05/2021)   [place]    Feeling of Stress : Only a little  Social Connections: Socially Integrated (07/05/2021)   Social Connection and Isolation Panel [NHANES]    Frequency of Communication with Friends and Family: More than three times a week    Frequency of Social Gatherings with Friends and Family: More than three times a week    Attends Religious Services: More than 4 times per year    Active Member of [employer] or Organizations: Yes    Attends Music therapist: More than 4 times per year    Marital Status: Married  Human resources officer Violence: Not At Risk (07/05/2021)   Humiliation, Afraid, Rape, and Kick questionnaire    Fear of Current or Ex-Partner: No    Emotionally Abused: No    Physically Abused: No    Sexually Abused: No   Family History  Problem Relation Age of Onset   Stroke Mother    Hypertension Mother    Heart failure Mother    Breast cancer Mother    Aneurysm Father    Seizures Brother    Muscular dystrophy Daughter        dx as infant, passed age 34   Seizures Son     Colon cancer Neg Hx    Esophageal cancer Neg Hx    Rectal cancer Neg Hx    Stomach cancer Neg Hx     Objective: Office vital signs reviewed. BP 119/73   Pulse 84   Temp 97.6 F (36.4 C)   Ht '5\' 5"'$  (1.651 m)   Wt 237 lb 9.6 oz (107.8 kg)   SpO2 93%   BMI 39.54 kg/m   Physical Examination:  General: Awake, alert, obese, No acute distress HEENT: sclera white, MMM Cardio: regular rate and rhythm, S1S2 heard, soft systolic murmurs appreciated Pulm: clear to auscultation bilaterally, no wheezes, rhonchi or rales; normal work of breathing on room air  Assessment/ Plan: 76 y.o. female   Controlled type 2 diabetes mellitus with other specified complication, without long-term current use of insulin (HCC) - Plan: Bayer DCA Hb A1c Waived, Microalbumin / creatinine urine ratio, Semaglutide, 1 MG/DOSE, 4 MG/3ML SOPN  Hypertension associated with diabetes ([place]) - Plan: diltiazem (CARDIZEM CD) 120 MG 24 hr capsule, furosemide (LASIX) 20 MG tablet, losartan (COZAAR) 25 MG tablet, potassium chloride (KLOR-CON) 10 MEQ tablet, spironolactone (ALDACTONE) 25 MG tablet  Morbid obesity (HCC)  Hyperlipidemia associated with type 2 diabetes mellitus ([place]) - Plan: Lipid Panel  Age-related osteoporosis without current pathological fracture - Plan: DG [facility] DEXA, VITAMIN D 25 Hydroxy (Vit-D Deficiency, Fractures), alendronate (FOSAMAX) 70 MG tablet  Check A1c.  Advance Ozempic to 1 mg weekly.  We will CC to clinical pharmacy to assist with this through patient assistance program  Blood pressure well controlled.  No changes  Check fasting lipid  Check vitamin D level, DEXA scan.  Continue Fosamax  Orders Placed This Encounter  Procedures   DG [facility] DEXA    Standing Status:   Future    Standing Expiration Date:   06/26/2023    Order Specific Question:   Reason for Exam (SYMPTOM  OR DIAGNOSIS REQUIRED)  Answer:   osteoporosis management   Bayer DCA Hb A1c Waived   Microalbumin / creatinine  urine ratio   VITAMIN D 25 Hydroxy (Vit-D Deficiency, Fractures)   Lipid Panel   Meds ordered this encounter  Medications   Semaglutide, 1 MG/DOSE, 4 MG/3ML SOPN    Sig: Inject 1 mg as directed once a week.    Dispense:  3 mL    Refill:  0    NOVO PAP   alendronate (FOSAMAX) 70 MG tablet    Sig: TAKE 1 TABLET EVERY 7 DAYS WITH A FULL GLASS OF WATER ON AN EMPTY STOMACH    Dispense:  12 tablet    Refill:  3   diltiazem (CARDIZEM CD) 120 MG 24 hr capsule    Sig: TAKE 1 CAPSULE BY MOUTH EVERY DAY    Dispense:  90 capsule    Refill:  3   furosemide (LASIX) 20 MG tablet    Sig: Take 1 tablet (20 mg total) by mouth daily.    Dispense:  90 tablet    Refill:  3   losartan (COZAAR) 25 MG tablet    Sig: Take 1 tablet (25 mg total) by mouth 2 (two) times daily.    Dispense:  180 tablet    Refill:  3   omeprazole (PRILOSEC) 20 MG capsule    Sig: Take 1 capsule (20 mg total) by mouth daily.    Dispense:  90 capsule    Refill:  3   potassium chloride (KLOR-CON) 10 MEQ tablet    Sig: Take 1 tablet (10 mEq total) by mouth daily.    Dispense:  90 tablet    Refill:  3   spironolactone (ALDACTONE) 25 MG tablet    Sig: Take 0.5 tablets (12.5 mg total) by mouth 2 (two) times daily.    Dispense:  90 tablet    Refill:  [place], [place] [phone]

## 2022-06-26 ENCOUNTER — Ambulatory Visit (INDEPENDENT_AMBULATORY_CARE_PROVIDER_SITE_OTHER): Payer: Medicare HMO

## 2022-06-26 DIAGNOSIS — M8589 Other specified disorders of bone density and structure, multiple sites: Secondary | ICD-10-CM | POA: Diagnosis not present

## 2022-06-26 DIAGNOSIS — M810 Age-related osteoporosis without current pathological fracture: Secondary | ICD-10-CM | POA: Diagnosis not present

## 2022-06-26 DIAGNOSIS — Z780 Asymptomatic menopausal state: Secondary | ICD-10-CM | POA: Diagnosis not present

## 2022-06-26 LAB — MICROALBUMIN / CREATININE URINE RATIO
Creatinine, Urine: 51.3 mg/dL
Microalb/Creat Ratio: <6 mg/g creat (ref 0–29)
Microalbumin, Urine: <3 ug/mL

## 2022-06-26 LAB — VITAMIN D 25 HYDROXY (VIT D DEFICIENCY, FRACTURES): Vit D, 25-Hydroxy: 50.4 ng/mL (ref 30.0–100.0)

## 2022-06-27 ENCOUNTER — Telehealth: Payer: Self-pay

## 2022-06-27 LAB — LIPID PANEL
Chol/HDL Ratio: 2.9 ratio (ref 0.0–4.4)
Cholesterol, Total: 140 mg/dL (ref 100–199)
HDL: 49 mg/dL (ref >39)
LDL Chol Calc (NIH): 77 mg/dL (ref 0–99)
Triglycerides: 69 mg/dL (ref 0–149)
VLDL Cholesterol Cal: 14 mg/dL (ref 5–40)

## 2022-06-27 LAB — SPECIMEN STATUS REPORT

## 2022-06-27 NOTE — Telephone Encounter (Signed)
Patient informed that two boxes of Ozempic 4 mg/3 ml have been received and will be placed in refrigerator for her to pick up.

## 2022-07-04 ENCOUNTER — Ambulatory Visit (INDEPENDENT_AMBULATORY_CARE_PROVIDER_SITE_OTHER): Payer: Medicare HMO

## 2022-07-04 DIAGNOSIS — Z23 Encounter for immunization: Secondary | ICD-10-CM

## 2022-07-05 ENCOUNTER — Ambulatory Visit
Admission: RE | Admit: 2022-07-05 | Discharge: 2022-07-05 | Disposition: A | Discharge status: Home / Self Care | Payer: Medicare HMO | Source: Ambulatory Visit | Attending: Family Medicine | Admitting: Family Medicine

## 2022-07-05 ENCOUNTER — Ambulatory Visit: Payer: Medicare HMO

## 2022-07-05 DIAGNOSIS — R928 Other abnormal and inconclusive findings on diagnostic imaging of breast: Secondary | ICD-10-CM

## 2022-08-01 ENCOUNTER — Telehealth: Payer: Self-pay | Admitting: Family Medicine

## 2022-08-15 NOTE — Telephone Encounter (Signed)
Received notification from [name] regarding approval for Sara [name]. Patient assistance approved UNTIL 07/23/23.  MEDICATION WILL SHIP TO OFFICE  Phone: [phone]   Informed pt of ozempic re-enrollment. Also informed her that all patients rec'd the letter about the levemir and to disregard it.  Rec'd letter from [facility] regarding Praluent injection saying they have provided her with a temp supply but medication not on formulary. Patient says she was receiving a grant for this medication. Informed her I would let Almyra Free know.

## 2022-08-15 NOTE — Telephone Encounter (Signed)
Can you touchbase with patient at your convenience? She got a d/c letter Would also check on her assistance/status  Thanks!

## 2022-09-04 ENCOUNTER — Ambulatory Visit (INDEPENDENT_AMBULATORY_CARE_PROVIDER_SITE_OTHER): Payer: Medicare HMO

## 2022-09-04 VITALS — Ht 64.0 in | Wt 227.0 lb

## 2022-09-04 DIAGNOSIS — Z0000 Encounter for general adult medical examination without abnormal findings: Secondary | ICD-10-CM | POA: Diagnosis not present

## 2022-09-04 NOTE — Progress Notes (Signed)
Subjective:   Sara [name] is a 77 y.o. female who presents for Medicare Annual (Subsequent) preventive examination. I connected with  Onie Edwardson on 09/04/22 by a audio enabled telemedicine application and verified that I am speaking with the correct person using two identifiers.  Patient Location: Home  Provider Location: Home Office  I discussed the limitations of evaluation and management by telemedicine. The patient expressed understanding and agreed to proceed.  Review of Systems     Cardiac Risk Factors include: advanced age (>72mn, >>29women);hypertension     Objective:    Today's Vitals   09/04/22 0953  Weight: 227 lb (103 kg)  Height: 5' 4"$  (1.626 m)   Body mass index is 38.96 kg/m.     09/04/2022    9:57 AM 10/25/2021   12:57 PM 07/05/2021   10:09 AM 05/29/2021   12:47 PM 01/20/2021    5:01 PM 05/21/2017    9:03 AM 05/15/2017    1:04 PM  Advanced Directives  Does Patient Have a Medical Advance Directive? No No No No No No No  Would patient like information on creating a medical advance directive? No - Patient declined  No - Patient declined  No - Patient declined No - Patient declined     Current Medications (verified) Outpatient Encounter Medications as of 09/04/2022  Medication Sig   albuterol (VENTOLIN HFA) 108 (90 Base) MCG/ACT inhaler TAKE 2 PUFFS BY MOUTH EVERY 6 HOURS AS NEEDED FOR WHEEZE OR SHORTNESS OF BREATH (Patient taking differently: Inhale 2 puffs into the lungs every 6 (six) hours as needed for wheezing or shortness of breath.)   alendronate (FOSAMAX) 70 MG tablet TAKE 1 TABLET EVERY 7 DAYS WITH A FULL GLASS OF WATER ON AN EMPTY STOMACH   Alirocumab (PRALUENT) 150 MG/ML SOAJ Inject 150 mg into the skin every 14 (fourteen) days.   ALPRAZolam (XANAX) 0.5 MG tablet TAKE 1 TABLET BY MOUTH TWICE A DAY AS NEEDED FOR ANXIETY   aspirin EC 81 MG tablet Take 81 mg by mouth daily.   betamethasone valerate ointment (VALISONE) 0.1 % Apply 1  application topically 2 (two) times daily as needed (psoriasis). (DO NOT apply to face, groin or axilla)   budesonide-formoterol (SYMBICORT) 80-4.5 MCG/ACT inhaler Inhale 2 puffs into the lungs 2 (two) times daily.   Calcium Carbonate-Vitamin D (SUPER CALCIUM 600 + D3 PO) Take 1 tablet by mouth daily.   cyclobenzaprine (FLEXERIL) 5 MG tablet TAKE 1 TABLET BY MOUTH THREE TIMES A DAY AS NEEDED FOR MUSCLE SPASMS   diltiazem (CARDIZEM CD) 120 MG 24 hr capsule TAKE 1 CAPSULE BY MOUTH EVERY DAY   furosemide (LASIX) 20 MG tablet Take 1 tablet (20 mg total) by mouth daily.   losartan (COZAAR) 25 MG tablet Take 1 tablet (25 mg total) by mouth 2 (two) times daily.   meclizine (ANTIVERT) 25 MG tablet Take 1 tablet (25 mg total) by mouth 2 (two) times daily as needed for dizziness. Reported on 12/05/2015   mometasone (NASONEX) 50 MCG/ACT nasal spray Place 2 sprays into the nose daily.   nitroGLYCERIN (NITROSTAT) 0.4 MG SL tablet Place 1 tablet (0.4 mg total) under the tongue every 5 (five) minutes as needed for chest pain (x 3 doses). Reported on 12/05/2015   omeprazole (PRILOSEC) 20 MG capsule Take 1 capsule (20 mg total) by mouth daily.   potassium chloride (KLOR-CON) 10 MEQ tablet Take 1 tablet (10 mEq total) by mouth daily.   Semaglutide, 1 MG/DOSE, 4 MG/3ML  SOPN Inject 1 mg as directed once a week.   spironolactone (ALDACTONE) 25 MG tablet Take 0.5 tablets (12.5 mg total) by mouth 2 (two) times daily.   No facility-administered encounter medications on file as of 09/04/2022.    Allergies (verified) Latex, Nifedipine, Tape, Codeine, and Crestor [rosuvastatin]   History: Past Medical History:  Diagnosis Date   Arthritis    Arthritis -hands -Bil. 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 s/p Xience DES x 3 to LAD;  b. nuc study 02/27/10: EF 67% no ischemia;  c.  echo 11/12: Mild LVH, EF 55-65%, grade 1  diastolic dysfunction, moderate LAE;  d.  LHC 07/03/11: LAD stents patent, RCA 25%, EF 55-65% ;  e. [place]. Myoview 5/14:  No ischemia, EF 68%   COVID-19 2021   Depression    Diverticular disease    GERD (gastroesophageal reflux disease)    Hemorrhoid    Hepatitis    yellow jaundice as a child    HTN (hypertension)    Hyperlipidemia    Hypertension    Impaired hearing    bilateral- no hearing aids   Myocardial infarction (HCC)    hx of x 2 3-4 years ago per pt   Obesity    OSA (obstructive sleep apnea)    needs no cpap per patient pt denies   Other psoriasis    ongoing- none at present.   Vertigo    Past Surgical History:  Procedure Laterality Date   ABDOMINAL HYSTERECTOMY     BACK SURGERY     Dr. Lawernce Pitts   CARDIAC CATHETERIZATION  08/23/2011   Ablation AV  Node   CATARACT EXTRACTION Bilateral    COLONOSCOPY     CORONARY ANGIOPLASTY     with stents 2009    KNEE SURGERY     right   LEFT AND RIGHT HEART CATHETERIZATION WITH CORONARY ANGIOGRAM N/A 08/18/2013   Procedure: LEFT AND RIGHT HEART CATHETERIZATION WITH CORONARY ANGIOGRAM;  Surgeon: Larey Dresser, MD;  Location: [facility] CATH LAB;  Service: Cardiovascular;  Laterality: N/A;   LEFT HEART CATHETERIZATION WITH CORONARY ANGIOGRAM N/A 07/03/2011   Procedure: LEFT HEART CATHETERIZATION WITH CORONARY ANGIOGRAM;  Surgeon: Minus Breeding, MD;  Location[facility] CATH LAB;  Service: Cardiovascular;  Laterality: N/A;   REPAIR RECTOCELE     SUPRAVENTRICULAR TACHYCARDIA ABLATION N/A 08/23/2011   Procedure: SUPRAVENTRICULAR TACHYCARDIA ABLATION;  Surgeon: Evans Lance, MD;  Location: [facility] CATH LAB;  Service: Cardiovascular;  Laterality: N/A;   TOTAL HIP ARTHROPLASTY Right 02/23/2016   Procedure: RIGHT TOTAL HIP ARTHROPLASTY ANTERIOR APPROACH;  Surgeon: Rod Can, MD;  Location: [facility] ORS;  Service: Orthopedics;  Laterality: Right;   TOTAL KNEE ARTHROPLASTY Left 12/22/2012   Procedure: LEFT TOTAL KNEE ARTHROPLASTY;  Surgeon: Gearlean Alf, MD;  Location: [facility] ORS;  Service: Orthopedics;  Laterality: Left;   TUBAL LIGATION     UPPER GASTROINTESTINAL ENDOSCOPY     Family History  Problem Relation Age of Onset   Stroke Mother    Hypertension Mother    Heart failure Mother    Breast cancer Mother    Aneurysm Father    Seizures Brother    Muscular dystrophy Daughter        dx as infant, passed age 33   Seizures Son    Colon cancer Neg Hx    Esophageal cancer  Neg Hx    Rectal cancer Neg Hx    Stomach cancer Neg Hx    Social History   Socioeconomic History   Marital status: Married    Spouse name: Mortimer Fries   Number of children: 3   Years of education: Not on file   Highest education level: Not on file  Occupational History   Occupation: hairdresser  Tobacco Use   Smoking status: Never   Smokeless tobacco: Never  Vaping Use   Vaping Use: Never used  Substance and Sexual Activity   Alcohol use: No   Drug use: No   Sexual activity: Not Currently    Birth control/protection: Post-menopausal  Other Topics Concern   Not on file  Social History Narrative   Lives with husband, Mortimer Fries.    1 son died in Oct 20, 2017 from [place].    Social Determinants of Health   Financial Resource Strain: Low Risk  (09/04/2022)   Overall Financial Resource Strain (CARDIA)    Difficulty of Paying Living Expenses: Not hard at all  Food Insecurity: No Food Insecurity (09/04/2022)   Hunger Vital Sign    Worried About Running Out of Food in the Last Year: Never true    Ran Out of Food in the Last Year: Never true  Transportation Needs: No Transportation Needs (09/04/2022)   PRAPARE - Hydrologist (Medical): No    Lack of Transportation (Non-Medical): No  Physical Activity: Inactive (09/04/2022)   Exercise Vital Sign    Days of Exercise per Week: 0 days    Minutes of Exercise per Session: 0 min  Stress: No Stress Concern Present (09/04/2022)   [place]    Feeling of Stress : Not at all  Social Connections: [place] (09/04/2022)   Social Connection and Isolation Panel [NHANES]    Frequency of Communication with Friends and Family: More than three times a week    Frequency of Social Gatherings with Friends and Family: More than three times a week    Attends Religious Services: More than 4 times per year    Active Member of [employer] or Organizations: Yes    Attends Music therapist: More than 4 times per year    Marital Status: Married    Tobacco Counseling Counseling given: Not Answered   Clinical Intake:  Pre-visit preparation completed: Yes  Pain : No/denies pain     Nutritional Risks: None Diabetes: Yes CBG done?: No Did pt. bring in CBG monitor from home?: No  How often do you need to have someone help you when you read instructions, pamphlets, or other written materials from your doctor or pharmacy?: 1 - Never  Diabetic?yes  Nutrition Risk Assessment:  Has the patient had any N/V/D within the last 2 months?  Yes  Does the patient have any non-healing wounds?  No  Has the patient had any unintentional weight loss or weight gain?  No   Diabetes:  Is the patient diabetic?  Yes  If diabetic, was a CBG obtained today?  No  Did the patient bring in their glucometer from home?  No  How often do you monitor your CBG's? Never .   Financial Strains and Diabetes Management:  Are you having any financial strains with the device, your supplies or your medication? No .  Does the patient want to be seen by Chronic Care Management for management of their diabetes?  No  Would the patient like  to be referred to a Nutritionist or for Diabetic Management?  No   Diabetic Exams:  Diabetic Eye Exam: Completed 05/2022 Diabetic Foot Exam: Overdue, Pt has been advised about the importance in completing this exam. Pt is scheduled for diabetic foot exam on next office visit .  Interpreter Needed?:  No  Information entered by :: Jadene Pierini, LPN   Activities of Daily Living    09/04/2022    9:57 AM  In your present state of health, do you have any difficulty performing the following activities:  Hearing? 0  Vision? 0  Difficulty concentrating or making decisions? 0  Walking or climbing stairs? 0  Dressing or bathing? 0  Doing errands, shopping? 0  Preparing Food and eating ? N  Using the Toilet? N  In the past six months, have you accidently leaked urine? N  Do you have problems with loss of bowel control? N  Managing your Medications? N  Managing your Finances? N  Housekeeping or managing your Housekeeping? N    Patient Care Team: Janora Norlander, DO as PCP - General (Family Medicine) Bensimhon, Shaune Pascal, MD (Cardiology) Gaynelle Arabian, MD as Consulting Physician (Orthopedic Surgery) Lavera Guise, [facility] (Pharmacist)  Indicate any recent Medical Services you may have received from other than [facility] providers in the past year (date may be approximate).     Assessment:   This is a routine wellness examination for Ardath.  Hearing/Vision screen Vision Screening - Comments:: Wears rx glasses - up to date with routine eye exams with  Dr.Lyles   Dietary issues and exercise activities discussed: Current Exercise Habits: The patient does not participate in regular exercise at present   Goals Addressed             This Visit's Progress    DIET - INCREASE WATER INTAKE         Depression Screen    09/04/2022    9:56 AM 06/25/2022    8:07 AM 01/02/2022    8:37 AM 09/18/2021    3:06 PM 07/05/2021   10:03 AM 02/24/2021    9:32 AM 08/23/2020    8:16 AM  PHQ 2/9 Scores  PHQ - 2 Score 0 0 1 1 0 2 0  PHQ- 9 Score   5 7  8 $ 0    Fall Risk    09/04/2022    9:55 AM 06/25/2022    8:07 AM 01/02/2022    8:37 AM 09/18/2021    3:06 PM 07/05/2021   10:10 AM  Fall Risk   Falls in the past year? 0 0 0 0 0  Number falls in past yr: 0    0  Injury with Fall? 0    0  Risk  for fall due to : No Fall Risks    No Fall Risks  Follow up Falls prevention discussed  Falls evaluation completed  Falls prevention discussed    FALL RISK PREVENTION PERTAINING TO THE HOME:  Any stairs in or around the home? Yes  If so, are there any without handrails? No  Home free of loose throw rugs in walkways, pet beds, electrical cords, etc? Yes  Adequate lighting in your home to reduce risk of falls? Yes   ASSISTIVE DEVICES UTILIZED TO PREVENT FALLS:  Life alert? No  Use of a cane, walker or w/c? No  Grab bars in the bathroom? No  Shower chair or bench in shower? No  Elevated toilet seat or a handicapped toilet? No  05/21/2017    9:22 AM  MMSE - Mini Mental State Exam  Orientation to time 5  Orientation to Place 5  Registration 3  Attention/ Calculation 5  Recall 3  Language- name 2 objects 2  Language- repeat 1  Language- follow 3 step command 3  Language- read & follow direction 1  Write a sentence 1  Copy design 1  Total score 30        09/04/2022    9:57 AM 07/05/2021   10:17 AM  6CIT Screen  What Year? 0 points 0 points  What month? 0 points 0 points  What time? 0 points 0 points  Count back from 20 0 points 0 points  Months in reverse 0 points 0 points  Repeat phrase 0 points 0 points  Total Score 0 points 0 points    Immunizations Immunization History  Administered Date(s) Administered   Fluad Quad(high Dose 65+) 05/04/2019, 05/25/2020, 06/13/2021, 07/04/2022   Influenza Whole 04/22/2009   Influenza, High Dose Seasonal PF 05/01/2018   Influenza,inj,Quad PF,6+ Mos 04/25/2015   Influenza-Unspecified 05/18/2014, 07/05/2016, 05/13/2017   Moderna Sars-Covid-2 Vaccination 10/12/2019, 11/09/2019, 06/01/2020, 01/04/2021   Pneumococcal Conjugate-13 06/15/2014   Pneumococcal Polysaccharide-23 04/22/2008, 05/04/2019   Tdap 04/22/2013, 01/11/2021   Zoster Recombinat (Shingrix) 02/24/2021, 04/26/2021   Zoster, Live 08/03/2010    TDAP status:  Up to date  Flu Vaccine status: Up to date  Pneumococcal vaccine status: Up to date  Covid-19 vaccine status: Completed vaccines  Qualifies for Shingles Vaccine? Yes   Zostavax completed No   Shingrix Completed?: No.    Education has been provided regarding the importance of this vaccine. Patient has been advised to call insurance company to determine out of pocket expense if they have not yet received this vaccine. Advised may also receive vaccine at local pharmacy or Health Dept. Verbalized acceptance and understanding.  Screening Tests Health Maintenance  Topic Date Due   COVID-19 Vaccine (5 - 2023-24 season) 03/23/2022   HEMOGLOBIN A1C  12/25/2022   Diabetic kidney evaluation - eGFR measurement  01/03/2023   FOOT EXAM  01/03/2023   OPHTHALMOLOGY EXAM  06/19/2023   Diabetic kidney evaluation - Urine ACR  06/26/2023   Medicare Annual Wellness (AWV)  09/05/2023   DEXA SCAN  06/26/2024   DTaP/Tdap/Td (3 - Td or Tdap) 01/12/2031   Pneumonia Vaccine 61+ Years old  Completed   INFLUENZA VACCINE  Completed   Hepatitis C Screening  Completed   Zoster Vaccines- Shingrix  Completed   HPV VACCINES  Aged Out   COLONOSCOPY (Pts 45-46yr Insurance coverage will need to be confirmed)  Discontinued    Health Maintenance  Health Maintenance Due  Topic Date Due   COVID-19 Vaccine (5 - 2023-24 season) 03/23/2022    Colorectal cancer screening: No longer required.   Mammogram status: No longer required due to age.  Bone Density status: Completed 06/26/2022. Results reflect: Bone density results: OSTEOPENIA. Repeat every 5 years.  Lung Cancer Screening: (Low Dose CT Chest recommended if Age 78-80years, 30 pack-year currently smoking OR have quit w/in 15years.) does not qualify.   Lung Cancer Screening Referral: n/a  Additional Screening:  Hepatitis C Screening: does not qualify;   Vision Screening: Recommended annual ophthalmology exams for early detection of glaucoma and other  disorders of the eye. Is the patient up to date with their annual eye exam?  Yes  Who is the provider or what is the name of the office in which the patient attends annual  eye exams? Dr.Lyles  If pt is not established with a provider, would they like to be referred to a provider to establish care? No .   Dental Screening: Recommended annual dental exams for proper oral hygiene  Community Resource Referral / Chronic Care Management: CRR required this visit?  No   CCM required this visit?  No      Plan:     I have personally reviewed and noted the following in the patient's chart:   Medical and social history Use of alcohol, tobacco or illicit drugs  Current medications and supplements including opioid prescriptions. Patient is not currently taking opioid prescriptions. Functional ability and status Nutritional status Physical activity Advanced directives List of other physicians Hospitalizations, surgeries, and ER visits in previous 12 months Vitals Screenings to include cognitive, depression, and falls Referrals and appointments  In addition, I have reviewed and discussed with patient certain preventive protocols, quality metrics, and best practice recommendations. A written personalized care plan for preventive services as well as general preventive health recommendations were provided to patient.     Daphane Shepherd, LPN   [id number]   Nurse Notes: none

## 2022-09-04 NOTE — Patient Instructions (Signed)
Sara [name] , Thank you for taking time to come for your Medicare Wellness Visit. I appreciate your ongoing commitment to your health goals. Please review the following plan we discussed and let me know if I can assist you in the future.   These are the goals we discussed:  Goals       DIET - INCREASE WATER INTAKE      Exercise 3x per week (30 min per time)      Increase exercise as tolerated.       T2DM/HYPERLIPIDEMIA PHARMD GOAL (pt-stated)      Current Barriers:  Unable to achieve control of CHOLESTEROL & T2DM/BLOOD SUGAR   Pharmacist Clinical Goal(s):  patient will verbalize ability to afford treatment regimen achieve control of CHOLESTEROL as evidenced by  through collaboration with PharmD and provider.   Interventions: 1:1 collaboration with Janora Norlander, DO regarding development and update of comprehensive plan of care as evidenced by provider attestation and co-signature Inter-disciplinary care team collaboration (see longitudinal plan of care) Comprehensive medication review performed; medication list updated in electronic medical record   Diabetes: -A1c 6.3% but weight loss/cardiac protection needed -will INCREASE Ozempic to 36m weekly -25 lb weight loss  -patient approved for [employer] patient assistance program  -will increase to 134mweekly when patient assistance supply arrives -Denies personal and family history of Medullary thyroid cancer (MTC)   HYPERLIPIDEMIA Uncontrolled; current treatment: PRALUENT Q14 DAYS Intolerances --> rosuvastatin, ATORVASTATIN 80MG TWICE WEEKLY (patient has stopped due to side effects-asked her to discuss with cardiologist);  Significant cardiac history; follows with cardiology who also recommended PCSK9 Repatha not covered on insurace  [place]100/month (COVERED ON INSURANCE UNTIL 06/2022), HOWEVER patient would enter coverage gap CONTINUE [place]ENT [place] using grant foundation card for 1 year total Atorvastatin-patient has stopped, needs to discuss with cards HAVING MYOPATHY--DECREASE TO TWICE WEEKLY NOW ON PRALUENT Recheck lipids at PCP f/u next month  Lipid Panel     Component Value Date/Time   CHOL 147 09/19/2021 0828   TRIG 52 09/19/2021 0828   HDL 53 09/19/2021 0828   CHOLHDL 2.8 09/19/2021 0828   CHOLHDL 3.0 07/11/2016 1507   VLDL 13 07/11/2016 1507   LDLCALC 83 09/19/2021 0828   LABVLDL 11 09/19/2021 0828  Recommended continue praluent Assessed patient finances. APPLICATION FOR [employer] GRANT APPROVED --> GRANT RUNS OUT 06/2022  -Discussed with patient that praluent can help reduce her cardiac risk significantly while bringing LDL to goal -Control of T2DM/eating healthier diet will be very beneficial as well -->discussed lifestyle changes   Patient Goals/Self-Care Activities patient will:  - take medications as prescribed collaborate with provider on medication access solutions  Follow Up Plan: Telephone follow up appointment with care management team member scheduled for: 3 MONTHS       Weight (lb) < 200 lb (90.7 kg)      Eat 5 small Meals daily Eat plenty of fruits and vegetables Eat lean proteins  Drink plenty of Water.         This is a list of the screening recommended for you and due dates:  Health Maintenance  Topic Date Due   COVID-19 Vaccine (5 - 2023-24 season) 03/23/2022   Hemoglobin A1C  12/25/2022   Yearly kidney function blood test for diabetes  01/03/2023   Complete foot exam   01/03/2023   Eye exam for diabetics  06/19/2023   Yearly  kidney health urinalysis for diabetes  06/26/2023   Medicare Annual Wellness Visit  09/05/2023   DEXA scan (bone density measurement)  06/26/2024   DTaP/Tdap/Td vaccine (3 - Td or Tdap) 01/12/2031   Pneumonia Vaccine  Completed   Flu Shot  Completed   Hepatitis C Screening: USPSTF Recommendation to screen - Ages 18-79 yo.   Completed   Zoster (Shingles) Vaccine  Completed   HPV Vaccine  Aged Out   Colon Cancer Screening  Discontinued    Advanced directives: Advance directive discussed with you today. I have provided a copy for you to complete at home and have notarized. Once this is complete please bring a copy in to our office so we can scan it into your chart.   Conditions/risks identified: Aim for 30 minutes of exercise or brisk walking, 6-8 glasses of water, and 5 servings of fruits and vegetables each day.   Next appointment: Follow up in one year for your annual wellness visit    Preventive Care 65 Years and Older, Female Preventive care refers to lifestyle choices and visits with your health care provider that can promote health and wellness. What does preventive care include? A yearly physical exam. This is also called an annual well check. Dental exams once or twice a year. Routine eye exams. Ask your health care provider how often you should have your eyes checked. Personal lifestyle choices, including: Daily care of your teeth and gums. Regular physical activity. Eating a healthy diet. Avoiding tobacco and drug use. Limiting alcohol use. Practicing safe sex. Taking low-dose aspirin every day. Taking vitamin and mineral supplements as recommended by your health care provider. What happens during an annual well check? The services and screenings done by your health care provider during your annual well check will depend on your age, overall health, lifestyle risk factors, and family history of disease. Counseling  Your health care provider may ask you questions about your: Alcohol use. Tobacco use. Drug use. Emotional well-being. Home and relationship well-being. Sexual activity. Eating habits. History of falls. Memory and ability to understand (cognition). Work and work Statistician. Reproductive health. Screening  You may have the following tests or measurements: Height, weight,  and BMI. Blood pressure. Lipid and cholesterol levels. These may be checked every 5 years, or more frequently if you are over 21 years old. Skin check. Lung cancer screening. You may have this screening every year starting at age 29 if you have a 30-pack-year history of smoking and currently smoke or have quit within the past 15 years. Fecal occult blood test (FOBT) of the stool. You may have this test every year starting at age 1. Flexible sigmoidoscopy or colonoscopy. You may have a sigmoidoscopy every 5 years or a colonoscopy every 10 years starting at age 29. Hepatitis C blood test. Hepatitis B blood test. Sexually transmitted disease (STD) testing. Diabetes screening. This is done by checking your blood sugar (glucose) after you have not eaten for a while (fasting). You may have this done every 1-3 years. Bone density scan. This is done to screen for osteoporosis. You may have this done starting at age 49. Mammogram. This may be done every 1-2 years. Talk to your health care provider about how often you should have regular mammograms. Talk with your health care provider about your test results, treatment options, and if necessary, the need for more tests. Vaccines  Your health care provider may recommend certain vaccines, such as: Influenza vaccine. This is recommended every year. Tetanus, diphtheria,  and acellular pertussis (Tdap, Td) vaccine. You may need a Td booster every 10 years. Zoster vaccine. You may need this after age 24. Pneumococcal 13-valent conjugate (PCV13) vaccine. One dose is recommended after age 65. Pneumococcal polysaccharide (PPSV23) vaccine. One dose is recommended after age 25. Talk to your health care provider about which screenings and vaccines you need and how often you need them. This information is not intended to replace advice given to you by your health care provider. Make sure you discuss any questions you have with your health care provider. Document  Released: 08/05/2015 Document Revised: 03/28/2016 Document Reviewed: 05/10/2015 Elsevier Interactive Patient Education  2017 [place] Prevention in the Home Falls can cause injuries. They can happen to people of all ages. There are many things you can do to make your home safe and to help prevent falls. What can I do on the outside of my home? Regularly fix the edges of walkways and driveways and fix any cracks. Remove anything that might make you trip as you walk through a door, such as a raised step or threshold. Trim any bushes or trees on the path to your home. Use bright outdoor lighting. Clear any walking paths of anything that might make someone trip, such as rocks or tools. Regularly check to see if handrails are loose or broken. Make sure that both sides of any steps have handrails. Any raised decks and porches should have guardrails on the edges. Have any leaves, snow, or ice cleared regularly. Use sand or salt on walking paths during winter. Clean up any spills in your garage right away. This includes oil or grease spills. What can I do in the bathroom? Use night lights. Install grab bars by the toilet and in the tub and shower. Do not use towel bars as grab bars. Use non-skid mats or decals in the tub or shower. If you need to sit down in the shower, use a plastic, non-slip stool. Keep the floor dry. Clean up any water that spills on the floor as soon as it happens. Remove soap buildup in the tub or shower regularly. Attach bath mats securely with double-sided non-slip rug tape. Do not have throw rugs and other things on the floor that can make you trip. What can I do in the bedroom? Use night lights. Make sure that you have a light by your bed that is easy to reach. Do not use any sheets or blankets that are too big for your bed. They should not hang down onto the floor. Have a firm chair that has side arms. You can use this for support while you get dressed. Do  not have throw rugs and other things on the floor that can make you trip. What can I do in the kitchen? Clean up any spills right away. Avoid walking on wet floors. Keep items that you use a lot in easy-to-reach places. If you need to reach something above you, use a strong step stool that has a grab bar. Keep electrical cords out of the way. Do not use floor polish or wax that makes floors slippery. If you must use wax, use non-skid floor wax. Do not have throw rugs and other things on the floor that can make you trip. What can I do with my stairs? Do not leave any items on the stairs. Make sure that there are handrails on both sides of the stairs and use them. Fix handrails that are broken or loose. Make sure  that handrails are as long as the stairways. Check any carpeting to make sure that it is firmly attached to the stairs. Fix any carpet that is loose or worn. Avoid having throw rugs at the top or bottom of the stairs. If you do have throw rugs, attach them to the floor with carpet tape. Make sure that you have a light switch at the top of the stairs and the bottom of the stairs. If you do not have them, ask someone to add them for you. What else can I do to help prevent falls? Wear shoes that: Do not have high heels. Have rubber bottoms. Are comfortable and fit you well. Are closed at the toe. Do not wear sandals. If you use a stepladder: Make sure that it is fully opened. Do not climb a closed stepladder. Make sure that both sides of the stepladder are locked into place. Ask someone to hold it for you, if possible. Clearly mark and make sure that you can see: Any grab bars or handrails. First and last steps. Where the edge of each step is. Use tools that help you move around (mobility aids) if they are needed. These include: Canes. Walkers. Scooters. Crutches. Turn on the lights when you go into a dark area. Replace any light bulbs as soon as they burn out. Set up your  furniture so you have a clear path. Avoid moving your furniture around. If any of your floors are uneven, fix them. If there are any pets around you, be aware of where they are. Review your medicines with your doctor. Some medicines can make you feel dizzy. This can increase your chance of falling. Ask your doctor what other things that you can do to help prevent falls. This information is not intended to replace advice given to you by your health care provider. Make sure you discuss any questions you have with your health care provider. Document Released: 05/05/2009 Document Revised: 12/15/2015 Document Reviewed: 08/13/2014 Elsevier Interactive Patient Education  2017 [employer].

## 2022-09-13 ENCOUNTER — Telehealth: Payer: Self-pay | Admitting: Pharmacist

## 2022-09-13 NOTE — Telephone Encounter (Signed)
PATIENT MADE AWARE OF OZEMPIC 1MG PAP SHIPMENT DOING WELL ON CURRENT REGIMEN CONTINUE CURRENT THERAPY F/U WITH PHARMD IN 6 MONTHS

## 2022-09-21 ENCOUNTER — Telehealth: Payer: Self-pay | Admitting: Family Medicine

## 2022-09-21 NOTE — Telephone Encounter (Signed)
Sara [name], can you check on this please.

## 2022-09-24 NOTE — Telephone Encounter (Signed)
Pt calling again this am for an update.

## 2022-09-24 NOTE — Telephone Encounter (Signed)
I'm ccing CCM. I have no idea about this.

## 2022-09-25 ENCOUNTER — Telehealth: Payer: Self-pay | Admitting: Pharmacist

## 2022-09-25 DIAGNOSIS — E785 Hyperlipidemia, unspecified: Secondary | ICD-10-CM

## 2022-09-25 DIAGNOSIS — G720 Drug-induced myopathy: Secondary | ICD-10-CM

## 2022-09-25 NOTE — Telephone Encounter (Signed)
Returned call to patient  Repatha sample to bridge praluent supply Copay card/voucher system is down [employer] active until 04/2023

## 2022-10-05 DIAGNOSIS — R42 Dizziness and giddiness: Secondary | ICD-10-CM | POA: Diagnosis not present

## 2022-10-05 DIAGNOSIS — J309 Allergic rhinitis, unspecified: Secondary | ICD-10-CM | POA: Diagnosis not present

## 2022-10-05 DIAGNOSIS — R69 Illness, unspecified: Secondary | ICD-10-CM | POA: Diagnosis not present

## 2022-10-05 DIAGNOSIS — E119 Type 2 diabetes mellitus without complications: Secondary | ICD-10-CM | POA: Diagnosis not present

## 2022-10-05 DIAGNOSIS — I2510 Atherosclerotic heart disease of native coronary artery without angina pectoris: Secondary | ICD-10-CM | POA: Diagnosis not present

## 2022-10-05 DIAGNOSIS — Z008 Encounter for other general examination: Secondary | ICD-10-CM | POA: Diagnosis not present

## 2022-10-05 DIAGNOSIS — R609 Edema, unspecified: Secondary | ICD-10-CM | POA: Diagnosis not present

## 2022-10-05 DIAGNOSIS — F419 Anxiety disorder, unspecified: Secondary | ICD-10-CM | POA: Diagnosis not present

## 2022-10-05 DIAGNOSIS — K219 Gastro-esophageal reflux disease without esophagitis: Secondary | ICD-10-CM | POA: Diagnosis not present

## 2022-10-05 DIAGNOSIS — M810 Age-related osteoporosis without current pathological fracture: Secondary | ICD-10-CM | POA: Diagnosis not present

## 2022-10-05 DIAGNOSIS — I10 Essential (primary) hypertension: Secondary | ICD-10-CM | POA: Diagnosis not present

## 2022-10-05 DIAGNOSIS — E785 Hyperlipidemia, unspecified: Secondary | ICD-10-CM | POA: Diagnosis not present

## 2022-10-05 DIAGNOSIS — E876 Hypokalemia: Secondary | ICD-10-CM | POA: Diagnosis not present

## 2022-10-26 ENCOUNTER — Telehealth: Payer: Self-pay | Admitting: Family Medicine

## 2022-10-26 DIAGNOSIS — I2510 Atherosclerotic heart disease of native coronary artery without angina pectoris: Secondary | ICD-10-CM

## 2022-10-26 DIAGNOSIS — E1169 Type 2 diabetes mellitus with other specified complication: Secondary | ICD-10-CM

## 2022-10-26 NOTE — Telephone Encounter (Signed)
Pt called stating that she can't get her REPATHA Rx refilled at the pharmacy because they told her that she needed to contact her PCP office about refills.  Did not see Repatha listed on her current med list. Pt says she knows that is what she is taking because her PRALUENT was switched to REPATHA.  Made pt aware that Dr Nadine Counts is not here today to advise on this. Pt wants to know if covering provider can because she is completely out of her medicine and needs refill today.

## 2022-10-29 NOTE — Telephone Encounter (Signed)
It looks like Praluent is on her current med list but I see a telephone encounter where pt was given a sample of Repatha to bridge her? Which one should pt be on

## 2022-10-29 NOTE — Telephone Encounter (Signed)
Praluent.  She gave her something just to bridge her while she was waiting for PAP to go through per the notes I can see in the chart.  Will cc Raynelle Fanning as a double check.

## 2022-10-30 MED ORDER — REPATHA SURECLICK 140 MG/ML ~~LOC~~ SOAJ: 140.0000 mg | SUBCUTANEOUS | 5 refills | Status: DC

## 2022-10-30 NOTE — Telephone Encounter (Signed)
Left VM explaining RX was sent to [facility] Patient to use healthwell grant copay card to make $0

## 2022-10-30 NOTE — Telephone Encounter (Signed)
Insurance now prefers repatha will send in RX to the pharmacy

## 2022-11-01 ENCOUNTER — Other Ambulatory Visit (HOSPITAL_COMMUNITY): Payer: Self-pay

## 2022-11-01 NOTE — Telephone Encounter (Signed)
Patient Advocate Encounter  Prior Authorization for Repatha SureClick 140MG /ML auto-injectors has been approved.    Effective dates: 07/23/22 through 07/23/23

## 2022-11-01 NOTE — Telephone Encounter (Signed)
Called and made pt aware.

## 2022-11-01 NOTE — Telephone Encounter (Signed)
Pharmacy Patient Advocate Encounter   Received notification from [facility] that prior authorization for Repatha SureClick 140MG /ML auto-injectors is required/requested.  Per Test Claim: PA required   PA submitted on 11/01/22 to (ins) [employer] via CoverMyMeds Key or (Medicaid) confirmation # [id number] Status is pending

## 2022-11-01 NOTE — Telephone Encounter (Signed)
  Please label repatha (in [facility] fridge) #2 boxes and call patient to pick up

## 2022-11-01 NOTE — Telephone Encounter (Signed)
TC [facility] to pt [facility] Rep[facility], it needs a PA, sending message to the PA team  In the meantime, pt has been out of medication for 7 days, Raynelle Fanning does she need to restart her cholesterol medication, please let her know.

## 2022-11-20 NOTE — Telephone Encounter (Signed)
    09/25/2022 Name: Sara [name] MRN: [id number] DOB: [birth year]   PA for Repatha completed by PharmD and approved for 2024.  Patient to use healthwell foundation grant to pay for Repatha at the local drug store.  Patient was previously on Praluent, however insurance now prefers Repatha.   Kieth Brightly, PharmD, BCACP Clinical Pharmacist, [facility]

## 2023-01-07 ENCOUNTER — Telehealth: Payer: Self-pay | Admitting: Family Medicine

## 2023-01-08 NOTE — Telephone Encounter (Signed)
Please let patient know that her Ozempic is ready for pick up in the lab fridge He name is labeled on the bag Thank you!

## 2023-01-08 NOTE — Telephone Encounter (Signed)
Patient aware.

## 2023-01-10 DIAGNOSIS — Z1283 Encounter for screening for malignant neoplasm of skin: Secondary | ICD-10-CM | POA: Diagnosis not present

## 2023-01-10 DIAGNOSIS — D225 Melanocytic nevi of trunk: Secondary | ICD-10-CM | POA: Diagnosis not present

## 2023-03-06 ENCOUNTER — Telehealth: Payer: Self-pay | Admitting: *Deleted

## 2023-03-06 NOTE — Telephone Encounter (Signed)
I connected with Sara [name] on 8/14 at 1042 by telephone and verified that I am speaking with the correct person using two identifiers. According to the patient's chart they are due for follow up with  [facility]. Pt scheduled. There are no transportation issues at this time. Nothing further was needed at the end of our conversation.

## 2023-04-02 ENCOUNTER — Telehealth: Payer: Self-pay

## 2023-04-02 ENCOUNTER — Ambulatory Visit (INDEPENDENT_AMBULATORY_CARE_PROVIDER_SITE_OTHER): Payer: Medicare HMO

## 2023-04-02 DIAGNOSIS — Z23 Encounter for immunization: Secondary | ICD-10-CM

## 2023-04-02 NOTE — Telephone Encounter (Signed)
Patient aware we have received four boxes of Ozempic and they will be placed in the refrigerator for her to pick up.

## 2023-04-02 NOTE — Telephone Encounter (Signed)
Patient came in today for flu vaccine and wanted to see you but you are on vacation, told her you will be back next week.  She would like to go ahead and start getting ready and applying for patient assistance for her medications for next  year.  Please call her when you get back.

## 2023-04-29 ENCOUNTER — Other Ambulatory Visit: Payer: Self-pay | Admitting: Family Medicine

## 2023-04-29 DIAGNOSIS — J019 Acute sinusitis, unspecified: Secondary | ICD-10-CM

## 2023-05-20 ENCOUNTER — Telehealth: Payer: Self-pay | Admitting: Family Medicine

## 2023-05-20 ENCOUNTER — Encounter: Payer: Self-pay | Admitting: Family Medicine

## 2023-05-20 ENCOUNTER — Ambulatory Visit (INDEPENDENT_AMBULATORY_CARE_PROVIDER_SITE_OTHER): Payer: Medicare HMO | Admitting: Family Medicine

## 2023-05-20 VITALS — BP 104/61 | HR 76 | Temp 98.3°F | Ht 64.0 in | Wt 224.0 lb

## 2023-05-20 DIAGNOSIS — I2510 Atherosclerotic heart disease of native coronary artery without angina pectoris: Secondary | ICD-10-CM

## 2023-05-20 DIAGNOSIS — E785 Hyperlipidemia, unspecified: Secondary | ICD-10-CM

## 2023-05-20 DIAGNOSIS — E1169 Type 2 diabetes mellitus with other specified complication: Secondary | ICD-10-CM | POA: Diagnosis not present

## 2023-05-20 DIAGNOSIS — Z636 Dependent relative needing care at home: Secondary | ICD-10-CM | POA: Diagnosis not present

## 2023-05-20 DIAGNOSIS — E119 Type 2 diabetes mellitus without complications: Secondary | ICD-10-CM

## 2023-05-20 DIAGNOSIS — E1159 Type 2 diabetes mellitus with other circulatory complications: Secondary | ICD-10-CM

## 2023-05-20 DIAGNOSIS — Z7985 Long-term (current) use of injectable non-insulin antidiabetic drugs: Secondary | ICD-10-CM | POA: Diagnosis not present

## 2023-05-20 DIAGNOSIS — M810 Age-related osteoporosis without current pathological fracture: Secondary | ICD-10-CM

## 2023-05-20 DIAGNOSIS — I152 Hypertension secondary to endocrine disorders: Secondary | ICD-10-CM | POA: Diagnosis not present

## 2023-05-20 LAB — BAYER DCA HB A1C WAIVED: HB A1C (BAYER DCA - WAIVED): 5.3 % (ref 4.8–5.6)

## 2023-05-20 MED ORDER — ALENDRONATE SODIUM 70 MG PO TABS: ORAL_TABLET | ORAL | 3 refills | Status: DC

## 2023-05-20 MED ORDER — POTASSIUM CHLORIDE ER 10 MEQ PO TBCR: 10.0000 meq | EXTENDED_RELEASE_TABLET | Freq: Every day | ORAL | 3 refills | Status: DC

## 2023-05-20 MED ORDER — FUROSEMIDE 20 MG PO TABS: 20.0000 mg | ORAL_TABLET | Freq: Every day | ORAL | 3 refills | Status: DC

## 2023-05-20 MED ORDER — SPIRONOLACTONE 25 MG PO TABS: 12.5000 mg | ORAL_TABLET | Freq: Two times a day (BID) | ORAL | 3 refills | Status: DC

## 2023-05-20 MED ORDER — OMEPRAZOLE 20 MG PO CPDR: 20.0000 mg | DELAYED_RELEASE_CAPSULE | Freq: Every day | ORAL | 3 refills | Status: DC

## 2023-05-20 MED ORDER — LOSARTAN POTASSIUM 25 MG PO TABS: 25.0000 mg | ORAL_TABLET | Freq: Every day | ORAL | 3 refills | Status: DC

## 2023-05-20 MED ORDER — DILTIAZEM HCL ER COATED BEADS 120 MG PO CP24: ORAL_CAPSULE | ORAL | 3 refills | Status: DC

## 2023-05-20 NOTE — Patient Instructions (Signed)
CUT losartan to just ONE tablet per day. Your blood pressure is too low.

## 2023-05-20 NOTE — Progress Notes (Signed)
Subjective: CC:DM PCP: Raliegh Ip, DO DGL:OVFIE Sara [name] is a 77 y.o. female presenting to clinic today for:  1. Type 2 Diabetes with hypertension, hyperlipidemia associated with CAD:  She reports compliance with Ozempic 1 mg weekly.  Sometimes she will go up to 8 days without injecting because she sometimes feels a little nauseated.  She is compliant with her Repatha, losartan, spironolactone, Cardizem and Lasix.  She reports no chest pain, shortness of breath, dizziness or falls.  Has lost about 40 pounds since initiation of the GLP.  Diabetes Health Maintenance Due  Topic Date Due   HEMOGLOBIN A1C  12/25/2022   FOOT EXAM  01/03/2023   OPHTHALMOLOGY EXAM  06/19/2023    Last A1c:  Lab Results  Component Value Date   HGBA1C 5.7 (H) 06/25/2022   2.  Stress at home She has caregiver stress.  She cares for her sister, Rinaldo Cloud Case, who suffers from dementia.  She has not used any alprazolam.  ROS: Per HPI  Allergies  Allergen Reactions   Latex Other (See Comments)   Nifedipine Other (See Comments)    Brings blood pressure up really fast PROCARDIA   Tape     bleeding   Codeine Itching   Crestor [Rosuvastatin] Other (See Comments)    myalgias   Past Medical History:  Diagnosis Date   Arthritis    Arthritis -hands -Bil. 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 s/p Xience DES x 3 to LAD;  b. nuc study 02/27/10: EF 67% no ischemia;  c.  echo 11/12: Mild LVH, EF 55-65%, grade 1 diastolic dysfunction, moderate LAE;  d.  LHC 07/03/11: LAD stents patent, RCA 25%, EF 55-65% ;  e. [place]. Myoview 5/14:  No ischemia, EF 68%   COVID-19 2021   Depression    Diverticular disease    GERD (gastroesophageal reflux disease)    Hemorrhoid    Hepatitis    yellow jaundice as a child    HTN (hypertension)    Hyperlipidemia    Hypertension    Impaired hearing    bilateral-  no hearing aids   Myocardial infarction (HCC)    hx of x 2 3-4 years ago per pt   Obesity    OSA (obstructive sleep apnea)    needs no cpap per patient pt denies   Other psoriasis    ongoing- none at present.   Vertigo     Current Outpatient Medications:    albuterol (VENTOLIN HFA) 108 (90 Base) MCG/ACT inhaler, TAKE 2 PUFFS BY MOUTH EVERY 6 HOURS AS NEEDED FOR WHEEZE OR SHORTNESS OF BREATH (Patient taking differently: Inhale 2 puffs into the lungs every 6 (six) hours as needed for wheezing or shortness of breath.), Disp: 18 each, Rfl: 1   alendronate (FOSAMAX) 70 MG tablet, TAKE 1 TABLET EVERY 7 DAYS WITH A FULL GLASS OF WATER ON AN EMPTY STOMACH, Disp: 12 tablet, Rfl: 3   ALPRAZolam (XANAX) 0.5 MG tablet, TAKE 1 TABLET BY MOUTH TWICE A DAY AS NEEDED FOR ANXIETY, Disp: 60 tablet, Rfl: 0   aspirin EC 81 MG tablet, Take 81 mg by mouth daily., Disp: , Rfl:    betamethasone valerate ointment (VALISONE) 0.1 %, Apply 1 application topically 2 (two) times daily as needed (psoriasis). (DO NOT apply to face, groin or axilla), Disp: 45 g, Rfl: 2  budesonide-formoterol (SYMBICORT) 80-4.5 MCG/ACT inhaler, Inhale 2 puffs into the lungs 2 (two) times daily., Disp: 1 each, Rfl: 3   Calcium Carbonate-Vitamin D (SUPER CALCIUM 600 + D3 PO), Take 1 tablet by mouth daily., Disp: , Rfl:    cyclobenzaprine (FLEXERIL) 5 MG tablet, TAKE 1 TABLET BY MOUTH THREE TIMES A DAY AS NEEDED FOR MUSCLE SPASMS, Disp: 30 tablet, Rfl: 0   diltiazem (CARDIZEM CD) 120 MG 24 hr capsule, TAKE 1 CAPSULE BY MOUTH EVERY DAY, Disp: 90 capsule, Rfl: 3   Evolocumab (REPATHA SURECLICK) 140 MG/ML SOAJ, Inject 140 mg into the skin every 14 (fourteen) days., Disp: 2 mL, Rfl: 5   furosemide (LASIX) 20 MG tablet, Take 1 tablet (20 mg total) by mouth daily., Disp: 90 tablet, Rfl: 3   losartan (COZAAR) 25 MG tablet, Take 1 tablet (25 mg total) by mouth 2 (two) times daily., Disp: 180 tablet, Rfl: 3   meclizine (ANTIVERT) 25 MG tablet, Take 1  tablet (25 mg total) by mouth 2 (two) times daily as needed for dizziness. Reported on 12/05/2015, Disp: 30 tablet, Rfl: 1   mometasone (NASONEX) 50 MCG/ACT nasal spray, Place 2 sprays into the nose daily., Disp: 1 each, Rfl: 12   nitroGLYCERIN (NITROSTAT) 0.4 MG SL tablet, Place 1 tablet (0.4 mg total) under the tongue every 5 (five) minutes as needed for chest pain (x 3 doses). Reported on 12/05/2015, Disp: 25 tablet, Rfl: 1   omeprazole (PRILOSEC) 20 MG capsule, Take 1 capsule (20 mg total) by mouth daily., Disp: 90 capsule, Rfl: 3   potassium chloride (KLOR-CON) 10 MEQ tablet, Take 1 tablet (10 mEq total) by mouth daily., Disp: 90 tablet, Rfl: 3   Semaglutide, 1 MG/DOSE, 4 MG/3ML SOPN, Inject 1 mg as directed once a week., Disp: 3 mL, Rfl: 0   spironolactone (ALDACTONE) 25 MG tablet, Take 0.5 tablets (12.5 mg total) by mouth 2 (two) times daily., Disp: 90 tablet, Rfl: 3 Social History   Socioeconomic History   Marital status: Married    Spouse name: [name]   Number of children: 3   Years of education: Not on file   Highest education level: Not on file  Occupational History   Occupation: hairdresser  Tobacco Use   Smoking status: Never   Smokeless tobacco: Never  Vaping Use   Vaping status: Never Used  Substance and Sexual Activity   Alcohol use: No   Drug use: No   Sexual activity: Not Currently    Birth control/protection: Post-menopausal  Other Topics Concern   Not on file  Social History Narrative   Lives with husband, [name].    1 son died in June 02, 2018 from ALS.    Social Determinants of Health   Financial Resource Strain: Low Risk  (09/04/2022)   Overall Financial Resource Strain (CARDIA)    Difficulty of Paying Living Expenses: Not hard at all  Food Insecurity: No Food Insecurity (09/04/2022)   Hunger Vital Sign    Worried About Running Out of Food in the Last Year: Never true    Ran Out of Food in the Last Year: Never true  Transportation Needs: No Transportation Needs  (03/06/2023)   PRAPARE - Administrator, Civil Service (Medical): No    Lack of Transportation (Non-Medical): No  Physical Activity: Inactive (09/04/2022)   Exercise Vital Sign    Days of Exercise per Week: 0 days    Minutes of Exercise per Session: 0 min  Stress: No Stress Concern Present (09/04/2022)  [employer] of Occupational Health - Occupational Stress Questionnaire    Feeling of Stress : Not at all  Social Connections: Socially Integrated (09/04/2022)   Social Connection and Isolation Panel [NHANES]    Frequency of Communication with Friends and Family: More than three times a week    Frequency of Social Gatherings with Friends and Family: More than three times a week    Attends Religious Services: More than 4 times per year    Active Member of [employer] or Organizations: Yes    Attends Engineer, structural: More than 4 times per year    Marital Status: Married  Catering manager Violence: Not At Risk (09/04/2022)   Humiliation, Afraid, Rape, and Kick questionnaire    Fear of Current or Ex-Partner: No    Emotionally Abused: No    Physically Abused: No    Sexually Abused: No   Family History  Problem Relation Age of Onset   Stroke Mother    Hypertension Mother    Heart failure Mother    Breast cancer Mother    Aneurysm Father    Seizures Brother    Muscular dystrophy Daughter        dx as infant, passed age 54   Seizures Son    Colon cancer Neg Hx    Esophageal cancer Neg Hx    Rectal cancer Neg Hx    Stomach cancer Neg Hx     Objective: Office vital signs reviewed. BP 104/61   Pulse 76   Temp 98.3 F (36.8 C)   Ht 5\' 4"  (1.626 m)   Wt 224 lb (101.6 kg)   SpO2 93%   BMI 38.45 kg/m   Physical Examination:  General: Awake, alert, well nourished, No acute distress HEENT: Sclera white.  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  Onychomycotic changes through bilateral nails      Assessment/ Plan: 77 y.o. female   Diabetes mellitus treated with injections of non-insulin medication (HCC) - Plan: Bayer DCA Hb A1c Waived, Microalbumin / creatinine urine ratio  Hyperlipidemia associated with type 2 diabetes mellitus (HCC) - Plan: Lipid Panel, CMP14+EGFR, TSH  Hypertension associated with diabetes (HCC) - Plan: losartan (COZAAR) 25 MG tablet, diltiazem (CARDIZEM CD) 120 MG 24 hr capsule, furosemide (LASIX) 20 MG tablet, potassium chloride (KLOR-CON) 10 MEQ tablet, spironolactone (ALDACTONE) 25 MG tablet  Coronary artery disease involving native coronary artery of native heart without angina pectoris  Age-related osteoporosis without current pathological fracture - Plan: CMP14+EGFR, VITAMIN D 25 Hydroxy (Vit-D Deficiency, Fractures), alendronate (FOSAMAX) 70 MG tablet  Caregiver stress   Nonfasting labs collected today.  Continue current regimen.  Medications have been renewed.  Diabetic foot exam performed today.  Will reduce her blood pressure regimen to 25 mg of Cozaar daily as her blood pressure is borderline low.  May need to consider reducing her spironolactone as well to perhaps once daily  Continue Repatha for cholesterol  Follow-up with cardiology as scheduled for CAD.  Continue all medications as prescribed  Fosamax renewed.  Check vitamin D, calcium, renal function  With regards to caregiver stress, supportive care recommended.  Will offer referral to CSW if needed  going forward  Raliegh Ip, DO [facility] [phone]

## 2023-05-21 ENCOUNTER — Other Ambulatory Visit: Payer: Self-pay | Admitting: Family Medicine

## 2023-05-21 DIAGNOSIS — Z1231 Encounter for screening mammogram for malignant neoplasm of breast: Secondary | ICD-10-CM

## 2023-05-21 DIAGNOSIS — E1169 Type 2 diabetes mellitus with other specified complication: Secondary | ICD-10-CM

## 2023-05-21 DIAGNOSIS — I2510 Atherosclerotic heart disease of native coronary artery without angina pectoris: Secondary | ICD-10-CM

## 2023-05-21 LAB — CMP14+EGFR
ALT: 14 [IU]/L (ref 0–32)
AST: 23 [IU]/L (ref 0–40)
Albumin: 3.8 g/dL (ref 3.8–4.8)
Alkaline Phosphatase: 62 [IU]/L (ref 44–121)
BUN/Creatinine Ratio: 16 (ref 12–28)
BUN: 12 mg/dL (ref 8–27)
Bilirubin Total: 0.9 mg/dL (ref 0.0–1.2)
CO2: 21 mmol/L (ref 20–29)
Calcium: 9.5 mg/dL (ref 8.7–10.3)
Chloride: 104 mmol/L (ref 96–106)
Creatinine, Ser: 0.73 mg/dL (ref 0.57–1.00)
Globulin, Total: 2.6 g/dL (ref 1.5–4.5)
Glucose: 107 mg/dL — ABNORMAL HIGH (ref 70–99)
Potassium: 4.1 mmol/L (ref 3.5–5.2)
Sodium: 139 mmol/L (ref 134–144)
Total Protein: 6.4 g/dL (ref 6.0–8.5)
eGFR: 85 mL/min/{1.73_m2} (ref >59)

## 2023-05-21 LAB — LIPID PANEL
Chol/HDL Ratio: 2.6 ratio (ref 0.0–4.4)
Cholesterol, Total: 143 mg/dL (ref 100–199)
HDL: 54 mg/dL (ref >39)
LDL Chol Calc (NIH): 72 mg/dL (ref 0–99)
Triglycerides: 88 mg/dL (ref 0–149)
VLDL Cholesterol Cal: 17 mg/dL (ref 5–40)

## 2023-05-21 LAB — TSH: TSH: 1.38 u[IU]/mL (ref 0.450–4.500)

## 2023-05-21 LAB — VITAMIN D 25 HYDROXY (VIT D DEFICIENCY, FRACTURES): Vit D, 25-Hydroxy: 93.3 ng/mL (ref 30.0–100.0)

## 2023-05-22 NOTE — Progress Notes (Signed)
05/23/2023 Name: Sara [name] MRN: [id number] DOB: 02-23-1946  Chief Complaint  Patient presents with   Diabetes    Sara [name] is a 77 y.o. year old female who presented for a telephone visit.  I connected with  Sara [name] on 05/23/23 by telephone and verified that I am speaking with the correct person using two identifiers.  I discussed the limitations of evaluation and management by telemedicine. The patient expressed understanding and agreed to proceed.  Patient was located in her home and PharmD in PCP office during this visit.   They were referred to the pharmacist by their PCP for assistance in managing hyperlipidemia, diabetes, and medication access.   She is on Repatha for hyperlipidemia. She is able to afford this medication due to [employer]. She was contacted today to discuss renewal. Has her husband check her blood sugar once and a while, but not very often. She denies any hyperglycemia or hypoglycemia. She occasionally has some nausea with Ozempic but it is manageable.    Subjective:  Care Team: Primary Care Provider: Raliegh Ip, DO ; Next Scheduled Visit: 09/11/2023  Medication Access/Adherence  Current Pharmacy:  [phone] [place] [place] - [address] [place] [address] Phone: [phone] Fax: [phone]  [phone] [place] [place] - [address] [place] [address] Phone: [phone] Fax: [phone]   Patient reports affordability concerns with their medications: Yes  Patient reports access/transportation concerns to their pharmacy: No  Patient reports adherence concerns with their medications:  No     Diabetes:  Current medications:  -Ozempic 1 mg weekly (Novo Nordisk PAP)  Patient denies hypoglycemic s/sx including dizziness, shakiness, sweating. Patient denies hyperglycemic symptoms including polyuria, polydipsia, polyphagia,  nocturia, neuropathy, blurred vision.  Current medication access support: [employer] (Repatha) and [employer] PAP (Ozempic)  Hyperlipidemia/ASCVD Risk Reduction  Current lipid lowering medications:  -Repatha 140 mg every 14 days   Medications tried in the past:  -Rosuvastatin (myalgias)  Antiplatelet regimen:   ASCVD History: She had PCI to the LAD Family History:  Risk Factors:   Current medication access support: [employer] (Repatha); Novo Nordisk (Ozempic)  Objective:  Lab Results  Component Value Date   HGBA1C 5.3 05/20/2023    Lab Results  Component Value Date   CREATININE 0.73 05/20/2023   BUN 12 05/20/2023   NA 139 05/20/2023   K 4.1 05/20/2023   CL 104 05/20/2023   CO2 21 05/20/2023    Lab Results  Component Value Date   CHOL 143 05/20/2023   HDL 54 05/20/2023   LDLCALC 72 05/20/2023   TRIG 88 05/20/2023   CHOLHDL 2.6 05/20/2023    Medications Reviewed Today     Reviewed by [name], RPH (Pharmacist) on 05/23/23 at [phone]  Med List Status: <None>   Medication Order Taking? Sig Documenting Provider Last Dose Status Informant  albuterol (VENTOLIN HFA) 108 (90 Base) MCG/ACT inhaler [id number] No TAKE 2 PUFFS BY MOUTH EVERY 6 HOURS AS NEEDED FOR WHEEZE OR SHORTNESS OF BREATH  Patient taking differently: Inhale 2 puffs into the lungs every 6 (six) hours as needed for wheezing or shortness of breath.   Delynn Flavin M, DO Taking Active   alendronate (FOSAMAX) 70 MG tablet [id number]  TAKE 1 TABLET EVERY 7 DAYS WITH A FULL GLASS OF WATER ON AN EMPTY STOMACH Delynn Flavin M, DO  Active   aspirin EC  81 MG tablet [id number] No Take 81 mg by mouth daily. [provider] Taking Active Self  betamethasone valerate ointment (VALISONE) 0.1 % [id number] No Apply 1 application topically 2 (two) times daily as needed (psoriasis). (DO NOT apply to face, groin or axilla) Raliegh Ip, DO Taking Active   budesonide-formoterol  (SYMBICORT) 80-4.5 MCG/ACT inhaler [id number] No Inhale 2 puffs into the lungs 2 (two) times daily. Raliegh Ip, DO Taking Active Self           Med Note Donn Pierini Aug 31, 2021  2:03 PM) Via [place] patient [place]   Calcium Carbonate-Vitamin D (SUPER CALCIUM 600 + D3 PO) [id number] No Take 1 tablet by mouth daily. [provider] Taking Active Self  cyclobenzaprine (FLEXERIL) 5 MG tablet [id number] No TAKE 1 TABLET BY MOUTH THREE TIMES A DAY AS NEEDED FOR MUSCLE SPASMS Hawks, Christy A, FNP Taking Active   diltiazem (CARDIZEM CD) 120 MG 24 hr capsule [id number]  TAKE 1 CAPSULE BY MOUTH EVERY DAY Delynn Flavin M, DO  Active   Evolocumab (REPATHA SURECLICK) 140 MG/ML SOAJ [id number]  INJECT 140 MG INTO THE SKIN EVERY 14 (FOURTEEN) DAYS. CANCEL PRALEUNT RX Delynn Flavin M, DO  Active   furosemide (LASIX) 20 MG tablet [id number]  Take 1 tablet (20 mg total) by mouth daily. Delynn Flavin M, DO  Active   losartan (COZAAR) 25 MG tablet [id number]  Take 1 tablet (25 mg total) by mouth daily. Delynn Flavin M, DO  Active   meclizine (ANTIVERT) 25 MG tablet [id number] No Take 1 tablet (25 mg total) by mouth 2 (two) times daily as needed for dizziness. Reported on 12/05/2015 Junie Spencer, FNP Taking Active Self  mometasone (NASONEX) 50 MCG/ACT nasal spray [id number] No Place 2 sprays into the nose daily. Delynn Flavin M, DO Taking Active   nitroGLYCERIN (NITROSTAT) 0.4 MG SL tablet [id number] No Place 1 tablet (0.4 mg total) under the tongue every 5 (five) minutes as needed for chest pain (x 3 doses). Reported on 12/05/2015 Raliegh Ip, DO Taking Active   omeprazole (PRILOSEC) 20 MG capsule [id number]  Take 1 capsule (20 mg total) by mouth daily. Delynn Flavin M, DO  Active   potassium chloride (KLOR-CON) 10 MEQ tablet [id number]  Take 1 tablet (10 mEq total) by mouth daily. Raliegh Ip, DO  Active   Semaglutide, 1 MG/DOSE, 4 MG/3ML SOPN  [id number] No Inject 1 mg as directed once a week. Delynn Flavin M, DO Taking Active   spironolactone (ALDACTONE) 25 MG tablet [id number]  Take 0.5 tablets (12.5 mg total) by mouth 2 (two) times daily. Delynn Flavin M, DO  Active               Assessment/Plan:   Diabetes: - Currently controlled. A1c 5.3%. (goal <7). - Reviewed long term cardiovascular and renal outcomes of uncontrolled blood sugar - Reviewed goal A1c, goal fasting, and goal 2 hour post prandial glucose -  Recommend to continue Ozempic 1 mg weekly.   - Meets financial criteria for [employer] patient assistance program through 06/2023. Will collaborate with provider, CPhT, and patient to pursue PAP re-enrollment for 2025.  -Patient was advised to look out for 2025 PAP re-enrollment application and to call the clinic if she has not received it in the mail in the next 2 weeks. Once the application is submitted she should expect a decision by end of November 2024.    Hyperlipidemia/ASCVD Risk Reduction: - Currently  controlled.  - Reviewed long term complications of uncontrolled cholesterol - Recommend to Repatha 140 mg every 14 days.   - Meets financial criteria for [employer] through 2024 and has been approved for 2025. The [facility] [place] [facility].     Follow Up Plan: She will call the clinic with any questions regarding re-enrollment.  20 min of patient care was provided to the patient during this visit time    Sofie Rower, PharmD [facility] PGY1   Kieth Brightly, PharmD, BCACP, CPP Clinical Pharmacist, [facility]

## 2023-05-22 NOTE — Telephone Encounter (Signed)
Please schedule tele

## 2023-05-23 ENCOUNTER — Other Ambulatory Visit: Payer: Medicare HMO | Admitting: Pharmacist

## 2023-05-23 ENCOUNTER — Telehealth: Payer: Self-pay | Admitting: Pharmacist

## 2023-05-23 DIAGNOSIS — Z7985 Long-term (current) use of injectable non-insulin antidiabetic drugs: Secondary | ICD-10-CM | POA: Diagnosis not present

## 2023-05-23 DIAGNOSIS — E119 Type 2 diabetes mellitus without complications: Secondary | ICD-10-CM | POA: Diagnosis not present

## 2023-05-23 DIAGNOSIS — E785 Hyperlipidemia, unspecified: Secondary | ICD-10-CM

## 2023-05-23 DIAGNOSIS — E1169 Type 2 diabetes mellitus with other specified complication: Secondary | ICD-10-CM

## 2023-05-23 NOTE — Telephone Encounter (Signed)
Patient notified to check mail for application Ensure re-enrollment for Ozempic PAP 1mg   Re-enrolled in [employer] for Repatha 2025

## 2023-05-30 IMAGING — CT CT ABD-PELV W/ CM
2 of 5 series · 16 of 46 positions shown, 18 images · IV contrast (Omnipaque or Isovue)
Comparison: None.

CLINICAL DATA: Abdominal abscess/infection suspected. Constipation.
Loss of appetite.

EXAM:
CT ABDOMEN AND PELVIS WITH CONTRAST
TECHNIQUE: Multidetector CT imaging of the abdomen and pelvis was performed
using the standard protocol following bolus administration of
intravenous contrast.
CONTRAST:  100mL OMNIPAQUE IOHEXOL 300 MG/ML  SOLN

[Series 2: axial st · axial · 0.98mm/px · z∈[+1336,+1821]mm · 13 of 109 slices shown, 15 images]
[im 6/109  soft-tissue]
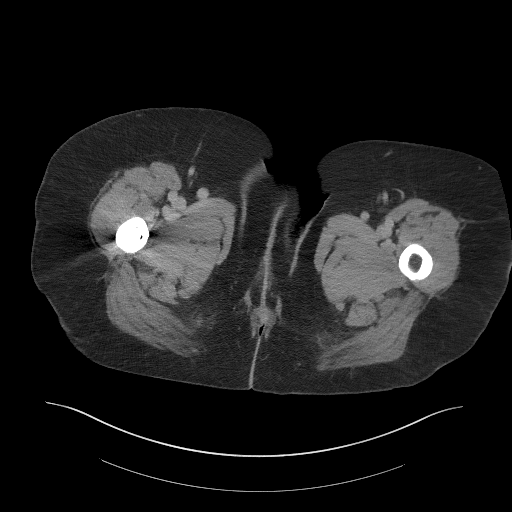
[im 6/109  bone]
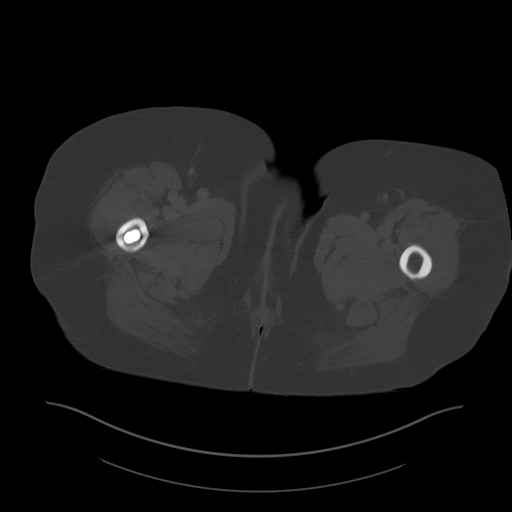
[im 18/109  soft-tissue]
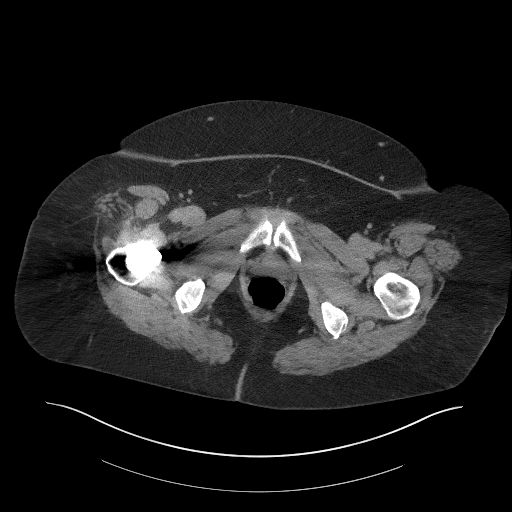
[im 23/109  soft-tissue]
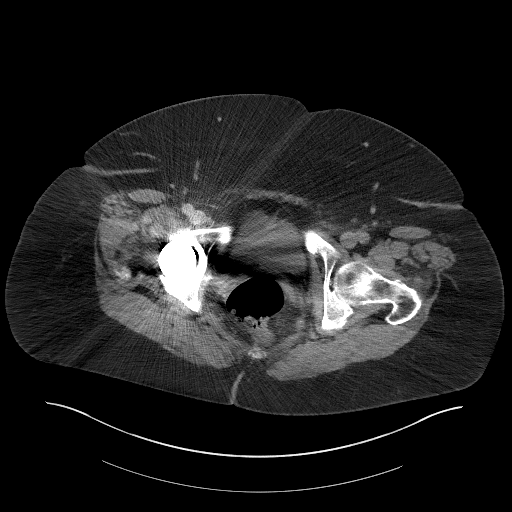
[im 29/109  soft-tissue]
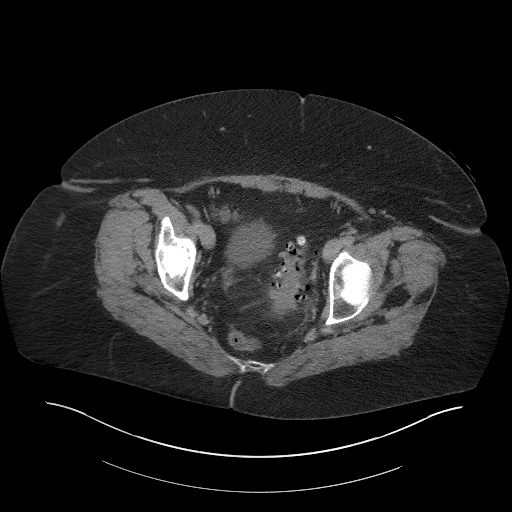
[im 40/109  soft-tissue]
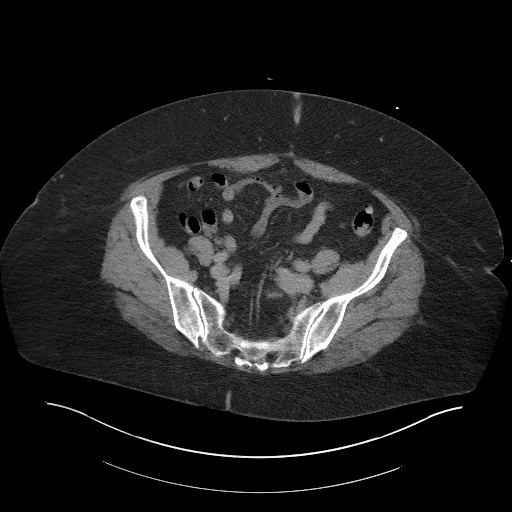
[im 46/109  soft-tissue]
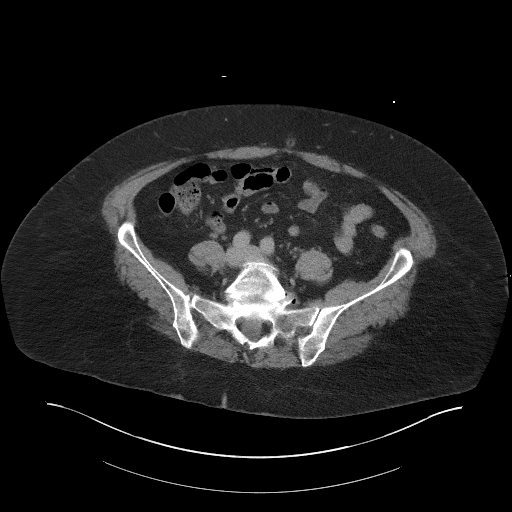
[im 57/109  soft-tissue]
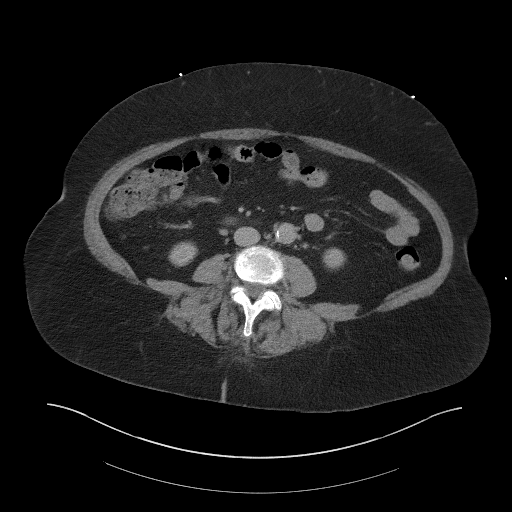
[im 63/109  soft-tissue]
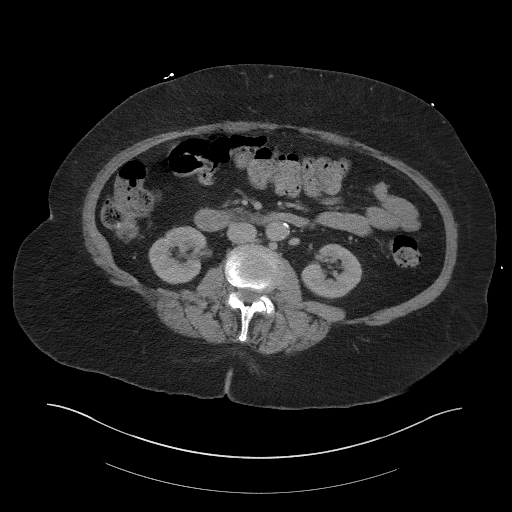
[im 69/109  soft-tissue]
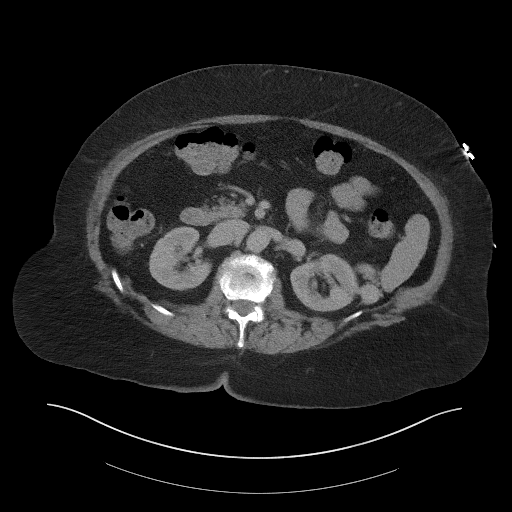
[im 69/109  bone]
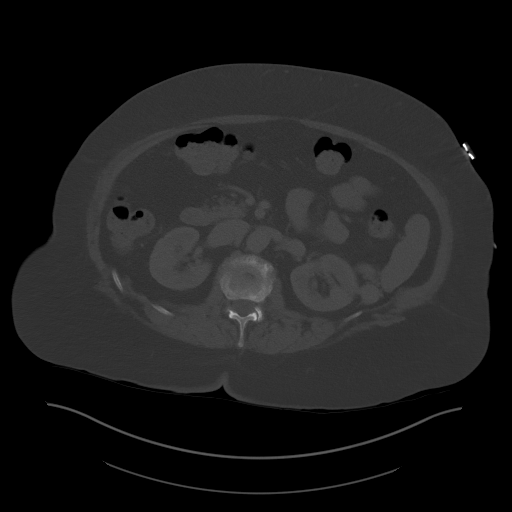
[im 80/109  soft-tissue]
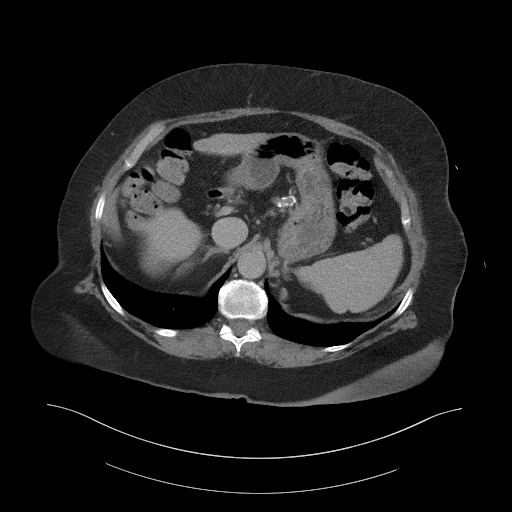
[im 86/109  soft-tissue]
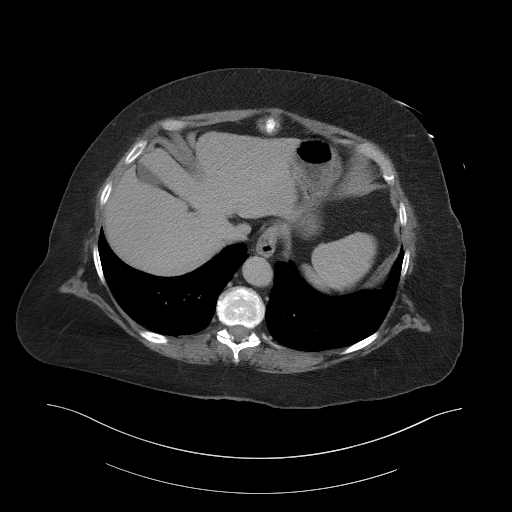
[im 91/109  soft-tissue]
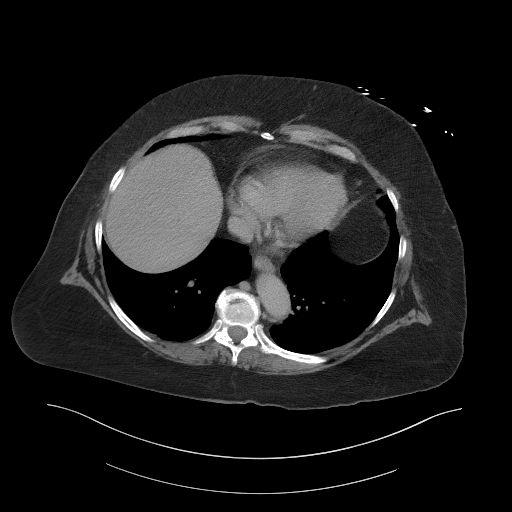
[im 103/109  soft-tissue]
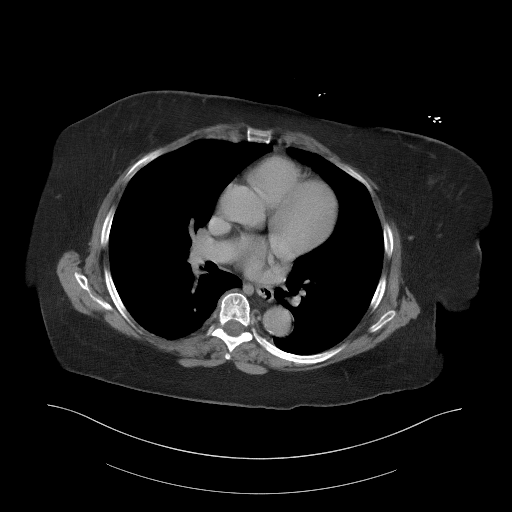

[Series 5: coronal st · coronal · 0.95mm/px · 3 of 129 slices shown]
[im 43/129  soft-tissue]
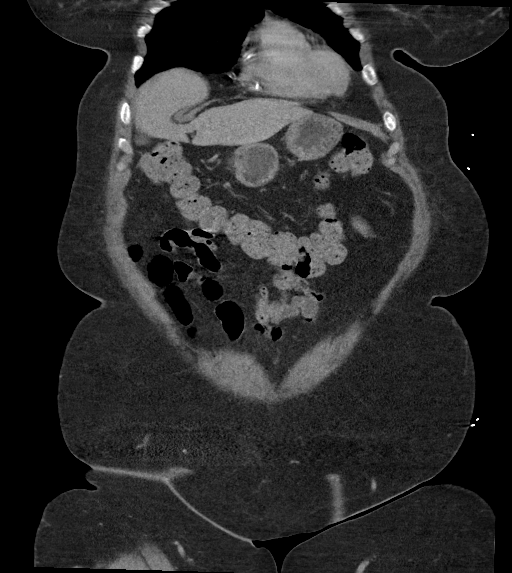
[im 57/129  soft-tissue]
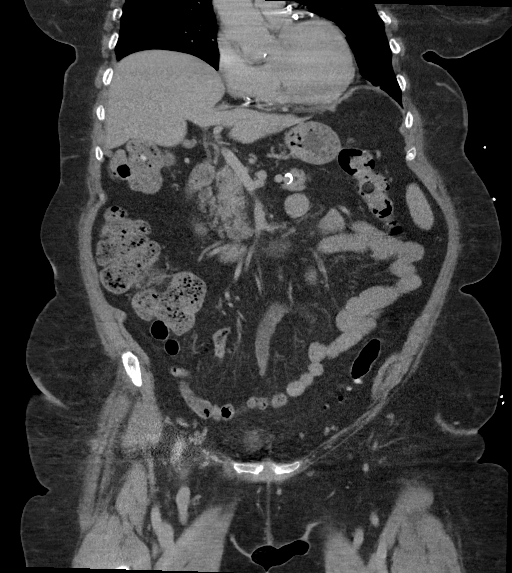
[im 72/129  soft-tissue]
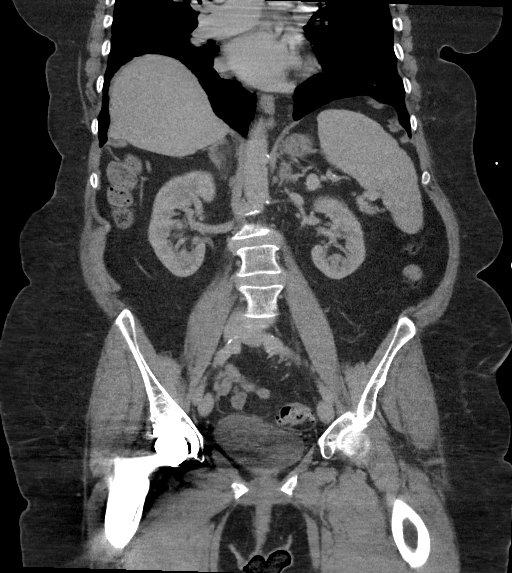

[16 of 46 positions shown; findings below may reference images not displayed]

FINDINGS: Lower chest: No acute abnormality.  Likely tiny hiatal hernia.

Hepatobiliary: No focal liver abnormality. No gallstones,
gallbladder wall thickening, or pericholecystic fluid. No biliary
dilatation.

Pancreas: No focal lesion. Normal pancreatic contour. No surrounding
inflammatory changes. No main pancreatic ductal dilatation.

Spleen: Normal in size without focal abnormality.

Adrenals/Urinary Tract:

No adrenal nodule bilaterally.

Bilateral kidneys enhance symmetrically. No hydronephrosis. No
hydroureter.

The urinary bladder is grossly unremarkable in poorly visualized due
to streak artifact originating from right femoral surgical
hardware.

Stomach/Bowel: Stomach is within normal limits. No evidence of bowel
wall thickening or dilatation. Diffuse sigmoid diverticulosis with
associated mild bowel wall thickening and pericolonic fat stranding
along the mid to distal sigmoid. No definite free pelvic gas or
intramural abscess formation. Appendix appears normal.

Vascular/Lymphatic: A left splenorenal shunt is noted. No
recanalized paraumbilical vein. No abdominal aorta or iliac
aneurysm. Mild to moderate atherosclerotic plaque of the aorta and
its branches. No abdominal, pelvic, or inguinal lymphadenopathy.

Reproductive: Limited evaluation due to streak artifact originating
from a right femoral surgical hardware. Status post hysterectomy. No
adnexal masses.

Other: No intraperitoneal free fluid. No intraperitoneal free gas.
No organized fluid collection.

Musculoskeletal:

No abdominal wall hernia or abnormality.

No suspicious lytic or blastic osseous lesions. No acute displaced
fracture. Multilevel degenerative changes of the spine. Status post
right hip total arthroplasty is partially visualized.
IMPRESSION: 1. Diffuse sigmoid diverticulosis with uncomplicated acute
diverticulitis.
2. Left splenorenal shunt of unclear etiology.
3. Tiny hiatal hernia.
4.  Aortic Atherosclerosis (10052-X6O.O).

## 2023-06-05 NOTE — Progress Notes (Deleted)
Pharmacy Medication Assistance Program Note    06/05/2023  Patient ID: Sara [name], female   DOB: [birth year], 77 y.o.   MRN: [id number]     06/05/2023  Outreach Medication One  Initial Outreach Date (Medication One) 05/23/2023  Manufacturer Medication One [employer] Drugs Ozempic  Dose of Ozempic 1MG   Type of Radiographer, therapeutic Assistance  Date Application Sent to Patient 06/05/2023  Application Items Requested Application     Mailed to pt home.

## 2023-06-14 ENCOUNTER — Ambulatory Visit (INDEPENDENT_AMBULATORY_CARE_PROVIDER_SITE_OTHER): Payer: Medicare HMO | Admitting: Pharmacist

## 2023-06-14 DIAGNOSIS — E119 Type 2 diabetes mellitus without complications: Secondary | ICD-10-CM

## 2023-06-14 DIAGNOSIS — Z7985 Long-term (current) use of injectable non-insulin antidiabetic drugs: Secondary | ICD-10-CM | POA: Diagnosis not present

## 2023-06-14 DIAGNOSIS — G720 Drug-induced myopathy: Secondary | ICD-10-CM

## 2023-06-14 NOTE — Progress Notes (Signed)
06/14/2023 Name: Sara [name] MRN: [id number] DOB: [birth year]  Chief Complaint  Patient presents with   Diabetes   Hyperlipidemia   Sara [name] is a 77 y.o. year old female who presented for a clinic visit   They were referred to the pharmacist by their PCP for assistance in managing diabetes and medication access.   Subjective:  Care Team: Primary Care Provider: Raliegh Ip, DO  Medication Access/Adherence  Current Pharmacy:  [facility] [phone] - [place] - [address] [place] [address] Phone: [phone] Fax: [phone]  [facility] [phone] - [place], [place] - [address] [place] [address] Phone: [phone] Fax: [phone]  Patient reports affordability concerns with their medications: Yes  Patient reports access/transportation concerns to their pharmacy: No  Patient reports adherence concerns with their medications:  No    Diabetes:  Current medications:  Ozempic Medications tried in the past: metformin  Current glucose readings: FBG<130 Using traditional glucometer  Patient denies hypoglycemic s/sx including dizziness, shakiness, sweating. Patient denies hyperglycemic symptoms including polyuria, polydipsia, polyphagia, nocturia, neuropathy, blurred vision.  Current meal patterns:  FOLLOWING A HEART HEALTHY DIET/HEALTHY PLATE METHOD  Current physical activity: encouraged as able  Current medication access support: Novo Nordisk PAP  Hyperlipidemia/ASCVD Risk Reduction  Current lipid lowering medications:  Repatha Medications tried in the past: statins (myalgias G72)  Antiplatelet regimen:  ASA 81mg   ASCVD History: h/o MI  Current physical activity: encouraged as able; having sciatica   Current medication access support: healthwell grant EXP 04/2024  Clinical ASCVD: Yes  The ASCVD Risk score (Arnett DK, et al., 2019) failed to calculate for the following  reasons:   The patient has a prior MI or stroke diagnosis   Objective:  Lab Results  Component Value Date   HGBA1C 5.3 05/20/2023    Lab Results  Component Value Date   CREATININE 0.73 05/20/2023   BUN 12 05/20/2023   NA 139 05/20/2023   K 4.1 05/20/2023   CL 104 05/20/2023   CO2 21 05/20/2023    Lab Results  Component Value Date   CHOL 143 05/20/2023   HDL 54 05/20/2023   LDLCALC 72 05/20/2023   TRIG 88 05/20/2023   CHOLHDL 2.6 05/20/2023    Medications Reviewed Today     Reviewed by Danella Maiers, [facility] (Pharmacist) on 06/14/23 at 0904  Med List Status: <None>   Medication Order Taking? Sig Documenting Provider Last Dose Status Informant  albuterol (VENTOLIN HFA) 108 (90 Base) MCG/ACT inhaler [id number] No TAKE 2 PUFFS BY MOUTH EVERY 6 HOURS AS NEEDED FOR WHEEZE OR SHORTNESS OF BREATH  Patient taking differently: Inhale 2 puffs into the lungs every 6 (six) hours as needed for wheezing or shortness of breath.   Delynn Flavin M, DO Taking Active   alendronate (FOSAMAX) 70 MG tablet [id number]  TAKE 1 TABLET EVERY 7 DAYS WITH A FULL GLASS OF WATER ON AN EMPTY STOMACH Delynn Flavin M, DO  Active   aspirin EC 81 MG tablet [id number] No Take 81 mg by mouth daily. [provider] Taking Active Self  betamethasone valerate ointment (VALISONE) 0.1 % [id number] No Apply 1 application topically 2 (two) times daily as needed (psoriasis). (DO NOT apply to face, groin or axilla) Raliegh Ip, DO Taking Active   budesonide-formoterol (SYMBICORT) 80-4.5 MCG/ACT inhaler [id number] No Inhale 2 puffs into the lungs 2 (two) times daily. Raliegh Ip, DO Taking Active  Self           Med Note Cresenciano Genre, Lilla Shook   Fri Jun 14, 2023  9:04 AM)    Calcium Carbonate-Vitamin D (SUPER CALCIUM 600 + D3 PO) [id number] No Take 1 tablet by mouth daily. [provider] Taking Active Self  cyclobenzaprine (FLEXERIL) 5 MG tablet [id number] No TAKE 1 TABLET BY MOUTH THREE TIMES  A DAY AS NEEDED FOR MUSCLE SPASMS Hawks, Christy A, FNP Taking Active   diltiazem (CARDIZEM CD) 120 MG 24 hr capsule [id number]  TAKE 1 CAPSULE BY MOUTH EVERY DAY Delynn Flavin M, DO  Active   Evolocumab (REPATHA SURECLICK) 140 MG/ML SOAJ [id number]  INJECT 140 MG INTO THE SKIN EVERY 14 (FOURTEEN) DAYS. CANCEL [address] Delynn Flavin M, DO  Active            Med Note Cresenciano Genre, Lilla Shook   Fri Jun 14, 2023  9:04 AM) Healthwell grant  furosemide (LASIX) 20 MG tablet [id number]  Take 1 tablet (20 mg total) by mouth daily. Delynn Flavin M, DO  Active   losartan (COZAAR) 25 MG tablet [id number]  Take 1 tablet (25 mg total) by mouth daily. Delynn Flavin M, DO  Active   meclizine (ANTIVERT) 25 MG tablet [id number] No Take 1 tablet (25 mg total) by mouth 2 (two) times daily as needed for dizziness. Reported on 12/05/2015 Junie Spencer, FNP Taking Active Self  mometasone (NASONEX) 50 MCG/ACT nasal spray [id number] No Place 2 sprays into the nose daily. Delynn Flavin M, DO Taking Active   nitroGLYCERIN (NITROSTAT) 0.4 MG SL tablet [id number] No Place 1 tablet (0.4 mg total) under the tongue every 5 (five) minutes as needed for chest pain (x 3 doses). Reported on 12/05/2015 Raliegh Ip, DO Taking Active   omeprazole (PRILOSEC) 20 MG capsule [id number]  Take 1 capsule (20 mg total) by mouth daily. Delynn Flavin M, DO  Active   potassium chloride (KLOR-CON) 10 MEQ tablet [id number]  Take 1 tablet (10 mEq total) by mouth daily. Raliegh Ip, DO  Active   Semaglutide, 1 MG/DOSE, 4 MG/3ML SOPN [id number] No Inject 1 mg as directed once a week. Delynn Flavin M, DO Taking Active   spironolactone (ALDACTONE) 25 MG tablet [id number]  Take 0.5 tablets (12.5 mg total) by mouth 2 (two) times daily. Delynn Flavin M, DO  Active            Assessment/Plan:   Diabetes: - Currently controlled--A1c 5.3% - Reviewed long term cardiovascular and renal outcomes of uncontrolled blood  sugar - Reviewed goal A1c, goal fasting, and goal 2 hour post prandial glucose - Reviewed dietary modifications including FOLLOWING A HEART HEALTHY DIET/HEALTHY PLATE METHOD - Recommend to continue Ozempic 1mg  weekly for now Consider increase Ozempic 2mg  if desired and tolerated patient denies personal or family history of medullary thyroid carcinoma (MTC) or in patients with Multiple Endocrine Neoplasia syndrome type 2 (MEN 2) - Recommend to check glucose daily (fasting) or if symptomatic -ordered UACR - Meets financial criteria for Ozempic patient assistance program through [employer] PAP. Will collaborate with provider, CPhT, and patient to pursue assistance.  Patient presented to clinic with forms form [employer]ord[employer] PAP  Hyperlipidemia/ASCVD Risk Reduction: - Currently controlled. 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 Will collaborate with provider, CPhT, and patient to pursue assistance.   Follow Up Plan: 6 months    Kieth Brightly, PharmD, BCACP, CPP Clinical Pharmacist, [facility]

## 2023-06-25 ENCOUNTER — Ambulatory Visit
Admission: RE | Admit: 2023-06-25 | Discharge: 2023-06-25 | Disposition: A | Discharge status: Home / Self Care | Payer: Medicare HMO | Source: Ambulatory Visit | Attending: Family Medicine

## 2023-06-25 DIAGNOSIS — Z1231 Encounter for screening mammogram for malignant neoplasm of breast: Secondary | ICD-10-CM

## 2023-06-27 DIAGNOSIS — H04123 Dry eye syndrome of bilateral lacrimal glands: Secondary | ICD-10-CM | POA: Diagnosis not present

## 2023-06-27 DIAGNOSIS — H52203 Unspecified astigmatism, bilateral: Secondary | ICD-10-CM | POA: Diagnosis not present

## 2023-06-27 DIAGNOSIS — E119 Type 2 diabetes mellitus without complications: Secondary | ICD-10-CM | POA: Diagnosis not present

## 2023-06-27 DIAGNOSIS — Z961 Presence of intraocular lens: Secondary | ICD-10-CM | POA: Diagnosis not present

## 2023-06-27 LAB — HM DIABETES EYE EXAM

## 2023-06-28 NOTE — Progress Notes (Addendum)
Pharmacy Medication Assistance Program Note    07/23/2023  Patient ID: Sara [name], female   DOB: 07-18-1946, 77 y.o.   MRN: [id number]     06/05/2023  Outreach Medication One  Initial Outreach Date (Medication One) 05/23/2023  Manufacturer Medication One [employer] Drugs Ozempic  Dose of Ozempic 1MG   Type of Radiographer, therapeutic Assistance  Date Application Sent to Patient 06/05/2023  Application Items Requested Application  Date Application Sent to Prescriber 06/28/2023  Name of Prescriber Delynn Flavin  Date Application Received From Patient 06/28/2023  Application Items Received From Patient Application  Date Application Submitted to Manufacturer 07/03/2023  Method Application Sent to Manufacturer Fax  Patient Assistance Determination Approved  Approval End Date 07/22/2024

## 2023-07-16 ENCOUNTER — Other Ambulatory Visit: Payer: Self-pay | Admitting: Family Medicine

## 2023-07-16 DIAGNOSIS — J302 Other seasonal allergic rhinitis: Secondary | ICD-10-CM

## 2023-07-16 DIAGNOSIS — J019 Acute sinusitis, unspecified: Secondary | ICD-10-CM

## 2023-07-21 ENCOUNTER — Other Ambulatory Visit: Payer: Self-pay | Admitting: Family Medicine

## 2023-07-21 DIAGNOSIS — I152 Hypertension secondary to endocrine disorders: Secondary | ICD-10-CM

## 2023-08-15 ENCOUNTER — Telehealth: Payer: Self-pay | Admitting: Pharmacist

## 2023-08-15 NOTE — Telephone Encounter (Signed)
Notified patient that Ozempic 1mg  shipment is available for pick up #4 boxes of Ozempic 1mg  Patient stable on current dose

## 2023-09-17 ENCOUNTER — Ambulatory Visit (INDEPENDENT_AMBULATORY_CARE_PROVIDER_SITE_OTHER): Payer: Medicare HMO

## 2023-09-17 VITALS — Ht 64.0 in | Wt 224.0 lb

## 2023-09-17 DIAGNOSIS — Z0000 Encounter for general adult medical examination without abnormal findings: Secondary | ICD-10-CM | POA: Diagnosis not present

## 2023-09-17 NOTE — Patient Instructions (Signed)
 Ms. Wardle , Thank you for taking time to come for your Medicare Wellness Visit. I appreciate your ongoing commitment to your health goals. Please review the following plan we discussed and let me know if I can assist you in the future.   Referrals/Orders/Follow-Ups/Clinician Recommendations: Aim for 30 minutes of exercise or brisk walking, 6-8 glasses of water, and 5 servings of fruits and vegetables each day.  This is a list of the screening recommended for you and due dates:  Health Maintenance  Topic Date Due   COVID-19 Vaccine (5 - 2024-25 season) 03/24/2023   Eye exam for diabetics  06/19/2023   Yearly kidney health urinalysis for diabetes  06/26/2023   Hemoglobin A1C  11/18/2023   Yearly kidney function blood test for diabetes  05/19/2024   Complete foot exam   05/19/2024   DEXA scan (bone density measurement)  06/26/2024   Medicare Annual Wellness Visit  09/16/2024   DTaP/Tdap/Td vaccine (3 - Td or Tdap) 01/12/2031   Pneumonia Vaccine  Completed   Flu Shot  Completed   Hepatitis C Screening  Completed   Zoster (Shingles) Vaccine  Completed   HPV Vaccine  Aged Out   Colon Cancer Screening  Discontinued    Advanced directives: (ACP Link)Information on Advanced Care Planning can be found at [facility] [place] Advance Health Care Directives Advance Health Care Directives ([email or web address]   Next Medicare Annual Wellness Visit scheduled for next year: Yes

## 2023-09-17 NOTE — Progress Notes (Signed)
 Subjective:   Sara [name] is a 78 y.o. who presents for a Medicare Wellness preventive visit.  Visit Complete: Virtual I connected with  Sara [name] on 09/17/23 by a audio enabled telemedicine application and verified that I am speaking with the correct person using two identifiers.  Patient Location: Home  Provider Location: Home Office  I discussed the limitations of evaluation and management by telemedicine. The patient expressed understanding and agreed to proceed.  Vital Signs: Because this visit was a virtual/telehealth visit, some criteria may be missing or patient reported. Any vitals not documented were not able to be obtained and vitals that have been documented are patient reported.  VideoDeclined- This patient declined Librarian, academic. Therefore the visit was completed with audio only.  AWV Questionnaire: No: Patient Medicare AWV questionnaire was not completed prior to this visit.  Cardiac Risk Factors include: advanced age (>54men, >9 women);diabetes mellitus;hypertension;dyslipidemia     Objective:    Today's Vitals   09/17/23 1614  Weight: 224 lb (101.6 kg)  Height: 5\' 4"  (1.626 m)   Body mass index is 38.45 kg/m.     09/17/2023    4:21 PM 09/04/2022    9:57 AM 10/25/2021   12:57 PM 07/05/2021   10:09 AM 05/29/2021   12:47 PM 01/20/2021    5:01 PM 05/21/2017    9:03 AM  Advanced Directives  Does Patient Have a Medical Advance Directive? No No No No No No No  Would patient like information on creating a medical advance directive? Yes (MAU/Ambulatory/Procedural Areas - Information given) No - Patient declined  No - Patient declined  No - Patient declined No - Patient declined    Current Medications (verified) Outpatient Encounter Medications as of 09/17/2023  Medication Sig   albuterol (VENTOLIN HFA) 108 (90 Base) MCG/ACT inhaler TAKE 2 PUFFS BY MOUTH EVERY 6 HOURS AS NEEDED FOR WHEEZE OR SHORTNESS OF BREATH  (Patient taking differently: Inhale 2 puffs into the lungs every 6 (six) hours as needed for wheezing or shortness of breath.)   alendronate (FOSAMAX) 70 MG tablet TAKE 1 TABLET EVERY 7 DAYS WITH A FULL GLASS OF WATER ON AN EMPTY STOMACH   aspirin EC 81 MG tablet Take 81 mg by mouth daily.   betamethasone valerate ointment (VALISONE) 0.1 % Apply 1 application topically 2 (two) times daily as needed (psoriasis). (DO NOT apply to face, groin or axilla)   budesonide-formoterol (SYMBICORT) 80-4.5 MCG/ACT inhaler Inhale 2 puffs into the lungs 2 (two) times daily.   Calcium Carbonate-Vitamin D (SUPER CALCIUM 600 + D3 PO) Take 1 tablet by mouth daily.   cyclobenzaprine (FLEXERIL) 5 MG tablet TAKE 1 TABLET BY MOUTH THREE TIMES A DAY AS NEEDED FOR MUSCLE SPASMS   diltiazem (CARDIZEM CD) 120 MG 24 hr capsule TAKE 1 CAPSULE BY MOUTH EVERY DAY   Evolocumab (REPATHA SURECLICK) 140 MG/ML SOAJ INJECT 140 MG INTO THE SKIN EVERY 14 (FOURTEEN) DAYS. CANCEL PRALEUNT RX   furosemide (LASIX) 20 MG tablet Take 1 tablet (20 mg total) by mouth daily.   losartan (COZAAR) 25 MG tablet TAKE 1 TABLET BY MOUTH TWICE A DAY   meclizine (ANTIVERT) 25 MG tablet Take 1 tablet (25 mg total) by mouth 2 (two) times daily as needed for dizziness. Reported on 12/05/2015   mometasone (NASONEX) 50 MCG/ACT nasal spray PLACE 2 SPRAYS INTO THE NOSE DAILY.   nitroGLYCERIN (NITROSTAT) 0.4 MG SL tablet Place 1 tablet (0.4 mg total) under the tongue every 5 (  five) minutes as needed for chest pain (x 3 doses). Reported on 12/05/2015   omeprazole (PRILOSEC) 20 MG capsule Take 1 capsule (20 mg total) by mouth daily.   potassium chloride (KLOR-CON) 10 MEQ tablet Take 1 tablet (10 mEq total) by mouth daily.   Semaglutide, 1 MG/DOSE, 4 MG/3ML SOPN Inject 1 mg as directed once a week.   spironolactone (ALDACTONE) 25 MG tablet Take 0.5 tablets (12.5 mg total) by mouth 2 (two) times daily.   No facility-administered encounter medications on file as of  09/17/2023.    Allergies (verified) Latex, Nifedipine, Tape, Codeine, and Crestor [rosuvastatin]   History: Past Medical History:  Diagnosis Date   Arthritis    Arthritis -hands -Bil. 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 s/p Xience DES x 3 to LAD;  b. nuc study 02/27/10: EF 67% no ischemia;  c.  echo 11/12: Mild LVH, EF 55-65%, grade 1 diastolic dysfunction, moderate LAE;  d.  LHC 07/03/11: LAD stents patent, RCA 25%, EF 55-65% ;  e. Adeno. Myoview 5/14:  No ischemia, EF 68%   COVID-19 2021   Depression    Diverticular disease    GERD (gastroesophageal reflux disease)    Hemorrhoid    Hepatitis    yellow jaundice as a child    HTN (hypertension)    Hyperlipidemia    Hypertension    Impaired hearing    bilateral- no hearing aids   Myocardial infarction (HCC)    hx of x 2 3-4 years ago per pt   Obesity    OSA (obstructive sleep apnea)    needs no cpap per patient pt denies   Other psoriasis    ongoing- none at present.   Vertigo    Past Surgical History:  Procedure Laterality Date   ABDOMINAL HYSTERECTOMY     BACK SURGERY     Dr. Magdalene Patricia   CARDIAC CATHETERIZATION  08/23/2011   Ablation AV  Node   CATARACT EXTRACTION Bilateral    COLONOSCOPY     CORONARY ANGIOPLASTY     with stents 2009    KNEE SURGERY     right   LEFT AND RIGHT HEART CATHETERIZATION WITH CORONARY ANGIOGRAM N/A 08/18/2013   Procedure: LEFT AND RIGHT HEART CATHETERIZATION WITH CORONARY ANGIOGRAM;  Surgeon: Laurey Morale, MD;  Location[facility] CATH LAB;  Service: Cardiovascular;  Laterality: N/A;   LEFT HEART CATHETERIZATION WITH CORONARY ANGIOGRAM N/A 07/03/2011   Procedure: LEFT HEART CATHETERIZATION WITH CORONARY ANGIOGRAM;  Surgeon: Rollene Rotunda, MD;  Location: [facility] CATH LAB;  Service: Cardiovascular;  Laterality: N/A;   REPAIR RECTOCELE     SUPRAVENTRICULAR TACHYCARDIA ABLATION N/A  08/23/2011   Procedure: SUPRAVENTRICULAR TACHYCARDIA ABLATION;  Surgeon: Marinus Maw, MD;  Location: [facility] CATH LAB;  Service: Cardiovascular;  Laterality: N/A;   TOTAL HIP ARTHROPLASTY Right 02/23/2016   Procedure: RIGHT TOTAL HIP ARTHROPLASTY ANTERIOR APPROACH;  Surgeon: Samson Frederic, MD;  Location: [facility] ORS;  Service: Orthopedics;  Laterality: Right;   TOTAL KNEE ARTHROPLASTY Left 12/22/2012   Procedure: LEFT TOTAL KNEE ARTHROPLASTY;  Surgeon: Loanne Drilling, MD;  Location: [facility] ORS;  Service: Orthopedics;  Laterality: Left;   TUBAL LIGATION     UPPER GASTROINTESTINAL ENDOSCOPY     Family History  Problem Relation Age of Onset   Stroke Mother    Hypertension Mother  Heart failure Mother    Breast cancer Mother    Aneurysm Father    Seizures Brother    Muscular dystrophy Daughter        dx as infant, passed age 90   Seizures Son    Colon cancer Neg Hx    Esophageal cancer Neg Hx    Rectal cancer Neg Hx    Stomach cancer Neg Hx    Social History   Socioeconomic History   Marital status: Married    Spouse name: [name]   Number of children: 3   Years of education: Not on file   Highest education level: Not on file  Occupational History   Occupation: hairdresser  Tobacco Use   Smoking status: Never   Smokeless tobacco: Never  Vaping Use   Vaping status: Never Used  Substance and Sexual Activity   Alcohol use: No   Drug use: No   Sexual activity: Not Currently    Birth control/protection: Post-menopausal  Other Topics Concern   Not on file  Social History Narrative   Lives with husband, [name].    1 son died in 10/15/17 from ALS.    Social Drivers of Corporate investment banker Strain: Low Risk  (09/17/2023)   Overall Financial Resource Strain (CARDIA)    Difficulty of Paying Living Expenses: Not hard at all  Food Insecurity: No Food Insecurity (09/17/2023)   Hunger Vital Sign    Worried About Running Out of Food in the Last Year: Never true    Ran Out of Food in the  Last Year: Never true  Transportation Needs: No Transportation Needs (09/17/2023)   PRAPARE - Administrator, Civil Service (Medical): No    Lack of Transportation (Non-Medical): No  Physical Activity: Insufficiently Active (09/17/2023)   Exercise Vital Sign    Days of Exercise per Week: 3 days    Minutes of Exercise per Session: 30 min  Stress: No Stress Concern Present (09/17/2023)   [employer] of Occupational Health - Occupational Stress Questionnaire    Feeling of Stress : Not at all  Social Connections: Socially Integrated (09/17/2023)   Social Connection and Isolation Panel [NHANES]    Frequency of Communication with Friends and Family: More than three times a week    Frequency of Social Gatherings with Friends and Family: Three times a week    Attends Religious Services: More than 4 times per year    Active Member of Clubs or Organizations: Yes    Attends Engineer, structural: More than 4 times per year    Marital Status: Married    Tobacco Counseling Counseling given: Not Answered    Clinical Intake:  Pre-visit preparation completed: Yes  Pain : No/denies pain     Diabetes: Yes CBG done?: No Did pt. bring in CBG monitor from home?: No  How often do you need to have someone help you when you read instructions, pamphlets, or other written materials from your doctor or pharmacy?: 1 - Never  Interpreter Needed?: No  Information entered by :: Kandis Fantasia LPN   Activities of Daily Living     09/17/2023    4:20 PM  In your present state of health, do you have any difficulty performing the following activities:  Hearing? 0  Vision? 0  Difficulty concentrating or making decisions? 0  Walking or climbing stairs? 0  Dressing or bathing? 0  Doing errands, shopping? 0  Preparing Food and eating ? N  Using the  Toilet? N  In the past six months, have you accidently leaked urine? N  Do you have problems with loss of bowel control? N   Managing your Medications? N  Managing your Finances? N  Housekeeping or managing your Housekeeping? N    Patient Care Team: Raliegh Ip, DO as PCP - General (Family Medicine) Bensimhon, Bevelyn Buckles, MD (Cardiology) Ollen Gross, MD as Consulting Physician (Orthopedic Surgery) Danella Maiers, [facility] (Pharmacist) Antony Contras, MD as Consulting Physician (Ophthalmology)  Indicate any recent Medical Services you may have received from other than [facility] providers in the past year (date may be approximate).     Assessment:   This is a routine wellness examination for Sara [name].  Hearing/Vision screen Hearing Screening - Comments:: Denies hearing difficulties   Vision Screening - Comments:: Wears rx glasses - up to date with routine eye exams with Dr. Randon Goldsmith    Goals Addressed   None    Depression Screen     09/17/2023    4:18 PM 05/20/2023   11:31 AM 09/04/2022    9:56 AM 06/25/2022    8:07 AM 01/02/2022    8:37 AM 09/18/2021    3:06 PM 07/05/2021   10:03 AM  PHQ 2/9 Scores  PHQ - 2 Score 0 0 0 0 1 1 0  PHQ- 9 Score 2 2   5 7      Fall Risk     09/17/2023    4:20 PM 05/20/2023   11:31 AM 09/04/2022    9:55 AM 06/25/2022    8:07 AM 01/02/2022    8:37 AM  Fall Risk   Falls in the past year? 0 0 0 0 0  Number falls in past yr: 0 0 0    Injury with Fall? 0 0 0    Risk for fall due to : No Fall Risks No Fall Risks No Fall Risks    Follow up Falls prevention discussed;Education provided;Falls evaluation completed Education provided Falls prevention discussed  Falls evaluation completed    MEDICARE RISK AT HOME:  Medicare Risk at Home Any stairs in or around the home?: No If so, are there any without handrails?: No Home free of loose throw rugs in walkways, pet beds, electrical cords, etc?: Yes Adequate lighting in your home to reduce risk of falls?: Yes Life alert?: No Use of a cane, walker or w/c?: No Grab bars in the bathroom?: Yes Shower chair or bench in shower?:  No Elevated toilet seat or a handicapped toilet?: Yes  TIMED UP AND GO:  Was the test performed?  No  Cognitive Function: 6CIT completed    05/21/2017    9:22 AM  MMSE - Mini Mental State Exam  Orientation to time 5  Orientation to Place 5  Registration 3  Attention/ Calculation 5  Recall 3  Language- name 2 objects 2  Language- repeat 1  Language- follow 3 step command 3  Language- read & follow direction 1  Write a sentence 1  Copy design 1  Total score 30        09/17/2023    4:21 PM 09/04/2022    9:57 AM 07/05/2021   10:17 AM  6CIT Screen  What Year? 0 points 0 points 0 points  What month? 0 points 0 points 0 points  What time? 0 points 0 points 0 points  Count back from 20 0 points 0 points 0 points  Months in reverse 0 points 0 points 0 points  Repeat phrase 0  points 0 points 0 points  Total Score 0 points 0 points 0 points    Immunizations Immunization History  Administered Date(s) Administered   Fluad Quad(high Dose 65+) 05/04/2019, 05/25/2020, 06/13/2021, 07/04/2022   Fluad Trivalent(High Dose 65+) 04/02/2023   Influenza Whole 04/22/2009   Influenza, High Dose Seasonal PF 05/01/2018   Influenza,inj,Quad PF,6+ Mos 04/25/2015   Influenza-Unspecified 05/18/2014, 07/05/2016, 05/13/2017   Moderna Sars-Covid-2 Vaccination 10/12/2019, 11/09/2019, 06/01/2020, 01/04/2021   Pneumococcal Conjugate-13 06/15/2014   Pneumococcal Polysaccharide-23 04/22/2008, 05/04/2019   Tdap 04/22/2013, 01/11/2021   Zoster Recombinant(Shingrix) 02/24/2021, 04/26/2021   Zoster, Live 08/03/2010    Screening Tests Health Maintenance  Topic Date Due   COVID-19 Vaccine (5 - 2024-25 season) 03/24/2023   OPHTHALMOLOGY EXAM  06/19/2023   Diabetic kidney evaluation - Urine ACR  06/26/2023   HEMOGLOBIN A1C  11/18/2023   Diabetic kidney evaluation - eGFR measurement  05/19/2024   FOOT EXAM  05/19/2024   DEXA SCAN  06/26/2024   Medicare Annual Wellness (AWV)  09/16/2024    DTaP/Tdap/Td (3 - Td or Tdap) 01/12/2031   Pneumonia Vaccine 19+ Years old  Completed   INFLUENZA VACCINE  Completed   Hepatitis C Screening  Completed   Zoster Vaccines- Shingrix  Completed   HPV VACCINES  Aged Out   Colonoscopy  Discontinued    Health Maintenance  Health Maintenance Due  Topic Date Due   COVID-19 Vaccine (5 - 2024-25 season) 03/24/2023   OPHTHALMOLOGY EXAM  06/19/2023   Diabetic kidney evaluation - Urine ACR  06/26/2023   Health Maintenance Items Addressed: Records requested for diabetic eye exam   Additional Screening:  Vision Screening: Recommended annual ophthalmology exams for early detection of glaucoma and other disorders of the eye.  Dental Screening: Recommended annual dental exams for proper oral hygiene  Community Resource Referral / Chronic Care Management: CRR required this visit?  No   CCM required this visit?  No     Plan:     I have personally reviewed and noted the following in the patient's chart:   Medical and social history Use of alcohol, tobacco or illicit drugs  Current medications and supplements including opioid prescriptions. Patient is not currently taking opioid prescriptions. Functional ability and status Nutritional status Physical activity Advanced directives List of other physicians Hospitalizations, surgeries, and ER visits in previous 12 months Vitals Screenings to include cognitive, depression, and falls Referrals and appointments  In addition, I have reviewed and discussed with patient certain preventive protocols, quality metrics, and best practice recommendations. A written personalized care plan for preventive services as well as general preventive health recommendations were provided to patient.     Kandis Fantasia [place], [place]   1/61/0960   After Visit Summary: (Mail) Due to this being a telephonic visit, the after visit summary with patients personalized plan was offered to patient via mail    Notes: Nothing significant to report at this time.

## 2023-09-24 ENCOUNTER — Telehealth: Payer: Self-pay | Admitting: Pharmacist

## 2023-09-24 DIAGNOSIS — Z7985 Long-term (current) use of injectable non-insulin antidiabetic drugs: Secondary | ICD-10-CM

## 2023-09-24 MED ORDER — ONDANSETRON 4 MG PO TBDP: 4.0000 mg | ORAL_TABLET | Freq: Three times a day (TID) | ORAL | 1 refills | Status: DC | PRN

## 2023-09-24 NOTE — Telephone Encounter (Signed)
 Patient needs to decrease to Ozempic 0.5mg  weekly--can you please route me dose change form 1mg  is too much for GI-increased nausea and vomiting  Sending in zofran PRN

## 2023-10-10 ENCOUNTER — Other Ambulatory Visit (INDEPENDENT_AMBULATORY_CARE_PROVIDER_SITE_OTHER)

## 2023-10-10 ENCOUNTER — Ambulatory Visit: Admitting: Internal Medicine

## 2023-10-10 ENCOUNTER — Encounter: Payer: Self-pay | Admitting: Internal Medicine

## 2023-10-10 VITALS — BP 112/68 | HR 66 | Ht 64.0 in | Wt 217.0 lb

## 2023-10-10 DIAGNOSIS — R1031 Right lower quadrant pain: Secondary | ICD-10-CM | POA: Diagnosis not present

## 2023-10-10 DIAGNOSIS — R112 Nausea with vomiting, unspecified: Secondary | ICD-10-CM

## 2023-10-10 DIAGNOSIS — K5641 Fecal impaction: Secondary | ICD-10-CM

## 2023-10-10 DIAGNOSIS — R1013 Epigastric pain: Secondary | ICD-10-CM

## 2023-10-10 DIAGNOSIS — K641 Second degree hemorrhoids: Secondary | ICD-10-CM | POA: Diagnosis not present

## 2023-10-10 DIAGNOSIS — K625 Hemorrhage of anus and rectum: Secondary | ICD-10-CM | POA: Diagnosis not present

## 2023-10-10 LAB — CBC WITH DIFFERENTIAL/PLATELET
Basophils Absolute: 0 10*3/uL (ref 0.0–0.1)
Basophils Relative: 0.6 % (ref 0.0–3.0)
Eosinophils Absolute: 0.4 10*3/uL (ref 0.0–0.7)
Eosinophils Relative: 5.3 % — ABNORMAL HIGH (ref 0.0–5.0)
HCT: 42.1 % (ref 36.0–46.0)
Hemoglobin: 14.5 g/dL (ref 12.0–15.0)
Lymphocytes Relative: 25 % (ref 12.0–46.0)
Lymphs Abs: 1.7 10*3/uL (ref 0.7–4.0)
MCHC: 34.6 g/dL (ref 30.0–36.0)
MCV: 95.4 fl (ref 78.0–100.0)
Monocytes Absolute: 0.6 10*3/uL (ref 0.1–1.0)
Monocytes Relative: 9.3 % (ref 3.0–12.0)
Neutro Abs: 4.1 10*3/uL (ref 1.4–7.7)
Neutrophils Relative %: 59.8 % (ref 43.0–77.0)
Platelets: 185 10*3/uL (ref 150.0–400.0)
RBC: 4.41 Mil/uL (ref 3.87–5.11)
RDW: 13.6 % (ref 11.5–15.5)
WBC: 6.9 10*3/uL (ref 4.0–10.5)

## 2023-10-10 LAB — COMPREHENSIVE METABOLIC PANEL
ALT: 12 U/L (ref 0–35)
AST: 20 U/L (ref 0–37)
Albumin: 3.8 g/dL (ref 3.5–5.2)
Alkaline Phosphatase: 53 U/L (ref 39–117)
BUN: 13 mg/dL (ref 6–23)
CO2: 27 meq/L (ref 19–32)
Calcium: 9.5 mg/dL (ref 8.4–10.5)
Chloride: 103 meq/L (ref 96–112)
Creatinine, Ser: 0.76 mg/dL (ref 0.40–1.20)
GFR: 75.08 mL/min (ref >60.00)
Glucose, Bld: 110 mg/dL — ABNORMAL HIGH (ref 70–99)
Potassium: 4.1 meq/L (ref 3.5–5.1)
Sodium: 136 meq/L (ref 135–145)
Total Bilirubin: 1.7 mg/dL — ABNORMAL HIGH (ref 0.2–1.2)
Total Protein: 6.9 g/dL (ref 6.0–8.3)

## 2023-10-10 LAB — LIPASE: Lipase: 27 U/L (ref 11.0–59.0)

## 2023-10-10 NOTE — Patient Instructions (Addendum)
 Your provider has requested that you go to the basement level for lab work before leaving today. Press "B" on the elevator. The lab is located at the first door on the left as you exit the elevator.  Due to recent changes in healthcare laws, you may see the results of your imaging and laboratory studies on MyChart before your provider has had a chance to review them.  We understand that in some cases there may be results that are confusing or concerning to you. Not all laboratory results come back in the same time frame and the provider may be waiting for multiple results in order to interpret others.  Please give [place] 48 hours in order for your provider to thoroughly review all the results before contacting the office for clarification of your results.   You have been scheduled for an endoscopy. Please follow written instructions given to you at your visit today.  If you use inhalers (even only as needed), please bring them with you on the day of your procedure.  If you take any of the following medications, they will need to be adjusted prior to your procedure:   DO NOT TAKE 7 DAYS PRIOR TO TEST- Trulicity (dulaglutide) Ozempic, Wegovy (semaglutide) Mounjaro (tirzepatide) Bydureon Bcise (exanatide extended release)  DO NOT TAKE 1 DAY PRIOR TO YOUR TEST Rybelsus (semaglutide) Adlyxin (lixisenatide) Victoza (liraglutide) Byetta (exanatide) ___________________________________________________________________________    Follow a soft bland diet.  Dr Leone Payor recommends that you complete a bowel purge (to clean out your bowels). Please do the following: Purchase a bottle of Miralax over the counter as well as a box of 5 mg dulcolax tablets. Take 4 dulcolax tablets. Wait 1 hour. You will then drink 6-8 capfuls of Miralax mixed in an adequate amount of water/juice/gatorade (you may choose which of these liquids to drink) over the next 2-3 hours. You should expect results within 1 to 6 hours  after completing the bowel purge.  TAKE A DOSE OF MIRALAX DAILY.  If the bleeding worsens call us or go to the ED.  Stay off your zofran.  I appreciate the opportunity to care for you. Stan Head, MD, [facility]

## 2023-10-10 NOTE — Progress Notes (Addendum)
 Sara [name] ZOXWR 78 y.o. May 09, 1946 [id number]  Assessment & Plan:   Encounter Diagnoses  Name Primary?   Nausea and vomiting, unspecified vomiting type Yes   Abdominal pain, epigastric    Abdominal pain, RLQ    Fecal impaction (HCC)    Rectal bleeding    Multiple upper and lower GI symptoms.  I think the constipation may have developed related to the ondansetron.  She is also or has been on Ozempic currently holding it.  Because of all these problems is not entirely clear as mentioned medication side effect part of it.  She takes Fosamax and is at risk for ulcer disease.  She is currently on omeprazole 20 mg daily.  Evaluation and treatment as follows:  #1 MiraLAX purge to relieve fecal impaction #2 monitor the rectal bleeding if it becomes more severe call back/present to ED #3 Labs to include CBC CMET lipase #4 continue to hold the Ozempic #5 daily MiraLAX after the purge #6 avoid/minimize ondansetron #7 stay on a soft bland diet for now #8\EGD to evaluate upper GI sxs (eoigastric pain and nausea and vomiting)    Subjective:   Chief Complaint: vomiting, constipation and rectal bleeding  HPI  Discussed the use of AI scribe software for clinical note transcription with the patient, who gave verbal consent to proceed.  History of Present Illness   Sara [name] "Sara [name]" is a 78 year old female with diverticulosis and irritable bowel syndrome who presents with abdominal pain and rectal bleeding.  Approximately two months ago, she began experiencing vomiting accompanied by severe abdominal pain. The pain is primarily located in the lower abdomen but can also be felt in the upper abdomen when sitting up. The vomiting preceded the onset of the pain. She was prescribed a medication for nausea, which she believes caused constipation, leading to an impaction that she manually relieved. The vomiting has since resolved, and she is no longer taking the nausea medication. The  abdominal pain persists and is exacerbated by sitting up.  She has a history of diverticulosis, confirmed by a colonoscopy in 2022, and irritable bowel syndrome. Recently, she has experienced rectal bleeding, described as thin and black with clots, occurring both with wiping and in the toilet. The bleeding was less this morning compared to previous episodes.  She reports ongoing constipation despite using prunes and Metamucil, which she started earlier this week. She consumes a teaspoon of Metamucil two to three times and about twelve prunes. She experienced a recent impaction, which she manually relieved.  She has been on semaglutide (Ozempic) for over a year and a half for weight loss. Recently, she reduced the dose due to nausea, skipping her dose last Sunday. She also takes Fosamax weekly for bone health and denies any swallowing difficulties. She spends a lot of time in her recliner to alleviate pain, as sitting up exacerbates her symptoms.       Wt Readings from Last 3 Encounters:  10/10/23 217 lb (98.4 kg)  09/24/23 217 lb (98.4 kg)  09/17/23 224 lb (101.6 kg)    03/10/21 colonoscopy severe left-sided diverticulosis and hemorrhoids  Allergies  Allergen Reactions   Latex Other (See Comments)   Nifedipine Other (See Comments)    Brings blood pressure up really fast PROCARDIA   Tape     bleeding   Codeine Itching   Crestor [Rosuvastatin] Other (See Comments)    myalgias   Current Meds  Medication Sig   albuterol (VENTOLIN HFA) 108 (90 Base)  MCG/ACT inhaler TAKE 2 PUFFS BY MOUTH EVERY 6 HOURS AS NEEDED FOR WHEEZE OR SHORTNESS OF BREATH (Patient taking differently: Inhale 2 puffs into the lungs every 6 (six) hours as needed for wheezing or shortness of breath.)   alendronate (FOSAMAX) 70 MG tablet TAKE 1 TABLET EVERY 7 DAYS WITH A FULL GLASS OF WATER ON AN EMPTY STOMACH   aspirin EC 81 MG tablet Take 81 mg by mouth daily.   budesonide-formoterol (SYMBICORT) 80-4.5 MCG/ACT inhaler  Inhale 2 puffs into the lungs 2 (two) times daily.   Calcium Carbonate-Vitamin D (SUPER CALCIUM 600 + D3 PO) Take 1 tablet by mouth daily.   cyclobenzaprine (FLEXERIL) 5 MG tablet TAKE 1 TABLET BY MOUTH THREE TIMES A DAY AS NEEDED FOR MUSCLE SPASMS   diltiazem (CARDIZEM CD) 120 MG 24 hr capsule TAKE 1 CAPSULE BY MOUTH EVERY DAY   Evolocumab (REPATHA SURECLICK) 140 MG/ML SOAJ INJECT 140 MG INTO THE SKIN EVERY 14 (FOURTEEN) DAYS. CANCEL PRALEUNT RX   furosemide (LASIX) 20 MG tablet Take 1 tablet (20 mg total) by mouth daily.   losartan (COZAAR) 25 MG tablet TAKE 1 TABLET BY MOUTH TWICE A DAY   meclizine (ANTIVERT) 25 MG tablet Take 1 tablet (25 mg total) by mouth 2 (two) times daily as needed for dizziness. Reported on 12/05/2015   mometasone (NASONEX) 50 MCG/ACT nasal spray PLACE 2 SPRAYS INTO THE NOSE DAILY.   nitroGLYCERIN (NITROSTAT) 0.4 MG SL tablet Place 1 tablet (0.4 mg total) under the tongue every 5 (five) minutes as needed for chest pain (x 3 doses). Reported on 12/05/2015   omeprazole (PRILOSEC) 20 MG capsule Take 1 capsule (20 mg total) by mouth daily.   ondansetron (ZOFRAN-ODT) 4 MG disintegrating tablet Take 1 tablet (4 mg total) by mouth every 8 (eight) hours as needed for nausea or vomiting.   potassium chloride (KLOR-CON) 10 MEQ tablet Take 1 tablet (10 mEq total) by mouth daily.   Semaglutide, 1 MG/DOSE, 4 MG/3ML SOPN Inject 1 mg as directed once a week.   spironolactone (ALDACTONE) 25 MG tablet Take 0.5 tablets (12.5 mg total) by mouth 2 (two) times daily.   Past Medical History:  Diagnosis Date   Arthritis    Arthritis -hands -Bil. 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 s/p Xience DES x 3 to LAD;  b. nuc study 02/27/10: EF 67% no ischemia;  c.  echo 11/12: Mild LVH, EF 55-65%, grade 1 diastolic dysfunction, moderate LAE;  d.  LHC 07/03/11: LAD stents patent, RCA 25%, EF  55-65% ;  e. Adeno. Myoview 5/14:  No ischemia, EF 68%   COVID-19 2021   Depression    Diverticular disease    GERD (gastroesophageal reflux disease)    Hemorrhoid    Hepatitis    yellow jaundice as a child    HTN (hypertension)    Hyperlipidemia    Hypertension    Impaired hearing    bilateral- no hearing aids   Myocardial infarction (HCC)    hx of x 2 3-4 years ago per pt   Obesity    OSA (obstructive sleep apnea)    needs no cpap per patient pt denies   Other psoriasis    ongoing- none at present.   Vertigo    Past Surgical History:  Procedure Laterality Date   ABDOMINAL HYSTERECTOMY  BACK SURGERY     Dr. Maron Siskin   CARDIAC CATHETERIZATION  08/23/2011   Ablation AV  Node   CATARACT EXTRACTION Bilateral    COLONOSCOPY     CORONARY ANGIOPLASTY     with stents 04-Dec-2007    KNEE SURGERY     right   LEFT AND RIGHT HEART CATHETERIZATION WITH CORONARY ANGIOGRAM N/A 08/18/2013   Procedure: LEFT AND RIGHT HEART CATHETERIZATION WITH CORONARY ANGIOGRAM;  Surgeon: Darlis Eisenmenger, MD;  Location: [facility] CATH LAB;  Service: Cardiovascular;  Laterality: N/A;   LEFT HEART CATHETERIZATION WITH CORONARY ANGIOGRAM N/A 07/03/2011   Procedure: LEFT HEART CATHETERIZATION WITH CORONARY ANGIOGRAM;  Surgeon: Eilleen Grates, MD;  Location: [facility] CATH LAB;  Service: Cardiovascular;  Laterality: N/A;   REPAIR RECTOCELE     SUPRAVENTRICULAR TACHYCARDIA ABLATION N/A 08/23/2011   Procedure: SUPRAVENTRICULAR TACHYCARDIA ABLATION;  Surgeon: Tammie Fall, MD;  Location: [facility] CATH LAB;  Service: Cardiovascular;  Laterality: N/A;   TOTAL HIP ARTHROPLASTY Right 02/23/2016   Procedure: RIGHT TOTAL HIP ARTHROPLASTY ANTERIOR APPROACH;  Surgeon: Adonica Hoose, MD;  Location: [facility] ORS;  Service: Orthopedics;  Laterality: Right;   TOTAL KNEE ARTHROPLASTY Left 12/22/2012   Procedure: LEFT TOTAL KNEE ARTHROPLASTY;  Surgeon: Aurther Blue, MD;  Location: [facility] ORS;  Service: Orthopedics;  Laterality: Left;   TUBAL LIGATION      UPPER GASTROINTESTINAL ENDOSCOPY     Social History   Social History Narrative   Lives with husband, Allayne Arabian.    1 son died in 12-03-17 from ALS.    family history includes Aneurysm in her father; Breast cancer in her mother; Heart failure in her mother; Hypertension in her mother; Muscular dystrophy in her daughter; Seizures in her brother and son; Stroke in her mother.   Review of Systems  As per HPI Objective:   Physical Exam @BP  112/68   Pulse 66   Ht 5\' 4"  (1.626 m)   Wt 217 lb (98.4 kg)   BMI 37.25 kg/m @  General:  NAD obese woman Eyes:   anicteric Lungs:  clear Heart::  S1S2 no rubs, murmurs or gallops Abdomen:  Obese soft and  mildly tender epigastrium and RLQ, BS+  Patti [place], CMA present. Rectal exam reveals some small external hemorrhoids digital exam with a fecal impaction with multiple firm balls of stool and a small amount of red and darker blood on the finger.  Anoscopy is performed and shows grade 2 internal hemorrhoids which are not currently bleeding.  There is brown stool with some streaks of the blood described above coating some of it.  I do not see any sign of a stercoral ulcer or proctitis.     Ext:   no edema, cyanosis or clubbing    Data Reviewed:  See HPI

## 2023-10-24 ENCOUNTER — Encounter: Payer: Self-pay | Admitting: Internal Medicine

## 2023-10-27 ENCOUNTER — Other Ambulatory Visit: Payer: Self-pay | Admitting: Family Medicine

## 2023-10-27 DIAGNOSIS — I2510 Atherosclerotic heart disease of native coronary artery without angina pectoris: Secondary | ICD-10-CM

## 2023-10-27 DIAGNOSIS — E1169 Type 2 diabetes mellitus with other specified complication: Secondary | ICD-10-CM

## 2023-11-05 ENCOUNTER — Encounter: Payer: Self-pay | Admitting: Internal Medicine

## 2023-11-05 ENCOUNTER — Other Ambulatory Visit

## 2023-11-05 ENCOUNTER — Ambulatory Visit: Admitting: Internal Medicine

## 2023-11-05 VITALS — BP 140/82 | HR 91 | Temp 98.2°F | Resp 27 | Ht 64.0 in | Wt 217.0 lb

## 2023-11-05 DIAGNOSIS — K222 Esophageal obstruction: Secondary | ICD-10-CM | POA: Diagnosis not present

## 2023-11-05 DIAGNOSIS — K449 Diaphragmatic hernia without obstruction or gangrene: Secondary | ICD-10-CM

## 2023-11-05 DIAGNOSIS — R112 Nausea with vomiting, unspecified: Secondary | ICD-10-CM

## 2023-11-05 DIAGNOSIS — R1013 Epigastric pain: Secondary | ICD-10-CM

## 2023-11-05 MED ORDER — SODIUM CHLORIDE 0.9 % IV SOLN: 500.0000 mL | Freq: Once | INTRAVENOUS | Status: DC

## 2023-11-05 NOTE — Progress Notes (Signed)
 History and Physical Interval Note:  11/05/2023 10:02 AM  Sara [name]  has presented today for endoscopic procedure(s), with the diagnosis of  Encounter Diagnosis  Name Primary?   Nausea and vomiting, unspecified vomiting type Yes  .  The various methods of evaluation and treatment have been discussed with the patient and/or family. After consideration of risks, benefits and other options for treatment, the patient has consented to  the endoscopic procedure(s).   The patient's history has been reviewed, patient examined, no change in status, stable for endoscopic procedure(s).  I have reviewed the patient's chart and labs.  Questions were answered to the patient's satisfaction.     Kenney Peacemaker, MD, [facility] Evener

## 2023-11-05 NOTE — Progress Notes (Signed)
 Vitals-DT  Pt's states no medical or surgical changes since previsit or office visit.

## 2023-11-05 NOTE — Patient Instructions (Addendum)
 There was a hiatal hernia and some scar tissue with narrowing where the esophagus and stomach meet. I used some forceps to open up the narrowing some to try to prevent future swallowing problems, should they occur.   I am glad you are feeling better.  Please go to lab and recheck bilirubin level today.  I appreciate the opportunity to care for you. Iva Boop, MD, FACG   YOU HAD AN ENDOSCOPIC PROCEDURE TODAY AT THE [facility]:   Refer to the procedure report that was given to you for any specific questions about what was found during the examination.  If the procedure report does not answer your questions, please call your gastroenterologist to clarify.  If you requested that your care partner not be given the details of your procedure findings, then the procedure report has been included in a sealed envelope for you to review at your convenience later.  YOU SHOULD EXPECT: Some feelings of bloating in the abdomen. Passage of more gas than usual.  Walking can help get rid of the air that was put into your GI tract during the procedure and reduce the bloating. If you had a lower endoscopy (such as a colonoscopy or flexible sigmoidoscopy) you may notice spotting of blood in your stool or on the toilet paper. If you underwent a bowel prep for your procedure, you may not have a normal bowel movement for a few days.  Please Note:  You might notice some irritation and congestion in your nose or some drainage.  This is from the oxygen used during your procedure.  There is no need for concern and it should clear up in a day or so.  SYMPTOMS TO REPORT IMMEDIATELY:  Following upper endoscopy (EGD)  Vomiting of blood or coffee ground material  New chest pain or pain under the shoulder blades  Painful or persistently difficult swallowing  New shortness of breath  Fever of 100F or higher  Black, tarry-looking stools  For urgent or emergent issues, a gastroenterologist can be reached  at any hour by calling [phone]. Do not use MyChart messaging for urgent concerns.    DIET:  We do recommend a small meal at first, but then you may proceed to your regular diet.  Drink plenty of fluids but you should avoid alcoholic beverages for 24 hours.  ACTIVITY:  You should plan to take it easy for the rest of today and you should NOT DRIVE or use heavy machinery until tomorrow (because of the sedation medicines used during the test).    FOLLOW UP: Our staff will call the number listed on your records the next business day following your procedure.  We will call around 7:15- 8:00 am to check on you and address any questions or concerns that you may have regarding the information given to you following your procedure. If we do not reach you, we will leave a message.     If any biopsies were taken you will be contacted by phone or by letter within the next 1-3 weeks.  Please call us at [phone] if you have not heard about the biopsies in 3 weeks.    SIGNATURES/CONFIDENTIALITY: You and/or your care partner have signed paperwork which will be entered into your electronic medical record.  These signatures attest to the fact that that the information above on your After Visit Summary has been reviewed and is understood.  Full responsibility of the confidentiality of this discharge information lies with you and/or  your care-partner.

## 2023-11-05 NOTE — Op Note (Signed)
 [facility] Patient Name: Sara [name] Procedure Date: 11/05/2023 9:54 AM MRN: [id number] Endoscopist: Iva Boop , MD, [id number] Age: 78 Referring MD:  Date of Birth: 1946-03-29 Gender: Female Account #: [id number] Procedure:                Upper GI endoscopy Indications:              Epigastric abdominal pain, Nausea with vomiting Medicines:                Monitored Anesthesia Care Procedure:                Pre-Anesthesia Assessment:                           - Prior to the procedure, a History and Physical                            was performed, and patient medications and                            allergies were reviewed. The patient's tolerance of                            previous anesthesia was also reviewed. The risks                            and benefits of the procedure and the sedation                            options and risks were discussed with the patient.                            All questions were answered, and informed consent                            was obtained. Prior Anticoagulants: The patient has                            taken no anticoagulant or antiplatelet agents. ASA                            Grade Assessment: III - A patient with severe                            systemic disease. After reviewing the risks and                            benefits, the patient was deemed in satisfactory                            condition to undergo the procedure.                           After obtaining informed consent, the endoscope was  passed under direct vision. Throughout the                            procedure, the patient's blood pressure, pulse, and                            oxygen saturations were monitored continuously. The                            Olympus Scope [id number] was introduced through the                            mouth, and advanced to the second part of duodenum.                             The upper GI endoscopy was accomplished without                            difficulty. The patient tolerated the procedure                            well. Scope In: Scope Out: Findings:                 A moderate Schatzki ring was found at the                            gastroesophageal junction. This was biopsied with a                            cold forceps for disruption of ring, no specimens                            sent.                           A 4 cm hiatal hernia was present.                           The exam was otherwise without abnormality.                           The cardia and gastric fundus were otherwise normal                            on retroflexion. Complications:            No immediate complications. Estimated Blood Loss:     Estimated blood loss was minimal. Impression:               - Moderate Schatzki ring. Biopsied to disrupt to                            prevent dysphagia.                           - 4  cm hiatal hernia.                           - The examination was otherwise normal. She has                            improved at this point - was having epigastric pain                            and nausea + vomiting. Also had fecal impaction and                            constipation issues that have resolved. Recommendation:           - Patient has a contact number available for                            emergencies. The signs and symptoms of potential                            delayed complications were discussed with the                            patient. Return to normal activities tomorrow.                            Written discharge instructions were provided to the                            patient.                           - Resume previous diet.                           - Continue present medications.                           - Fractionated bilirubin today - had                            hyperbilirubinemia in March Kenney Peacemaker, MD 11/05/2023 10:30:38 AM This report has been signed electronically.

## 2023-11-05 NOTE — Progress Notes (Signed)
 Sedate, gd SR, tolerated procedure well, VSS, report to RN

## 2023-11-06 ENCOUNTER — Telehealth: Payer: Self-pay | Admitting: *Deleted

## 2023-11-06 LAB — BILIRUBIN, FRACTIONATED(TOT/DIR/INDIR)
Bilirubin, Direct: 0.2 mg/dL (ref 0.0–0.2)
Indirect Bilirubin: 0.5 mg/dL (ref 0.2–1.2)
Total Bilirubin: 0.7 mg/dL (ref 0.2–1.2)

## 2023-11-06 NOTE — Telephone Encounter (Signed)
 Unable to leave message on f/u call ( voice,ail not set up )

## 2023-11-18 ENCOUNTER — Other Ambulatory Visit: Payer: Self-pay | Admitting: Family Medicine

## 2023-11-18 DIAGNOSIS — I152 Hypertension secondary to endocrine disorders: Secondary | ICD-10-CM

## 2023-11-18 NOTE — Telephone Encounter (Signed)
 Pt calling to see if her Ozempic  order has been processed for delivery? Says she has been having phone issues, so she's not sure if Concha Deed has reached out to her or not.

## 2023-11-18 NOTE — Telephone Encounter (Signed)
 Copied from CRM [id number]. Topic: Clinical - Medication Refill >> Nov 18, 2023  8:26 AM Ovid Blow wrote: Most Recent Primary Care Visit:   Medication: potassium chloride  (KLOR-CON ) 10 MEQ tablet  Has the patient contacted their pharmacy? No (Agent: If no, request that the patient contact the pharmacy for the refill. If patient does not wish to contact the pharmacy document the reason why and proceed with request.) (Agent: If yes, when and what did the pharmacy advise?)  Is this the correct pharmacy for this prescription? Yes If no, delete pharmacy and type the correct one.  This is the patient's preferred pharmacy:  [facility] [phone] - [place] [place] - [address] [place] [address] Phone: [phone] Fax: [phone]  [facility] [phone] [place], [place] - [address] [place] [address] Phone: [phone] Fax: [phone]   Has the prescription been filled recently? No  Is the patient out of the medication? No  Has the patient been seen for an appointment in the last year OR does the patient have an upcoming appointment? Yes  Can we respond through MyChart? No  Agent: Please be advised that Rx refills may take up to 3 business days. We ask that you follow-up with your pharmacy.

## 2023-11-18 NOTE — Telephone Encounter (Signed)
 Made Sara [name] aware of Sara [name] update. [name] wants to know if she can go back to taking the 1mg  if she is feeling better? She says she is feeling better. Please advise.   [phone]

## 2023-12-04 NOTE — Progress Notes (Unsigned)
  Cardiology Office Note:   Date:  12/05/2023  ID:  Sara [name], DOB 1945/11/21, MRN [id number] PCP: Eliodoro Guerin, DO  [facility] Providers Cardiologist:  None {  History of Present Illness:   Sara [name] is a 78 y.o. female who presents for follow up of CAD and AVNRT.  She had PCI to the LAD in 10/10.  In 12/12, she was admitted to [facility] with SVT and associated chest pain and elevated troponin.  Cath in 12/12 showed patent LAD stents. She had a right and a left heart cath in 2015 for dyspnea.  She had patent arteries.  She had normal right heart pressures.    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  She had Adenosine  Cardiolite  in 08/09/16  with no evidence for ischemia or infarction.      Since I last saw her she has had no new cardiovascular complaints.  She is lost a total of 52 pounds with Ozempic .  She did have some nausea on higher dose and had to go down.  However, she has been tolerating the lower dose.  She is doing chair exercises. The patient denies any new symptoms such as chest discomfort, neck or arm discomfort. There has been no new shortness of breath, PND or orthopnea. There have been no reported palpitations, presyncope or syncope.   ROS: As stated in the HPI and negative for all other systems.  Studies Reviewed:    EKG:   EKG Interpretation Date/Time:  Thursday Dec 05 2023 11:19:19 EDT Ventricular Rate:  66 PR Interval:  204 QRS Duration:  144 QT Interval:  408 QTC Calculation: 427 R Axis:   -19  Text Interpretation: Normal sinus rhythm Left bundle branch block When compared with ECG of 16-Dec-2012 14:12, No significant change since last tracing Confirmed by Eilleen Grates ([id number]) on 12/05/2023 11:47:22 AM    Risk Assessment/Calculations:              Physical Exam:   VS:  BP 120/74   Pulse 66   Ht 5\' 4"  (1.626 m)   Wt 212 lb (96.2 kg)   SpO2 97%   BMI 36.39 kg/m    Wt Readings from Last 3 Encounters:  12/05/23 212 lb (96.2 kg)   11/05/23 217 lb (98.4 kg)  10/10/23 217 lb (98.4 kg)     GEN: Well nourished, well developed in no acute distress NECK: No JVD; No carotid bruits CARDIAC: RRR, no murmurs, rubs, gallops RESPIRATORY:  Clear to auscultation without rales, wheezing or rhonchi  ABDOMEN: Soft, non-tender, non-distended EXTREMITIES:  No edema; No deformity   ASSESSMENT AND PLAN:   CAD:   The patient has no new sypmtoms.  No further cardiovascular testing is indicated.  We will continue with aggressive risk reduction and meds as listed.   AVNRT:     She has no further arrhythmias.  No change in therapy.   OBESITY: I am very proud of her weight loss.  No change in therapy.  IVCD: Has had no symptomatic bradycardia.  No change in therapy.     DYSLIPIDEMIA: Her LDL is 72.  No change in therapy.   HTN:   Her BP is at target.  No change in therapy.  At target.  No change in therapy.       Follow up with me in 1 year  Signed, Eilleen Grates, MD

## 2023-12-05 ENCOUNTER — Ambulatory Visit: Admitting: Cardiology

## 2023-12-05 ENCOUNTER — Encounter: Payer: Self-pay | Admitting: Cardiology

## 2023-12-05 VITALS — BP 120/74 | HR 66 | Ht 64.0 in | Wt 212.0 lb

## 2023-12-05 DIAGNOSIS — E785 Hyperlipidemia, unspecified: Secondary | ICD-10-CM

## 2023-12-05 DIAGNOSIS — I10 Essential (primary) hypertension: Secondary | ICD-10-CM

## 2023-12-05 DIAGNOSIS — I2510 Atherosclerotic heart disease of native coronary artery without angina pectoris: Secondary | ICD-10-CM

## 2023-12-05 NOTE — Patient Instructions (Signed)
 Medication Instructions:  Your physician recommends that you continue on your current medications as directed. Please refer to the Current Medication list given to you today.   Labwork: None today  Testing/Procedures: None today  Follow-Up: 1 year with Dr.Hochrein  Any Other Special Instructions Will Be Listed Below (If Applicable).  If you need a refill on your cardiac medications before your next appointment, please call your pharmacy.    Thank you for choosing [facility] !

## 2023-12-19 DIAGNOSIS — M9901 Segmental and somatic dysfunction of cervical region: Secondary | ICD-10-CM | POA: Diagnosis not present

## 2023-12-19 DIAGNOSIS — M62830 Muscle spasm of back: Secondary | ICD-10-CM | POA: Diagnosis not present

## 2023-12-19 DIAGNOSIS — M9903 Segmental and somatic dysfunction of lumbar region: Secondary | ICD-10-CM | POA: Diagnosis not present

## 2023-12-19 DIAGNOSIS — M9902 Segmental and somatic dysfunction of thoracic region: Secondary | ICD-10-CM | POA: Diagnosis not present

## 2023-12-23 ENCOUNTER — Encounter: Payer: Self-pay | Admitting: Family Medicine

## 2023-12-23 ENCOUNTER — Ambulatory Visit: Payer: Medicare HMO | Admitting: Family Medicine

## 2023-12-23 VITALS — BP 99/62 | HR 68 | Temp 98.5°F | Ht 64.0 in | Wt 215.0 lb

## 2023-12-23 DIAGNOSIS — E1169 Type 2 diabetes mellitus with other specified complication: Secondary | ICD-10-CM

## 2023-12-23 DIAGNOSIS — M9903 Segmental and somatic dysfunction of lumbar region: Secondary | ICD-10-CM | POA: Diagnosis not present

## 2023-12-23 DIAGNOSIS — G720 Drug-induced myopathy: Secondary | ICD-10-CM

## 2023-12-23 DIAGNOSIS — E119 Type 2 diabetes mellitus without complications: Secondary | ICD-10-CM | POA: Diagnosis not present

## 2023-12-23 DIAGNOSIS — I152 Hypertension secondary to endocrine disorders: Secondary | ICD-10-CM | POA: Diagnosis not present

## 2023-12-23 DIAGNOSIS — E1159 Type 2 diabetes mellitus with other circulatory complications: Secondary | ICD-10-CM | POA: Diagnosis not present

## 2023-12-23 DIAGNOSIS — E785 Hyperlipidemia, unspecified: Secondary | ICD-10-CM | POA: Diagnosis not present

## 2023-12-23 DIAGNOSIS — M9902 Segmental and somatic dysfunction of thoracic region: Secondary | ICD-10-CM | POA: Diagnosis not present

## 2023-12-23 DIAGNOSIS — I2510 Atherosclerotic heart disease of native coronary artery without angina pectoris: Secondary | ICD-10-CM

## 2023-12-23 DIAGNOSIS — M9901 Segmental and somatic dysfunction of cervical region: Secondary | ICD-10-CM | POA: Diagnosis not present

## 2023-12-23 DIAGNOSIS — Z7985 Long-term (current) use of injectable non-insulin antidiabetic drugs: Secondary | ICD-10-CM

## 2023-12-23 DIAGNOSIS — M62830 Muscle spasm of back: Secondary | ICD-10-CM | POA: Diagnosis not present

## 2023-12-23 LAB — BAYER DCA HB A1C WAIVED: HB A1C (BAYER DCA - WAIVED): 5.2 % (ref 4.8–5.6)

## 2023-12-23 NOTE — Progress Notes (Signed)
 Subjective: CC:DM PCP: Eliodoro Guerin, DO IRC:VELFY Sara [name] is a 78 y.o. female presenting to clinic today for:  1. Type 2 Diabetes with hypertension, hyperlipidemia w/ CAD:  Compliant with meds. Went down on the Ozempic  dose temporarily due to some GI issues but that resolved and she is back on 1mg . Getting through PAP with Concha Deed.   Diabetes Health Maintenance Due  Topic Date Due   OPHTHALMOLOGY EXAM  06/19/2023   HEMOGLOBIN A1C  11/18/2023   FOOT EXAM  12/22/2024    Last A1c:  Lab Results  Component Value Date   HGBA1C 5.3 05/20/2023    ROS: no falls. Having some back pain. Seeing chiropractor today for this.   ROS: Per HPI  Allergies  Allergen Reactions   Latex Other (See Comments)   Nifedipine Other (See Comments)    Brings blood pressure up really fast PROCARDIA   Tape Other (See Comments)    bleeding   Codeine Itching   Crestor  [Rosuvastatin ] Other (See Comments)    myalgias   Past Medical History:  Diagnosis Date   Arthritis    Arthritis -hands -Bil. 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 s/p Xience DES x 3 to LAD;  b. nuc study 02/27/10: EF 67% no ischemia;  c.  echo 11/12: Mild LVH, EF 55-65%, grade 1 diastolic dysfunction, moderate LAE;  d.  LHC 07/03/11: LAD stents patent, RCA 25%, EF 55-65% ;  e. Adeno. Myoview  5/14:  No ischemia, EF 68%   COVID-19 2021   Depression    Diverticular disease    Edema 02/28/2009   Qualifier: Diagnosis of   By: Julane Ny, MD, Dois Freeze        GERD (gastroesophageal reflux disease)    Hemorrhoid    Hepatitis    yellow jaundice as a child    HTN (hypertension)    Hyperlipidemia    Hypertension    Impaired hearing    bilateral- no hearing aids   Myocardial infarction (HCC)    hx of x 2 3-4 years ago per pt   Obesity    OSA (obstructive sleep apnea)    needs no cpap per patient pt denies    Other psoriasis    ongoing- none at present.   Vertigo    VERTIGO 12/09/2008   Qualifier: Diagnosis of   By: Amalia Badder          Current Outpatient Medications:    albuterol  (VENTOLIN  HFA) 108 (90 Base) MCG/ACT inhaler, TAKE 2 PUFFS BY MOUTH EVERY 6 HOURS AS NEEDED FOR WHEEZE OR SHORTNESS OF BREATH (Patient taking differently: Inhale 2 puffs into the lungs every 6 (six) hours as needed for wheezing or shortness of breath.), Disp: 18 each, Rfl: 1   alendronate  (FOSAMAX ) 70 MG tablet, TAKE 1 TABLET EVERY 7 DAYS WITH A FULL GLASS OF WATER  ON AN EMPTY STOMACH, Disp: 12 tablet, Rfl: 3   aspirin  EC 81 MG tablet, Take 81 mg by mouth daily., Disp: , Rfl:    budesonide -formoterol  (SYMBICORT ) 80-4.5 MCG/ACT inhaler, Inhale 2 puffs into the lungs 2 (two) times daily., Disp: 1 each, Rfl: 3   Calcium  Carbonate-Vitamin D  (SUPER CALCIUM  600 + D3 PO), Take 1 tablet by mouth daily., Disp: , Rfl:    cyclobenzaprine  (FLEXERIL ) 5 MG tablet, TAKE 1 TABLET BY MOUTH THREE TIMES A  DAY AS NEEDED FOR MUSCLE SPASMS, Disp: 30 tablet, Rfl: 0   diltiazem  (CARDIZEM  CD) 120 MG 24 hr capsule, TAKE 1 CAPSULE BY MOUTH EVERY DAY, Disp: 90 capsule, Rfl: 3   Evolocumab  (REPATHA  SURECLICK) 140 MG/ML SOAJ, INJECT 140 MG INTO THE SKIN EVERY 14 (FOURTEEN) DAYS. CANCEL PRALEUNT RX, Disp: 6 mL, Rfl: 0   furosemide  (LASIX ) 20 MG tablet, Take 1 tablet (20 mg total) by mouth daily., Disp: 90 tablet, Rfl: 3   losartan  (COZAAR ) 25 MG tablet, TAKE 1 TABLET BY MOUTH TWICE A DAY, Disp: 180 tablet, Rfl: 0   meclizine  (ANTIVERT ) 25 MG tablet, Take 1 tablet (25 mg total) by mouth 2 (two) times daily as needed for dizziness. Reported on 12/05/2015, Disp: 30 tablet, Rfl: 1   mometasone  (NASONEX ) 50 MCG/ACT nasal spray, PLACE 2 SPRAYS INTO THE NOSE DAILY., Disp: 17 each, Rfl: 5   nitroGLYCERIN  (NITROSTAT ) 0.4 MG SL tablet, Place 1 tablet (0.4 mg total) under the tongue every 5 (five) minutes as needed for chest pain (x 3 doses). Reported on  12/05/2015, Disp: 25 tablet, Rfl: 1   omeprazole  (PRILOSEC) 20 MG capsule, Take 1 capsule (20 mg total) by mouth daily., Disp: 90 capsule, Rfl: 3   ondansetron  (ZOFRAN -ODT) 4 MG disintegrating tablet, Take 1 tablet (4 mg total) by mouth every 8 (eight) hours as needed for nausea or vomiting., Disp: 20 tablet, Rfl: 1   potassium chloride  (KLOR-CON ) 10 MEQ tablet, Take 1 tablet (10 mEq total) by mouth daily., Disp: 90 tablet, Rfl: 3   Semaglutide , 1 MG/DOSE, 4 MG/3ML SOPN, Inject 1 mg as directed once a week., Disp: 3 mL, Rfl: 0   spironolactone  (ALDACTONE ) 25 MG tablet, Take 0.5 tablets (12.5 mg total) by mouth 2 (two) times daily., Disp: 90 tablet, Rfl: 3 Social History   Socioeconomic History   Marital status: Married    Spouse name: Allayne Arabian   Number of children: 3   Years of education: Not on file   Highest education level: Not on file  Occupational History   Occupation: hairdresser  Tobacco Use   Smoking status: Never   Smokeless tobacco: Never  Vaping Use   Vaping status: Never Used  Substance and Sexual Activity   Alcohol  use: No   Drug use: No   Sexual activity: Not Currently    Birth control/protection: Post-menopausal  Other Topics Concern   Not on file  Social History Narrative   Lives with husband, Allayne Arabian.    1 son died in Jan 10, 2018 from ALS.    Social Drivers of Corporate investment banker Strain: Low Risk  (09/17/2023)   Overall Financial Resource Strain (CARDIA)    Difficulty of Paying Living Expenses: Not hard at all  Food Insecurity: No Food Insecurity (09/17/2023)   Hunger Vital Sign    Worried About Running Out of Food in the Last Year: Never true    Ran Out of Food in the Last Year: Never true  Transportation Needs: No Transportation Needs (09/17/2023)   PRAPARE - Administrator, Civil Service (Medical): No    Lack of Transportation (Non-Medical): No  Physical Activity: Insufficiently Active (09/17/2023)   Exercise Vital Sign    Days of Exercise per  Week: 3 days    Minutes of Exercise per Session: 30 min  Stress: No Stress Concern Present (09/17/2023)   [employer] of Occupational Health - Occupational Stress Questionnaire    Feeling of Stress : Not at all  Social Connections: Socially Integrated (  09/17/2023)   Social Connection and Isolation Panel [NHANES]    Frequency of Communication with Friends and Family: More than three times a week    Frequency of Social Gatherings with Friends and Family: Three times a week    Attends Religious Services: More than 4 times per year    Active Member of Clubs or Organizations: Yes    Attends Banker Meetings: More than 4 times per year    Marital Status: Married  Catering manager Violence: Not At Risk (09/17/2023)   Humiliation, Afraid, Rape, and Kick questionnaire    Fear of Current or Ex-Partner: No    Emotionally Abused: No    Physically Abused: No    Sexually Abused: No   Family History  Problem Relation Age of Onset   Stroke Mother    Hypertension Mother    Heart failure Mother    Breast cancer Mother    Aneurysm Father    Seizures Brother    Muscular dystrophy Daughter        dx as infant, passed age 23   Seizures Son    ALS Son    Colon cancer Neg Hx    Esophageal cancer Neg Hx    Rectal cancer Neg Hx    Stomach cancer Neg Hx     Objective: Office vital signs reviewed. BP 99/62   Pulse 68   Temp 98.5 F (36.9 C)   Ht 5\' 4"  (1.626 m)   Wt 215 lb (97.5 kg)   SpO2 93%   BMI 36.90 kg/m   Physical Examination:  General: Awake, alert, well nourished, No acute distress HEENT: sclera white, MMM Cardio: regular rate and rhythm, S1S2 heard, no murmurs appreciated Pulm: clear to auscultation bilaterally, no wheezes, rhonchi or rales; normal work of breathing on room air GI: soft, non-tender, non-distended, bowel sounds present x4, no hepatomegaly, no splenomegaly, no masses    Diabetic Foot Exam - Simple   Simple Foot Form Diabetic Foot exam was  performed with the following findings: Yes 12/23/2023 10:08 AM  Visual Inspection See comments: Yes Sensation Testing See comments: Yes Pulse Check Posterior Tibialis and Dorsalis pulse intact bilaterally: Yes Comments Absent monofilament sensation on right.  Hammer toe present.  Hallucis valgus deformity bilaterally.     Assessment/ Plan: 78 y.o. female   Diabetes mellitus treated with injections of non-insulin medication (HCC) - Plan: Microalbumin / creatinine urine ratio, Bayer DCA Hb A1c Waived  Hyperlipidemia associated with type 2 diabetes mellitus (HCC)  Drug-induced myopathy  Hypertension associated with diabetes (HCC)  Coronary artery disease involving native coronary artery of native heart without angina pectoris  Check A1c. Not due for cholesterol yet.  Continue repatha , Ozempic . BP borderline too soft. Advised to monitor and contact me in 1 week with readings. May consider reducing losartan  to 1/2 tab.   Will reach out to clinical pharmacy re: Ozempic  PAP.  Eliodoro Guerin, DO [facility] [phone]

## 2023-12-24 ENCOUNTER — Ambulatory Visit: Payer: Self-pay | Admitting: Family Medicine

## 2023-12-24 LAB — MICROALBUMIN / CREATININE URINE RATIO
Creatinine, Urine: 34.4 mg/dL
Microalb/Creat Ratio: <9 mg/g{creat} (ref 0–29)
Microalbumin, Urine: <3 ug/mL

## 2023-12-25 DIAGNOSIS — M9901 Segmental and somatic dysfunction of cervical region: Secondary | ICD-10-CM | POA: Diagnosis not present

## 2023-12-25 DIAGNOSIS — M9902 Segmental and somatic dysfunction of thoracic region: Secondary | ICD-10-CM | POA: Diagnosis not present

## 2023-12-25 DIAGNOSIS — M62830 Muscle spasm of back: Secondary | ICD-10-CM | POA: Diagnosis not present

## 2023-12-25 DIAGNOSIS — M9903 Segmental and somatic dysfunction of lumbar region: Secondary | ICD-10-CM | POA: Diagnosis not present

## 2023-12-26 DIAGNOSIS — M9901 Segmental and somatic dysfunction of cervical region: Secondary | ICD-10-CM | POA: Diagnosis not present

## 2023-12-26 DIAGNOSIS — M9903 Segmental and somatic dysfunction of lumbar region: Secondary | ICD-10-CM | POA: Diagnosis not present

## 2023-12-26 DIAGNOSIS — M9902 Segmental and somatic dysfunction of thoracic region: Secondary | ICD-10-CM | POA: Diagnosis not present

## 2023-12-26 DIAGNOSIS — M62830 Muscle spasm of back: Secondary | ICD-10-CM | POA: Diagnosis not present

## 2023-12-27 ENCOUNTER — Telehealth: Payer: Self-pay | Admitting: Pharmacist

## 2023-12-27 NOTE — Telephone Encounter (Signed)
   Patient enrolled in the [employer] patient assistance program for Ozempic .  Patient has landed on Ozempic  1mg  weekly and doing well.  Most recent A1c was 5.2% on 12/23/23.  She denies side effects.  She reports positive visit with cardiology today.  Encouraged patient to reach out as needed   Marvell Slider, PharmD, BCACP, CPP Clinical Pharmacist, [facility]

## 2023-12-30 DIAGNOSIS — M9903 Segmental and somatic dysfunction of lumbar region: Secondary | ICD-10-CM | POA: Diagnosis not present

## 2023-12-30 DIAGNOSIS — M9901 Segmental and somatic dysfunction of cervical region: Secondary | ICD-10-CM | POA: Diagnosis not present

## 2023-12-30 DIAGNOSIS — M62830 Muscle spasm of back: Secondary | ICD-10-CM | POA: Diagnosis not present

## 2023-12-30 DIAGNOSIS — M9902 Segmental and somatic dysfunction of thoracic region: Secondary | ICD-10-CM | POA: Diagnosis not present

## 2024-01-01 DIAGNOSIS — M9902 Segmental and somatic dysfunction of thoracic region: Secondary | ICD-10-CM | POA: Diagnosis not present

## 2024-01-01 DIAGNOSIS — M9903 Segmental and somatic dysfunction of lumbar region: Secondary | ICD-10-CM | POA: Diagnosis not present

## 2024-01-01 DIAGNOSIS — M62830 Muscle spasm of back: Secondary | ICD-10-CM | POA: Diagnosis not present

## 2024-01-01 DIAGNOSIS — M9901 Segmental and somatic dysfunction of cervical region: Secondary | ICD-10-CM | POA: Diagnosis not present

## 2024-01-02 DIAGNOSIS — M62830 Muscle spasm of back: Secondary | ICD-10-CM | POA: Diagnosis not present

## 2024-01-02 DIAGNOSIS — M9902 Segmental and somatic dysfunction of thoracic region: Secondary | ICD-10-CM | POA: Diagnosis not present

## 2024-01-02 DIAGNOSIS — M9903 Segmental and somatic dysfunction of lumbar region: Secondary | ICD-10-CM | POA: Diagnosis not present

## 2024-01-02 DIAGNOSIS — M9901 Segmental and somatic dysfunction of cervical region: Secondary | ICD-10-CM | POA: Diagnosis not present

## 2024-01-06 DIAGNOSIS — M9903 Segmental and somatic dysfunction of lumbar region: Secondary | ICD-10-CM | POA: Diagnosis not present

## 2024-01-06 DIAGNOSIS — M9902 Segmental and somatic dysfunction of thoracic region: Secondary | ICD-10-CM | POA: Diagnosis not present

## 2024-01-06 DIAGNOSIS — M9901 Segmental and somatic dysfunction of cervical region: Secondary | ICD-10-CM | POA: Diagnosis not present

## 2024-01-06 DIAGNOSIS — M62830 Muscle spasm of back: Secondary | ICD-10-CM | POA: Diagnosis not present

## 2024-01-08 DIAGNOSIS — M9903 Segmental and somatic dysfunction of lumbar region: Secondary | ICD-10-CM | POA: Diagnosis not present

## 2024-01-08 DIAGNOSIS — M62830 Muscle spasm of back: Secondary | ICD-10-CM | POA: Diagnosis not present

## 2024-01-08 DIAGNOSIS — M9901 Segmental and somatic dysfunction of cervical region: Secondary | ICD-10-CM | POA: Diagnosis not present

## 2024-01-08 DIAGNOSIS — M9902 Segmental and somatic dysfunction of thoracic region: Secondary | ICD-10-CM | POA: Diagnosis not present

## 2024-01-09 ENCOUNTER — Telehealth: Payer: Self-pay

## 2024-01-09 DIAGNOSIS — F419 Anxiety disorder, unspecified: Secondary | ICD-10-CM | POA: Diagnosis not present

## 2024-01-09 DIAGNOSIS — M62830 Muscle spasm of back: Secondary | ICD-10-CM | POA: Diagnosis not present

## 2024-01-09 DIAGNOSIS — I130 Hypertensive heart and chronic kidney disease with heart failure and stage 1 through stage 4 chronic kidney disease, or unspecified chronic kidney disease: Secondary | ICD-10-CM | POA: Diagnosis not present

## 2024-01-09 DIAGNOSIS — E1122 Type 2 diabetes mellitus with diabetic chronic kidney disease: Secondary | ICD-10-CM | POA: Diagnosis not present

## 2024-01-09 DIAGNOSIS — L4050 Arthropathic psoriasis, unspecified: Secondary | ICD-10-CM | POA: Diagnosis not present

## 2024-01-09 DIAGNOSIS — M9902 Segmental and somatic dysfunction of thoracic region: Secondary | ICD-10-CM | POA: Diagnosis not present

## 2024-01-09 DIAGNOSIS — Z833 Family history of diabetes mellitus: Secondary | ICD-10-CM | POA: Diagnosis not present

## 2024-01-09 DIAGNOSIS — R32 Unspecified urinary incontinence: Secondary | ICD-10-CM | POA: Diagnosis not present

## 2024-01-09 DIAGNOSIS — Z008 Encounter for other general examination: Secondary | ICD-10-CM | POA: Diagnosis not present

## 2024-01-09 DIAGNOSIS — M9901 Segmental and somatic dysfunction of cervical region: Secondary | ICD-10-CM | POA: Diagnosis not present

## 2024-01-09 DIAGNOSIS — M9903 Segmental and somatic dysfunction of lumbar region: Secondary | ICD-10-CM | POA: Diagnosis not present

## 2024-01-09 DIAGNOSIS — Z8249 Family history of ischemic heart disease and other diseases of the circulatory system: Secondary | ICD-10-CM | POA: Diagnosis not present

## 2024-01-09 DIAGNOSIS — E785 Hyperlipidemia, unspecified: Secondary | ICD-10-CM | POA: Diagnosis not present

## 2024-01-09 DIAGNOSIS — F324 Major depressive disorder, single episode, in partial remission: Secondary | ICD-10-CM | POA: Diagnosis not present

## 2024-01-09 DIAGNOSIS — I509 Heart failure, unspecified: Secondary | ICD-10-CM | POA: Diagnosis not present

## 2024-01-09 DIAGNOSIS — Z7982 Long term (current) use of aspirin: Secondary | ICD-10-CM | POA: Diagnosis not present

## 2024-01-13 DIAGNOSIS — M9903 Segmental and somatic dysfunction of lumbar region: Secondary | ICD-10-CM | POA: Diagnosis not present

## 2024-01-13 DIAGNOSIS — M9902 Segmental and somatic dysfunction of thoracic region: Secondary | ICD-10-CM | POA: Diagnosis not present

## 2024-01-13 DIAGNOSIS — M9901 Segmental and somatic dysfunction of cervical region: Secondary | ICD-10-CM | POA: Diagnosis not present

## 2024-01-13 DIAGNOSIS — M62830 Muscle spasm of back: Secondary | ICD-10-CM | POA: Diagnosis not present

## 2024-01-15 DIAGNOSIS — M9901 Segmental and somatic dysfunction of cervical region: Secondary | ICD-10-CM | POA: Diagnosis not present

## 2024-01-15 DIAGNOSIS — M9903 Segmental and somatic dysfunction of lumbar region: Secondary | ICD-10-CM | POA: Diagnosis not present

## 2024-01-15 DIAGNOSIS — M9902 Segmental and somatic dysfunction of thoracic region: Secondary | ICD-10-CM | POA: Diagnosis not present

## 2024-01-15 DIAGNOSIS — M62830 Muscle spasm of back: Secondary | ICD-10-CM | POA: Diagnosis not present

## 2024-01-16 DIAGNOSIS — M9902 Segmental and somatic dysfunction of thoracic region: Secondary | ICD-10-CM | POA: Diagnosis not present

## 2024-01-16 DIAGNOSIS — M62830 Muscle spasm of back: Secondary | ICD-10-CM | POA: Diagnosis not present

## 2024-01-16 DIAGNOSIS — M9901 Segmental and somatic dysfunction of cervical region: Secondary | ICD-10-CM | POA: Diagnosis not present

## 2024-01-16 DIAGNOSIS — M9903 Segmental and somatic dysfunction of lumbar region: Secondary | ICD-10-CM | POA: Diagnosis not present

## 2024-01-19 ENCOUNTER — Other Ambulatory Visit: Payer: Self-pay | Admitting: Family Medicine

## 2024-01-19 DIAGNOSIS — E1159 Type 2 diabetes mellitus with other circulatory complications: Secondary | ICD-10-CM

## 2024-01-20 DIAGNOSIS — M62830 Muscle spasm of back: Secondary | ICD-10-CM | POA: Diagnosis not present

## 2024-01-20 DIAGNOSIS — M9901 Segmental and somatic dysfunction of cervical region: Secondary | ICD-10-CM | POA: Diagnosis not present

## 2024-01-20 DIAGNOSIS — M9902 Segmental and somatic dysfunction of thoracic region: Secondary | ICD-10-CM | POA: Diagnosis not present

## 2024-01-20 DIAGNOSIS — M9903 Segmental and somatic dysfunction of lumbar region: Secondary | ICD-10-CM | POA: Diagnosis not present

## 2024-01-30 DIAGNOSIS — M9903 Segmental and somatic dysfunction of lumbar region: Secondary | ICD-10-CM | POA: Diagnosis not present

## 2024-01-30 DIAGNOSIS — M9901 Segmental and somatic dysfunction of cervical region: Secondary | ICD-10-CM | POA: Diagnosis not present

## 2024-01-30 DIAGNOSIS — M62830 Muscle spasm of back: Secondary | ICD-10-CM | POA: Diagnosis not present

## 2024-01-30 DIAGNOSIS — M9902 Segmental and somatic dysfunction of thoracic region: Secondary | ICD-10-CM | POA: Diagnosis not present

## 2024-02-03 DIAGNOSIS — M9903 Segmental and somatic dysfunction of lumbar region: Secondary | ICD-10-CM | POA: Diagnosis not present

## 2024-02-03 DIAGNOSIS — M9901 Segmental and somatic dysfunction of cervical region: Secondary | ICD-10-CM | POA: Diagnosis not present

## 2024-02-03 DIAGNOSIS — M9902 Segmental and somatic dysfunction of thoracic region: Secondary | ICD-10-CM | POA: Diagnosis not present

## 2024-02-03 DIAGNOSIS — M62830 Muscle spasm of back: Secondary | ICD-10-CM | POA: Diagnosis not present

## 2024-02-06 DIAGNOSIS — M62830 Muscle spasm of back: Secondary | ICD-10-CM | POA: Diagnosis not present

## 2024-02-06 DIAGNOSIS — M9901 Segmental and somatic dysfunction of cervical region: Secondary | ICD-10-CM | POA: Diagnosis not present

## 2024-02-06 DIAGNOSIS — M9903 Segmental and somatic dysfunction of lumbar region: Secondary | ICD-10-CM | POA: Diagnosis not present

## 2024-02-06 DIAGNOSIS — M9902 Segmental and somatic dysfunction of thoracic region: Secondary | ICD-10-CM | POA: Diagnosis not present

## 2024-02-13 DIAGNOSIS — M62830 Muscle spasm of back: Secondary | ICD-10-CM | POA: Diagnosis not present

## 2024-02-13 DIAGNOSIS — M9902 Segmental and somatic dysfunction of thoracic region: Secondary | ICD-10-CM | POA: Diagnosis not present

## 2024-02-13 DIAGNOSIS — M9901 Segmental and somatic dysfunction of cervical region: Secondary | ICD-10-CM | POA: Diagnosis not present

## 2024-02-13 DIAGNOSIS — M9903 Segmental and somatic dysfunction of lumbar region: Secondary | ICD-10-CM | POA: Diagnosis not present

## 2024-02-26 ENCOUNTER — Other Ambulatory Visit: Payer: Self-pay | Admitting: Family Medicine

## 2024-02-26 DIAGNOSIS — E785 Hyperlipidemia, unspecified: Secondary | ICD-10-CM

## 2024-02-26 DIAGNOSIS — I2510 Atherosclerotic heart disease of native coronary artery without angina pectoris: Secondary | ICD-10-CM

## 2024-02-27 DIAGNOSIS — M9901 Segmental and somatic dysfunction of cervical region: Secondary | ICD-10-CM | POA: Diagnosis not present

## 2024-02-27 DIAGNOSIS — M62830 Muscle spasm of back: Secondary | ICD-10-CM | POA: Diagnosis not present

## 2024-02-27 DIAGNOSIS — M9903 Segmental and somatic dysfunction of lumbar region: Secondary | ICD-10-CM | POA: Diagnosis not present

## 2024-02-27 DIAGNOSIS — M9902 Segmental and somatic dysfunction of thoracic region: Secondary | ICD-10-CM | POA: Diagnosis not present

## 2024-03-02 ENCOUNTER — Telehealth: Payer: Self-pay

## 2024-03-02 NOTE — Telephone Encounter (Signed)
 Patient assistance medication arrived  Ozempic  1 X 3mL 4 pcs - 4mg /67mL lot # [id number] exp 04/21/26  Called patient and notified ready to pick up

## 2024-03-26 DIAGNOSIS — M9901 Segmental and somatic dysfunction of cervical region: Secondary | ICD-10-CM | POA: Diagnosis not present

## 2024-03-26 DIAGNOSIS — M9903 Segmental and somatic dysfunction of lumbar region: Secondary | ICD-10-CM | POA: Diagnosis not present

## 2024-03-26 DIAGNOSIS — M9902 Segmental and somatic dysfunction of thoracic region: Secondary | ICD-10-CM | POA: Diagnosis not present

## 2024-03-26 DIAGNOSIS — M62830 Muscle spasm of back: Secondary | ICD-10-CM | POA: Diagnosis not present

## 2024-05-01 ENCOUNTER — Other Ambulatory Visit: Payer: Self-pay | Admitting: Family Medicine

## 2024-05-01 DIAGNOSIS — Z1231 Encounter for screening mammogram for malignant neoplasm of breast: Secondary | ICD-10-CM

## 2024-05-06 ENCOUNTER — Other Ambulatory Visit: Payer: Self-pay | Admitting: Family Medicine

## 2024-05-06 DIAGNOSIS — M810 Age-related osteoporosis without current pathological fracture: Secondary | ICD-10-CM

## 2024-05-11 ENCOUNTER — Emergency Department (HOSPITAL_BASED_OUTPATIENT_CLINIC_OR_DEPARTMENT_OTHER): Admitting: Radiology

## 2024-05-11 ENCOUNTER — Ambulatory Visit: Admitting: Family Medicine

## 2024-05-11 ENCOUNTER — Emergency Department (HOSPITAL_BASED_OUTPATIENT_CLINIC_OR_DEPARTMENT_OTHER)
Admission: EM | Admit: 2024-05-11 | Discharge: 2024-05-11 | Disposition: A | Discharge status: Home / Self Care | Source: Ambulatory Visit | Attending: Emergency Medicine | Admitting: Emergency Medicine

## 2024-05-11 ENCOUNTER — Emergency Department (HOSPITAL_BASED_OUTPATIENT_CLINIC_OR_DEPARTMENT_OTHER)

## 2024-05-11 ENCOUNTER — Other Ambulatory Visit: Payer: Self-pay

## 2024-05-11 VITALS — BP 132/70 | HR 85 | Temp 98.3°F

## 2024-05-11 DIAGNOSIS — Z8673 Personal history of transient ischemic attack (TIA), and cerebral infarction without residual deficits: Secondary | ICD-10-CM | POA: Diagnosis not present

## 2024-05-11 DIAGNOSIS — Z79899 Other long term (current) drug therapy: Secondary | ICD-10-CM | POA: Insufficient documentation

## 2024-05-11 DIAGNOSIS — R079 Chest pain, unspecified: Secondary | ICD-10-CM | POA: Diagnosis not present

## 2024-05-11 DIAGNOSIS — Z7982 Long term (current) use of aspirin: Secondary | ICD-10-CM | POA: Diagnosis not present

## 2024-05-11 DIAGNOSIS — Z91040 Latex allergy status: Secondary | ICD-10-CM | POA: Insufficient documentation

## 2024-05-11 DIAGNOSIS — I672 Cerebral atherosclerosis: Secondary | ICD-10-CM | POA: Diagnosis not present

## 2024-05-11 DIAGNOSIS — M542 Cervicalgia: Secondary | ICD-10-CM | POA: Diagnosis not present

## 2024-05-11 DIAGNOSIS — R0789 Other chest pain: Secondary | ICD-10-CM

## 2024-05-11 DIAGNOSIS — H93A9 Pulsatile tinnitus, unspecified ear: Secondary | ICD-10-CM | POA: Diagnosis not present

## 2024-05-11 DIAGNOSIS — R9089 Other abnormal findings on diagnostic imaging of central nervous system: Secondary | ICD-10-CM | POA: Diagnosis not present

## 2024-05-11 DIAGNOSIS — R9389 Abnormal findings on diagnostic imaging of other specified body structures: Secondary | ICD-10-CM | POA: Diagnosis not present

## 2024-05-11 DIAGNOSIS — R9431 Abnormal electrocardiogram [ECG] [EKG]: Secondary | ICD-10-CM | POA: Diagnosis not present

## 2024-05-11 LAB — CBC
HCT: 39.2 % (ref 36.0–46.0)
Hemoglobin: 13.6 g/dL (ref 12.0–15.0)
MCH: 32.8 pg (ref 26.0–34.0)
MCHC: 34.7 g/dL (ref 30.0–36.0)
MCV: 94.5 fL (ref 80.0–100.0)
Platelets: 173 K/uL (ref 150–400)
RBC: 4.15 MIL/uL (ref 3.87–5.11)
RDW: 13.2 % (ref 11.5–15.5)
WBC: 7.5 K/uL (ref 4.0–10.5)
nRBC: 0 % (ref 0.0–0.2)

## 2024-05-11 LAB — BASIC METABOLIC PANEL WITH GFR
Anion gap: 9 (ref 5–15)
BUN: 12 mg/dL (ref 8–23)
CO2: 24 mmol/L (ref 22–32)
Calcium: 9.3 mg/dL (ref 8.9–10.3)
Chloride: 104 mmol/L (ref 98–111)
Creatinine, Ser: 0.75 mg/dL (ref 0.44–1.00)
GFR, Estimated: >60 mL/min (ref >60)
Glucose, Bld: 129 mg/dL — ABNORMAL HIGH (ref 70–99)
Potassium: 4.3 mmol/L (ref 3.5–5.1)
Sodium: 137 mmol/L (ref 135–145)

## 2024-05-11 LAB — TROPONIN T, HIGH SENSITIVITY: Troponin T High Sensitivity: <15 ng/L (ref 0–19)

## 2024-05-11 MED ORDER — IOHEXOL 350 MG/ML SOLN
75.0000 mL | Freq: Once | INTRAVENOUS | Status: AC | PRN
  Administered 2024-05-11: 75 mL via INTRAVENOUS

## 2024-05-11 NOTE — ED Triage Notes (Signed)
 Patient states woke up this morning with pain to jugular vein on right side. States this is the same symptoms she had with 3 prior heart attacks. Saw PCP this morning who sent her here due to slight EKG changes.

## 2024-05-11 NOTE — Discharge Instructions (Signed)
 Please call your cardiology office tomorrow to schedule a follow-up appointment for your visit to the ER.  Let them know you were seen in the emergency department.  If you have worsening pain, dizziness, shortness of breath, lightheadedness, or any other emergency concerns, please return to the hospital.

## 2024-05-11 NOTE — Progress Notes (Signed)
 Subjective: RR:Sara [name] chest pain PCP: Jolinda Norene HERO, DO YEP:Sara [name] is a 78 y.o. female presenting to clinic today for:  Atypical chest pain Patient reports that she had couple hours worth of right sided neck pain radiating to her jaw.  This is what her chest pain felt like when she had her last MRI.  She has history of 3 stents.  She has not contacted her cardiologist but came here after her exercise class.  She notes this pain woke her from sleep.  She is not having any limited movement of her neck and she is actually not having this pain anymore in her neck.  She denies any associated nausea, vomiting, diaphoresis or shortness of breath.  She is compliant with all medications as prescribed.  Last OV with cardiology was in May   ROS: Per HPI  Allergies  Allergen Reactions   Latex Other (See Comments)   Nifedipine Other (See Comments)    Brings blood pressure up really fast PROCARDIA   Tape Other (See Comments)    bleeding   Codeine Itching   Crestor  [Rosuvastatin ] Other (See Comments)    myalgias   Past Medical History:  Diagnosis Date   Arthritis    Arthritis -hands -Bil. 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 s/p Xience DES x 3 to LAD;  b. nuc study 02/27/10: EF 67% no ischemia;  c.  echo 11/12: Mild LVH, EF 55-65%, grade 1 diastolic dysfunction, moderate LAE;  d.  LHC 07/03/11: LAD stents patent, RCA 25%, EF 55-65% ;  e. Adeno. Myoview  5/14:  No ischemia, EF 68%   COVID-19 2021   Depression    Diverticular disease    Edema 02/28/2009   Qualifier: Diagnosis of   By: Cherrie, MD, CODY Toribio SAUNDERS        GERD (gastroesophageal reflux disease)    Hemorrhoid    Hepatitis    yellow jaundice as a child    HTN (hypertension)    Hyperlipidemia    Hypertension    Impaired hearing    bilateral- no hearing aids   Myocardial infarction (HCC)    hx of x 2  3-4 years ago per pt   Obesity    OSA (obstructive sleep apnea)    needs no cpap per patient pt denies   Other psoriasis    ongoing- none at present.   Vertigo    VERTIGO 12/09/2008   Qualifier: Diagnosis of   By: Rolan Hough          Current Outpatient Medications:    albuterol  (VENTOLIN  HFA) 108 (90 Base) MCG/ACT inhaler, TAKE 2 PUFFS BY MOUTH EVERY 6 HOURS AS NEEDED FOR WHEEZE OR SHORTNESS OF BREATH, Disp: 18 each, Rfl: 1   alendronate  (FOSAMAX ) 70 MG tablet, TAKE 1 TABLET EVERY 7 DAYS WITH A FULL GLASS OF WATER  ON AN EMPTY STOMACH, Disp: 12 tablet, Rfl: 0   aspirin  EC 81 MG tablet, Take 81 mg by mouth daily., Disp: , Rfl:    budesonide -formoterol  (SYMBICORT ) 80-4.5 MCG/ACT inhaler, Inhale 2 puffs into the lungs 2 (two) times daily., Disp: 1 each, Rfl: 3   Calcium  Carbonate-Vitamin D  (SUPER CALCIUM  600 + D3 PO), Take 1 tablet by mouth daily., Disp: , Rfl:    cyclobenzaprine  (FLEXERIL ) 5 MG tablet, TAKE 1 TABLET BY MOUTH THREE TIMES A DAY AS  NEEDED FOR MUSCLE SPASMS, Disp: 30 tablet, Rfl: 0   diltiazem  (CARDIZEM  CD) 120 MG 24 hr capsule, TAKE 1 CAPSULE BY MOUTH EVERY DAY, Disp: 90 capsule, Rfl: 3   Evolocumab  (REPATHA  SURECLICK) 140 MG/ML SOAJ, INJECT 140 MG INTO THE SKIN EVERY 14 (FOURTEEN) DAYS. CANCEL PRALEUNT RX, Disp: 6 mL, Rfl: 1   losartan  (COZAAR ) 25 MG tablet, TAKE 1 TABLET BY MOUTH TWICE A DAY, Disp: 180 tablet, Rfl: 1   meclizine  (ANTIVERT ) 25 MG tablet, Take 1 tablet (25 mg total) by mouth 2 (two) times daily as needed for dizziness. Reported on 12/05/2015, Disp: 30 tablet, Rfl: 1   mometasone  (NASONEX ) 50 MCG/ACT nasal spray, PLACE 2 SPRAYS INTO THE NOSE DAILY., Disp: 17 each, Rfl: 5   nitroGLYCERIN  (NITROSTAT ) 0.4 MG SL tablet, Place 1 tablet (0.4 mg total) under the tongue every 5 (five) minutes as needed for chest pain (x 3 doses). Reported on 12/05/2015, Disp: 25 tablet, Rfl: 1   omeprazole  (PRILOSEC) 20 MG capsule, Take 1 capsule (20 mg total) by mouth daily., Disp: 90  capsule, Rfl: 3   ondansetron  (ZOFRAN -ODT) 4 MG disintegrating tablet, Take 1 tablet (4 mg total) by mouth every 8 (eight) hours as needed for nausea or vomiting., Disp: 20 tablet, Rfl: 1   potassium chloride  (KLOR-CON ) 10 MEQ tablet, Take 1 tablet (10 mEq total) by mouth daily., Disp: 90 tablet, Rfl: 3   Semaglutide , 1 MG/DOSE, 4 MG/3ML SOPN, Inject 1 mg as directed once a week., Disp: 3 mL, Rfl: 0   spironolactone  (ALDACTONE ) 25 MG tablet, Take 0.5 tablets (12.5 mg total) by mouth 2 (two) times daily., Disp: 90 tablet, Rfl: 3   furosemide  (LASIX ) 20 MG tablet, Take 1 tablet (20 mg total) by mouth daily. (Patient not taking: Reported on 05/11/2024), Disp: 90 tablet, Rfl: 3 Social History   Socioeconomic History   Marital status: Married    Spouse name: Beryl   Number of children: 3   Years of education: Not on file   Highest education level: Not on file  Occupational History   Occupation: hairdresser  Tobacco Use   Smoking status: Never   Smokeless tobacco: Never  Vaping Use   Vaping status: Never Used  Substance and Sexual Activity   Alcohol  use: No   Drug use: No   Sexual activity: Not Currently    Birth control/protection: Post-menopausal  Other Topics Concern   Not on file  Social History Narrative   Lives with husband, Beryl.    1 son died in 06/01/2018 from ALS.    Social Drivers of Corporate investment banker Strain: Low Risk  (09/17/2023)   Overall Financial Resource Strain (CARDIA)    Difficulty of Paying Living Expenses: Not hard at all  Food Insecurity: No Food Insecurity (09/17/2023)   Hunger Vital Sign    Worried About Running Out of Food in the Last Year: Never true    Ran Out of Food in the Last Year: Never true  Transportation Needs: No Transportation Needs (09/17/2023)   PRAPARE - Administrator, Civil Service (Medical): No    Lack of Transportation (Non-Medical): No  Physical Activity: Insufficiently Active (09/17/2023)   Exercise Vital Sign    Days  of Exercise per Week: 3 days    Minutes of Exercise per Session: 30 min  Stress: No Stress Concern Present (09/17/2023)   [employer] of Occupational Health - Occupational Stress Questionnaire    Feeling of Stress : Not at all  Social Connections: Socially Integrated (09/17/2023)   Social Connection and Isolation Panel    Frequency of Communication with Friends and Family: More than three times a week    Frequency of Social Gatherings with Friends and Family: Three times a week    Attends Religious Services: More than 4 times per year    Active Member of Clubs or Organizations: Yes    Attends Banker Meetings: More than 4 times per year    Marital Status: Married  Catering manager Violence: Not At Risk (09/17/2023)   Humiliation, Afraid, Rape, and Kick questionnaire    Fear of Current or Ex-Partner: No    Emotionally Abused: No    Physically Abused: No    Sexually Abused: No   Family History  Problem Relation Age of Onset   Stroke Mother    Hypertension Mother    Heart failure Mother    Breast cancer Mother    Aneurysm Father    Seizures Brother    Muscular dystrophy Daughter        dx as infant, passed age 22   Seizures Son    ALS Son    Colon cancer Neg Hx    Esophageal cancer Neg Hx    Rectal cancer Neg Hx    Stomach cancer Neg Hx     Objective: Office vital signs reviewed. BP 132/70   Pulse 85   Temp 98.3 F (36.8 C) (Temporal)   SpO2 97%   Physical Examination:  General: Awake, alert, morbidly obese, No acute distress HEENT: sclera white, MMM Cardio: regular rate and rhythm, S1S2 heard, no murmurs appreciated Pulm: clear to auscultation bilaterally, no wheezes, rhonchi or rales; normal work of breathing on room air MSK: Range of motion of neck is preserved    Assessment/ Plan: 78 y.o. female   Other chest pain - Plan: EKG 12-Lead   Vital signs are within normal range.  Her EKG looks relatively stable from her checkup in May though her V  leads do show some elevation in that ST segment that may be slightly more pronounced on today's EKG.  I think that she would benefit from at minimum delta troponin given her very extensive cardiac history and advised her to at minimum get this done.  She was amenable to this and her husband will transport her to the nearest ER to get blood work done and ensure that she is safe to go out of town tomorrow.   Norene CHRISTELLA Fielding, DO [facility] [phone]

## 2024-05-11 NOTE — ED Provider Triage Note (Signed)
 Emergency Medicine Provider Triage Evaluation Note  Sara [name] , a 78 y.o. female  was evaluated in triage.  Pt complains of severe neck pain since this morning. Same sx as past MI.   Review of Systems  Positive: Neck pain Negative: SHOB, CP, Dizziness  Physical Exam  BP 125/81 (BP Location: Right Arm)   Pulse 81   Temp 98.4 F (36.9 C) (Oral)   Resp 18   SpO2 96%  Gen:   Awake, no distress   Resp:  Normal effort  MSK:   Moves extremities without difficulty  Other:  Severe neck pain 10/10, this am, hx MI of same, no bruits noted.   Medical Decision Making  Medically screening exam initiated at 3:05 PM.  Appropriate orders placed.  Sara [name] was informed that the remainder of the evaluation will be completed by another provider, this initial triage assessment does not replace that evaluation, and the importance of remaining in the ED until their evaluation is complete.     Sara [name], [place] 05/11/24 1511

## 2024-05-11 NOTE — ED Notes (Signed)
 Reviewed discharge instructions and follow-up care with pt. Pt verbalized understanding and had no further questions. Pt exited ED without complications.

## 2024-05-11 NOTE — ED Notes (Signed)
 ED Provider at bedside.

## 2024-05-11 NOTE — ED Provider Notes (Signed)
  EMERGENCY DEPARTMENT AT [facility] Provider Note   CSN: [id number] Arrival date & time: 05/11/24  1442     Patient presents with: Neck Pain   Sara [name] is a 78 y.o. female presented ED complaining of pain and palpitations in the right anterior side of her neck.  She says she woke up this morning and it began while she was at rest.  She said it felt reminiscent of when she had an MI in the past.  She says it was painful this morning but the pain has gone away and now she just feels a funny feeling in the right side of her neck.  She denies shortness of breath, lightheadedness, dyspnea on exertion.   HPI     Prior to Admission medications   Medication Sig Start Date End Date Taking? Authorizing Provider  albuterol  (VENTOLIN  HFA) 108 (90 Base) MCG/ACT inhaler TAKE 2 PUFFS BY MOUTH EVERY 6 HOURS AS NEEDED FOR WHEEZE OR SHORTNESS OF BREATH 12/05/20   Jolinda Potter M, DO  alendronate  (FOSAMAX ) 70 MG tablet TAKE 1 TABLET EVERY 7 DAYS WITH A FULL GLASS OF WATER  ON AN EMPTY STOMACH 05/06/24   Jolinda Potter M, DO  aspirin  EC 81 MG tablet Take 81 mg by mouth daily.    [provider]  budesonide -formoterol  (SYMBICORT ) 80-4.5 MCG/ACT inhaler Inhale 2 puffs into the lungs 2 (two) times daily. 08/23/20   Jolinda Potter HERO, DO  Calcium  Carbonate-Vitamin D  (SUPER CALCIUM  600 + D3 PO) Take 1 tablet by mouth daily.    [provider]  cyclobenzaprine  (FLEXERIL ) 5 MG tablet TAKE 1 TABLET BY MOUTH THREE TIMES A DAY AS NEEDED FOR MUSCLE SPASMS 06/20/18   Lavell Lye A, FNP  diltiazem  (CARDIZEM  CD) 120 MG 24 hr capsule TAKE 1 CAPSULE BY MOUTH EVERY DAY 05/20/23   Jolinda Potter M, DO  Evolocumab  (REPATHA  SURECLICK) 140 MG/ML SOAJ INJECT 140 MG INTO THE SKIN EVERY 14 (FOURTEEN) DAYS. CANCEL PRALEUNT RX 02/26/24   Jolinda Potter M, DO  furosemide  (LASIX ) 20 MG tablet Take 1 tablet (20 mg total) by mouth daily. Patient not taking: Reported on  05/11/2024 05/20/23   Jolinda Potter HERO, DO  losartan  (COZAAR ) 25 MG tablet TAKE 1 TABLET BY MOUTH TWICE A DAY 01/20/24   Jolinda Potter M, DO  meclizine  (ANTIVERT ) 25 MG tablet Take 1 tablet (25 mg total) by mouth 2 (two) times daily as needed for dizziness. Reported on 12/05/2015 06/20/18   Lavell Lye LABOR, FNP  mometasone  (NASONEX ) 50 MCG/ACT nasal spray PLACE 2 SPRAYS INTO THE NOSE DAILY. 07/18/23   Jolinda Potter HERO, DO  nitroGLYCERIN  (NITROSTAT ) 0.4 MG SL tablet Place 1 tablet (0.4 mg total) under the tongue every 5 (five) minutes as needed for chest pain (x 3 doses). Reported on 12/05/2015 08/23/20   Jolinda Potter HERO, DO  omeprazole  (PRILOSEC) 20 MG capsule Take 1 capsule (20 mg total) by mouth daily. 05/20/23   Jolinda Potter HERO, DO  ondansetron  (ZOFRAN -ODT) 4 MG disintegrating tablet Take 1 tablet (4 mg total) by mouth every 8 (eight) hours as needed for nausea or vomiting. 09/24/23   Jolinda Potter HERO, DO  potassium chloride  (KLOR-CON ) 10 MEQ tablet Take 1 tablet (10 mEq total) by mouth daily. 05/20/23   Jolinda Potter HERO, DO  Semaglutide , 1 MG/DOSE, 4 MG/3ML SOPN Inject 1 mg as directed once a week. 06/25/22   Jolinda Potter HERO, DO  spironolactone  (ALDACTONE ) 25 MG tablet Take 0.5 tablets (12.5 mg total) by  mouth 2 (two) times daily. 05/20/23   Jolinda Norene HERO, DO    Allergies: Latex, Nifedipine, Tape, Codeine, and Crestor  [rosuvastatin ]    Review of Systems  Updated Vital Signs BP 125/81 (BP Location: Right Arm)   Pulse 81   Temp 98.4 F (36.9 C) (Oral)   Resp 18   SpO2 96%   Physical Exam Constitutional:      General: She is not in acute distress. HENT:     Head: Normocephalic and atraumatic.  Eyes:     Conjunctiva/sclera: Conjunctivae normal.     Pupils: Pupils are equal, round, and reactive to light.  Cardiovascular:     Rate and Rhythm: Normal rate and regular rhythm.  Pulmonary:     Effort: Pulmonary effort is normal. No respiratory distress.   Abdominal:     General: There is no distension.     Tenderness: There is no abdominal tenderness.  Skin:    General: Skin is warm and dry.  Neurological:     General: No focal deficit present.     Mental Status: She is alert and oriented to person, place, and time. Mental status is at baseline.  Psychiatric:        Mood and Affect: Mood normal.        Behavior: Behavior normal.     (all labs ordered are listed, but only abnormal results are displayed) Labs Reviewed  BASIC METABOLIC PANEL WITH GFR - Abnormal; Notable for the following components:      Result Value   Glucose, Bld 129 (*)    All other components within normal limits  CBC  TROPONIN T, HIGH SENSITIVITY    EKG: EKG Interpretation Date/Time:  Monday May 11 2024 14:56:46 EDT Ventricular Rate:  81 PR Interval:  178 QRS Duration:  140 QT Interval:  386 QTC Calculation: 448 R Axis:   -20  Text Interpretation: Normal sinus rhythm Left ventricular hypertrophy with QRS widening and repolarization abnormality ( R in aVL , Cornell product ) When compared with ECG of 05-Dec-2023 11:19, No significant changes from prior tracing Confirmed by Cottie Cough [phone]) on 05/11/2024 3:52:05 PM  Radiology: CT ANGIO HEAD NECK W WO CM Result Date: 05/11/2024 EXAM: CTA HEAD AND NECK WITH AND WITHOUT 05/11/2024 06:38:05 PM TECHNIQUE: CTA of the head and neck was performed with and without the administration of intravenous contrast. 75 mL of iohexol  (OMNIPAQUE ) 350 MG/ML injection was administered. Multiplanar 2D and/or 3D reformatted images are provided for review. Automated exposure control, iterative reconstruction, and/or weight based adjustment of the mA/kV was utilized to reduce the radiation dose to as low as reasonably achievable. Stenosis of the internal carotid arteries measured using NASCET criteria. COMPARISON: None available CLINICAL HISTORY: Pulsatile tinnitus, evaluation for vertebral injury. Patient states woke up  this morning with pain to jugular vein on right side. States this is the same symptoms she had with 3 prior heart attacks. Saw PCP this morning who sent her here due to slight EKG changes. FINDINGS: CTA NECK: AORTIC ARCH AND ARCH VESSELS: Calcific atherosclerosis of the aortic arch. No dissection or arterial injury. No significant stenosis of the brachiocephalic or subclavian arteries. CERVICAL CAROTID ARTERIES: No dissection, arterial injury, or hemodynamically significant stenosis by NASCET criteria. CERVICAL VERTEBRAL ARTERIES: No dissection, arterial injury, or significant stenosis. LUNGS AND MEDIASTINUM: Unremarkable. SOFT TISSUES: No acute abnormality. BONES: No acute abnormality. CTA HEAD: ANTERIOR CIRCULATION: Atherosclerotic calcification along the cavernous segments of both internal carotid arteries without hemodynamically significant stenosis. No significant stenosis  of the anterior cerebral arteries. No significant stenosis of the middle cerebral arteries. No aneurysm. POSTERIOR CIRCULATION: No significant stenosis of the posterior cerebral arteries. No significant stenosis of the basilar artery. No significant stenosis of the vertebral arteries. No aneurysm. BRAIN: Old left cerebellar infarct. Chronic ischemic white matter changes. Mild volume loss. No dural venous sinus thrombosis on this non-dedicated study. IMPRESSION: 1. No large vessel occlusion, hemodynamically significant stenosis, or aneurysm in the head or neck. 2. Atherosclerotic calcification along the cavernous segments of both internal carotid arteries without hemodynamically significant stenosis. 3. 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 Electronically signed by: Franky Stanford MD 05/11/2024 07:01 PM EDT RP Workstation: [id number]   DG Chest 2 View Result Date: 05/11/2024 EXAM: 2 VIEW(S) XRAY OF THE CHEST 05/11/2024 03:44:00 PM COMPARISON: 06/02/2013 CLINICAL HISTORY: chest pain. States this is the same  symptoms she had with 3 prior heart attacks. Saw PCP this morning who sent her here due to slight EKG changes. FINDINGS: LUNGS AND PLEURA: Asymmetric elevation of the right hemidiaphragm. Subtle peripheral linear opacities in the left base which may reflect atelectasis or chronic scar. No pulmonary edema. No pleural effusion. No pneumothorax. HEART AND MEDIASTINUM: No acute abnormality of the cardiac and mediastinal silhouettes. BONES AND SOFT TISSUES: No acute osseous abnormality. IMPRESSION: 1. No acute cardiopulmonary findings. Electronically signed by: Waddell Calk MD 05/11/2024 04:13 PM EDT RP Workstation: [id number]     Procedures   Medications Ordered in the ED  iohexol  (OMNIPAQUE ) 350 MG/ML injection 75 mL (75 mLs Intravenous Contrast Given 05/11/24 1826)                                    Medical Decision Making Amount and/or Complexity of Data Reviewed Labs: ordered. Radiology: ordered.  Risk Prescription drug management.   Patient is here complaining of anterior right sided neck pain.  Differential would include vasospasm versus atypical ACS versus musculoskeletal pain versus vertebral or vascular injury versus other  I personally reviewed interpret the patient's workup and imaging.  There were no emergent findings.  Her troponin is undetectable after 8 hours since symptom onset.  Per my review her EKG shows a stable left bundle branch block with no acute ischemic findings.  She is not having any active pain or discomfort at this time.  I did review her prior cardiology office records and note that she has had multiple stents in the past for coronary disease.  She has high risk for cardiovascular disease.  We discussed the options of hospitalization but she does not want to stay in the hospital at this time.  And says she would prefer to follow-up as an outpatient with her cardiologist.  I do think this is a reasonable option.  She does not want to stay for second troponin,  which again I think is reasonable since it has been over 6 hours from the onset of her initial symptoms, and her initial troponin is undetectable, per current a subclinical guidelines.  I have a low suspicion for aortic dissection, pulmonary embolism.  Her CT angiogram does not show any emergent pathology or vertebral or vascular dissection, but does know she has atherosclerotic disease.   She will call her cardiologist tomorrow      Final diagnoses:  Neck pain    ED Discharge Orders     None          Cottie Cough  J, MD 05/11/24 [id number]

## 2024-05-12 ENCOUNTER — Telehealth: Payer: Self-pay

## 2024-05-12 NOTE — Telephone Encounter (Signed)
 Patient called wanting to know if Mliss or Lavern was going to help her fill out her patient assistance forms this year for her Ozempic .  Please advise.

## 2024-05-14 NOTE — Telephone Encounter (Signed)
 R/C to [facility]

## 2024-05-14 NOTE — Telephone Encounter (Signed)
 Pt returned call.  Discussed [employer] changes for 2026.  Completed LIS application over the phone with patient.   Patient will reach back out once decision letter is rec'd.

## 2024-05-14 NOTE — Telephone Encounter (Signed)
 Spoke with patient but she couldn't talk at the moment. Says she will call the office back around 1p to speak with me.

## 2024-05-15 ENCOUNTER — Other Ambulatory Visit: Payer: Self-pay | Admitting: Family Medicine

## 2024-05-15 DIAGNOSIS — I152 Hypertension secondary to endocrine disorders: Secondary | ICD-10-CM

## 2024-05-17 DIAGNOSIS — M542 Cervicalgia: Secondary | ICD-10-CM | POA: Insufficient documentation

## 2024-05-17 NOTE — Progress Notes (Unsigned)
 Cardiology Office Note:   Date:  05/18/2024  ID:  Sara [name], DOB 05/01/1946, MRN [id number] PCP: Jolinda Norene HERO, DO  [facility] Providers Cardiologist:  None {  History of Present Illness:   Sara [name] is a 78 y.o. female who presents for follow up of CAD and AVNRT.  She had PCI to the LAD in 10/10.  In 12/12, she was admitted to [facility] with SVT and associated chest pain and elevated troponin.  Cath in 12/12 showed patent LAD stents. She had a right and a left heart cath in 2015 for dyspnea.  She had patent arteries.  She had normal right heart pressures.    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  She had Adenosine  Cardiolite  in 08/09/16  with no evidence for ischemia or infarction.      Since I last saw her she was in the ED last week with neck pain.  I reviewed these records for this visit.    There was no evidence of ischemia.  She said that she went to the ED because her jugular vein was just pulsating.  She was not having any particular tachypalpitations.  She was not otherwise feeling poorly.  Coincidently she passed out a couple of days later while standing in her shower.  She thought the water  was too hot.  She felt things going great.  She then fell hitting her head and shoulder slightly.  She was not feeling any tachypalpitations.  She had not been feeling any orthostatic symptoms.  She has not been having any chest pressure, neck or arm discomfort.  Has had no weight gain or edema.  She did not go to the emergency room.    ROS: As stated in the HPI and negative for all other systems.  Studies Reviewed:    EKG:   EKG Interpretation Date/Time:  Monday May 18 2024 12:30:55 EDT Ventricular Rate:  73 PR Interval:  190 QRS Duration:  142 QT Interval:  404 QTC Calculation: 445 R Axis:   -15  Text Interpretation: Normal sinus rhythm with sinus arrhythmia Left bundle branch block When compared with ECG of 11-May-2024 14:56, No significant change since  last tracing Confirmed by Lavona Agent ([id number]) on 05/18/2024 12:35:45 PM   Risk Assessment/Calculations:       Physical Exam:   VS:  BP (!) 114/56 (BP Location: Left Arm, Patient Position: Sitting, Cuff Size: Large)   Pulse 68   Ht 5' 4 (1.626 m)   Wt 213 lb (96.6 kg)   SpO2 97%   BMI 36.56 kg/m    Wt Readings from Last 3 Encounters:  05/18/24 213 lb (96.6 kg)  12/23/23 215 lb (97.5 kg)  12/05/23 212 lb (96.2 kg)     GEN: Well nourished, well developed in no acute distress NECK: No JVD; No carotid bruits CARDIAC: RRR, no murmurs, rubs, gallops RESPIRATORY:  Clear to auscultation without rales, wheezing or rhonchi  ABDOMEN: Soft, non-tender, non-distended EXTREMITIES:  No edema; No deformity   ASSESSMENT AND PLAN:   CAD:   She is not having any anginal symptoms.  At this point I am not strongly suspecting an ischemic etiology for either her ER presentation or syncope.  She will continue with risk reduction.   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    IVCD:   I will apply a 2-week monitor to evaluate this as well as her syncope.  DYSLIPIDEMIA: Her LDL is 72 in October of last year.  No change in therapy.    HTN:   Her BP is at target.  SYNCOPE: Today in the office she was  not orthostatic.  I do suspect she had a vagal episode but I am going to have her wear a 2-week monitor as above.  We talked about not driving for 6 months     Follow up with APP in 3 months.   Signed, Lynwood Schilling, MD

## 2024-05-18 ENCOUNTER — Ambulatory Visit: Attending: Cardiology | Admitting: Cardiology

## 2024-05-18 ENCOUNTER — Encounter: Payer: Self-pay | Admitting: Cardiology

## 2024-05-18 ENCOUNTER — Ambulatory Visit: Attending: Cardiology

## 2024-05-18 VITALS — BP 114/56 | HR 68 | Ht 64.0 in | Wt 213.0 lb

## 2024-05-18 DIAGNOSIS — I4719 Other supraventricular tachycardia: Secondary | ICD-10-CM

## 2024-05-18 DIAGNOSIS — E785 Hyperlipidemia, unspecified: Secondary | ICD-10-CM | POA: Diagnosis not present

## 2024-05-18 DIAGNOSIS — I10 Essential (primary) hypertension: Secondary | ICD-10-CM | POA: Diagnosis not present

## 2024-05-18 DIAGNOSIS — R55 Syncope and collapse: Secondary | ICD-10-CM

## 2024-05-18 DIAGNOSIS — M542 Cervicalgia: Secondary | ICD-10-CM

## 2024-05-18 DIAGNOSIS — I2510 Atherosclerotic heart disease of native coronary artery without angina pectoris: Secondary | ICD-10-CM

## 2024-05-18 NOTE — Patient Instructions (Signed)
 Medication Instructions:  Your physician recommends that you continue on your current medications as directed. Please refer to the Current Medication list given to you today.  *If you need a refill on your cardiac medications before your next appointment, please call your pharmacy*  Lab Work: NONE If you have labs (blood work) drawn today and your tests are completely normal, you will receive your results only by: MyChart Message (if you have MyChart) OR A paper copy in the mail If you have any lab test that is abnormal or we need to change your treatment, we will call you to review the results.  Testing/Procedures: 14 Day Zio Heart Monitor Your physician has requested that you wear a Zio heart monitor for _14____ days. This will be mailed to your home with instructions on how to apply the monitor and how to return it when finished. Please allow 2 weeks after returning the heart monitor before our office calls you with the results.   Follow-Up: At [facility], you and your health needs are our priority.  As part of our continuing mission to provide you with exceptional heart care, our providers are all part of one team.  This team includes your primary Cardiologist (physician) and Advanced Practice Providers or APPs (Physician Assistants and Nurse Practitioners) who all work together to provide you with the care you need, when you need it.  Your next appointment:   3 months  Provider:   APP  We recommend signing up for the patient portal called MyChart.  Sign up information is provided on this After Visit Summary.  MyChart is used to connect with patients for Virtual Visits (Telemedicine).  Patients are able to view lab/test results, encounter notes, upcoming appointments, etc.  Non-urgent messages can be sent to your provider as well.   To learn more about what you can do with MyChart, go to [email or web address].   Other Instructions ZIO XT- Long Term Monitor  Instructions  Your physician has requested you wear a ZIO patch monitor for 14 days.  This is a single patch monitor. [employer] supplies one patch monitor per enrollment. Additional stickers are not available. Please do not apply patch if you will be having a Nuclear Stress Test,  Echocardiogram, Cardiac CT, MRI, or Chest Xray during the period you would be wearing the  monitor. The patch cannot be worn during these tests. You cannot remove and re-apply the  ZIO XT patch monitor.  Your ZIO patch monitor will be mailed 3 day USPS to your address on file. It may take 3-5 days  to receive your monitor after you have been enrolled.  Once you have received your monitor, please review the enclosed instructions. Your monitor  has already been registered assigning a specific monitor serial # to you.  Billing and Patient Assistance Program Information  We have supplied [employer] with any of your insurance information on file for billing purposes. [employer] offers a sliding scale Patient Assistance Program for patients that do not have  insurance, or whose insurance does not completely cover the cost of the ZIO monitor.  You must apply for the Patient Assistance Program to qualify for this discounted rate.  To apply, please call [employer] at [phone], select option 4, select option 2, ask to apply for  Patient Assistance Program. Meredeth will ask your household income, and how many people  are in your household. They will quote your out-of-pocket cost based on that information.  [employer] will also be able to set up  a 50-month, interest-free payment plan if needed.  Applying the monitor   Shave hair from upper left chest.  Hold abrader disc by orange tab. Rub abrader in 40 strokes over the upper left chest as  indicated in your monitor instructions.  Clean area with 4 enclosed alcohol  pads. Let dry.  Apply patch as indicated in monitor instructions. Patch will be placed under collarbone on left  side  of chest with arrow pointing upward.  Rub patch adhesive wings for 2 minutes. Remove white label marked 1. Remove the white  label marked 2. Rub patch adhesive wings for 2 additional minutes.  While looking in a mirror, press and release button in center of patch. A small green light will  flash 3-4 times. This will be your only indicator that the monitor has been turned on.  Do not shower for the first 24 hours. You may shower after the first 24 hours.  Press the button if you feel a symptom. You will hear a small click. Record Date, Time and  Symptom in the Patient Logbook.  When you are ready to remove the patch, follow instructions on the last 2 pages of Patient  Logbook. Stick patch monitor onto the last page of Patient Logbook.  Place Patient Logbook in the blue and white box. Use locking tab on box and tape box closed  securely. The blue and white box has prepaid postage on it. Please place it in the mailbox as  soon as possible. Your physician should have your test results approximately 7 days after the  monitor has been mailed back to [facility]  Call [facility] Customer Care at [phone] if you have questions regarding  your ZIO XT patch monitor. Call them immediately if you see an orange light blinking on your  monitor.  If your monitor falls off in less than 4 days, contact our Monitor department at [phone].  If your monitor becomes loose or falls off after 4 days call [employer] at [phone] for  suggestions on securing your monitor

## 2024-05-18 NOTE — Progress Notes (Unsigned)
 Enrolled for [employer] to mail a ZIO XT long term holter monitor to the patients address on file.

## 2024-05-19 ENCOUNTER — Other Ambulatory Visit: Payer: Self-pay | Admitting: Family Medicine

## 2024-05-19 ENCOUNTER — Telehealth: Payer: Self-pay | Admitting: Family Medicine

## 2024-05-19 DIAGNOSIS — M810 Age-related osteoporosis without current pathological fracture: Secondary | ICD-10-CM

## 2024-05-19 NOTE — Telephone Encounter (Signed)
 Copied from CRM [id number]. Topic: Appointments - Appointment Scheduling >> May 19, 2024  3:03 PM Emylou G wrote: Patient called ( specifically wants Mliss to call her ) in regards to her insurance Pt went to pick up Repatha  at her pharmacy she was told that the prescription had ran out pt states she is on a grant for this, pharmacy told her that she would have to pay $103.00 Pt is also on a grant for ozempic , she was told that this was going to end she can not afford this wants to speak Mliss

## 2024-05-25 DIAGNOSIS — H52203 Unspecified astigmatism, bilateral: Secondary | ICD-10-CM | POA: Diagnosis not present

## 2024-05-25 DIAGNOSIS — Z961 Presence of intraocular lens: Secondary | ICD-10-CM | POA: Diagnosis not present

## 2024-05-25 DIAGNOSIS — E119 Type 2 diabetes mellitus without complications: Secondary | ICD-10-CM | POA: Diagnosis not present

## 2024-05-25 DIAGNOSIS — H04123 Dry eye syndrome of bilateral lacrimal glands: Secondary | ICD-10-CM | POA: Diagnosis not present

## 2024-05-25 LAB — OPHTHALMOLOGY REPORT-SCANNED

## 2024-05-26 ENCOUNTER — Other Ambulatory Visit: Payer: Self-pay | Admitting: Family Medicine

## 2024-05-26 DIAGNOSIS — M810 Age-related osteoporosis without current pathological fracture: Secondary | ICD-10-CM

## 2024-05-26 NOTE — Telephone Encounter (Unsigned)
 Copied from CRM #[id number]. Topic: Clinical - Medication Refill >> May 26, 2024  3:20 PM Wess RAMAN wrote: Medication: alendronate  (FOSAMAX ) 70 MG tablet   Has the patient contacted their pharmacy? Yes (Agent: If no, request that the patient contact the pharmacy for the refill. If patient does not wish to contact the pharmacy document the reason why and proceed with request.) (Agent: If yes, when and what did the pharmacy advise?)  This is the patient's preferred pharmacy:  [id number] - [place], [place] - [address] [employer] [place] [address] Phone: [phone] Fax: [phone]  Is this the correct pharmacy for this prescription? Yes If no, delete pharmacy and type the correct one.   Has the prescription been filled recently? Yes  Is the patient out of the medication? Yes  Has the patient been seen for an appointment in the last year OR does the patient have an upcoming appointment? Yes  Can we respond through MyChart? No  Agent: Please be advised that Rx refills may take up to 3 business days. We ask that you follow-up with your pharmacy.

## 2024-05-27 NOTE — Telephone Encounter (Signed)
This was sent in 3 weeks ago

## 2024-05-29 ENCOUNTER — Other Ambulatory Visit: Payer: Self-pay | Admitting: *Deleted

## 2024-05-29 DIAGNOSIS — M810 Age-related osteoporosis without current pathological fracture: Secondary | ICD-10-CM

## 2024-06-02 ENCOUNTER — Telehealth: Payer: Self-pay

## 2024-06-02 DIAGNOSIS — M810 Age-related osteoporosis without current pathological fracture: Secondary | ICD-10-CM

## 2024-06-02 NOTE — Telephone Encounter (Signed)
 Copied from CRM #[id number]. Topic: Clinical - Prescription Issue >> May 28, 2024 11:19 AM Nathanel BROCKS wrote: Reason for CRM: alendronate  (FOSAMAX ) 70 MG tablet  Pt called for the status of this rx. It was sent in 3 weeks ago on 10/15. Pt is going to check on it. The office said she will need an office visit before filling next time. >> Jun 02, 2024  2:05 PM Mia F wrote: Pt is calling regaurds to the medication FOSAMAX . Pt is aware that it was sent to the pharmacy on 10/15 but the pharmacy says they never recieved it. Pt has been without the medication for 3 weeks. Please call pt back

## 2024-06-03 NOTE — Telephone Encounter (Signed)
 Refills are available x1 year but unlikely her insurance will cover it since it shows picked up already. She'll likely have to either see if pharmacy can get an insurance override for lost rx or she will have to pay out of pocket for them.

## 2024-06-03 NOTE — Telephone Encounter (Signed)
 Patient not home, will call back after 2 when she should be home.

## 2024-06-09 MED ORDER — ALENDRONATE SODIUM 70 MG PO TABS: ORAL_TABLET | ORAL | 4 refills | Status: AC

## 2024-06-09 NOTE — Addendum Note (Signed)
 Addended by: JOLINDA NORENE HERO on: 06/09/2024 02:09 PM   Modules accepted: Orders

## 2024-06-09 NOTE — Telephone Encounter (Signed)
 Patient is going to use a goodRx card.  $14.32 at [facility]. Added pharmacy to her list. Please send a new script to [facility] [place].

## 2024-06-12 ENCOUNTER — Other Ambulatory Visit

## 2024-06-12 NOTE — Progress Notes (Unsigned)
 06/16/2024 Name: Sara [name] MRN: [id number] DOB: Apr 23, 1946  Chief Complaint  Patient presents with   Diabetes   Hypertension   Hyperlipidemia   Medication Assistance    Sara [name] is a 78 y.o. year old female who was referred for medication management by their primary care provider, Jolinda Potter M, DO. They presented for a face to face visit today. They were referred to the pharmacist by their PCP for assistance in managing diabetes, hyperlipidemia/cardiovascular risk reduction, and medication access    Subjective: Sara [name] presents for her in-person appointment for medication access, diabetes, and hyperlipidemia follow-up. Patient brought in paperwork stating that she qualifies for [employer], LIS application completed last month. Letter received in mail stating she was accepted for LIS. She is due for [employer] grant renewal, will receive assistance through LIS for medication. She is out of medication at this time and will provide a sample today. Counseled on the loss of patient assistance for Ozempic .   She reports a syncope episode last month. She did go to the emergency room and had follow-up from cardiology. MI was ruled out due to undetectable troponin; they considered the episode to be caused by a vasovagal response. Encouraged her to drink plenty of water  throughout the day. Currently she is only drinking 2-3 glasses daily; it is recommended to increase intake to 4-5.   She is due for labs but has a PCP appointment next week. Will obtain labs then. Counseled on fasting prior to appointment.   Reports running out of alendronate  for ~1 month. She recently picked up a prescription on 11/18 and took her first dose last Wednesday.   Care Team: Primary Care Provider: Jolinda Potter HERO, DO ; Next Scheduled Visit: 06/23/24  Medication Access/Adherence Current Pharmacy:  [phone] [place] [place] [address] [place]  [place] [address] Phone: [phone] Fax: [phone]  [facility] [address] [place] - [address] [place] [address] Phone: [phone] Fax: [phone]   Patient reports affordability concerns with their medications: Yes  - Novo Nordisk PAP for Ozempic , recent approval for LIS Patient reports access/transportation concerns to their pharmacy: No  Patient reports adherence concerns with their medications:  No    Diabetes: Current medications: Ozempic  1 mg weekly Medications tried in the past: metformin Statin: Repatha  LDL of 72 on 05/20/23 ACEi/ARB: losartan  UACR of <9 on 12/23/23  A1c of 5.2% on 12/23/23  Current glucose readings: not checking sugars at home   Patient denies hypoglycemic s/sx including dizziness, shakiness, sweating. Patient denies hyperglycemic symptoms including polyuria, polydipsia, polyphagia, nocturia, neuropathy, blurred vision.  Current meal patterns: Discussed meal planning options and Plate method for healthy eating Avoid sugary drinks and desserts Incorporate balanced protein, non starchy veggies, 1 serving of carbohydrate with each meal Increase water  intake Increase physical activity as able    Hyperlipidemia/ASCVD Risk Reduction Current lipid lowering medications: Repatha  140 mg every 2 weeks Medications tried in the past: atorvastatin , rosuvastatin  (myalgias) Antiplatelet regimen: aspirin  81 mg ASCVD History: history of MI  Current medication access support:  [employer] LIS   Objective: Lab Results  Component Value Date   HGBA1C 5.2 12/23/2023   Lab Results  Component Value Date   CREATININE 0.75 05/11/2024   BUN 12 05/11/2024   NA 137 05/11/2024   K 4.3 05/11/2024   CL 104 05/11/2024   CO2 24 05/11/2024   Lab Results  Component Value Date   CHOL  143 05/20/2023   HDL 54 05/20/2023   LDLCALC 72 05/20/2023   TRIG 88 05/20/2023   CHOLHDL 2.6 05/20/2023   Medications Reviewed Today      Reviewed by Bernette Falling, [facility] (Pharmacist) on 06/16/24 at 1029  Med List Status: <None>   Medication Order Taking? Sig Documenting Provider Last Dose Status Informant  albuterol  (VENTOLIN  HFA) 108 (90 Base) MCG/ACT inhaler [id number]  TAKE 2 PUFFS BY MOUTH EVERY 6 HOURS AS NEEDED FOR WHEEZE OR SHORTNESS OF BREATH  Patient not taking: Reported on 05/18/2024   Jolinda Norene HERO, DO  Active   alendronate  (FOSAMAX ) 70 MG tablet [id number] Yes TAKE 1 TABLET EVERY 7 DAYS WITH A FULL GLASS OF WATER  ON AN EMPTY STOMACH Jolinda Norene M, DO  Active   aspirin  EC 81 MG tablet [id number] Yes Take 81 mg by mouth daily. [provider]  Active Self  budesonide -formoterol  (SYMBICORT ) 80-4.5 MCG/ACT inhaler [id number]  Inhale 2 puffs into the lungs 2 (two) times daily. Jolinda Norene HERO, DO  Active Self           Med Note SOUNDRA BOBETTA JINNY Pablo May 18, 2024 11:43 AM) Takes as needed  Calcium  Carbonate-Vitamin D  (SUPER CALCIUM  600 + D3 PO) [id number]  Take 1 tablet by mouth daily. [provider]  Active Self   Patient not taking:   Discontinued 06/16/24 1028 (Completed Course)            Med Note SOUNDRA BOBETTA JINNY Pablo May 18, 2024 11:43 AM) Has not needed  diltiazem  (CARDIZEM  CD) 120 MG 24 hr capsule [id number] Yes TAKE 1 CAPSULE BY MOUTH EVERY DAY Jolinda Norene M, DO  Active   Evolocumab  (REPATHA  SURECLICK) 140 MG/ML SOAJ [id number] Yes INJECT 140 MG INTO THE SKIN EVERY 14 (FOURTEEN) DAYS. CANCEL PRALEUNT RX Jolinda Norene M, DO  Active   furosemide  (LASIX ) 20 MG tablet [id number] Yes Take 1 tablet (20 mg total) by mouth daily. Jolinda Norene M, DO  Active   losartan  (COZAAR ) 25 MG tablet [id number] Yes TAKE 1 TABLET BY MOUTH TWICE A DAY Jolinda Norene HERO, [place]  Active    Patient not taking:   Discontinued 06/16/24 1028 (Patient Preference)          Med Note SOUNDRA, EBONY J   Mon May 18, 2024 11:44 AM) Has not needed  mometasone  (NASONEX ) 50 MCG/ACT nasal spray [id number]   PLACE 2 SPRAYS INTO THE NOSE DAILY. Jolinda Norene M, DO  Active   nitroGLYCERIN  (NITROSTAT ) 0.4 MG SL tablet [id number]  Place 1 tablet (0.4 mg total) under the tongue every 5 (five) minutes as needed for chest pain (x 3 doses). Reported on 12/05/2015  Patient not taking: Reported on 05/18/2024   Jolinda Norene HERO, DO  Active            Med Note SOUNDRA BOBETTA JINNY Pablo May 18, 2024 11:45 AM) Never used need refill  omeprazole  (PRILOSEC) 20 MG capsule [id number]  TAKE 1 CAPSULE BY MOUTH EVERY DAY Jolinda Norene HERO, DO  Active    Patient not taking:   Discontinued 06/16/24 1029 (Patient Preference)            Med Note SOUNDRA, EBONY J   Mon May 18, 2024 11:45 AM) Has not needed  potassium chloride  (KLOR-CON ) 10 MEQ tablet [id number]  TAKE 1 TABLET BY MOUTH EVERY DAY Jolinda Norene HERO, DO  Active   Semaglutide , 1 MG/DOSE, 4 MG/3ML SOPN [id number] Yes Inject 1  mg as directed once a week. Jolinda Norene HERO, DO  Active            Med Note TENA, JULIE D   Thu Aug 15, 2023 10:32 AM) Via [employer] patient assistance program    spironolactone  (ALDACTONE ) 25 MG tablet [id number] Yes Take 0.5 tablets (12.5 mg total) by mouth 2 (two) times daily. Jolinda Norene M, DO  Active             Assessment/Plan:  Diabetes: Currently controlled. Goal of <7% Cardiorenal risk reduction is opportunities for improvement.  Blood pressure is at goal of <130/80 mmHg Today's BP of 116/70 LDL is not at goal of <55 mg/dL Reviewed long term cardiovascular and renal outcomes of uncontrolled blood sugar Reviewed goal A1c, goal fasting, and goal 2 hour post prandial glucose Reviewed dietary modifications, encouraged to increase hydration, optimally 4-5 glasses per day Recommend to:  Continue Ozempic  [facility] Future Considerations:  Potential for Farxiga 10 mg to optimize cardiorenal protection  Patient denies personal or family history of multiple endocrine neoplasia type 2,  medullary thyroid  cancer; personal history of pancreatitis or gallbladder disease., Discussed side effects of gastrointestinal upset/nausea; eating smaller meals, avoiding high-fat foods, and remaining upright after eating may reduce nausea. Discussed that overeating is a major trigger of nausea with this class of medications, as often times patients will start to feel full sooner and may need to decrease portion sizes from what they were previously accustomed to.   Hyperlipidemia/ASCVD Risk Reduction: Currently uncontrolled. Goal <55 mg/dL due to history of ASCVD (MI) Reviewed long term complications of uncontrolled cholesterol Recommend to:  Continue Repatha   Sample for 1 pen provided today No longer requires [employer] [place] due to approval for LIS Patient to get updated lipid panel next week at PCP visits G72 for statin-induce myopathies dropped for this visit   Follow Up Plan:  PCP: 06/23/24  Sara [name], PharmD PGY1 Pharmacy Resident  06/16/2024   Sara [name], PharmD, BCACP, CPP Clinical Pharmacist, [facility]

## 2024-06-14 DIAGNOSIS — R55 Syncope and collapse: Secondary | ICD-10-CM

## 2024-06-15 ENCOUNTER — Ambulatory Visit: Payer: Self-pay | Admitting: Cardiology

## 2024-06-16 ENCOUNTER — Other Ambulatory Visit: Payer: Self-pay | Admitting: Family Medicine

## 2024-06-16 ENCOUNTER — Ambulatory Visit: Admitting: Pharmacist

## 2024-06-16 ENCOUNTER — Other Ambulatory Visit (HOSPITAL_COMMUNITY): Payer: Self-pay

## 2024-06-16 ENCOUNTER — Telehealth: Payer: Self-pay | Admitting: Pharmacist

## 2024-06-16 VITALS — BP 116/70

## 2024-06-16 DIAGNOSIS — E785 Hyperlipidemia, unspecified: Secondary | ICD-10-CM | POA: Diagnosis not present

## 2024-06-16 DIAGNOSIS — I2510 Atherosclerotic heart disease of native coronary artery without angina pectoris: Secondary | ICD-10-CM | POA: Diagnosis not present

## 2024-06-16 DIAGNOSIS — E1169 Type 2 diabetes mellitus with other specified complication: Secondary | ICD-10-CM

## 2024-06-16 DIAGNOSIS — E1159 Type 2 diabetes mellitus with other circulatory complications: Secondary | ICD-10-CM | POA: Diagnosis not present

## 2024-06-16 DIAGNOSIS — G720 Drug-induced myopathy: Secondary | ICD-10-CM | POA: Diagnosis not present

## 2024-06-16 DIAGNOSIS — I152 Hypertension secondary to endocrine disorders: Secondary | ICD-10-CM | POA: Diagnosis not present

## 2024-06-16 DIAGNOSIS — Z7985 Long-term (current) use of injectable non-insulin antidiabetic drugs: Secondary | ICD-10-CM

## 2024-06-16 MED ORDER — SEMAGLUTIDE (1 MG/DOSE) 4 MG/3ML ~~LOC~~ SOPN: 1.0000 mg | PEN_INJECTOR | SUBCUTANEOUS | 5 refills | Status: DC

## 2024-06-16 MED ORDER — REPATHA SURECLICK 140 MG/ML ~~LOC~~ SOAJ: 140.0000 mg | SUBCUTANEOUS | 6 refills | Status: AC

## 2024-06-16 NOTE — Telephone Encounter (Signed)
 Was this a change from you guys recently?

## 2024-06-16 NOTE — Telephone Encounter (Signed)
 MOUNJARO 2.5 MG/0.5ML Pen        Changed from: Semaglutide , 1 MG/DOSE, 4 MG/3ML SOPN   Pharmacy comment: Alternative Requested:THE PRESCRIBED MEDICATION IS NOT COVERED BY INSURANCE. PLEASE CONSIDER CHANGING TO ONE OF THE SUGGESTED COVERED ALTERNATIVES.   All Pharmacy Suggested Alternatives:  tirzepatide (MOUNJARO) 2.5 MG/0.5ML Pen Dulaglutide (TRULICITY) 0.75 MG/0.5ML SOAJ

## 2024-06-17 ENCOUNTER — Telehealth: Payer: Self-pay | Admitting: Pharmacist

## 2024-06-17 ENCOUNTER — Telehealth: Payer: Self-pay

## 2024-06-17 ENCOUNTER — Telehealth: Payer: Self-pay | Admitting: Pharmacy Technician

## 2024-06-17 NOTE — Telephone Encounter (Signed)
-----   Message from Lynwood Schilling sent at 06/15/2024 10:59 AM EST ----- She had short runs of supraventricular tachycardia but no etiology for syncope.  No further work up.  Call Ms. Mayor with the results and send results to Jolinda Norene HERO, DO ----- Message ----- From: Schilling [place], MD Sent: 06/14/2024   9:03 PM EST To: Lynwood Schilling, MD

## 2024-06-17 NOTE — Progress Notes (Signed)
   06/17/2024 Name: Sara [name] MRN: [id number] DOB: November 11, 1945  Patient is appearing for a follow-up visit with the population health pharmacy technician. Last engaged with the clinical pharmacist to discuss diabetes, hypertension, medication access, and hyperlipidemia on 06/17/2024. Contacted pharmacy today to discuss medication access.   Plan from last clinical pharmacist appointment:  Diabetes: Currently controlled. Goal of <7% Cardiorenal risk reduction is opportunities for improvement.  Blood pressure is at goal of <130/80 mmHg Today's BP of 116/70 LDL is not at goal of <55 mg/dL Reviewed long term cardiovascular and renal outcomes of uncontrolled blood sugar Reviewed goal A1c, goal fasting, and goal 2 hour post prandial glucose Reviewed dietary modifications, encouraged to increase hydration, optimally 4-5 glasses per day Recommend to:  Continue Ozempic  1 [facility] Future Considerations:  Potential for Farxiga 10 mg to optimize cardiorenal protection  Patient denies personal or family history of multiple endocrine neoplasia type 2, medullary thyroid  cancer; personal history of pancreatitis or gallbladder disease., Discussed side effects of gastrointestinal upset/nausea; eating smaller meals, avoiding high-fat foods, and remaining upright after eating may reduce nausea. Discussed that overeating is a major trigger of nausea with this class of medications, as often times patients will start to feel full sooner and may need to decrease portion sizes from what they were previously accustomed to.   Hyperlipidemia/ASCVD Risk Reduction: Currently uncontrolled. Goal <55 mg/dL due to history of ASCVD (MI) Reviewed long term complications of uncontrolled cholesterol Recommend to:  Continue Repatha   Sample for 1 pen provided today No longer requires [employer] [place] due to approval for LIS Patient to get updated lipid panel next week at PCP visits G72 for statin-induce  myopathies dropped for this visit Follow Up Plan:  PCP: 06/23/24      Medication Adherence Barriers Identified:  Access issues with any new medication or testing device: Yes Repatha  and Ozempic    Medication Adherence Barriers Addressed/Actions Taken:  Medication Access for Repatha  and Ozempic  Will discuss medication access concerns with pharmacist Contacted pharmacy regarding new prescriptions [facility] on the prices of Repatha  and Ozempic  [facility] who informs Repatha  will cost patient $12.15 for a 3 month supply. They had to order the medication and it hopefully should be in later today. For the Ozempic , the claim with [employer] rejected. Per pharmacy staff the rejection says Prescribed medication is not covered by insurance, please consider alternative medication. Inquired if a list of alternatives was provided with the rejection and staff member says no list provided. Will send to pharmacist to consider an alternative.   Next clinical pharmacist appointment is scheduled for: TBD  Kate Caddy, CPhT [facility] Office: [phone] Email: [email or web address]

## 2024-06-17 NOTE — Telephone Encounter (Signed)
 The patient has been notified of the result and verbalized understanding.  All questions (if any) were answered. Aleck LOISE Bill, RN 06/17/2024 9:50 AM  Results sent to PCP. Pt stated she had another episode of syncope while on the toilet this week and called EMS. Pt stated she did not fully lose consciousness. Pt was given ED precautions and told that Dr. Lavona would be notified.

## 2024-06-17 NOTE — Telephone Encounter (Signed)
   Unsuccessful outreach to patient to discuss Ozempic  to Mounjaro transition due to insurance preference.  No VM available.  Patient has enough Ozempic  to last until 07/2024 and she will f/u with PharmD at that time.  Will continue to follow.  Coded for G72 at last visit.  Follow up appts: PCP 06/23/24 PharmD 07/28/24   Mliss Tarry Griffin, PharmD, BCACP, CPP Clinical Pharmacist, [facility]

## 2024-06-17 NOTE — Telephone Encounter (Signed)
 She has Ozempic  to last until the end of year so I will put her on my calendar for January to switch her to Mounjaro Called patient to explain, but she did not answer phone or mobile (no VM available), but I will continue to follow Will reject change from pharmacy and f/u Thank you!

## 2024-06-23 ENCOUNTER — Other Ambulatory Visit: Payer: Self-pay | Admitting: Family Medicine

## 2024-06-23 ENCOUNTER — Telehealth: Payer: Self-pay | Admitting: Family Medicine

## 2024-06-23 ENCOUNTER — Ambulatory Visit: Payer: Self-pay | Admitting: Family Medicine

## 2024-06-23 ENCOUNTER — Encounter: Payer: Self-pay | Admitting: Family Medicine

## 2024-06-23 VITALS — BP 104/53 | HR 75 | Temp 98.1°F | Ht 64.0 in | Wt 213.5 lb

## 2024-06-23 DIAGNOSIS — I2510 Atherosclerotic heart disease of native coronary artery without angina pectoris: Secondary | ICD-10-CM | POA: Diagnosis not present

## 2024-06-23 DIAGNOSIS — G720 Drug-induced myopathy: Secondary | ICD-10-CM | POA: Diagnosis not present

## 2024-06-23 DIAGNOSIS — E1159 Type 2 diabetes mellitus with other circulatory complications: Secondary | ICD-10-CM | POA: Diagnosis not present

## 2024-06-23 DIAGNOSIS — Z7985 Long-term (current) use of injectable non-insulin antidiabetic drugs: Secondary | ICD-10-CM | POA: Diagnosis not present

## 2024-06-23 DIAGNOSIS — Z23 Encounter for immunization: Secondary | ICD-10-CM | POA: Diagnosis not present

## 2024-06-23 DIAGNOSIS — M810 Age-related osteoporosis without current pathological fracture: Secondary | ICD-10-CM | POA: Diagnosis not present

## 2024-06-23 DIAGNOSIS — K219 Gastro-esophageal reflux disease without esophagitis: Secondary | ICD-10-CM | POA: Diagnosis not present

## 2024-06-23 DIAGNOSIS — E1169 Type 2 diabetes mellitus with other specified complication: Secondary | ICD-10-CM

## 2024-06-23 DIAGNOSIS — E119 Type 2 diabetes mellitus without complications: Secondary | ICD-10-CM | POA: Diagnosis not present

## 2024-06-23 DIAGNOSIS — R55 Syncope and collapse: Secondary | ICD-10-CM

## 2024-06-23 DIAGNOSIS — I152 Hypertension secondary to endocrine disorders: Secondary | ICD-10-CM | POA: Diagnosis not present

## 2024-06-23 DIAGNOSIS — E785 Hyperlipidemia, unspecified: Secondary | ICD-10-CM | POA: Diagnosis not present

## 2024-06-23 DIAGNOSIS — T466X5A Adverse effect of antihyperlipidemic and antiarteriosclerotic drugs, initial encounter: Secondary | ICD-10-CM | POA: Diagnosis not present

## 2024-06-23 LAB — BAYER DCA HB A1C WAIVED: HB A1C (BAYER DCA - WAIVED): 5.3 % (ref 4.8–5.6)

## 2024-06-23 MED ORDER — LOSARTAN POTASSIUM 25 MG PO TABS: 25.0000 mg | ORAL_TABLET | Freq: Two times a day (BID) | ORAL | 3 refills | Status: AC

## 2024-06-23 MED ORDER — LANCET DEVICE MISC: 0 refills | Status: AC

## 2024-06-23 MED ORDER — LANCETS MISC: 3 refills | Status: AC

## 2024-06-23 MED ORDER — POTASSIUM CHLORIDE ER 10 MEQ PO TBCR: 10.0000 meq | EXTENDED_RELEASE_TABLET | Freq: Every day | ORAL | 3 refills | Status: AC

## 2024-06-23 MED ORDER — OMEPRAZOLE 20 MG PO CPDR: 20.0000 mg | DELAYED_RELEASE_CAPSULE | Freq: Every day | ORAL | 3 refills | Status: AC

## 2024-06-23 MED ORDER — BLOOD GLUCOSE MONITORING SUPPL DEVI: 0 refills | Status: AC

## 2024-06-23 MED ORDER — BLOOD GLUCOSE TEST VI STRP: ORAL_STRIP | 3 refills | Status: AC

## 2024-06-23 MED ORDER — SPIRONOLACTONE 25 MG PO TABS: 12.5000 mg | ORAL_TABLET | Freq: Two times a day (BID) | ORAL | 3 refills | Status: AC

## 2024-06-23 MED ORDER — FUROSEMIDE 20 MG PO TABS: 20.0000 mg | ORAL_TABLET | Freq: Every day | ORAL | 3 refills | Status: AC

## 2024-06-23 NOTE — Progress Notes (Signed)
 Subjective: CC:DM PCP: Jolinda Norene HERO, DO YEP:Sara [name] is a 78 y.o. female presenting to clinic today for:  Type 2 Diabetes with hypertension, hyperlipidemia:  She does not have a glucometer and is asking for a new one today.  She has had a couple of episodes of syncope, she has had cardiac evaluation and this was determined not to be cardiac in nature and is felt to be possibly secondary to dehydration versus vasovagal syncope.  She admits that she had some type of GI illness preceding the last syncopal episode.  She was determined to be dehydrated at that time.  She otherwise feels good and has not had any fluctuations in blood pressure or blood sugar to her knowledge.  She does voice concern because she is not sure that her Ozempic  is going to be affordable next year and this is been a medication that has really worked well for her and she has felt good on.  She is able to get Repatha  and has been paying $12 for that.   Diabetes Health Maintenance Due  Topic Date Due   HEMOGLOBIN A1C  06/23/2024   FOOT EXAM  12/22/2024   OPHTHALMOLOGY EXAM  05/25/2025    ROS: Per HPI  Allergies  Allergen Reactions   Latex Other (See Comments)   Nifedipine Other (See Comments)    Brings blood pressure up really fast PROCARDIA   Tape Other (See Comments)    bleeding   Codeine Itching   Crestor  [Rosuvastatin ] Other (See Comments)    myalgias   Past Medical History:  Diagnosis Date   Arthritis    Arthritis -hands -Bil. 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 s/p Xience DES x 3 to LAD;  b. nuc study 02/27/10: EF 67% no ischemia;  c.  echo 11/12: Mild LVH, EF 55-65%, grade 1 diastolic dysfunction, moderate LAE;  d.  LHC 07/03/11: LAD stents patent, RCA 25%, EF 55-65% ;  e. Adeno. Myoview  5/14:  No ischemia, EF 68%   COVID-19 2021   Depression    Diverticular disease    Edema  02/28/2009   Qualifier: Diagnosis of   By: Cherrie, MD, CODY Toribio SAUNDERS        GERD (gastroesophageal reflux disease)    Hemorrhoid    Hepatitis    yellow jaundice as a child    HTN (hypertension)    Hyperlipidemia    Hypertension    Impaired hearing    bilateral- no hearing aids   Myocardial infarction (HCC)    hx of x 2 3-4 years ago per pt   Obesity    OSA (obstructive sleep apnea)    needs no cpap per patient pt denies   Other psoriasis    ongoing- none at present.   Vertigo    VERTIGO 12/09/2008   Qualifier: Diagnosis of   By: Rolan Hough          Current Outpatient Medications:    albuterol  (VENTOLIN  HFA) 108 (90 Base) MCG/ACT inhaler, TAKE 2 PUFFS BY MOUTH EVERY 6 HOURS AS NEEDED FOR WHEEZE OR SHORTNESS OF BREATH, Disp: 18 each, Rfl: 1   alendronate  (FOSAMAX ) 70 MG tablet, TAKE 1 TABLET EVERY 7 DAYS WITH A FULL GLASS OF WATER  ON AN EMPTY STOMACH, Disp: 12 tablet, Rfl: 4   aspirin  EC 81 MG tablet, Take 81 mg  by mouth daily., Disp: , Rfl:    Calcium  Carbonate-Vitamin D  (SUPER CALCIUM  600 + D3 PO), Take 1 tablet by mouth daily., Disp: , Rfl:    diltiazem  (CARDIZEM  CD) 120 MG 24 hr capsule, TAKE 1 CAPSULE BY MOUTH EVERY DAY, Disp: 90 capsule, Rfl: 0   Evolocumab  (REPATHA  SURECLICK) 140 MG/ML SOAJ, Inject 140 mg into the skin every 14 (fourteen) days., Disp: 6 mL, Rfl: 6   furosemide  (LASIX ) 20 MG tablet, Take 1 tablet (20 mg total) by mouth daily., Disp: 90 tablet, Rfl: 3   losartan  (COZAAR ) 25 MG tablet, TAKE 1 TABLET BY MOUTH TWICE A DAY, Disp: 180 tablet, Rfl: 1   mometasone  (NASONEX ) 50 MCG/ACT nasal spray, PLACE 2 SPRAYS INTO THE NOSE DAILY., Disp: 17 each, Rfl: 5   nitroGLYCERIN  (NITROSTAT ) 0.4 MG SL tablet, Place 1 tablet (0.4 mg total) under the tongue every 5 (five) minutes as needed for chest pain (x 3 doses). Reported on 12/05/2015, Disp: 25 tablet, Rfl: 1   omeprazole  (PRILOSEC) 20 MG capsule, TAKE 1 CAPSULE BY MOUTH EVERY DAY, Disp: 90 capsule, Rfl: 0   potassium  chloride (KLOR-CON ) 10 MEQ tablet, TAKE 1 TABLET BY MOUTH EVERY DAY, Disp: 90 tablet, Rfl: 0   Semaglutide , 1 MG/DOSE, 4 MG/3ML SOPN, Inject 1 mg as directed once a week. DX:E11.9, Disp: 9 mL, Rfl: 5   spironolactone  (ALDACTONE ) 25 MG tablet, Take 0.5 tablets (12.5 mg total) by mouth 2 (two) times daily., Disp: 90 tablet, Rfl: 3   budesonide -formoterol  (SYMBICORT ) 80-4.5 MCG/ACT inhaler, Inhale 2 puffs into the lungs 2 (two) times daily. (Patient not taking: Reported on 06/23/2024), Disp: 1 each, Rfl: 3 Social History   Socioeconomic History   Marital status: Married    Spouse name: Beryl   Number of children: 3   Years of education: Not on file   Highest education level: Not on file  Occupational History   Occupation: hairdresser  Tobacco Use   Smoking status: Never   Smokeless tobacco: Never  Vaping Use   Vaping status: Never Used  Substance and Sexual Activity   Alcohol  use: No   Drug use: No   Sexual activity: Not Currently    Birth control/protection: Post-menopausal  Other Topics Concern   Not on file  Social History Narrative   Lives with husband, Beryl.    1 son died in 06-29-18 from ALS.    Social Drivers of Corporate Investment Banker Strain: Low Risk  (09/17/2023)   Overall Financial Resource Strain (CARDIA)    Difficulty of Paying Living Expenses: Not hard at all  Food Insecurity: No Food Insecurity (09/17/2023)   Hunger Vital Sign    Worried About Running Out of Food in the Last Year: Never true    Ran Out of Food in the Last Year: Never true  Transportation Needs: No Transportation Needs (09/17/2023)   PRAPARE - Administrator, Civil Service (Medical): No    Lack of Transportation (Non-Medical): No  Physical Activity: Insufficiently Active (09/17/2023)   Exercise Vital Sign    Days of Exercise per Week: 3 days    Minutes of Exercise per Session: 30 min  Stress: No Stress Concern Present (09/17/2023)   [employer] of Occupational Health -  Occupational Stress Questionnaire    Feeling of Stress : Not at all  Social Connections: Socially Integrated (09/17/2023)   Social Connection and Isolation Panel    Frequency of Communication with Friends and Family: More than three times a  week    Frequency of Social Gatherings with Friends and Family: Three times a week    Attends Religious Services: More than 4 times per year    Active Member of Clubs or Organizations: Yes    Attends Banker Meetings: More than 4 times per year    Marital Status: Married  Catering Manager Violence: Not At Risk (09/17/2023)   Humiliation, Afraid, Rape, and Kick questionnaire    Fear of Current or Ex-Partner: No    Emotionally Abused: No    Physically Abused: No    Sexually Abused: No   Family History  Problem Relation Age of Onset   Stroke Mother    Hypertension Mother    Heart failure Mother    Breast cancer Mother    Aneurysm Father    Seizures Brother    Muscular dystrophy Daughter        dx as infant, passed age 91   Seizures Son    ALS Son    Colon cancer Neg Hx    Esophageal cancer Neg Hx    Rectal cancer Neg Hx    Stomach cancer Neg Hx     Objective: Office vital signs reviewed. BP (!) 104/53   Pulse 75   Temp 98.1 F (36.7 C)   Ht 5' 4 (1.626 m)   Wt 213 lb 8 oz (96.8 kg)   SpO2 91%   BMI 36.65 kg/m   Physical Examination:  General: Awake, alert, morbidly obese, No acute distress HEENT: Sclera white.  Moist mucous membranes.  No nystagmus Cardio: regular rate and rhythm, S1S2 heard, no murmurs appreciated Pulm: clear to auscultation bilaterally, no wheezes, rhonchi or rales; normal work of breathing on room air MSK: Relating independently Neuro: No focal neurologic deficits   Lab Results  Component Value Date   HGBA1C 5.2 12/23/2023    Assessment/ Plan: 78 y.o. female   Diabetes mellitus treated with injections of non-insulin medication (HCC) - Plan: CMP14+EGFR, Bayer DCA Hb A1c Waived, Blood  Glucose Monitoring Suppl DEVI, Glucose Blood (BLOOD GLUCOSE TEST STRIPS) STRP, Lancet Device MISC, Lancets MISC  Hypertension associated with diabetes (HCC) - Plan: CMP14+EGFR, furosemide  (LASIX ) 20 MG tablet, losartan  (COZAAR ) 25 MG tablet, potassium chloride  (KLOR-CON ) 10 MEQ tablet, spironolactone  (ALDACTONE ) 25 MG tablet  Hyperlipidemia associated with type 2 diabetes mellitus (HCC) - Plan: CMP14+EGFR, Lipid Panel, TSH  Statin myopathy - Plan: CMP14+EGFR  Coronary artery disease involving native coronary artery of native heart without angina pectoris - Plan: CMP14+EGFR, TSH  Morbid obesity (HCC) - Plan: VITAMIN D  25 Hydroxy (Vit-D Deficiency, Fractures)  Age-related osteoporosis without current pathological fracture - Plan: CMP14+EGFR, VITAMIN D  25 Hydroxy (Vit-D Deficiency, Fractures), DG [facility] DEXA  Gastroesophageal reflux disease without esophagitis - Plan: omeprazole  (PRILOSEC) 20 MG capsule  Vasovagal syncope  Encounter for immunization - Plan: Flu vaccine HIGH DOSE PF(Fluzone Trivalent)  Check labs.  Medications have been renewed.  New glucose meter sent.  She is interested in possibly doing a CGM at some point if insurance will cover it  She will continue Repatha  as directed.  We discussed that she should contact her insurance company to see if the Ozempic  cost is simply meeting an injectable or if this is what she will be expected to pay, she was quoted $300 recently at the pharmacy.  Check vitamin D  level, repeat DEXA scan, calcium  level given her known osteoporosis.  Currently treated with Fosamax   GERD is stable with PPI.  This is renewed  If  she has recurrent syncope in the absence of dehydration or other events, would maybe consider eval with neurology given family history of hydrocephalus and multiple family members.  Cannot rule out possible neurologic etiology.  That she demonstrates no focal neurologic deficits on exam.  She would be interested in seeing Dr. Tobie if  we do place referral as this is who her daughter sees  Influenza vaccination administered   Norene CHRISTELLA Fielding, DO [facility] [phone]

## 2024-06-23 NOTE — Telephone Encounter (Signed)
 Pt needs appt for DEXA

## 2024-06-24 ENCOUNTER — Ambulatory Visit: Payer: Self-pay | Admitting: Family Medicine

## 2024-06-24 LAB — CMP14+EGFR
ALT: 11 IU/L (ref 0–32)
AST: 19 IU/L (ref 0–40)
Albumin: 3.5 g/dL — ABNORMAL LOW (ref 3.8–4.8)
Alkaline Phosphatase: 61 IU/L (ref 49–135)
BUN/Creatinine Ratio: 14 (ref 12–28)
BUN: 10 mg/dL (ref 8–27)
Bilirubin Total: 1.1 mg/dL (ref 0.0–1.2)
CO2: 23 mmol/L (ref 20–29)
Calcium: 9.3 mg/dL (ref 8.7–10.3)
Chloride: 105 mmol/L (ref 96–106)
Creatinine, Ser: 0.7 mg/dL (ref 0.57–1.00)
Globulin, Total: 2.4 g/dL (ref 1.5–4.5)
Glucose: 104 mg/dL — ABNORMAL HIGH (ref 70–99)
Potassium: 4.2 mmol/L (ref 3.5–5.2)
Sodium: 141 mmol/L (ref 134–144)
Total Protein: 5.9 g/dL — ABNORMAL LOW (ref 6.0–8.5)
eGFR: 88 mL/min/1.73 (ref >59)

## 2024-06-24 LAB — VITAMIN D 25 HYDROXY (VIT D DEFICIENCY, FRACTURES): Vit D, 25-Hydroxy: 54.6 ng/mL (ref 30.0–100.0)

## 2024-06-24 LAB — LIPID PANEL
Chol/HDL Ratio: 2.4 ratio (ref 0.0–4.4)
Cholesterol, Total: 133 mg/dL (ref 100–199)
HDL: 56 mg/dL (ref >39)
LDL Chol Calc (NIH): 66 mg/dL (ref 0–99)
Triglycerides: 51 mg/dL (ref 0–149)
VLDL Cholesterol Cal: 11 mg/dL (ref 5–40)

## 2024-06-24 LAB — TSH: TSH: 1.23 u[IU]/mL (ref 0.450–4.500)

## 2024-06-25 ENCOUNTER — Ambulatory Visit: Admitting: Family Medicine

## 2024-06-25 ENCOUNTER — Ambulatory Visit: Payer: Self-pay | Admitting: Family Medicine

## 2024-06-25 VITALS — BP 106/67 | HR 87 | Temp 98.3°F | Ht 64.0 in | Wt 214.0 lb

## 2024-06-25 DIAGNOSIS — R82998 Other abnormal findings in urine: Secondary | ICD-10-CM

## 2024-06-25 DIAGNOSIS — B379 Candidiasis, unspecified: Secondary | ICD-10-CM | POA: Diagnosis not present

## 2024-06-25 DIAGNOSIS — R300 Dysuria: Secondary | ICD-10-CM | POA: Diagnosis not present

## 2024-06-25 LAB — MICROSCOPIC EXAMINATION: Renal Epithel, UA: NONE SEEN /HPF

## 2024-06-25 LAB — URINALYSIS, ROUTINE W REFLEX MICROSCOPIC
Glucose, UA: NEGATIVE
Nitrite, UA: NEGATIVE
Specific Gravity, UA: 1.025 (ref 1.005–1.030)
Urobilinogen, Ur: 4 mg/dL — ABNORMAL HIGH (ref 0.2–1.0)
pH, UA: 6 (ref 5.0–7.5)

## 2024-06-25 MED ORDER — SULFAMETHOXAZOLE-TRIMETHOPRIM 800-160 MG PO TABS: 1.0000 | ORAL_TABLET | Freq: Two times a day (BID) | ORAL | 0 refills | Status: AC

## 2024-06-25 MED ORDER — FLUCONAZOLE 150 MG PO TABS: 150.0000 mg | ORAL_TABLET | Freq: Once | ORAL | 0 refills | Status: AC

## 2024-06-25 NOTE — Patient Instructions (Signed)
 Follow up if symptoms acutely worsen, no improvement over the next 2-3 days, fever develops, or for any other concerns

## 2024-06-25 NOTE — Progress Notes (Deleted)
[facility] ?

## 2024-06-25 NOTE — Progress Notes (Signed)
   Acute Office Visit  Patient ID: Sara [name], female    DOB: [birth year], 78 y.o.   MRN: [id number]  PCP: Jolinda Norene HERO, DO  Chief Complaint  Patient presents with   Dysuria    Subjective:     HPI  Discussed the use of AI scribe software for clinical note transcription with the patient, who gave verbal consent to proceed.  History of Present Illness   Sara [name] is a 78 year old female who presents with dysuria.  Dysuria - Burning sensation during urination began last night - Persistent burning sensation today with urination - Urine described as 'really yellow'  Associated symptoms - No fever - No vomiting - No abdominal pain - No back pain - No visible hematuria - No vaginal discharge or itching   Urinary tract infection history - No urinary tract infection for several years       ROS     Objective:    BP 106/67   Pulse 87   Temp 98.3 F (36.8 C)   Ht 5' 4 (1.626 m)   Wt 214 lb (97.1 kg)   SpO2 96%   BMI 36.73 kg/m    Physical Exam    No results found for any visits on 06/25/24.     Assessment & Plan:   Problem List Items Addressed This Visit   None Visit Diagnoses       Dysuria    -  Primary   Relevant Medications   sulfamethoxazole -trimethoprim  (BACTRIM  DS) 800-160 MG tablet   Other Relevant Orders   Urinalysis, Routine w reflex microscopic   Urine Culture     Urine leukocytes       Relevant Medications   sulfamethoxazole -trimethoprim  (BACTRIM  DS) 800-160 MG tablet     Yeast detected       Relevant Medications   fluconazole  (DIFLUCAN ) 150 MG tablet       Assessment and Plan    Acute urinary tract infection Urine sample collected. Awaiting culture results for targeted antibiotic therapy. - UA results: blood +1, leuks +1, no nitrities. Yeast present.  - Urine culture, results pending - Ordered bactrim  BID x 5 days to treat empirically for UTI. - Ordered diflucan  to treat yeast that is present,  likely vulvovaginal. Awaiting urine culture results.        Meds ordered this encounter  Medications   sulfamethoxazole -trimethoprim  (BACTRIM  DS) 800-160 MG tablet    Sig: Take 1 tablet by mouth 2 (two) times daily for 5 days.    Dispense:  10 tablet    Refill:  0    Supervising Provider:   GOTTSCHALK, ASHLY M [[id number]]   fluconazole  (DIFLUCAN ) 150 MG tablet    Sig: Take 1 tablet (150 mg total) by mouth once for 1 dose.    Dispense:  1 tablet    Refill:  0    Supervising Provider:   JOLINDA NORENE HERO [[id number]]    Return if symptoms worsen or fail to improve.  Oneil LELON Severin, FNP [facility]

## 2024-06-26 ENCOUNTER — Other Ambulatory Visit: Payer: Self-pay | Admitting: Family Medicine

## 2024-06-26 DIAGNOSIS — R8290 Unspecified abnormal findings in urine: Secondary | ICD-10-CM

## 2024-06-26 NOTE — Telephone Encounter (Signed)
-----   Message from Lynwood Schilling sent at 06/20/2024 12:48 PM EST ----- So far I don't see a reason for the syncope.  I am suspecting that these are vagal episodes. No further work up at this point.  Call Ms. Linhardt with the results and send results to Jolinda Norene HERO, DO ----- Message ----- From: Sanjuan Aleck SAILOR, RN Sent: 06/17/2024   9:52 AM EST To: Lynwood Schilling, MD  ----- Message from Aleck SAILOR Sanjuan, RN sent at 06/17/2024  9:52 AM EST -----  RICK ----- Message ----- From: Schilling Lynwood, MD Sent: 06/15/2024  10:59 AM EST To: Aleck SAILOR Clodagh Odenthal, RN  She had short runs of supraventricular tachycardia but no etiology for syncope.  No further work up.  Call Ms. Boord with the results and send results to Jolinda Norene HERO, DO ----- Message ----- From: Schilling Lynwood, MD Sent: 06/14/2024   9:03 PM EST To: Lynwood Schilling, MD

## 2024-06-26 NOTE — Telephone Encounter (Signed)
 Called and scheduled appt for 06/29/24

## 2024-06-26 NOTE — Telephone Encounter (Signed)
 Spoke with pt regarding Dr. Denver response. Pt verbalized understanding. All questions if any were answered.

## 2024-06-29 ENCOUNTER — Ambulatory Visit: Admitting: Family Medicine

## 2024-06-29 ENCOUNTER — Other Ambulatory Visit

## 2024-06-29 ENCOUNTER — Encounter: Payer: Self-pay | Admitting: Family Medicine

## 2024-06-29 VITALS — BP 125/67 | HR 73 | Temp 98.0°F | Ht 64.0 in | Wt 214.4 lb

## 2024-06-29 DIAGNOSIS — R8290 Unspecified abnormal findings in urine: Secondary | ICD-10-CM

## 2024-06-29 DIAGNOSIS — M810 Age-related osteoporosis without current pathological fracture: Secondary | ICD-10-CM

## 2024-06-29 DIAGNOSIS — N6315 Unspecified lump in the right breast, overlapping quadrants: Secondary | ICD-10-CM

## 2024-06-29 LAB — URINE CULTURE

## 2024-06-29 LAB — URINALYSIS, ROUTINE W REFLEX MICROSCOPIC
Bilirubin, UA: NEGATIVE
Glucose, UA: NEGATIVE
Ketones, UA: NEGATIVE
Leukocytes,UA: NEGATIVE
Nitrite, UA: NEGATIVE
Protein,UA: NEGATIVE
RBC, UA: NEGATIVE
Specific Gravity, UA: 1.015 (ref 1.005–1.030)
Urobilinogen, Ur: 4 mg/dL — ABNORMAL HIGH (ref 0.2–1.0)
pH, UA: 7 (ref 5.0–7.5)

## 2024-06-29 LAB — CMP14+EGFR
ALT: 11 IU/L (ref 0–32)
AST: 20 IU/L (ref 0–40)
Albumin: 3.7 g/dL — ABNORMAL LOW (ref 3.8–4.8)
Alkaline Phosphatase: 52 IU/L (ref 49–135)
BUN/Creatinine Ratio: 13 (ref 12–28)
BUN: 12 mg/dL (ref 8–27)
Bilirubin Total: 0.9 mg/dL (ref 0.0–1.2)
CO2: 20 mmol/L (ref 20–29)
Calcium: 9.5 mg/dL (ref 8.7–10.3)
Chloride: 106 mmol/L (ref 96–106)
Creatinine, Ser: 0.94 mg/dL (ref 0.57–1.00)
Globulin, Total: 2.2 g/dL (ref 1.5–4.5)
Glucose: 102 mg/dL — ABNORMAL HIGH (ref 70–99)
Potassium: 4.5 mmol/L (ref 3.5–5.2)
Sodium: 136 mmol/L (ref 134–144)
Total Protein: 5.9 g/dL — ABNORMAL LOW (ref 6.0–8.5)
eGFR: 62 mL/min/1.73 (ref >59)

## 2024-06-29 NOTE — Progress Notes (Signed)
 Subjective: CC: Breast knots PCP: Jolinda Norene HERO, DO Sara [name] is a 78 y.o. female presenting to clinic today for:  Patient reports that she totally forgot to mention the knots in her breast when she was seen a week ago.  She was actually contacting her facility for her mammogram that is scheduled tomorrow and they recommended that she be reevaluated for consideration of diagnostic mammogram.  She denies any nipple discharge, unplanned weight loss or night sweats.  She does report some easy bruising.  There are palpable knots in the right breast.  She does have history of having had a bruise or hematoma on that side in the past  Saw my partner recently for dysuria and urinalysis with urine with urobilinogen.  Follow-up studies recommended.  She would like to get these done today as well  ROS: Per HPI  Allergies  Allergen Reactions   Latex Other (See Comments)   Nifedipine Other (See Comments)    Brings blood pressure up really fast PROCARDIA   Tape Other (See Comments)    bleeding   Codeine Itching   Crestor  [Rosuvastatin ] Other (See Comments)    myalgias   Past Medical History:  Diagnosis Date   Arthritis    Arthritis -hands -Bil. 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 s/p Xience DES x 3 to LAD;  b. nuc study 02/27/10: EF 67% no ischemia;  c.  echo 11/12: Mild LVH, EF 55-65%, grade 1 diastolic dysfunction, moderate LAE;  d.  LHC 07/03/11: LAD stents patent, RCA 25%, EF 55-65% ;  e. Adeno. Myoview  5/14:  No ischemia, EF 68%   COVID-19 2021   Depression    Diverticular disease    Edema 02/28/2009   Qualifier: Diagnosis of   By: Cherrie, MD, CODY Toribio SAUNDERS        GERD (gastroesophageal reflux disease)    Hemorrhoid    Hepatitis    yellow jaundice as a child    HTN (hypertension)    Hyperlipidemia    Hypertension    Impaired hearing    bilateral- no  hearing aids   Myocardial infarction (HCC)    hx of x 2 3-4 years ago per pt   Obesity    OSA (obstructive sleep apnea)    needs no cpap per patient pt denies   Other psoriasis    ongoing- none at present.   Vertigo    VERTIGO 12/09/2008   Qualifier: Diagnosis of   By: Rolan Hough          Current Outpatient Medications:    albuterol  (VENTOLIN  HFA) 108 (90 Base) MCG/ACT inhaler, TAKE 2 PUFFS BY MOUTH EVERY 6 HOURS AS NEEDED FOR WHEEZE OR SHORTNESS OF BREATH, Disp: 18 each, Rfl: 1   alendronate  (FOSAMAX ) 70 MG tablet, TAKE 1 TABLET EVERY 7 DAYS WITH A FULL GLASS OF WATER  ON AN EMPTY STOMACH, Disp: 12 tablet, Rfl: 4   aspirin  EC 81 MG tablet, Take 81 mg by mouth daily., Disp: , Rfl:    Blood Glucose Monitoring Suppl DEVI, Check BGs once daily. E11.9.  Dispense based on patient and insurance preference., Disp: 1 each, Rfl: 0   budesonide -formoterol  (SYMBICORT ) 80-4.5 MCG/ACT inhaler, Inhale 2 puffs into the lungs 2 (two) times daily. (Patient not taking: Reported on 06/23/2024), Disp: 1 each, Rfl: 3   Calcium  Carbonate-Vitamin D  (  SUPER CALCIUM  600 + D3 PO), Take 1 tablet by mouth daily., Disp: , Rfl:    diltiazem  (CARDIZEM  CD) 120 MG 24 hr capsule, TAKE 1 CAPSULE BY MOUTH EVERY DAY, Disp: 90 capsule, Rfl: 0   Evolocumab  (REPATHA  SURECLICK) 140 MG/ML SOAJ, Inject 140 mg into the skin every 14 (fourteen) days., Disp: 6 mL, Rfl: 6   furosemide  (LASIX ) 20 MG tablet, Take 1 tablet (20 mg total) by mouth daily., Disp: 100 tablet, Rfl: 3   Glucose Blood (BLOOD GLUCOSE TEST STRIPS) STRP, Check BGs once daily. E11.9.  Dispense based on patient and insurance preference., Disp: 100 strip, Rfl: 3   Lancet Device MISC, Check BGs once daily. E11.9.  Dispense based on patient and insurance preference., Disp: 1 each, Rfl: 0   Lancets MISC, Check BGs once daily. E11.9.  Dispense based on patient and insurance preference., Disp: 100 each, Rfl: 3   losartan  (COZAAR ) 25 MG tablet, Take 1 tablet (25 mg total) by  mouth 2 (two) times daily., Disp: 200 tablet, Rfl: 3   mometasone  (NASONEX ) 50 MCG/ACT nasal spray, PLACE 2 SPRAYS INTO THE NOSE DAILY., Disp: 17 each, Rfl: 5   nitroGLYCERIN  (NITROSTAT ) 0.4 MG SL tablet, Place 1 tablet (0.4 mg total) under the tongue every 5 (five) minutes as needed for chest pain (x 3 doses). Reported on 12/05/2015, Disp: 25 tablet, Rfl: 1   omeprazole  (PRILOSEC) 20 MG capsule, Take 1 capsule (20 mg total) by mouth daily., Disp: 100 capsule, Rfl: 3   potassium chloride  (KLOR-CON ) 10 MEQ tablet, Take 1 tablet (10 mEq total) by mouth daily., Disp: 100 tablet, Rfl: 3   Semaglutide , 1 MG/DOSE, 4 MG/3ML SOPN, Inject 1 mg as directed once a week. DX:E11.9, Disp: 9 mL, Rfl: 5   spironolactone  (ALDACTONE ) 25 MG tablet, Take 0.5 tablets (12.5 mg total) by mouth 2 (two) times daily., Disp: 100 tablet, Rfl: 3   sulfamethoxazole -trimethoprim  (BACTRIM  DS) 800-160 MG tablet, Take 1 tablet by mouth 2 (two) times daily for 5 days., Disp: 10 tablet, Rfl: 0 Social History   Socioeconomic History   Marital status: Married    Spouse name: Beryl   Number of children: 3   Years of education: Not on file   Highest education level: Not on file  Occupational History   Occupation: hairdresser  Tobacco Use   Smoking status: Never   Smokeless tobacco: Never  Vaping Use   Vaping status: Never Used  Substance and Sexual Activity   Alcohol  use: No   Drug use: No   Sexual activity: Not Currently    Birth control/protection: Post-menopausal  Other Topics Concern   Not on file  Social History Narrative   Lives with husband, Beryl.    1 son died in 2018-08-04 from ALS.    Social Drivers of Corporate Investment Banker Strain: Low Risk  (09/17/2023)   Overall Financial Resource Strain (CARDIA)    Difficulty of Paying Living Expenses: Not hard at all  Food Insecurity: No Food Insecurity (09/17/2023)   Hunger Vital Sign    Worried About Running Out of Food in the Last Year: Never true    Ran Out of Food  in the Last Year: Never true  Transportation Needs: No Transportation Needs (09/17/2023)   PRAPARE - Administrator, Civil Service (Medical): No    Lack of Transportation (Non-Medical): No  Physical Activity: Insufficiently Active (09/17/2023)   Exercise Vital Sign    Days of Exercise per Week: 3 days  Minutes of Exercise per Session: 30 min  Stress: No Stress Concern Present (09/17/2023)   [employer] of Occupational Health - Occupational Stress Questionnaire    Feeling of Stress : Not at all  Social Connections: Socially Integrated (09/17/2023)   Social Connection and Isolation Panel    Frequency of Communication with Friends and Family: More than three times a week    Frequency of Social Gatherings with Friends and Family: Three times a week    Attends Religious Services: More than 4 times per year    Active Member of Clubs or Organizations: Yes    Attends Banker Meetings: More than 4 times per year    Marital Status: Married  Catering Manager Violence: Not At Risk (09/17/2023)   Humiliation, Afraid, Rape, and Kick questionnaire    Fear of Current or Ex-Partner: No    Emotionally Abused: No    Physically Abused: No    Sexually Abused: No   Family History  Problem Relation Age of Onset   Stroke Mother    Hypertension Mother    Heart failure Mother    Breast cancer Mother    Aneurysm Father    Seizures Brother    Muscular dystrophy Daughter        dx as infant, passed age 5   Seizures Son    ALS Son    Colon cancer Neg Hx    Esophageal cancer Neg Hx    Rectal cancer Neg Hx    Stomach cancer Neg Hx     Objective: Office vital signs reviewed. BP 125/67   Pulse 73   Temp 98 F (36.7 C)   Ht 5' 4 (1.626 m)   Wt 214 lb 6 oz (97.2 kg)   SpO2 95%   BMI 36.80 kg/m   Physical Examination:  General: Awake, alert, well-appearing, nontoxic obese female, No acute distress Breast: Breasts are pendulous.  She has no nipple inversion or skin  changes.  No axillary lymphadenopathy.  She has multiple, well-circumscribed and mobile firm/calcified knots that are approximately pea and maybe being in size throughout the right breast (with predominance in the upper quadrants) and some along the left breast.  Assessment/ Plan: 78 y.o. female   Mass overlapping multiple quadrants of right breast - Plan: MM 3D DIAGNOSTIC MAMMOGRAM BILATERAL BREAST, US  LIMITED ULTRASOUND INCLUDING AXILLA LEFT BREAST , US  LIMITED ULTRASOUND INCLUDING AXILLA RIGHT BREAST  Abnormal urinalysis - Plan: Urinalysis, Routine w reflex microscopic, Urine Culture, CMP14+EGFR   Possibly calcifications.  Does not seem consistent with fibrocystic changes.  Diagnostic mammogram ordered stat.  I communicated this to Melissa to ensure that her appointment tomorrow was canceled.  Further interventions pending results  Additionally, she was seen by my partner recently and noted to have an abnormal urinalysis with urobilinogen.  I have placed orders to follow-up on these findings   Bilan Tedesco CHRISTELLA Fielding, DO [facility] [phone]

## 2024-06-30 ENCOUNTER — Ambulatory Visit: Payer: Self-pay | Admitting: Family Medicine

## 2024-06-30 ENCOUNTER — Ambulatory Visit

## 2024-07-01 LAB — URINE CULTURE

## 2024-07-03 ENCOUNTER — Telehealth: Payer: Self-pay

## 2024-07-03 MED ORDER — CEPHALEXIN 500 MG PO CAPS: 500.0000 mg | ORAL_CAPSULE | Freq: Three times a day (TID) | ORAL | 0 refills | Status: AC

## 2024-07-03 NOTE — Telephone Encounter (Signed)
 Patient aware of lab results, lab appointment scheduled

## 2024-07-03 NOTE — Telephone Encounter (Signed)
 Copied from CRM #[id number]. Topic: Clinical - Lab/Test Results >> Jul 03, 2024  3:32 PM Delon DASEN wrote: Reason for CRM: gave results verbatim, and schedule urine test for 2 weeks.

## 2024-07-16 ENCOUNTER — Other Ambulatory Visit: Payer: Self-pay | Admitting: Family Medicine

## 2024-07-16 DIAGNOSIS — J302 Other seasonal allergic rhinitis: Secondary | ICD-10-CM

## 2024-07-17 ENCOUNTER — Other Ambulatory Visit

## 2024-07-17 DIAGNOSIS — N3000 Acute cystitis without hematuria: Secondary | ICD-10-CM

## 2024-07-27 ENCOUNTER — Telehealth: Payer: Self-pay | Admitting: *Deleted

## 2024-07-27 NOTE — Telephone Encounter (Signed)
 Copied from CRM [id number]. Topic: Clinical - Lab/Test Results >> Jul 27, 2024  1:49 PM Terri G wrote: Reason for CRM: Patient returning call for lab results. Callback number [phone]

## 2024-07-27 NOTE — Telephone Encounter (Signed)
 Addressed with pt. LS

## 2024-07-28 ENCOUNTER — Other Ambulatory Visit

## 2024-07-28 DIAGNOSIS — E119 Type 2 diabetes mellitus without complications: Secondary | ICD-10-CM

## 2024-07-28 DIAGNOSIS — R8290 Unspecified abnormal findings in urine: Secondary | ICD-10-CM

## 2024-07-28 DIAGNOSIS — N6325 Unspecified lump in the left breast, overlapping quadrants: Secondary | ICD-10-CM

## 2024-07-28 DIAGNOSIS — Z7985 Long-term (current) use of injectable non-insulin antidiabetic drugs: Secondary | ICD-10-CM | POA: Diagnosis not present

## 2024-07-28 DIAGNOSIS — N6315 Unspecified lump in the right breast, overlapping quadrants: Secondary | ICD-10-CM

## 2024-07-28 DIAGNOSIS — E782 Mixed hyperlipidemia: Secondary | ICD-10-CM

## 2024-07-28 DIAGNOSIS — G720 Drug-induced myopathy: Secondary | ICD-10-CM

## 2024-07-28 MED ORDER — TIRZEPATIDE 5 MG/0.5ML ~~LOC~~ SOAJ: 5.0000 mg | SUBCUTANEOUS | 0 refills | Status: AC

## 2024-07-28 NOTE — Progress Notes (Signed)
 "  07/28/2024 Name: Sara [name] MRN: [id number] DOB: 1946/02/10  Chief Complaint  Patient presents with   Diabetes    Sara [name] is a 79 y.o. year old female who was referred for medication management by their primary care provider, Jolinda Potter M, DO. They presented for a face to face visit today. They were referred to the pharmacist by their PCP for assistance in managing diabetes, hyperlipidemia/cardiovascular risk reduction, and medication access    Subjective: [name] presents for her in-person appointment for medication access, diabetes, and hyperlipidemia follow-up. Patient brought in paperwork stating that she qualifies for [employer], LIS application completed last month. Letter received in mail stating she was accepted for LIS. She will now be able to obtain all medication at the local pharmacy with capped affordable copays.  She is due for labs but has a PCP appointment next week. Will obtain labs then. Counseled on fasting prior to appointment.   Care Team: Primary Care Provider: Jolinda Potter HERO, DO Next Scheduled Visit: 10/06/2024  Medication Access/Adherence Current Pharmacy:  [id number] - [place], [place] - [address] [place] [address] Phone: [phone] Fax: [phone]  [facility] [address] [place] - [address] [place] [address] Phone: [phone] Fax: [phone]   Patient reports affordability concerns with their medications: past but now recently approved for LIS/extra help Patient reports access/transportation concerns to their pharmacy: No  Patient reports adherence concerns with their medications:  No    Diabetes: Current medications: Ozempic  1 mg weekly-->Mounjaro  5mg  Medications tried in the past: metformin Statin: Repatha  LDL of 72 -->66 (2025) ACEi/ARB: losartan  UACR of <9 on 12/23/23  A1c of 5.2% on 12/23/23  Current glucose readings: not checking sugars at home    Patient denies hypoglycemic s/sx including dizziness, shakiness, sweating. Patient denies hyperglycemic symptoms including polyuria, polydipsia, polyphagia, nocturia, neuropathy, blurred vision.  Current meal patterns: Discussed meal planning options and Plate method for healthy eating Avoid sugary drinks and desserts Incorporate balanced protein, non starchy veggies, 1 serving of carbohydrate with each meal Increase water  intake Increase physical activity as able    Hyperlipidemia/ASCVD Risk Reduction Current lipid lowering medications: Repatha  140 mg every 2 weeks Medications tried in the past: atorvastatin , rosuvastatin  (myalgias) Antiplatelet regimen: aspirin  81 mg ASCVD History: history of MI  Current medication access support:  [employer] LIS   Objective: Lab Results  Component Value Date   HGBA1C 5.3 06/23/2024   Lab Results  Component Value Date   CREATININE 0.94 06/29/2024   BUN 12 06/29/2024   NA 136 06/29/2024   K 4.5 06/29/2024   CL 106 06/29/2024   CO2 20 06/29/2024   Lab Results  Component Value Date   CHOL 133 06/23/2024   HDL 56 06/23/2024   LDLCALC 66 06/23/2024   TRIG 51 06/23/2024   CHOLHDL 2.4 06/23/2024   Medications Reviewed Today     Reviewed by Billee Mliss BIRCH, RPH-CPP (Pharmacist) on 07/28/24 at 1420  Med List Status: <None>   Medication Order Taking? Sig Documenting Provider Last Dose Status Informant  albuterol  (VENTOLIN  HFA) 108 (90 Base) MCG/ACT inhaler [id number]  TAKE 2 PUFFS BY MOUTH EVERY 6 HOURS AS NEEDED FOR WHEEZE OR SHORTNESS OF BREATH Jolinda Potter M, DO  Active   alendronate  (FOSAMAX ) 70 MG tablet [id number]  TAKE 1 TABLET EVERY 7 DAYS WITH A FULL GLASS OF WATER  ON AN EMPTY STOMACH Jolinda Potter M, DO  Active   aspirin  EC  81 MG tablet [id number]  Take 81 mg by mouth daily. [provider]  Active Self  Blood Glucose Monitoring Suppl DEVI [id number]  Check BGs once daily. E11.9.  Dispense based on patient and  insurance preference. Jolinda Potter M, DO  Active   budesonide -formoterol  (SYMBICORT ) 80-4.5 MCG/ACT inhaler [id number]  Inhale 2 puffs into the lungs 2 (two) times daily.  Patient not taking: Reported on 06/29/2024   Jolinda Potter HERO, DO  Active Self           Med Note SOUNDRA BOBETTA JINNY Pablo May 18, 2024 11:43 AM) Emily as needed  Calcium  Carbonate-Vitamin D  (SUPER CALCIUM  600 + D3 PO) [id number]  Take 1 tablet by mouth daily. [provider]  Active Self  diltiazem  (CARDIZEM  CD) 120 MG 24 hr capsule [id number]  TAKE 1 CAPSULE BY MOUTH EVERY DAY Gottschalk, Ashly M, DO  Active   Evolocumab  (REPATHA  SURECLICK) 140 MG/ML SOAJ [id number]  Inject 140 mg into the skin every 14 (fourteen) days. Jolinda Potter M, DO  Active   furosemide  (LASIX ) 20 MG tablet [id number]  Take 1 tablet (20 mg total) by mouth daily. Jolinda Potter M, DO  Active   Glucose Blood (BLOOD GLUCOSE TEST STRIPS) STRP [id number]  Check BGs once daily. E11.9.  Dispense based on patient and insurance preference. Jolinda Potter HERO, DO  Active   Lancet Device MISC [id number]  Check BGs once daily. E11.9.  Dispense based on patient and insurance preference. Jolinda Potter HERO, DO  Active   Lancets MISC [id number]  Check BGs once daily. E11.9.  Dispense based on patient and insurance preference. Jolinda Potter M, DO  Active   losartan  (COZAAR ) 25 MG tablet [id number]  Take 1 tablet (25 mg total) by mouth 2 (two) times daily. Jolinda Potter M, DO  Active   mometasone  (NASONEX ) 50 MCG/ACT nasal spray [id number]  PLACE 2 SPRAYS INTO THE NOSE DAILY. Jolinda Potter M, DO  Active   nitroGLYCERIN  (NITROSTAT ) 0.4 MG SL tablet [id number]  Place 1 tablet (0.4 mg total) under the tongue every 5 (five) minutes as needed for chest pain (x 3 doses). Reported on 12/05/2015 Jolinda Potter HERO, DO  Active            Med Note SOUNDRA, BOBETTA JINNY Pablo May 18, 2024 11:45 AM) Never used need refill  omeprazole  (PRILOSEC) 20 MG capsule  [id number]  Take 1 capsule (20 mg total) by mouth daily. Jolinda Potter M, DO  Active   potassium chloride  (KLOR-CON ) 10 MEQ tablet [id number]  Take 1 tablet (10 mEq total) by mouth daily. Jolinda Potter M, DO  Active   Semaglutide , 1 MG/DOSE, 4 MG/3ML SOPN [id number]  Inject 1 mg as directed once a week. DX:E11.9 Jolinda Potter M, DO  Active   spironolactone  (ALDACTONE ) 25 MG tablet [id number]  Take 0.5 tablets (12.5 mg total) by mouth 2 (two) times daily. Jolinda Potter M, DO  Active             Assessment/Plan:  Diabetes: Currently controlled. Goal of <7% Cardiorenal risk reduction -->opportunities for improvement.  Blood pressure is at goal of <130/80 mmHg Today's BP of 111/74 LDL is not at goal of <55 mg/dL, but improved, will continue current management Reviewed long term cardiovascular and renal outcomes of uncontrolled blood sugar Reviewed goal A1c, goal fasting, and goal 2 hour post prandial glucose Reviewed dietary modifications, encouraged to increase hydration, optimally 4-5 glasses per day Recommend to:  Continue Ozempic  1 mg weekly  [facility] Will transition to Mounjaro  when supply Ozempic  complete Future Considerations:  Potential for Farxiga 10 mg to optimize cardiorenal protection  Patient denies personal or family history of multiple endocrine neoplasia type 2, medullary thyroid  cancer; personal history of pancreatitis or gallbladder disease., Discussed side effects of gastrointestinal upset/nausea; eating smaller meals, avoiding high-fat foods, and remaining upright after eating may reduce nausea. Discussed that overeating is a major trigger of nausea with this class of medications, as often times patients will start to feel full sooner and may need to decrease portion sizes from what they were previously accustomed to.   Hyperlipidemia/ASCVD Risk Reduction: Currently uncontrolled. Goal <55 mg/dL due to history of ASCVD (MI) Reviewed long term  complications of uncontrolled cholesterol Recommend to:  Continue Repatha   No longer requires [facility] due to approval for LIS Patient to get updated lipid panel next week at PCP visits G72 for statin-induce myopathies dropped for this visit   Follow Up Plan:  PharmD and PCP in March 2026   Mliss Tarry Griffin, PharmD, BCACP, CPP Clinical Pharmacist, [facility]  "

## 2024-07-29 ENCOUNTER — Other Ambulatory Visit

## 2024-07-30 LAB — URINALYSIS, ROUTINE W REFLEX MICROSCOPIC
Bilirubin, UA: NEGATIVE
Glucose, UA: NEGATIVE
Ketones, UA: NEGATIVE
Leukocytes,UA: NEGATIVE
Nitrite, UA: NEGATIVE
Protein,UA: NEGATIVE
RBC, UA: NEGATIVE
Urobilinogen, Ur: 0.2 mg/dL (ref 0.2–1.0)
pH, UA: 5.5 (ref 5.0–7.5)

## 2024-07-31 ENCOUNTER — Ambulatory Visit: Payer: Self-pay | Admitting: Family Medicine

## 2024-07-31 NOTE — Telephone Encounter (Unsigned)
 Copied from CRM #[id number]. Topic: Clinical - Lab/Test Results >> Jul 31, 2024  2:33 PM Avram MATSU wrote: Reason for CRM: I relayed test results to PT

## 2024-08-03 ENCOUNTER — Inpatient Hospital Stay: Admission: RE | Admit: 2024-08-03 | Source: Ambulatory Visit

## 2024-08-03 ENCOUNTER — Other Ambulatory Visit

## 2024-08-03 NOTE — Progress Notes (Unsigned)
 "  Cardiology Office Note    Date:  08/06/2024  ID:  Sara [name], Sara [name] July 03, 1946, MRN [id number] PCP:  Jolinda Norene HERO, DO  Cardiologist:  Lynwood Schilling, MD  Electrophysiologist:  None   Chief Complaint: Follow up for syncope   History of Present Illness: .   Sara [name] is a 79 y.o. female with visit-pertinent history of CAD, AVNRT s/p ablation, hypertension and hyperlipidemia.  In 2010 patient had PCI to the LAD.  In 2012 she was admitted to [facility] with SVT with associated chest pain and elevated troponin.  Cath in 2012 showed patent LAD stents.  In 2013 she underwent AVN RT ablation.  At this any Cardiolite  in 11/2012 with no evidence of ischemia or infarction.  Right/left heart catheterization in 2015 in setting of ongoing dyspnea on exertion showed normal filling pressures, no pulmonary hypertension and no obstructive CAD.  Last ischemic evaluation in 2018 was low risk with no evidence of ischemia.  Patient was last seen in clinic by Dr. Schilling on 05/18/2024 for ED follow-up with neck pain.  There was no evidence of ischemia.  Patient reported show to the ED as her jugular vein was pulsating.  Patient denied any particular tachypalpitations and was not otherwise feeling poorly.  Reportedly she had passed out a couple of days later while standing in the shower, felt water  was too hot, she then fell hitting her head and shoulder slightly.  In office she was not orthostatic, suspected that she had a vagal episode, 2-week monitor was ordered for further evaluation.  Cardiac monitor indicated predominant rhythm was normal sinus rhythm, average heart rate of 73 bpm ranging from 54 to 156 bpm.  She had 8 episodes of supraventricular tachycardia, run with a fast interval lasting 8 beats with a max rate of 156 bpm, longest lasting 16 beats with an average rate of 101 bpm.  Today she presents for follow-up.  She reports that she has been doing well overall.  She denies any  chest pain, increased shortness of breath, lower extremity edema, orthopnea or PND.  She notes some baseline palpitations described as skipped beat that are chronic and unchanged per patient.  She notes that a month ago she had an episode of presyncope while going to the bathroom, she became significantly nauseated and lightheaded, placed a cold washcloth on her forehead and called 911, patient reports she did not fully lose consciousness.  She is found to have low blood pressure and felt to be dehydrated, patient reports that she has increased her hydration without any recurrence.  She was seen by her PCP with reassuring lab work.  Labwork independently reviewed: 06/29/2024: Sodium 136, potassium 4.5, creatinine 0.94, AST 20, ALT 11 ROS: .   Today she denies chest pain, shortness of breath, lower extremity edema, fatigue, palpitations, melena, hematuria, hemoptysis, diaphoresis, weakness, orthopnea, and PND.  All other systems are reviewed and otherwise negative. Studies Reviewed: [name]    EKG:  EKG is not ordered today  CV Studies: Cardiac studies reviewed are outlined and summarized above. Otherwise please see EMR for full report. Cardiac Studies & Procedures   ______________________________________________________________________________________________   STRESS TESTS  MYOCARDIAL PERFUSION IMAGING 07/25/2016  Interpretation Summary  Nuclear stress EF: 65%.  There was no ST segment deviation noted during stress.  This is a low risk study.  The left ventricular ejection fraction is normal (55-65%).  1. Normal LV systolic function and wall motion. 2. Fixed medium-sized, mild apical perfusion defect.  Fixed small, mild basal inferolateral perfusion defect.  It is possible that these are areas of infarction, however given normal wall motion, suspect that attenuation artifact is more likely.  No ischemia. 3. Overall low risk study given normal EF and lack of ischemia.      MONITORS  LONG  TERM MONITOR (3-14 DAYS) 06/09/2024  Narrative Predominant rhythm was normal sinus Occasional runs of supraventricular tachycardia with the longest run being 8 beats Occasional isolated premature ventricular beats No sustained arrhythmias       ______________________________________________________________________________________________       Current Reported Medications:.    Active Medications[1]  Physical Exam:    VS:  BP 124/68   Pulse 75   Ht 5' 4 (1.626 m)   Wt 213 lb (96.6 kg)   SpO2 96%   BMI 36.56 kg/m    Wt Readings from Last 3 Encounters:  08/06/24 213 lb (96.6 kg)  06/29/24 214 lb 6 oz (97.2 kg)  06/25/24 214 lb (97.1 kg)    GEN: Well nourished, well developed in no acute distress NECK: No JVD; No carotid bruits CARDIAC: RRR, no murmurs, rubs, gallops RESPIRATORY:  Clear to auscultation without rales, wheezing or rhonchi  ABDOMEN: Soft, non-tender, non-distended EXTREMITIES:  No edema; No acute deformity     Asessement and Plan:[name]    Syncope: Patient reported episode of syncope in October while standing in the shower, felt that the water  was too hot and fell, at office visit she was not orthostatic and was suspected that episode was vagal in nature.  2-week cardiac monitor was reassuring without evidence of arrhythmia that would result in syncope.  Patient reports that she has not had any further syncope, does note an episode of presyncope over a month ago while she was sitting on her toilet and became significantly nauseated and lightheaded, denied any increased palpitations.  Patient reports that she did not lose consciousness, they did call for EMS, was found to have low blood pressure and was felt to be dehydrated.  Patient reports she is increased her hydration following without any recurrence, overall sounds vagal in nature.  Encouraged patient to remain well-hydrated.  Will also check echocardiogram and carotid Dopplers for completeness.  Reviewed ED  precautions.  Patient is aware of driving restriction for 6 months following if syncopal episode.  CAD: Patient with PCI to the LAD in 2010. Stable with no anginal symptoms. No indication for ischemic evaluation.  Heart healthy diet and regular cardiovascular exercise encouraged.  Reviewed ED precautions.  Continue aspirin  81 mg daily, diltiazem  120 mg daily, Repatha , losartan  25 mg twice daily, spironolactone  12.5 mg twice daily.  AVNRT: S/p ablation in 2013.  Recent cardiac monitor reassuring, she denies any prolonged tachypalpitations.  Notes an occasional skipped beat that she reports is stable and unchanged.  Continue diltiazem  120 mg daily.  Hypertension: Blood pressure today 124/68.  Continue current antihypertensive regimen.  Hyperlipidemia: Last lipid profile on 06/23/2024 indicated total cholesterol 133, HDL 56, triglycerides 51 and LDL 66.  Continue Repatha .   Disposition: F/u with Rosamond Andress, NP in three months or sooner if needed.   Signed, Marriah Sanderlin D Lautaro Koral, NP       [1]  Current Meds  Medication Sig   albuterol  (VENTOLIN  HFA) 108 (90 Base) MCG/ACT inhaler TAKE 2 PUFFS BY MOUTH EVERY 6 HOURS AS NEEDED FOR WHEEZE OR SHORTNESS OF BREATH   alendronate  (FOSAMAX ) 70 MG tablet TAKE 1 TABLET EVERY 7 DAYS WITH A FULL GLASS OF WATER  ON  AN EMPTY STOMACH   aspirin  EC 81 MG tablet Take 81 mg by mouth daily.   Blood Glucose Monitoring Suppl DEVI Check BGs once daily. E11.9.  Dispense based on patient and insurance preference.   budesonide -formoterol  (SYMBICORT ) 80-4.5 MCG/ACT inhaler Inhale 2 puffs into the lungs 2 (two) times daily.   Calcium  Carbonate-Vitamin D  (SUPER CALCIUM  600 + D3 PO) Take 1 tablet by mouth daily.   diltiazem  (CARDIZEM  CD) 120 MG 24 hr capsule TAKE 1 CAPSULE BY MOUTH EVERY DAY   Evolocumab  (REPATHA  SURECLICK) 140 MG/ML SOAJ Inject 140 mg into the skin every 14 (fourteen) days.   Glucose Blood (BLOOD GLUCOSE TEST STRIPS) STRP Check BGs once daily. E11.9.  Dispense based  on patient and insurance preference.   Lancet Device MISC Check BGs once daily. E11.9.  Dispense based on patient and insurance preference.   Lancets MISC Check BGs once daily. E11.9.  Dispense based on patient and insurance preference.   losartan  (COZAAR ) 25 MG tablet Take 1 tablet (25 mg total) by mouth 2 (two) times daily.   mometasone  (NASONEX ) 50 MCG/ACT nasal spray PLACE 2 SPRAYS INTO THE NOSE DAILY.   nitroGLYCERIN  (NITROSTAT ) 0.4 MG SL tablet Place 1 tablet (0.4 mg total) under the tongue every 5 (five) minutes as needed for chest pain (x 3 doses). Reported on 12/05/2015   omeprazole  (PRILOSEC) 20 MG capsule Take 1 capsule (20 mg total) by mouth daily.   potassium chloride  (KLOR-CON ) 10 MEQ tablet Take 1 tablet (10 mEq total) by mouth daily.   spironolactone  (ALDACTONE ) 25 MG tablet Take 0.5 tablets (12.5 mg total) by mouth 2 (two) times daily.   tirzepatide  (MOUNJARO ) 5 MG/0.5ML Pen Inject 5 mg into the skin once a week. DX: E11.9   "

## 2024-08-06 ENCOUNTER — Ambulatory Visit: Attending: Cardiology | Admitting: Cardiology

## 2024-08-06 ENCOUNTER — Encounter: Payer: Self-pay | Admitting: Cardiology

## 2024-08-06 VITALS — BP 124/68 | HR 75 | Ht 64.0 in | Wt 213.0 lb

## 2024-08-06 DIAGNOSIS — E785 Hyperlipidemia, unspecified: Secondary | ICD-10-CM | POA: Diagnosis not present

## 2024-08-06 DIAGNOSIS — I10 Essential (primary) hypertension: Secondary | ICD-10-CM

## 2024-08-06 DIAGNOSIS — I2510 Atherosclerotic heart disease of native coronary artery without angina pectoris: Secondary | ICD-10-CM

## 2024-08-06 DIAGNOSIS — I4719 Other supraventricular tachycardia: Secondary | ICD-10-CM | POA: Diagnosis not present

## 2024-08-06 DIAGNOSIS — R55 Syncope and collapse: Secondary | ICD-10-CM

## 2024-08-06 NOTE — Patient Instructions (Addendum)
 Medication Instructions:  Your physician recommends that you continue on your current medications as directed. Please refer to the Current Medication list given to you today.  *If you need a refill on your cardiac medications before your next appointment, please call your pharmacy*  Lab Work: NONE If you have labs (blood work) drawn today and your tests are completely normal, you will receive your results only by: MyChart Message (if you have MyChart) OR A paper copy in the mail If you have any lab test that is abnormal or we need to change your treatment, we will call you to review the results.  Testing/Procedures: Your physician has requested that you have an echocardiogram. Echocardiography is a painless test that uses sound waves to create images of your heart. It provides your doctor with information about the size and shape of your heart and how well your hearts chambers and valves are working. This procedure takes approximately one hour. There are no restrictions for this procedure. Please do NOT wear cologne, perfume, aftershave, or lotions (deodorant is allowed). Please arrive 15 minutes prior to your appointment time.  Please note: We ask at that you not bring children with you during ultrasound (echo/ vascular) testing. Due to room size and safety concerns, children are not allowed in the ultrasound rooms during exams. Our front office staff cannot provide observation of children in our lobby area while testing is being conducted. An adult accompanying a patient to their appointment will only be allowed in the ultrasound room at the discretion of the ultrasound technician under special circumstances. We apologize for any inconvenience.   Follow-Up: At [facility], you and your health needs are our priority.  As part of our continuing mission to provide you with exceptional heart care, our providers are all part of one team.  This team includes your primary Cardiologist  (physician) and Advanced Practice Providers or APPs (Physician Assistants and Nurse Practitioners) who all work together to provide you with the care you need, when you need it.  Your next appointment:   3 month(s)  Provider:   Katlyn West, NP  We recommend signing up for the patient portal called MyChart.  Sign up information is provided on this After Visit Summary.  MyChart is used to connect with patients for Virtual Visits (Telemedicine).  Patients are able to view lab/test results, encounter notes, upcoming appointments, etc.  Non-urgent messages can be sent to your provider as well.   To learn more about what you can do with MyChart, go to [email or web address].   Other Instructions Your physician has requested that you have a carotid duplex. This test is an ultrasound of the carotid arteries in your neck. It looks at blood flow through these arteries that supply the brain with blood. Allow one hour for this exam. There are no restrictions or special instructions.

## 2024-08-07 ENCOUNTER — Ambulatory Visit
Admission: RE | Admit: 2024-08-07 | Discharge: 2024-08-07 | Disposition: A | Discharge status: Home / Self Care | Source: Ambulatory Visit | Attending: Family Medicine | Admitting: Family Medicine

## 2024-08-07 ENCOUNTER — Ambulatory Visit: Payer: Self-pay | Admitting: Family Medicine

## 2024-08-07 DIAGNOSIS — N6325 Unspecified lump in the left breast, overlapping quadrants: Secondary | ICD-10-CM

## 2024-08-07 DIAGNOSIS — N6315 Unspecified lump in the right breast, overlapping quadrants: Secondary | ICD-10-CM

## 2024-08-10 NOTE — Telephone Encounter (Signed)
 Patient now has LIS/extra help She will be able to transition to Mounjaro  and able to afford repatha  as well PharmD visit on 08/18/24

## 2024-08-12 ENCOUNTER — Other Ambulatory Visit: Payer: Self-pay | Admitting: Family Medicine

## 2024-08-12 DIAGNOSIS — I152 Hypertension secondary to endocrine disorders: Secondary | ICD-10-CM

## 2024-08-17 NOTE — Progress Notes (Unsigned)
 "  08/18/2024 Name: Sara [name] MRN: [id number] DOB: 21-Dec-1945  Chief Complaint  Patient presents with   Diabetes   Sara [name] is a 79 y.o. year old female who presented for a telephone visit. They were referred to the pharmacist by their PCP for assistance in managing diabetes, hyperlipidemia/cardiovascular risk reduction, and medication access.   Subjective:  Patient reports for her telephone visit for diabetes management. Patient is doing well overall. Confirms diabetes medications. Endorses having 2-3 more weeks of Ozempic  left. She has not started Mounjaro  5 mg due to finishing up current Ozempic  supply. Discussed how to use Mounjaro  pen and explained differences between Ozempic  vs Mounjaro  pen.  Care Team: Primary Care Provider: Jolinda Norene HERO, DO ; Next Scheduled Visit: 10/06/24 Cardiologist: West, Katlyn D, NP; Next Scheduled Visit: 11/03/24  Medication Access/Adherence  Current Pharmacy:  [id number] - [place], [place] - [address] [place] [address] Phone: [phone] Fax: [phone]  [facility] [address], [place] - [id number] [place] [address] Phone: [phone] Fax: [phone]   Patient reports affordability concerns with their medications: No  Patient reports access/transportation concerns to their pharmacy: No  Patient reports adherence concerns with their medications:  No    Diabetes: Current medications: Mounjaro  5 mg weekly Medications tried in the past: Ozempic  (cost) Statin:  Currently on Repatha  Statin intolerance LDL of 66 on 06/23/24 ACEi/ARB Losartan  25 mg daily UACR of <9 on 12/23/23  A1c of 5.3% on 06/23/24, up from 5.2% on 12/23/23  Patient denies hypoglycemic s/sx including dizziness, shakiness, sweating.  Patient denies hyperglycemic symptoms including polyuria, polydipsia, polyphagia, nocturia, neuropathy, blurred vision.  Current meal patterns: Discussed meal  planning options and Plate method for healthy eating Avoid sugary drinks and desserts Incorporate balanced protein, non starchy veggies, 1 serving of carbohydrate with each meal Increase water  intake Increase physical activity as able   Hyperlipidemia/ASCVD Risk Reduction Current lipid lowering medications: Repatha  140 mg every 2 weeks Medications tried in the past: atorvastatin , rosuvastatin  (myalgias) Antiplatelet regimen: aspirin  81 mg ASCVD History: history of MI  Current medication access support:  [employer] LIS/Extra help  Objective:  Lab Results  Component Value Date   HGBA1C 5.3 06/23/2024    Lab Results  Component Value Date   CREATININE 0.94 06/29/2024   BUN 12 06/29/2024   NA 136 06/29/2024   K 4.5 06/29/2024   CL 106 06/29/2024   CO2 20 06/29/2024    Lab Results  Component Value Date   CHOL 133 06/23/2024   HDL 56 06/23/2024   LDLCALC 66 06/23/2024   TRIG 51 06/23/2024   CHOLHDL 2.4 06/23/2024    Medications Reviewed Today     Reviewed by Mamie Jenkins HERO, RPH (Pharmacist) on 08/18/24 at [phone]  Med List Status: <None>   Medication Order Taking? Sig Documenting Provider Last Dose Status Informant  albuterol  (VENTOLIN  HFA) 108 (90 Base) MCG/ACT inhaler [id number] Yes TAKE 2 PUFFS BY MOUTH EVERY 6 HOURS AS NEEDED FOR WHEEZE OR SHORTNESS OF BREATH Jolinda Norene M, DO  Active   alendronate  (FOSAMAX ) 70 MG tablet [id number] Yes TAKE 1 TABLET EVERY 7 DAYS WITH A FULL GLASS OF WATER  ON AN EMPTY STOMACH Jolinda Norene HERO, DO  Active   aspirin  EC 81 MG tablet [id number] Yes Take 81 mg by mouth daily. [provider]  Active Self  Blood Glucose Monitoring Suppl DEVI [id number]  Check BGs once daily. E11.9.  Dispense based  on patient and insurance preference. Jolinda Potter M, DO  Active   budesonide -formoterol  (SYMBICORT ) 80-4.5 MCG/ACT inhaler [id number] Yes Inhale 2 puffs into the lungs 2 (two) times daily. Jolinda Potter HERO, DO  Active Self            Med Note SOUNDRA BOBETTA JINNY Pablo May 18, 2024 11:43 AM) Takes as needed  Calcium  Carbonate-Vitamin D  (SUPER CALCIUM  600 + D3 PO) [id number] Yes Take 1 tablet by mouth daily. [provider]  Active Self  diltiazem  (CARDIZEM  CD) 120 MG 24 hr capsule [id number] Yes TAKE 1 CAPSULE BY MOUTH EVERY DAY Gottschalk, Ashly M, DO  Active   Evolocumab  (REPATHA  SURECLICK) 140 MG/ML SOAJ [id number] Yes Inject 140 mg into the skin every 14 (fourteen) days. Jolinda Potter M, DO  Active   furosemide  (LASIX ) 20 MG tablet [id number] Yes Take 1 tablet (20 mg total) by mouth daily. Jolinda Potter M, DO  Active   Glucose Blood (BLOOD GLUCOSE TEST STRIPS) STRP [id number]  Check BGs once daily. E11.9.  Dispense based on patient and insurance preference. Jolinda Potter HERO, DO  Active   Lancet Device MISC [id number]  Check BGs once daily. E11.9.  Dispense based on patient and insurance preference. Jolinda Potter HERO, DO  Active   Lancets MISC [id number]  Check BGs once daily. E11.9.  Dispense based on patient and insurance preference. Jolinda Potter M, DO  Active   losartan  (COZAAR ) 25 MG tablet [id number] Yes Take 1 tablet (25 mg total) by mouth 2 (two) times daily. Jolinda Potter M, DO  Active   mometasone  (NASONEX ) 50 MCG/ACT nasal spray [id number] Yes PLACE 2 SPRAYS INTO THE NOSE DAILY. Jolinda Potter M, DO  Active   nitroGLYCERIN  (NITROSTAT ) 0.4 MG SL tablet [id number] Yes Place 1 tablet (0.4 mg total) under the tongue every 5 (five) minutes as needed for chest pain (x 3 doses). Reported on 12/05/2015 Jolinda Potter HERO, DO  Active            Med Note SOUNDRA BOBETTA JINNY Pablo May 18, 2024 11:45 AM) Never used need refill  omeprazole  (PRILOSEC) 20 MG capsule [id number] Yes Take 1 capsule (20 mg total) by mouth daily. Jolinda Potter M, DO  Active   potassium chloride  (KLOR-CON ) 10 MEQ tablet [id number] Yes Take 1 tablet (10 mEq total) by mouth daily. Jolinda Potter M, DO  Active   spironolactone   (ALDACTONE ) 25 MG tablet [id number] Yes Take 0.5 tablets (12.5 mg total) by mouth 2 (two) times daily. Jolinda Potter M, DO  Active   tirzepatide  (MOUNJARO ) 5 MG/0.5ML Pen [id number] Yes Inject 5 mg into the skin once a week. DX: E11.9 Jolinda Potter HERO, DO  Active             Assessment/Plan:  Diabetes: Currently controlled. Goal of <7% Cardiorenal risk reduction is is optimized. Blood pressure is at goal of <130/80 mmHg LDL is not at goal of 55 mg/dL Reviewed long term cardiovascular and renal outcomes of uncontrolled blood sugar Reviewed goal A1c, goal fasting, and goal 2 hour post prandial glucose Reviewed lifestyle modifications including: increase as able Recommend to:  Continue current supply of Ozempic  2 mg weekly Start Mounjaro  5 mg once weekly after finishing Ozpemic Recommend to check glucose as needed Future Considerations:  Consider Mounjaro  titration Discussed side effects of gastrointestinal upset/nausea; eating smaller meals, avoiding high-fat foods, and remaining upright after eating may reduce nausea. Discussed that overeating is a major trigger of nausea with this class of  medications, as often times patients will start to feel full sooner and may need to decrease portion sizes from what they were previously accustomed to.   Follow Up Plan:  PharmD 09/22/24  Jenkins Graces, PharmD PGY1 Pharmacy Resident  Mliss Tarry Griffin, PharmD, BCACP, CPP Clinical Pharmacist, [facility]     "

## 2024-08-18 ENCOUNTER — Other Ambulatory Visit: Admitting: Pharmacist

## 2024-08-18 DIAGNOSIS — G720 Drug-induced myopathy: Secondary | ICD-10-CM

## 2024-08-18 DIAGNOSIS — E119 Type 2 diabetes mellitus without complications: Secondary | ICD-10-CM

## 2024-08-18 DIAGNOSIS — Z7985 Long-term (current) use of injectable non-insulin antidiabetic drugs: Secondary | ICD-10-CM

## 2024-09-08 ENCOUNTER — Ambulatory Visit (HOSPITAL_COMMUNITY): Admission: RE | Admit: 2024-09-08 | Source: Ambulatory Visit

## 2024-09-08 ENCOUNTER — Ambulatory Visit (HOSPITAL_COMMUNITY)

## 2024-09-17 ENCOUNTER — Ambulatory Visit: Payer: Self-pay

## 2024-09-22 ENCOUNTER — Other Ambulatory Visit

## 2024-10-06 ENCOUNTER — Encounter: Payer: Self-pay | Admitting: Family Medicine

## 2024-10-15 ENCOUNTER — Ambulatory Visit

## 2024-11-03 ENCOUNTER — Ambulatory Visit: Admitting: Cardiology
